# Patient Record
Sex: Female | Born: 1980 | Hispanic: No | Marital: Single | Smoking: Never smoker
Health system: Southern US, Community
[De-identification: ages and names within clinical notes are randomized; demographics above are authoritative.]

## PROBLEM LIST (undated history)

## (undated) DIAGNOSIS — K7460 Unspecified cirrhosis of liver: Secondary | ICD-10-CM

## (undated) DIAGNOSIS — F4310 Post-traumatic stress disorder, unspecified: Secondary | ICD-10-CM

## (undated) DIAGNOSIS — F1910 Other psychoactive substance abuse, uncomplicated: Secondary | ICD-10-CM

## (undated) DIAGNOSIS — F1010 Alcohol abuse, uncomplicated: Secondary | ICD-10-CM

## (undated) DIAGNOSIS — K649 Unspecified hemorrhoids: Secondary | ICD-10-CM

## (undated) DIAGNOSIS — D649 Anemia, unspecified: Secondary | ICD-10-CM

## (undated) DIAGNOSIS — B1910 Unspecified viral hepatitis B without hepatic coma: Secondary | ICD-10-CM

## (undated) DIAGNOSIS — K7031 Alcoholic cirrhosis of liver with ascites: Secondary | ICD-10-CM

## (undated) DIAGNOSIS — F419 Anxiety disorder, unspecified: Secondary | ICD-10-CM

## (undated) DIAGNOSIS — F329 Major depressive disorder, single episode, unspecified: Secondary | ICD-10-CM

## (undated) HISTORY — PX: CHOLECYSTECTOMY: SHX55

## (undated) HISTORY — PX: MOUTH SURGERY: SHX715

---

## 2003-04-12 ENCOUNTER — Ambulatory Visit (HOSPITAL_COMMUNITY): Admission: AD | Admit: 2003-04-12 | Discharge: 2003-04-12 | Payer: Self-pay | Admitting: Obstetrics and Gynecology

## 2003-06-21 ENCOUNTER — Inpatient Hospital Stay (HOSPITAL_COMMUNITY): Admission: RE | Admit: 2003-06-21 | Discharge: 2003-06-23 | Payer: Self-pay | Admitting: Obstetrics and Gynecology

## 2004-06-09 ENCOUNTER — Emergency Department (HOSPITAL_COMMUNITY): Admission: EM | Admit: 2004-06-09 | Discharge: 2004-06-09 | Payer: Self-pay | Admitting: Emergency Medicine

## 2006-09-13 ENCOUNTER — Inpatient Hospital Stay (HOSPITAL_COMMUNITY): Admission: AD | Admit: 2006-09-13 | Discharge: 2006-09-15 | Payer: Self-pay | Admitting: Obstetrics and Gynecology

## 2009-01-27 ENCOUNTER — Other Ambulatory Visit: Admission: RE | Admit: 2009-01-27 | Discharge: 2009-01-27 | Payer: Self-pay | Admitting: Unknown Physician Specialty

## 2009-01-27 ENCOUNTER — Encounter (INDEPENDENT_AMBULATORY_CARE_PROVIDER_SITE_OTHER): Payer: Self-pay | Admitting: Unknown Physician Specialty

## 2010-01-05 ENCOUNTER — Emergency Department (HOSPITAL_COMMUNITY): Admission: EM | Admit: 2010-01-05 | Discharge: 2010-01-05 | Payer: Self-pay | Admitting: Emergency Medicine

## 2010-01-06 ENCOUNTER — Ambulatory Visit: Payer: Self-pay | Admitting: Psychiatry

## 2010-01-06 ENCOUNTER — Inpatient Hospital Stay (HOSPITAL_COMMUNITY): Admission: RE | Admit: 2010-01-06 | Discharge: 2010-01-08 | Payer: Self-pay | Admitting: Psychiatry

## 2010-11-15 ENCOUNTER — Other Ambulatory Visit
Admission: RE | Admit: 2010-11-15 | Discharge: 2010-11-15 | Payer: Self-pay | Source: Home / Self Care | Admitting: Obstetrics and Gynecology

## 2010-11-15 ENCOUNTER — Other Ambulatory Visit: Payer: Self-pay | Admitting: Obstetrics and Gynecology

## 2011-01-23 LAB — URINALYSIS, ROUTINE W REFLEX MICROSCOPIC
Bilirubin Urine: NEGATIVE
Glucose, UA: NEGATIVE mg/dL
Ketones, ur: NEGATIVE mg/dL
Leukocytes, UA: NEGATIVE
Nitrite: NEGATIVE
Protein, ur: NEGATIVE mg/dL
Specific Gravity, Urine: 1.025 (ref 1.005–1.030)
Urobilinogen, UA: 0.2 mg/dL (ref 0.0–1.0)
pH: 6 (ref 5.0–8.0)

## 2011-01-23 LAB — CBC
Hemoglobin: 13.9 g/dL (ref 12.0–15.0)
MCHC: 36.7 g/dL — ABNORMAL HIGH (ref 30.0–36.0)
MCV: 91.2 fL (ref 78.0–100.0)
RBC: 4.16 MIL/uL (ref 3.87–5.11)
WBC: 6.7 10*3/uL (ref 4.0–10.5)

## 2011-01-23 LAB — RAPID URINE DRUG SCREEN, HOSP PERFORMED
Amphetamines: NOT DETECTED
Barbiturates: NOT DETECTED
Benzodiazepines: NOT DETECTED
Cocaine: NOT DETECTED
Opiates: NOT DETECTED
Tetrahydrocannabinol: NOT DETECTED

## 2011-01-23 LAB — URINE MICROSCOPIC-ADD ON

## 2011-01-23 LAB — DIFFERENTIAL
Basophils Relative: 1 % (ref 0–1)
Eosinophils Relative: 1 % (ref 0–5)
Lymphs Abs: 2.4 10*3/uL (ref 0.7–4.0)
Monocytes Absolute: 0.7 10*3/uL (ref 0.1–1.0)
Monocytes Relative: 11 % (ref 3–12)

## 2011-01-23 LAB — BASIC METABOLIC PANEL
CO2: 28 mEq/L (ref 19–32)
Chloride: 103 mEq/L (ref 96–112)
Creatinine, Ser: 0.58 mg/dL (ref 0.4–1.2)
Potassium: 3.4 mEq/L — ABNORMAL LOW (ref 3.5–5.1)
Sodium: 138 mEq/L (ref 135–145)

## 2011-01-23 LAB — PREGNANCY, URINE: Preg Test, Ur: NEGATIVE

## 2011-03-18 NOTE — Op Note (Signed)
NAMEMARIDEE, SLAPE               ACCOUNT NO.:  [id number]   MEDICAL RECORD NO.:  [id number]          PATIENT TYPE:  INP   LOCATION:  A402                          FACILITY:  [facility]   PHYSICIAN:  Lazaro Arms, M.D.   DATE OF BIRTH:  02/27/1981   DATE OF PROCEDURE:  09/14/2006  DATE OF DISCHARGE:  09/15/2006                               OPERATIVE REPORT   PROCEDURE:  Epidural placement note.   INDICATIONS:  Alcario Drought is a 30 year old, gravida 3, para 2, at [redacted] weeks  gestation who is admitted for induction of labor for post dates.  The  patient is getting good labor and is requesting an epidural be placed.   DESCRIPTION OF PROCEDURE:  The patient is placed in the sitting  position.  Betadine prep is used and the L3-4 interspace is identified.  Lidocaine 1% is injected.  The area is field draped.  A 17-gauge Tuohy  needle is used and loss-of-resistance technique employed.  The epidural  space is found with one pass without difficulty.  Bupivacaine plain  0.125% , 10 mL, is given into the epidural space as a test dose without  ill effects.  The epidural catheter was then fed without difficulty 10  mL of  0.125% bupivacaine plain is then given to dose up the epidural  and taped down 5 cm in the epidural space. The continuous infusion of  0.125% bupivacaine with 2 mcg per mL of fentanyl is begun at 12 mL per  hour.  The patient is getting good pain relief.  The fetal heart rate  tracing is stable and the blood pressure is stable.      Lazaro Arms, M.D.  Electronically Signed     LHE/MEDQ  D:  11/21/2006  T:  11/22/2006  Job:  [id number]

## 2011-03-18 NOTE — Group Therapy Note (Signed)
NAMEMYLAN, LENGYEL               ACCOUNT NO.:  [id number]   MEDICAL RECORD NO.:  [id number]          PATIENT TYPE:  INP   LOCATION:  LDR4                          FACILITY:  [facility]   PHYSICIAN:  Tilda Burrow, M.D. DATE OF BIRTH:  [birth year]   DATE OF PROCEDURE:  DATE OF DISCHARGE:                                   PROGRESS NOTE   DELIVERY NOTE:  Victoria Gordon progressed nicely through labor after she received her epidural, and  just complained of a tiny bit of pressure, so I checked her and she was  complete at +3 station.  After literally one push, she had a spontaneous  vaginal delivery of a viable female infant at 12:38 p.m.  The mouth and nose  were suctioned on the perineum and the shoulders delivered without  difficulty.  Apgars were 9/9.  Weight was 8 pounds, 10 ounces.  Twenty units  of Pitocin diluted in 1000 cc of lactated Ringer's was infused rapidly IV.  The placenta separated spontaneously and delivered via controlled cord  traction and maternal pushing effort at 12:42.  It was inspected and appears  to be intact with a three-vessel cord.  Minimal lochia was noted.  The  fundus was firm.  The vagina was inspected, and no lacerations were found.  Estimated blood loss 200 cc.  The epidural catheter was then removed with  the blue tip visualized as being intact.      Victoria Gordon, C.N.M.      Tilda Burrow, M.D.  Electronically Signed    FC/MEDQ  D:  09/14/2006  T:  09/14/2006  Job:  [id number]   cc:   [facility]   Francoise Schaumann. Milford Cage DO, FAAP  Fax: [phone]

## 2011-03-18 NOTE — H&P (Signed)
NAMELIDDY, DEAM               ACCOUNT NO.:  [id number]   MEDICAL RECORD NO.:  [id number]          PATIENT TYPE:  INP   LOCATION:  A402                          FACILITY:  [facility]   PHYSICIAN:  Tilda Burrow, M.D. DATE OF BIRTH:  [birth year]   DATE OF ADMISSION:  DATE OF DISCHARGE:  LH                                HISTORY & PHYSICAL   CHIEF COMPLAINT:  Induction of labor secondary to post dates.   HISTORY OF PRESENT ILLNESS:  Victoria Gordon is a gravida 3, para 2 with an EDC of  September 07, 2006 based on last menstrual period and correlating 18-week  ultrasound.  She began prenatal care in her second trimester and has had  regular visits since then.  She has gained a total of 40 pounds with  appropriate fundal height growth.  Blood pressures have been 100s to  130s/50s to 80s.  Prenatal labs include blood type O positive, rubella  immune.  HBsAg, HIV, HSV, RPR, gonorrhea, Chlamydia and Group B strep were  all negative.  A one hour GTT was elevated at 148 with a subsequently normal  3 hour GTT.   PAST MEDICAL HISTORY:  None.   PAST SURGICAL HISTORY:  No surgical history.   ALLERGIES:  NO KNOWN DRUG ALLERGIES.   SOCIAL HISTORY:  Single.  Father of the baby is involved.  Denies cigarette  smoking, alcohol or drug use.   FAMILY HISTORY:  No contributing family history.   OBSTETRICAL HISTORY:  Had induced vaginal deliveries in 2001 and 2004 of  female infants around 7 pounds each without difficulty.   PHYSICAL EXAMINATION:  HEENT: Within normal limits.  HEART:  Regular rate and rhythm.  LUNGS:  Clear.  ABDOMEN:  Soft and nontender.  Fundal height is 41 cm.  Estimated fetal  weight about 8 pounds.  Cervical examination-2 cm dilated, 50% effaced, -1  to -2 station and posterior.  Legs are negative.   IMPRESSION:  IUP at 41 weeks, induction of labor secondary to post dates.   PLAN:  Will admit on the evening of September 13, 2006 for a Foley pre  induction and AROM and Pitocin in the  morning.      Jacklyn Shell, C.N.M.      Tilda Burrow, M.D.  Electronically Signed    FC/MEDQ  D:  09/12/2006  T:  09/12/2006  Job:  [id number]   cc:   Francoise Schaumann. Milford Cage DO, FAAP  Fax: [phone]

## 2011-03-18 NOTE — Op Note (Signed)
   NAMEGerald Leitz                         ACCOUNT NO.:  [id number]   MEDICAL RECORD NO.:  [id number]                   PATIENT TYPE:  INP   LOCATION:  LDR2                                 FACILITY:  [facility]   PHYSICIAN:  Tilda Burrow, M.D.              DATE OF BIRTH:  September 23, 1981   DATE OF PROCEDURE:  DATE OF DISCHARGE:                                 OPERATIVE REPORT   DELIVERY TIME:  1418, 2004, on August 21.   PROCEDURE:  [name] progressed after beginning induction at 6 a.m.,  progressing nicely to 4 cm dilated at which time amniotomy was performed.  She progressed smoothly to spontaneous vertex vaginal delivery.  At 2:18  p.m. from direct OA position, a healthy female infant, Apgars 9 and 9,  weight 7 pounds 4 ounce.  Placenta delivered easily, Schultze presentation.  Median second-degree laceration was repaired under local anesthesia.  The  patient tolerated the procedure well.                                               Tilda Burrow, M.D.    JVF/MEDQ  D:  06/21/2003  T:  06/21/2003  Job:  [id number]   cc:   [facility]   Gardiner Barefoot, M.D.  [address]  [place]  [place] [address]  Fax: [phone]

## 2011-03-18 NOTE — H&P (Signed)
   NAMEStevie Gordon                           ACCOUNT NO.:  [id number]   MEDICAL RECORD NO.:  [id number]                   PATIENT TYPE:   LOCATION:                                       FACILITY:   PHYSICIAN:  Tilda Burrow, M.D.              DATE OF BIRTH:   DATE OF ADMISSION:  DATE OF DISCHARGE:                                HISTORY & PHYSICAL   ADMITTING DIAGNOSIS:  Pregnancy [redacted] weeks gestation, elective induction.   HISTORY OF PRESENT ILLNESS:  This 30 year old female, gravida 3, para 1, AB  1, LMP September 14, 2002 placing menstrual EDC of June 22, 2003 with first  ultrasound suggesting an [facility] of June 22, 2003, as well, based on a midterm  menstrual ultrasound is admitted after a pregnancy followed through our  office after a late presentation May 21 which she was 26 weeks 4 days by  gestational criteria. She is admitted, at this time, for induction with  cervix 3 cm, soft, minus 2, posterior position. Membranes have been ruptured  at 9:30 a.m.  Potential for vaginal delivery is considered good.   PRIOR OBSTETRICAL HISTORY:  A 3 hour labor from induction after a Foley bulb  cervical ripening.   PAST MEDICAL HISTORY:  Benign.   PAST SURGICAL HISTORY:  D&C after spontaneous AB.   ALLERGIES:  None.   MEDICATIONS:  Prenatal vitamins.   SOCIAL HISTORY:  Single, lives with the baby's father a Mr. Victoria Gordon who  is having his first child.   PHYSICAL EXAMINATION:  GENERAL: Reveals a healthy appearing, Hispanic  female, alert, oriented x3 with good [place] skills.  VITAL SIGNS:  Height 5 feet 4 inches; weight 202.  Blood pressure 120/80.  ABDOMEN:  Fundal height shows a term size fetus, vertex presentation,  estimated fetal weight 7 pounds.  Cervix 3 cm, 50%, minus 2, posterior  vertex presentation confirmed.  Amniotomy shows light bloody fluid.   PLAN:  Pitocin induction of labor with good prognosis for vaginal delivery.              Tilda Burrow, M.D.    JVF/MEDQ  D:  06/21/2003  T:  06/21/2003  Job:  [id number]   cc:   [facility] Department   Victoria Gordon, M.D.  Victoria Gordon. [address]  [place]  [place] [address]  Fax: [phone]

## 2011-04-20 ENCOUNTER — Encounter (HOSPITAL_COMMUNITY): Payer: Self-pay | Admitting: *Deleted

## 2011-04-20 ENCOUNTER — Inpatient Hospital Stay (HOSPITAL_COMMUNITY)
Admission: AD | Admit: 2011-04-20 | Payer: Medicaid Other | Source: Ambulatory Visit | Admitting: Obstetrics & Gynecology

## 2011-04-20 ENCOUNTER — Observation Stay (HOSPITAL_COMMUNITY)
Admission: AD | Admit: 2011-04-20 | Discharge: 2011-04-20 | Disposition: A | Discharge status: Home / Self Care | Payer: Medicaid Other | Source: Ambulatory Visit | Attending: Obstetrics & Gynecology | Admitting: Obstetrics & Gynecology

## 2011-04-20 DIAGNOSIS — O321XX0 Maternal care for breech presentation, not applicable or unspecified: Principal | ICD-10-CM | POA: Insufficient documentation

## 2011-04-22 ENCOUNTER — Encounter (HOSPITAL_COMMUNITY)
Admission: RE | Admit: 2011-04-22 | Discharge: 2011-04-22 | Disposition: A | Discharge status: Home / Self Care | Payer: Medicaid Other | Source: Ambulatory Visit | Attending: Internal Medicine | Admitting: Internal Medicine

## 2011-04-22 LAB — CBC
HCT: 33 % — ABNORMAL LOW (ref 36.0–46.0)
Hemoglobin: 10.8 g/dL — ABNORMAL LOW (ref 12.0–15.0)
MCHC: 32.7 g/dL (ref 30.0–36.0)
MCV: 84.6 fL (ref 78.0–100.0)
WBC: 8.6 10*3/uL (ref 4.0–10.5)

## 2011-04-22 LAB — SURGICAL PCR SCREEN: Staphylococcus aureus: NEGATIVE

## 2011-04-22 LAB — RPR: RPR Ser Ql: NONREACTIVE

## 2011-04-24 ENCOUNTER — Other Ambulatory Visit: Payer: Self-pay | Admitting: Obstetrics & Gynecology

## 2011-04-24 ENCOUNTER — Inpatient Hospital Stay (HOSPITAL_COMMUNITY)
Admission: RE | Admit: 2011-04-24 | Discharge: 2011-04-26 | DRG: 766 | Disposition: A | Discharge status: Home / Self Care | Payer: Medicaid Other | Source: Ambulatory Visit | Attending: Obstetrics & Gynecology | Admitting: Obstetrics & Gynecology

## 2011-04-24 DIAGNOSIS — O321XX0 Maternal care for breech presentation, not applicable or unspecified: Principal | ICD-10-CM | POA: Diagnosis present

## 2011-04-24 DIAGNOSIS — Z01818 Encounter for other preprocedural examination: Secondary | ICD-10-CM

## 2011-04-24 DIAGNOSIS — O9903 Anemia complicating the puerperium: Secondary | ICD-10-CM | POA: Diagnosis not present

## 2011-04-24 DIAGNOSIS — D649 Anemia, unspecified: Secondary | ICD-10-CM | POA: Diagnosis not present

## 2011-04-24 DIAGNOSIS — Z01812 Encounter for preprocedural laboratory examination: Secondary | ICD-10-CM

## 2011-04-24 LAB — CBC
HCT: 32.3 % — ABNORMAL LOW (ref 36.0–46.0)
Hemoglobin: 10.8 g/dL — ABNORMAL LOW (ref 12.0–15.0)
RBC: 3.85 MIL/uL — ABNORMAL LOW (ref 3.87–5.11)

## 2011-04-24 LAB — RPR: RPR Ser Ql: NONREACTIVE

## 2011-04-25 LAB — CBC
HCT: 26.5 % — ABNORMAL LOW (ref 36.0–46.0)
Hemoglobin: 8.8 g/dL — ABNORMAL LOW (ref 12.0–15.0)
MCV: 84.1 fL (ref 78.0–100.0)
WBC: 10.1 10*3/uL (ref 4.0–10.5)

## 2011-04-27 NOTE — Op Note (Addendum)
Victoria Gordon, Victoria Gordon               ACCOUNT NO.:  [id number]  MEDICAL RECORD NO.:  [id number]  LOCATION:                                 FACILITY:  PHYSICIAN:  Lazaro Arms, M.D.   DATE OF BIRTH:  11-Dec-1980  DATE OF PROCEDURE:  04/24/2011 DATE OF DISCHARGE:                              OPERATIVE REPORT   PREOPERATIVE DIAGNOSES: 1. Intrauterine pregnancy 39 weeks' gestation. 2. Breech/back anterior transverse lie. 3. Failed version attempt earlier in the week. 4. Spontaneous rupture of membranes.  POSTOPERATIVE DIAGNOSES: 1. Intrauterine pregnancy 39 weeks' gestation. 2. Breech/back anterior transverse lie. 3. Failed version attempt earlier in the week. 4. Spontaneous rupture of membranes.  PROCEDURE:  Primary low transverse cesarean resection.  SURGEON:  Lazaro Arms, MD  ANESTHESIA:  Spinal.  FINDINGS:  The patient came to the ER Maternity Admission Unit today with spontaneous rupture of membranes to the external cephalic version attempt last Wednesday on the 20th which was unsuccessful and scheduled her for C-section on the 27th, however, she presented today for rupture of membranes which was confirmed. Today on ultrasound the baby is actually back anterior, transverse lie.  At the time of surgery that was confirmed.  We delivered a viable female infant at 67, 13 with Apgar's of 8 and 9 with cord pH 7.30, weight to be determined at nursery.  Three-vessel cord.  Cord blood was normal. Uterus, tubes and ovaries normal.  DESCRIPTION OF OPERATION:  The patient was taken to the operating room, placed in sitting position where she underwent spinal anesthetic.  She was then placed in supine position with tilt to the left.  She was prepped and draped in usual sterile fashion.  Pfannenstiel's skin incision was made, carried down sharply to rectus fascia, scored in midline, extended laterally.  The fascia was taken off the muscles superiorly and inferiorly without  difficulty.  The muscles were divided. Peritoneal cavity was entered.  Bladder blade was placed.  Low- transverse hysterotomy incision was made and over this incision was delivered a viable female infant at 55, 22 with Apgar's of 8 and 9, weight to be determined at nursery.  Cord pH 7.30. Estimated blood loss was 1000 mL.  The infant underwent routine neonatal resuscitation by Dr. Eric Form.  The uterus was exteriorized, closed in 2 layers, first being running interlocking layer, the second being imbricating layer and additional interrupted figure-of-eight sutures.  Has large venous complex in the area of the incision.  There was quite heavy bleeding at this point, so the blood loss was 1000 mL but there was good hemostasis at the closure.  The muscles and peritoneum reapproximated loosely.  The fascia was closed using 0 Vicryl running.  The subcutaneous tissue was made hemostatic and irrigated.  Skin was closed using 4-0 Vicryl in a Keith needle subcuticular fashion and Dermabond. The patient tolerated the procedure well.  She experienced 1000 mL of blood loss, taken to recovery room in good stable condition.  All counts correct.  She received Ancef prophylactically.     Lazaro Arms, M.D.     Loraine Maple  D:  04/24/2011  T:  04/25/2011  Job:  [id number]  Electronically  Signed by Duane Lope M.D. on 05/03/2011 10:29:30 AM

## 2011-05-06 NOTE — H&P (Signed)
  NAMEJNIYA, Victoria Gordon               ACCOUNT NO.:  [id number]  MEDICAL RECORD NO.:  [id number]  LOCATION:  9172                          FACILITY:  [facility]  PHYSICIAN:  Lazaro Arms, M.D.   DATE OF BIRTH:  12/16/1980  DATE OF ADMISSION:  04/20/2011 DATE OF DISCHARGE:  04/20/2011                             HISTORY & PHYSICAL   [name] is a gravida 4, para 3, at [redacted] weeks gestation, who was found to be in the breech presentation in the office.  She is brought in to the hospital to undergo an external cephalic version.  The patient understands the risk of need for emergency C-section and she is being kept n.p.o. for the past 8 hours.  She has a reactive NST pre procedure. She is still in the breech position that is confirmed.  It really is a back anterior head up oblique lie with a head up under the liver, the back anterior, and the butt is in the left lower quadrant.  The patient was given two doses of terbutaline to relax the uterus.  Her pulse is over 100.  Using the ultrasound and a lot of ultrasound jelly, I manually attempted x3 to rotate the baby without success.  Fetal heart rate was good in between these ultrasound to confirm that after three attempts ceased and placed the patient back on the monitor for an hour and a half monitoring.  Monitoring show was completely reactive with no variable decelerations, no decelerations at all, and the patient felt well.  As a result, she was discharged home to follow up in the office. I put her on for a C-section on Wednesday, June 27 for C-section because of persistent non-vertex presentation.  The patient and her husband understand and all arrangements were made and she will be followed up in the office on Friday.     Lazaro Arms, M.D.     Loraine Maple  D:  05/04/2011  T:  05/05/2011  Job:  [id number]  Electronically Signed by Duane Lope M.D. on 05/06/2011 12:23:29 PM

## 2011-05-10 NOTE — Discharge Summary (Signed)
  Victoria Gordon, Victoria Gordon               ACCOUNT NO.:  [id number]  MEDICAL RECORD NO.:  [id number]  LOCATION:  9146                          FACILITY:  [facility]  PHYSICIAN:  Scheryl Darter, MD       DATE OF BIRTH:  02/04/1981  DATE OF ADMISSION:  04/24/2011 DATE OF DISCHARGE:  04/26/2011                              DISCHARGE SUMMARY   ADMITTING DIAGNOSES: 1. Intrauterine pregnancy at 38-6/7 weeks. 2. Spontaneous rupture of membranes. 3. Breech/transverse lie.  DISCHARGE DIAGNOSES: 1. Intrauterine pregnancy at 38-6/7 weeks. 2. Spontaneous rupture of membranes. 3. Breech/transverse lie. 4. Anemia.  PROCEDURE:  Primary low transverse cesarean section.  HOSPITAL COURSE:  Victoria Gordon is a 30 year old gravida 4, para 3-0-3-3 who presented at 38-6/[redacted] weeks gestation with spontaneous rupture of membranes and breech/transverse lie of the fetus.  Her pregnancy has been followed by the [facility].  She has had an essentially uneventful pregnancy course with 3 vaginal deliveries in the past.  The patient was taken to the operating room for cesarean section by Dr. Despina Hidden.  Infant was a viable female born at 65 on April 24, 2011, with Apgars of 8 and 9, weight of 7 pounds 12 ounces and cord pH of 7.30 and he was taken to the full-term nursery in good condition. The patient tolerated the remainder of the cesarean section well and was taken to the recovery room.  By postop day #1, the patient was doing well.  Her vital signs were stable.  Her hemoglobin was 8.8 and hematocrit was 26.5, white blood cell count was 10.1 and platelets were 172,000.  Preoperatively, her hemoglobin was 10.8, hematocrit 32.12, white blood cell count 10.2 with platelets 225,000. The patient was having no signs and symptoms of anemia.  She was bottle feeding and desired Implanon for contraception.  By postop day #2, she continued to do well.  Physical exam was within normal limits.  Vital signs were stable.  She  was deemed to have received the full benefit of her hospital stay and she was discharged home.  DISCHARGE INSTRUCTIONS:  Per postpartum handout.  DISCHARGE MEDICATIONS: 1. Motrin 600 mg one p.o. q.6 h p.r.n. pain. 2. Percocet 5/325 one tablet every 4 hours p.r.n. severe pain. 3. Prenatal vitamin one p.o. day.  DISCHARGE FOLLOWUP:  It will occur at [facility] in 4-6 weeks, postpartum or sooner as needed.     Cam Hai, C.N.M.   ______________________________ Scheryl Darter, MD    KS/MEDQ  D:  04/26/2011  T:  04/26/2011  Job:  [id number]  Electronically Signed by Cam Hai C.N.M. on 04/28/2011 01:13:46 AM Electronically Signed by Scheryl Darter MD on 05/10/2011 12:31:33 PM

## 2013-07-11 ENCOUNTER — Other Ambulatory Visit: Payer: Self-pay

## 2013-07-15 ENCOUNTER — Other Ambulatory Visit: Payer: Self-pay | Admitting: Obstetrics & Gynecology

## 2013-07-15 ENCOUNTER — Ambulatory Visit (INDEPENDENT_AMBULATORY_CARE_PROVIDER_SITE_OTHER): Payer: BC Managed Care – PPO

## 2013-07-15 ENCOUNTER — Encounter: Payer: Self-pay | Admitting: Obstetrics & Gynecology

## 2013-07-15 DIAGNOSIS — O09299 Supervision of pregnancy with other poor reproductive or obstetric history, unspecified trimester: Secondary | ICD-10-CM

## 2013-07-15 DIAGNOSIS — O3680X0 Pregnancy with inconclusive fetal viability, not applicable or unspecified: Secondary | ICD-10-CM

## 2013-07-15 DIAGNOSIS — O26849 Uterine size-date discrepancy, unspecified trimester: Secondary | ICD-10-CM

## 2013-07-15 NOTE — Progress Notes (Signed)
U/S-transvaginal and transabdominal u/s performed, single intrauterine gestational sac noted with +YS, no fetal pole noted today, GS meas c/w 6+2wks, bilateral adnexa wnl with C.L. Noted on RT, no free fluid noted, cx long and closed, reviewed with Cyril Mourning, NP she recommended to perform a follow up u/s in 1 week for viability.

## 2013-07-16 ENCOUNTER — Other Ambulatory Visit: Payer: Self-pay | Admitting: Obstetrics & Gynecology

## 2013-07-16 DIAGNOSIS — O3680X0 Pregnancy with inconclusive fetal viability, not applicable or unspecified: Secondary | ICD-10-CM

## 2013-07-23 ENCOUNTER — Other Ambulatory Visit: Payer: Self-pay

## 2013-07-30 ENCOUNTER — Ambulatory Visit (INDEPENDENT_AMBULATORY_CARE_PROVIDER_SITE_OTHER): Payer: BC Managed Care – PPO | Admitting: Obstetrics & Gynecology

## 2013-07-30 ENCOUNTER — Ambulatory Visit (INDEPENDENT_AMBULATORY_CARE_PROVIDER_SITE_OTHER): Payer: BC Managed Care – PPO

## 2013-07-30 ENCOUNTER — Encounter: Payer: Self-pay | Admitting: Obstetrics & Gynecology

## 2013-07-30 ENCOUNTER — Other Ambulatory Visit: Payer: Self-pay | Admitting: Obstetrics & Gynecology

## 2013-07-30 VITALS — BP 130/80 | Wt 182.0 lb

## 2013-07-30 VITALS — Ht 62.0 in

## 2013-07-30 DIAGNOSIS — O021 Missed abortion: Secondary | ICD-10-CM | POA: Insufficient documentation

## 2013-07-30 DIAGNOSIS — Z331 Pregnant state, incidental: Secondary | ICD-10-CM

## 2013-07-30 DIAGNOSIS — O200 Threatened abortion: Secondary | ICD-10-CM

## 2013-07-30 DIAGNOSIS — O3680X0 Pregnancy with inconclusive fetal viability, not applicable or unspecified: Secondary | ICD-10-CM

## 2013-07-30 DIAGNOSIS — Z1389 Encounter for screening for other disorder: Secondary | ICD-10-CM

## 2013-07-30 LAB — POCT URINALYSIS DIPSTICK
Glucose, UA: NEGATIVE
Ketones, UA: NEGATIVE

## 2013-07-30 NOTE — Progress Notes (Signed)
Patient ID: Victoria Gordon, female   DOB: [birth year], 32 y.o.   MRN: [id number] Victoria Gordon comes in for a followup after her sonogram today She had an ultrasound on September 15 which revealed an intrauterine pregnancy with the otic sac but was inconsistent with her dates as a result we saw her back for a repeat sonogram today The ultrasound today actually has a smaller sac in an no progression of the pregnancy at all  As a result this represents a missed miscarriage It is a small sac and a D&C or Cytotec or needed  She's been having some spotting since last Friday not any heavy bleeding

## 2013-10-31 NOTE — L&D Delivery Note (Signed)
I was present for the entire delivery and repair and agree with above. Placenta to: BS Feeding: Br Circ: NA Contraception: BLT NPO  [place], CNM 07/22/2014 10:59 AM

## 2013-10-31 NOTE — L&D Delivery Note (Signed)
Delivery Note At 9:50 AM a viable and healthy female was delivered via Vaginal, Spontaneous Delivery (Presentation: Left Occiput Anterior).  APGAR: 9, 9; weight pending post skin to skin .   Placenta status:  Delivered Spontaneous shultz.  Cord: 3 vessels with the following complications: None.  Anesthesia: Epidural  Episiotomy:  Lacerations: 1st degree;Vaginal Suture Repair: 3.0 vicryl Est. Blood Loss (mL): 300  Mom to postpartum.  Baby to Couplet care / Skin to Skin.  Doylene Bode [facility] 07/22/2014, 10:44 AM

## 2014-01-07 ENCOUNTER — Other Ambulatory Visit: Payer: Self-pay | Admitting: Obstetrics and Gynecology

## 2014-01-07 DIAGNOSIS — O3680X0 Pregnancy with inconclusive fetal viability, not applicable or unspecified: Secondary | ICD-10-CM

## 2014-01-09 ENCOUNTER — Other Ambulatory Visit: Payer: Self-pay | Admitting: Obstetrics & Gynecology

## 2014-01-09 ENCOUNTER — Other Ambulatory Visit: Payer: BC Managed Care – PPO

## 2014-01-09 ENCOUNTER — Ambulatory Visit (INDEPENDENT_AMBULATORY_CARE_PROVIDER_SITE_OTHER): Payer: BC Managed Care – PPO

## 2014-01-09 ENCOUNTER — Encounter (INDEPENDENT_AMBULATORY_CARE_PROVIDER_SITE_OTHER): Payer: Self-pay

## 2014-01-09 DIAGNOSIS — Z3480 Encounter for supervision of other normal pregnancy, unspecified trimester: Secondary | ICD-10-CM

## 2014-01-09 DIAGNOSIS — Z36 Encounter for antenatal screening of mother: Secondary | ICD-10-CM

## 2014-01-09 DIAGNOSIS — O3680X0 Pregnancy with inconclusive fetal viability, not applicable or unspecified: Secondary | ICD-10-CM

## 2014-01-09 NOTE — Progress Notes (Signed)
U/S - 13+2wks- single active fetus, CRL c/w LMP dates, FHR-149 bpm, cx appears closed, bilateral adnexa appears WNL, posterior placenta noted, pt desires NT/IT screen, NB present, NT-1.76mm

## 2014-01-17 LAB — MATERNAL SCREEN, INTEGRATED #1

## 2014-01-21 ENCOUNTER — Encounter: Payer: BC Managed Care – PPO | Admitting: Adult Health

## 2014-02-04 ENCOUNTER — Encounter: Payer: BC Managed Care – PPO | Admitting: Adult Health

## 2014-02-17 ENCOUNTER — Encounter: Payer: Self-pay | Admitting: Women's Health

## 2014-02-17 ENCOUNTER — Ambulatory Visit (INDEPENDENT_AMBULATORY_CARE_PROVIDER_SITE_OTHER): Payer: BC Managed Care – PPO | Admitting: Women's Health

## 2014-02-17 ENCOUNTER — Other Ambulatory Visit (HOSPITAL_COMMUNITY)
Admission: RE | Admit: 2014-02-17 | Discharge: 2014-02-17 | Disposition: A | Discharge status: Home / Self Care | Payer: BC Managed Care – PPO | Source: Ambulatory Visit | Attending: Adult Health | Admitting: Adult Health

## 2014-02-17 VITALS — BP 110/70 | Wt 206.0 lb

## 2014-02-17 DIAGNOSIS — O99210 Obesity complicating pregnancy, unspecified trimester: Secondary | ICD-10-CM

## 2014-02-17 DIAGNOSIS — Z113 Encounter for screening for infections with a predominantly sexual mode of transmission: Secondary | ICD-10-CM | POA: Insufficient documentation

## 2014-02-17 DIAGNOSIS — O99019 Anemia complicating pregnancy, unspecified trimester: Secondary | ICD-10-CM

## 2014-02-17 DIAGNOSIS — E669 Obesity, unspecified: Secondary | ICD-10-CM

## 2014-02-17 DIAGNOSIS — O09299 Supervision of pregnancy with other poor reproductive or obstetric history, unspecified trimester: Secondary | ICD-10-CM

## 2014-02-17 DIAGNOSIS — O2342 Unspecified infection of urinary tract in pregnancy, second trimester: Secondary | ICD-10-CM | POA: Insufficient documentation

## 2014-02-17 DIAGNOSIS — Z3480 Encounter for supervision of other normal pregnancy, unspecified trimester: Secondary | ICD-10-CM | POA: Insufficient documentation

## 2014-02-17 DIAGNOSIS — Z331 Pregnant state, incidental: Secondary | ICD-10-CM

## 2014-02-17 DIAGNOSIS — Z1151 Encounter for screening for human papillomavirus (HPV): Secondary | ICD-10-CM | POA: Insufficient documentation

## 2014-02-17 DIAGNOSIS — O2390 Unspecified genitourinary tract infection in pregnancy, unspecified trimester: Secondary | ICD-10-CM

## 2014-02-17 DIAGNOSIS — Z01419 Encounter for gynecological examination (general) (routine) without abnormal findings: Secondary | ICD-10-CM | POA: Insufficient documentation

## 2014-02-17 DIAGNOSIS — O0930 Supervision of pregnancy with insufficient antenatal care, unspecified trimester: Secondary | ICD-10-CM

## 2014-02-17 DIAGNOSIS — Z1389 Encounter for screening for other disorder: Secondary | ICD-10-CM

## 2014-02-17 DIAGNOSIS — O34219 Maternal care for unspecified type scar from previous cesarean delivery: Secondary | ICD-10-CM | POA: Insufficient documentation

## 2014-02-17 LAB — POCT URINALYSIS DIPSTICK
Glucose, UA: NEGATIVE
KETONES UA: NEGATIVE
Leukocytes, UA: NEGATIVE
Nitrite, UA: POSITIVE
PROTEIN UA: NEGATIVE

## 2014-02-17 LAB — CBC
HCT: 32 % — ABNORMAL LOW (ref 36.0–46.0)
Hemoglobin: 11.4 g/dL — ABNORMAL LOW (ref 12.0–15.0)
MCH: 32.9 pg (ref 26.0–34.0)
MCHC: 35.6 g/dL (ref 30.0–36.0)
MCV: 92.2 fL (ref 78.0–100.0)
PLATELETS: 225 10*3/uL (ref 150–400)
RBC: 3.47 MIL/uL — ABNORMAL LOW (ref 3.87–5.11)
RDW: 13.6 % (ref 11.5–15.5)
WBC: 9.8 10*3/uL (ref 4.0–10.5)

## 2014-02-17 MED ORDER — NITROFURANTOIN MONOHYD MACRO 100 MG PO CAPS: 100.0000 mg | ORAL_CAPSULE | Freq: Two times a day (BID) | ORAL | Status: DC

## 2014-02-17 NOTE — Patient Instructions (Signed)
Second Trimester of Pregnancy The second trimester is from week 13 through week 28, months 4 through 6. The second trimester is often a time when you feel your best. Your body has also adjusted to being pregnant, and you begin to feel better physically. Usually, morning sickness has lessened or quit completely, you may have more energy, and you may have an increase in appetite. The second trimester is also a time when the fetus is growing rapidly. At the end of the sixth month, the fetus is about 9 inches long and weighs about 1 pounds. You will likely begin to feel the baby move (quickening) between 18 and 20 weeks of the pregnancy. BODY CHANGES Your body goes through many changes during pregnancy. The changes vary from woman to woman.   Your weight will continue to increase. You will notice your lower abdomen bulging out.  You may begin to get stretch marks on your hips, abdomen, and breasts.  You may develop headaches that can be relieved by medicines approved by your caregiver.  You may urinate more often because the fetus is pressing on your bladder.  You may develop or continue to have heartburn as a result of your pregnancy.  You may develop constipation because certain hormones are causing the muscles that push waste through your intestines to slow down.  You may develop hemorrhoids or swollen, bulging veins (varicose veins).  You may have back pain because of the weight gain and pregnancy hormones relaxing your joints between the bones in your pelvis and as a result of a shift in weight and the muscles that support your balance.  Your breasts will continue to grow and be tender.  Your gums may bleed and may be sensitive to brushing and flossing.  Dark spots or blotches (chloasma, mask of pregnancy) may develop on your face. This will likely fade after the baby is born.  A dark line from your belly button to the pubic area (linea nigra) may appear. This will likely fade after the  baby is born. WHAT TO EXPECT AT YOUR PRENATAL VISITS During a routine prenatal visit:  You will be weighed to make sure you and the fetus are growing normally.  Your blood pressure will be taken.  Your abdomen will be measured to track your baby's growth.  The fetal heartbeat will be listened to.  Any test results from the previous visit will be discussed. Your caregiver may ask you:  How you are feeling.  If you are feeling the baby move.  If you have had any abnormal symptoms, such as leaking fluid, bleeding, severe headaches, or abdominal cramping.  If you have any questions. Other tests that may be performed during your second trimester include:  Blood tests that check for:  Low iron levels (anemia).  Gestational diabetes (between 24 and 28 weeks).  Rh antibodies.  Urine tests to check for infections, diabetes, or protein in the urine.  An ultrasound to confirm the proper growth and development of the baby.  An amniocentesis to check for possible genetic problems.  Fetal screens for spina bifida and Down syndrome. HOME CARE INSTRUCTIONS   Avoid all smoking, herbs, alcohol, and unprescribed drugs. These chemicals affect the formation and growth of the baby.  Follow your caregiver's instructions regarding medicine use. There are medicines that are either safe or unsafe to take during pregnancy.  Exercise only as directed by your caregiver. Experiencing uterine cramps is a good sign to stop exercising.  Continue to eat regular,   healthy meals.  Wear a good support bra for breast tenderness.  Do not use hot tubs, steam rooms, or saunas.  Wear your seat belt at all times when driving.  Avoid raw meat, uncooked cheese, cat litter boxes, and soil used by cats. These carry germs that can cause birth defects in the baby.  Take your prenatal vitamins.  Try taking a stool softener (if your caregiver approves) if you develop constipation. Eat more high-fiber foods,  such as fresh vegetables or fruit and whole grains. Drink plenty of fluids to keep your urine clear or pale yellow.  Take warm sitz baths to soothe any pain or discomfort caused by hemorrhoids. Use hemorrhoid cream if your caregiver approves.  If you develop varicose veins, wear support hose. Elevate your feet for 15 minutes, 3 4 times a day. Limit salt in your diet.  Avoid heavy lifting, wear low heel shoes, and practice good posture.  Rest with your legs elevated if you have leg cramps or low back pain.  Visit your dentist if you have not gone yet during your pregnancy. Use a soft toothbrush to brush your teeth and be gentle when you floss.  A sexual relationship may be continued unless your caregiver directs you otherwise.  Continue to go to all your prenatal visits as directed by your caregiver. SEEK MEDICAL CARE IF:   You have dizziness.  You have mild pelvic cramps, pelvic pressure, or nagging pain in the abdominal area.  You have persistent nausea, vomiting, or diarrhea.  You have a bad smelling vaginal discharge.  You have pain with urination. SEEK IMMEDIATE MEDICAL CARE IF:   You have a fever.  You are leaking fluid from your vagina.  You have spotting or bleeding from your vagina.  You have severe abdominal cramping or pain.  You have rapid weight gain or loss.  You have shortness of breath with chest pain.  You notice sudden or extreme swelling of your face, hands, ankles, feet, or legs.  You have not felt your baby move in over an hour.  You have severe headaches that do not go away with medicine.  You have vision changes. Document Released: 10/11/2001 Document Revised: 06/19/2013 Document Reviewed: 12/18/2012 [employer] Patient Information 2014 [employer].  

## 2014-02-17 NOTE — Progress Notes (Signed)
  Subjective:  Victoria Gordon is a 33 y.o. [phone] Hispanic female at [redacted]w[redacted]d by LMP c/w 13wk u/s, being seen today for her first obstetrical visit.  Her obstetrical history is significant for obesity, term uncomplicated SVD x 3, then PLTCS w/ last baby d/t transverse presentation w/ failed ECV.  Pregnancy history fully reviewed.  Patient reports no complaints. Denies vb, cramping, uti s/s, abnormal/malodorous vag d/c, or vulvovaginal itching/irritation.  BP 110/70  Wt 206 lb (93.441 kg)  LMP 10/08/2013  HISTORY: OB History  Gravida Para Term Preterm AB SAB TAB Ectopic Multiple Living  7 4 4  0 2 2 0 0 0 4    # Outcome Date GA Lbr Len/2nd Weight Sex Delivery Anes PTL Lv  7 CUR           6 SAB 07/2013          5 TRM 04/24/11 [redacted]w[redacted]d  8 lb (3.629 kg) M LTCS EPI  Y  4 TRM 09/14/06 [redacted]w[redacted]d  7 lb 4 oz (3.289 kg) F SVD EPI  Y  3 TRM 06/21/03 [redacted]w[redacted]d  7 lb 12 oz (3.515 kg) F SVD EPI  Y  2 TRM 12/28/99 [redacted]w[redacted]d  7 lb (3.175 kg) F SVD None  Y  1 SAB 2000             History reviewed. No pertinent past medical history. Past Surgical History  Procedure Laterality Date  . Cesarean section    . Mouth surgery     History reviewed. No pertinent family history.  Exam   System:     General: Well developed & nourished, no acute distress   Skin: Warm & dry, normal coloration and turgor, no rashes   Neurologic: Alert & oriented, normal mood   Cardiovascular: Regular rate & rhythm   Respiratory: Effort & rate normal, LCTAB, acyanotic   Abdomen: Soft, non tender   Extremities: normal strength, tone   Pelvic Exam:    Perineum: Normal perineum   Vulva: Normal, no lesions   Vagina:  Normal mucosa, normal discharge   Cervix: Normal, bulbous, appears closed   Uterus: Normal size/shape/contour for GA   Thin prep pap smear obtained w/ high risk HPV cotesting FHR: 144 via doppler   Assessment:   Pregnancy: [id number] Patient Active Problem List   Diagnosis Date Noted  . Supervision of other  normal pregnancy 02/17/2014    Priority: High  . Previous cesarean delivery affecting pregnancy, antepartum 02/17/2014    Priority: High  . Missed abortion 07/30/2013    [redacted]w[redacted]d [id number] New OB visit Obesity Term SVD x 3 Previous c/s w/ 4th baby d/t transverse position/failed ECV    Plan:  Initial labs drawn Continue prenatal vitamins Problem list reviewed and updated Reviewed n/v relief measures and warning s/s to report Reviewed recommended weight gain based on pre-gravid BMI Encouraged well-balanced diet Genetic Screening discussed Integrated Screen: had done 1st IT few weeks ago, 2nd IT today Cystic fibrosis screening discussed requested Ultrasound discussed; fetal survey: requested Follow up in 2 weeks for anatomy u/s and visit [place] completed Discussed VBAC, consent given to take home and review  [place], [facility] 02/17/2014 2:59 PM

## 2014-02-18 ENCOUNTER — Encounter: Payer: Self-pay | Admitting: Women's Health

## 2014-02-18 DIAGNOSIS — O09899 Supervision of other high risk pregnancies, unspecified trimester: Secondary | ICD-10-CM | POA: Insufficient documentation

## 2014-02-18 DIAGNOSIS — Z283 Underimmunization status: Secondary | ICD-10-CM | POA: Insufficient documentation

## 2014-02-18 DIAGNOSIS — O9989 Other specified diseases and conditions complicating pregnancy, childbirth and the puerperium: Secondary | ICD-10-CM

## 2014-02-18 DIAGNOSIS — Z2839 Other underimmunization status: Secondary | ICD-10-CM | POA: Insufficient documentation

## 2014-02-18 LAB — DRUG SCREEN, URINE, NO CONFIRMATION
Amphetamine Screen, Ur: NEGATIVE
Barbiturate Quant, Ur: NEGATIVE
Benzodiazepines.: NEGATIVE
CREATININE, U: 165.1 mg/dL
Cocaine Metabolites: NEGATIVE
METHADONE: NEGATIVE
Marijuana Metabolite: NEGATIVE
Opiate Screen, Urine: NEGATIVE
PHENCYCLIDINE (PCP): NEGATIVE
Propoxyphene: NEGATIVE

## 2014-02-18 LAB — ANTIBODY SCREEN: Antibody Screen: NEGATIVE

## 2014-02-18 LAB — URINALYSIS
BILIRUBIN URINE: NEGATIVE
GLUCOSE, UA: NEGATIVE mg/dL
HGB URINE DIPSTICK: NEGATIVE
Ketones, ur: NEGATIVE mg/dL
LEUKOCYTES UA: NEGATIVE
Nitrite: POSITIVE — AB
PH: 6 (ref 5.0–8.0)
PROTEIN: NEGATIVE mg/dL
Specific Gravity, Urine: 1.019 (ref 1.005–1.030)
Urobilinogen, UA: 0.2 mg/dL (ref 0.0–1.0)

## 2014-02-18 LAB — SICKLE CELL SCREEN: SICKLE CELL SCREEN: NEGATIVE

## 2014-02-18 LAB — RPR

## 2014-02-18 LAB — VARICELLA ZOSTER ANTIBODY, IGG: VARICELLA IGG: 435.5 {index} — AB (ref <135.00)

## 2014-02-18 LAB — RUBELLA SCREEN: Rubella: 0.54 Index (ref <0.90)

## 2014-02-18 LAB — ABO AND RH: RH TYPE: POSITIVE

## 2014-02-18 LAB — HIV ANTIBODY (ROUTINE TESTING W REFLEX): HIV 1&2 Ab, 4th Generation: NONREACTIVE

## 2014-02-18 LAB — URINE CULTURE

## 2014-02-18 LAB — HEPATITIS B SURFACE ANTIGEN: Hepatitis B Surface Ag: NEGATIVE

## 2014-02-18 LAB — OXYCODONE SCREEN, UA, RFLX CONFIRM: Oxycodone Screen, Ur: NEGATIVE ng/mL

## 2014-02-19 LAB — CYSTIC FIBROSIS DIAGNOSTIC STUDY

## 2014-02-21 LAB — MATERNAL SCREEN, INTEGRATED #2
AFP MOM MAT SCREEN: 1.41
AFP, SERUM MAT SCREEN: 53.1 ng/mL
Calculated Gestational Age: 18.7
Crown Rump Length: 71.5 mm
ESTRIOL FREE MAT SCREEN: 1.03 ng/mL
Estriol Mom: 0.74
INHIBIN A MOM MAT SCREEN: 1.27
Inhibin A Dimeric: 176 pg/mL
MSS Down Syndrome: <1:5000 {titer}
NT MOM MAT SCREEN: 0.97
NUCHAL TRANSLUCENCY MAT SCREEN 2: 1.53 mm
Number of fetuses: 1
PAPP-A MOM MAT SCREEN: 0.97
PAPP-A: 715 ng/mL
Rish for ONTD: 1:4100 {titer}
hCG MoM: 1.22
hCG, Serum: 23.5 IU/mL

## 2014-02-25 ENCOUNTER — Encounter: Payer: Self-pay | Admitting: Women's Health

## 2014-02-27 ENCOUNTER — Other Ambulatory Visit: Payer: Self-pay | Admitting: Women's Health

## 2014-02-27 DIAGNOSIS — O09299 Supervision of pregnancy with other poor reproductive or obstetric history, unspecified trimester: Secondary | ICD-10-CM

## 2014-02-27 DIAGNOSIS — O34219 Maternal care for unspecified type scar from previous cesarean delivery: Secondary | ICD-10-CM

## 2014-03-03 ENCOUNTER — Encounter: Payer: Self-pay | Admitting: Obstetrics and Gynecology

## 2014-03-03 ENCOUNTER — Ambulatory Visit (INDEPENDENT_AMBULATORY_CARE_PROVIDER_SITE_OTHER): Payer: BC Managed Care – PPO

## 2014-03-03 ENCOUNTER — Encounter: Payer: Self-pay | Admitting: Women's Health

## 2014-03-03 ENCOUNTER — Ambulatory Visit (INDEPENDENT_AMBULATORY_CARE_PROVIDER_SITE_OTHER): Payer: BC Managed Care – PPO | Admitting: Obstetrics and Gynecology

## 2014-03-03 VITALS — BP 116/80 | Wt 206.8 lb

## 2014-03-03 DIAGNOSIS — O09299 Supervision of pregnancy with other poor reproductive or obstetric history, unspecified trimester: Secondary | ICD-10-CM

## 2014-03-03 DIAGNOSIS — E669 Obesity, unspecified: Secondary | ICD-10-CM

## 2014-03-03 DIAGNOSIS — O99019 Anemia complicating pregnancy, unspecified trimester: Secondary | ICD-10-CM

## 2014-03-03 DIAGNOSIS — Z331 Pregnant state, incidental: Secondary | ICD-10-CM

## 2014-03-03 DIAGNOSIS — O34219 Maternal care for unspecified type scar from previous cesarean delivery: Secondary | ICD-10-CM

## 2014-03-03 DIAGNOSIS — N898 Other specified noninflammatory disorders of vagina: Secondary | ICD-10-CM

## 2014-03-03 DIAGNOSIS — O2390 Unspecified genitourinary tract infection in pregnancy, unspecified trimester: Secondary | ICD-10-CM

## 2014-03-03 DIAGNOSIS — O0930 Supervision of pregnancy with insufficient antenatal care, unspecified trimester: Secondary | ICD-10-CM

## 2014-03-03 DIAGNOSIS — Z3480 Encounter for supervision of other normal pregnancy, unspecified trimester: Secondary | ICD-10-CM

## 2014-03-03 DIAGNOSIS — Z1389 Encounter for screening for other disorder: Secondary | ICD-10-CM

## 2014-03-03 DIAGNOSIS — O99210 Obesity complicating pregnancy, unspecified trimester: Secondary | ICD-10-CM

## 2014-03-03 DIAGNOSIS — O26899 Other specified pregnancy related conditions, unspecified trimester: Secondary | ICD-10-CM

## 2014-03-03 LAB — POCT WET PREP (WET MOUNT)

## 2014-03-03 LAB — POCT URINALYSIS DIPSTICK
Glucose, UA: NEGATIVE
Ketones, UA: NEGATIVE
Leukocytes, UA: NEGATIVE
Nitrite, UA: NEGATIVE
PROTEIN UA: NEGATIVE

## 2014-03-03 NOTE — Patient Instructions (Signed)
[facility] Attending Consult Note  33 y.o. [id number] at [redacted]w[redacted]d with Estimated Date of Delivery: 07/15/14 was seen today in office to discuss trial of labor after cesarean section (TOLAC) versus elective repeat cesarean delivery (ERCD). The following risks were discussed with the patient.  Risk of uterine rupture at term is 0.78 percent with TOLAC and 0.22 percent with ERCD. 1 in 10 uterine ruptures will result in neonatal death or neurological injury. The benefits of a trial of labor after cesarean (TOLAC) resulting in a vaginal birth after cesarean (VBAC) include the following: shorter length of hospital stay and postpartum recovery (in most cases); fewer complications, such as postpartum fever, wound or uterine infection, thromboembolism (blood clots in the leg or lung), need for blood transfusion and fewer neonatal breathing problems. The risks of an attempted VBAC or TOLAC include the following: Risk of failed trial of labor after cesarean (TOLAC) without a vaginal birth after cesarean (VBAC) resulting in repeat cesarean delivery (RCD) in about 20 to 106 percent of women who attempt VBAC.  Risk of rupture of uterus resulting in an emergency cesarean delivery. The risk of uterine rupture may be related in part to the type of uterine incision made during the first cesarean delivery. A previous transverse uterine incision has the lowest risk of rupture (0.2 to 1.5 percent risk). Vertical or T-shaped uterine incisions have a higher risk of uterine rupture (4 to 9 percent risk)The risk of fetal death is very low with both VBAC and elective repeat cesarean delivery (ERCD), but the likelihood of fetal death is higher with VBAC than with ERCD. Maternal death is very rare with either type of delivery. The risks of an elective repeat cesarean delivery (ERCD) were reviewed with the patient including but not limited to: 12/998 risk of uterine rupture which could have serious consequences, bleeding which may  require transfusion; infection which may require antibiotics; injury to bowel, bladder or other surrounding organs (bowel, bladder, ureters); injury to the fetus; need for additional procedures including hysterectomy in the event of a life-threatening hemorrhage; thromboembolic phenomenon; abnormal placentation; incisional problems; death and other postoperative or anesthesia complications.    These risks and benefits are summarized on the consent form, which was reviewed with the patient during the visit.  All her questions answered and she signed a consent indicating a preference for TOLAC/ERCD. A copy of the consent was given to the patient.  (She does say that she would want tubal sterilization in the event a cesarean section is performed due to any maternal-fetal indication.)  (Patient also desires bilateral tubal sterilization (BTS) at the time of RCD, also discussed the additional risks of regret, failure rate of 0.5-1%  with increased risk of ectopic gestation and permanent and irreversible nature of the procedure.  Other forms of reversible BCM discussed, also discussed vasectomy, patient desires BTS).  Will continue routine postpartum care at [facility].

## 2014-03-03 NOTE — Progress Notes (Signed)
Pt given a printout of TOLAC or ERCD. Pt desires tolac. Pt will review form and return for signing of consent at return to [facility]. Pt would want BTL at time of Cesarean if required

## 2014-03-03 NOTE — Progress Notes (Signed)
U/S(20+6wks)- active fetus, FHR- 144 bpm, meas c/w dates, fluid wnl, posterior Gr 0 placenta, cx appears closed(4.3cm), bilateral adnexa appears wnl, female fetus, no obvious abnl noted although sub-optimal views due to fetal position and maternal body habitus, would like to reck anatomy at ~28 weeks

## 2014-03-03 NOTE — Progress Notes (Signed)
[redacted]w[redacted]d [id number] female presents for routine prenatal visit and ultrasound today. She complains of a yellow colored vaginal discharge that has an associated odor. She denies vaginal itching, vaginal bleeding or any other complaints. Wet Prep few wbc, epithelial, no trich  IMP physiologic.changes U/s reviewed, normal female anatomy

## 2014-03-31 ENCOUNTER — Encounter: Payer: BC Managed Care – PPO | Admitting: Women's Health

## 2014-04-02 ENCOUNTER — Encounter: Payer: Self-pay | Admitting: Women's Health

## 2014-04-02 ENCOUNTER — Ambulatory Visit (INDEPENDENT_AMBULATORY_CARE_PROVIDER_SITE_OTHER): Payer: BC Managed Care – PPO | Admitting: Women's Health

## 2014-04-02 VITALS — BP 134/80 | Wt 211.5 lb

## 2014-04-02 DIAGNOSIS — Z3480 Encounter for supervision of other normal pregnancy, unspecified trimester: Secondary | ICD-10-CM

## 2014-04-02 DIAGNOSIS — Z331 Pregnant state, incidental: Secondary | ICD-10-CM

## 2014-04-02 DIAGNOSIS — Z1389 Encounter for screening for other disorder: Secondary | ICD-10-CM

## 2014-04-02 LAB — POCT URINALYSIS DIPSTICK
Glucose, UA: NEGATIVE
Ketones, UA: NEGATIVE
Leukocytes, UA: NEGATIVE
NITRITE UA: NEGATIVE
Protein, UA: NEGATIVE

## 2014-04-02 NOTE — Progress Notes (Signed)
Reports good fm. Denies uc's, lof, vb, uti s/s.  No complaints.  Wants BTL, discussed RLTCS/BTL vs. VBAC/pp BTL in hospital- wants to think about it a little more. Reviewed ptl s/s, fm.  All questions answered. F/U in 3wks for pn2, f/u anatomy u/s, and visit.

## 2014-04-02 NOTE — Patient Instructions (Signed)
You will have your sugar test next visit.  Please do not eat or drink anything after midnight the night before you come, not even water.  You will be here for at least two hours.     Second Trimester of Pregnancy The second trimester is from week 13 through week 28, months 4 through 6. The second trimester is often a time when you feel your best. Your body has also adjusted to being pregnant, and you begin to feel better physically. Usually, morning sickness has lessened or quit completely, you may have more energy, and you may have an increase in appetite. The second trimester is also a time when the fetus is growing rapidly. At the end of the sixth month, the fetus is about 9 inches long and weighs about 1 pounds. You will likely begin to feel the baby move (quickening) between 18 and 20 weeks of the pregnancy. BODY CHANGES Your body goes through many changes during pregnancy. The changes vary from woman to woman.   Your weight will continue to increase. You will notice your lower abdomen bulging out.  You may begin to get stretch marks on your hips, abdomen, and breasts.  You may develop headaches that can be relieved by medicines approved by your caregiver.  You may urinate more often because the fetus is pressing on your bladder.  You may develop or continue to have heartburn as a result of your pregnancy.  You may develop constipation because certain hormones are causing the muscles that push waste through your intestines to slow down.  You may develop hemorrhoids or swollen, bulging veins (varicose veins).  You may have back pain because of the weight gain and pregnancy hormones relaxing your joints between the bones in your pelvis and as a result of a shift in weight and the muscles that support your balance.  Your breasts will continue to grow and be tender.  Your gums may bleed and may be sensitive to brushing and flossing.  Dark spots or blotches (chloasma, mask of pregnancy)  may develop on your face. This will likely fade after the baby is born.  A dark line from your belly button to the pubic area (linea nigra) may appear. This will likely fade after the baby is born. WHAT TO EXPECT AT YOUR PRENATAL VISITS During a routine prenatal visit:  You will be weighed to make sure you and the fetus are growing normally.  Your blood pressure will be taken.  Your abdomen will be measured to track your baby's growth.  The fetal heartbeat will be listened to.  Any test results from the previous visit will be discussed. Your caregiver may ask you:  How you are feeling.  If you are feeling the baby move.  If you have had any abnormal symptoms, such as leaking fluid, bleeding, severe headaches, or abdominal cramping.  If you have any questions. Other tests that may be performed during your second trimester include:  Blood tests that check for:  Low iron levels (anemia).  Gestational diabetes (between 24 and 28 weeks).  Rh antibodies.  Urine tests to check for infections, diabetes, or protein in the urine.  An ultrasound to confirm the proper growth and development of the baby.  An amniocentesis to check for possible genetic problems.  Fetal screens for spina bifida and Down syndrome. HOME CARE INSTRUCTIONS   Avoid all smoking, herbs, alcohol, and unprescribed drugs. These chemicals affect the formation and growth of the baby.  Follow your caregiver's  instructions regarding medicine use. There are medicines that are either safe or unsafe to take during pregnancy.  Exercise only as directed by your caregiver. Experiencing uterine cramps is a good sign to stop exercising.  Continue to eat regular, healthy meals.  Wear a good support bra for breast tenderness.  Do not use hot tubs, steam rooms, or saunas.  Wear your seat belt at all times when driving.  Avoid raw meat, uncooked cheese, cat litter boxes, and soil used by cats. These carry germs that  can cause birth defects in the baby.  Take your prenatal vitamins.  Try taking a stool softener (if your caregiver approves) if you develop constipation. Eat more high-fiber foods, such as fresh vegetables or fruit and whole grains. Drink plenty of fluids to keep your urine clear or pale yellow.  Take warm sitz baths to soothe any pain or discomfort caused by hemorrhoids. Use hemorrhoid cream if your caregiver approves.  If you develop varicose veins, wear support hose. Elevate your feet for 15 minutes, 3 4 times a day. Limit salt in your diet.  Avoid heavy lifting, wear low heel shoes, and practice good posture.  Rest with your legs elevated if you have leg cramps or low back pain.  Visit your dentist if you have not gone yet during your pregnancy. Use a soft toothbrush to brush your teeth and be gentle when you floss.  A sexual relationship may be continued unless your caregiver directs you otherwise.  Continue to go to all your prenatal visits as directed by your caregiver. SEEK MEDICAL CARE IF:   You have dizziness.  You have mild pelvic cramps, pelvic pressure, or nagging pain in the abdominal area.  You have persistent nausea, vomiting, or diarrhea.  You have a bad smelling vaginal discharge.  You have pain with urination. SEEK IMMEDIATE MEDICAL CARE IF:   You have a fever.  You are leaking fluid from your vagina.  You have spotting or bleeding from your vagina.  You have severe abdominal cramping or pain.  You have rapid weight gain or loss.  You have shortness of breath with chest pain.  You notice sudden or extreme swelling of your face, hands, ankles, feet, or legs.  You have not felt your baby move in over an hour.  You have severe headaches that do not go away with medicine.  You have vision changes. Document Released: 10/11/2001 Document Revised: 06/19/2013 Document Reviewed: 12/18/2012 [facility] Patient Information 2014 [place].  Postpartum  Tubal Ligation A postpartum tubal ligation (PPTL) is a procedure that blocks the fallopian tubes right after childbirth or 1 2 days after childbirth. PPTL is done before the uterus returns to its normal location. The procedure is also called a minilaparotomy. By blocking the fallopian tubes, the eggs that are released from the ovaries cannot enter the uterus and sperm cannot reach the egg. A PPTL is done so you will not be able to get pregnant or have a baby.  Although this procedure may be reversed, it should be considered permanent and irreversible. If you want to have future pregnancies, you should not have this procedure. LET YOUR CAREGIVER KNOW ABOUT:  Allergies to food or medicine.  Medicines taken, including vitamins, herbs, eyedrops, over-the-counter medicines, and creams.  Use of steroids (by mouth or creams).  Previous problems with numbing medicines.  History of bleeding problems or blood clots.  Any recent colds or infections.  Previous surgery.  Other health problems, including diabetes and kidney problems.  RISKS AND COMPLICATIONS  Infection.  Bleeding.  Injury to other organs.  Anesthetic side effects.  Failure of the procedure.  Ectopic pregnancy.  Future regret about having the procedure done. BEFORE THE PROCEDURE   You may need to sign certain permission forms with your insurance up to 30 days before your due date.  After delivering your baby, you cannot eat or drink anything if the procedure is performed the same day. If you are having the procedure a day after delivering, you may be able to eat and drink until midnight. Your caregiver will give you specific directions depending on your situation. PROCEDURE   If done 1 2 days after delivery:  You will be given a medicine to make you sleep (general anesthetic) during the procedure.  A tube will be put down your throat to help your breath while under general anesthesia.  A small cut (incision) is made  just beneath the belly button.  The fallopian tubes are brought up through the incision.  The fallopian tubes are then sealed, tied, or cut.  If done after a caesarean delivery:  Tubal ligation is done through the incision already made (after the baby is delivered).  Once the tubes are blocked, the incision is closed with stitches (sutures). A bandage will be placed over the incisions. AFTER THE PROCEDURE  You may have some pain or cramps in the abdominal area for the next 3 7 days.  You will be given pain medicine to ease any discomfort.  You may also feel sick to your stomach if you were given a general anesthetic.  You may have some mild discomfort in the throat. This is from the tube that may have been placed in your throat while you were sleeping.  You may feel tired and should rest the remainder of the day. Document Released: 10/17/2005 Document Revised: 04/17/2012 Document Reviewed: 01/28/2012 [facility] Patient Information 2014 [place].

## 2014-04-24 ENCOUNTER — Ambulatory Visit (INDEPENDENT_AMBULATORY_CARE_PROVIDER_SITE_OTHER): Payer: BC Managed Care – PPO

## 2014-04-24 ENCOUNTER — Ambulatory Visit (INDEPENDENT_AMBULATORY_CARE_PROVIDER_SITE_OTHER): Payer: BC Managed Care – PPO | Admitting: Advanced Practice Midwife

## 2014-04-24 ENCOUNTER — Other Ambulatory Visit: Payer: BC Managed Care – PPO

## 2014-04-24 ENCOUNTER — Other Ambulatory Visit: Payer: Self-pay | Admitting: Women's Health

## 2014-04-24 VITALS — BP 132/82 | Wt 215.0 lb

## 2014-04-24 DIAGNOSIS — O358XX0 Maternal care for other (suspected) fetal abnormality and damage, not applicable or unspecified: Secondary | ICD-10-CM

## 2014-04-24 DIAGNOSIS — Z3482 Encounter for supervision of other normal pregnancy, second trimester: Secondary | ICD-10-CM

## 2014-04-24 DIAGNOSIS — Z1389 Encounter for screening for other disorder: Secondary | ICD-10-CM

## 2014-04-24 DIAGNOSIS — Z3480 Encounter for supervision of other normal pregnancy, unspecified trimester: Secondary | ICD-10-CM

## 2014-04-24 DIAGNOSIS — Z331 Pregnant state, incidental: Secondary | ICD-10-CM

## 2014-04-24 DIAGNOSIS — Z3483 Encounter for supervision of other normal pregnancy, third trimester: Secondary | ICD-10-CM

## 2014-04-24 LAB — CBC
HCT: 30.2 % — ABNORMAL LOW (ref 36.0–46.0)
Hemoglobin: 10.2 g/dL — ABNORMAL LOW (ref 12.0–15.0)
MCH: 32.2 pg (ref 26.0–34.0)
MCHC: 33.8 g/dL (ref 30.0–36.0)
MCV: 95.3 fL (ref 78.0–100.0)
Platelets: 194 10*3/uL (ref 150–400)
RBC: 3.17 MIL/uL — ABNORMAL LOW (ref 3.87–5.11)
RDW: 13.7 % (ref 11.5–15.5)
WBC: 9.6 10*3/uL (ref 4.0–10.5)

## 2014-04-24 LAB — POCT URINALYSIS DIPSTICK
Blood, UA: NEGATIVE
GLUCOSE UA: NEGATIVE
Ketones, UA: NEGATIVE
Leukocytes, UA: NEGATIVE
NITRITE UA: NEGATIVE

## 2014-04-24 NOTE — Progress Notes (Signed)
U/S(28+2wks)- vtx active fetus, FHR- 130 bpm, EFW 2 lb 11oz (54th%tile), fluid WNL, posterior Gr 1 placenta, cx appears closed (3+cm), bilateral adnexa appears WNL, female fetus "Victoria Gordon", cardiac OFT's noted on today's exam

## 2014-04-24 NOTE — Progress Notes (Signed)
[id number] [redacted]w[redacted]d Estimated Date of Delivery: 07/15/14  Blood pressure 132/82, weight 215 lb (97.523 kg), last menstrual period 10/08/2013.   BP weight and urine results all reviewed and noted.  Please refer to the obstetrical flow sheet for the fundal height and fetal heart rate documentation: Had u/s to recheck anatomy:  All normal  Patient reports good fetal movement, denies any bleeding and no rupture of membranes symptoms or regular contractions. Patient is without complaints. All questions were answered.  Plan:  Continued routine obstetrical care,   Follow up in 4 weeks for OB appointment, Sign VBAC consent (sent home with pt today)

## 2014-04-25 LAB — HIV ANTIBODY (ROUTINE TESTING W REFLEX): HIV 1&2 Ab, 4th Generation: NONREACTIVE

## 2014-04-25 LAB — GLUCOSE TOLERANCE, 2 HOURS W/ 1HR
Glucose, 1 hour: 127 mg/dL (ref 70–170)
Glucose, 2 hour: 103 mg/dL (ref 70–139)
Glucose, Fasting: 92 mg/dL (ref 70–99)

## 2014-04-25 LAB — ANTIBODY SCREEN: Antibody Screen: NEGATIVE

## 2014-04-25 LAB — RPR

## 2014-04-28 LAB — HSV 2 ANTIBODY, IGG: HSV 2 Glycoprotein G Ab, IgG: <0.1 IV

## 2014-05-22 ENCOUNTER — Encounter: Payer: Self-pay | Admitting: Advanced Practice Midwife

## 2014-05-22 ENCOUNTER — Ambulatory Visit (INDEPENDENT_AMBULATORY_CARE_PROVIDER_SITE_OTHER): Payer: Medicaid Other | Admitting: Advanced Practice Midwife

## 2014-05-22 VITALS — BP 120/70 | Wt 220.0 lb

## 2014-05-22 DIAGNOSIS — Z3480 Encounter for supervision of other normal pregnancy, unspecified trimester: Secondary | ICD-10-CM

## 2014-05-22 DIAGNOSIS — Z331 Pregnant state, incidental: Secondary | ICD-10-CM

## 2014-05-22 DIAGNOSIS — Z1389 Encounter for screening for other disorder: Secondary | ICD-10-CM

## 2014-05-22 DIAGNOSIS — Z3483 Encounter for supervision of other normal pregnancy, third trimester: Secondary | ICD-10-CM

## 2014-05-22 LAB — POCT URINALYSIS DIPSTICK
Glucose, UA: NEGATIVE
Ketones, UA: NEGATIVE
LEUKOCYTES UA: NEGATIVE
Nitrite, UA: NEGATIVE
PROTEIN UA: NEGATIVE

## 2014-05-22 NOTE — Progress Notes (Signed)
[id number] [redacted]w[redacted]d Estimated Date of Delivery: 07/15/14  Blood pressure 120/70, weight 220 lb (99.791 kg), last menstrual period 10/08/2013.   BP weight and urine results all reviewed and noted.  Please refer to the obstetrical flow sheet for the fundal height and fetal heart rate documentation:  Patient reports good fetal movement, denies any bleeding and no rupture of membranes symptoms or regular contractions. Patient is without complaints. All questions were answered.  Plan:  Continued routine obstetrical care, VBAC and BTL(if C/S) consent forms signed today  Follow up in 2 weeks for OB appointment,

## 2014-06-05 ENCOUNTER — Encounter: Payer: BC Managed Care – PPO | Admitting: Advanced Practice Midwife

## 2014-06-05 ENCOUNTER — Ambulatory Visit (INDEPENDENT_AMBULATORY_CARE_PROVIDER_SITE_OTHER): Payer: Medicaid Other | Admitting: Advanced Practice Midwife

## 2014-06-05 ENCOUNTER — Encounter: Payer: Self-pay | Admitting: Advanced Practice Midwife

## 2014-06-05 VITALS — BP 120/76 | Wt 219.0 lb

## 2014-06-05 DIAGNOSIS — O26843 Uterine size-date discrepancy, third trimester: Secondary | ICD-10-CM

## 2014-06-05 DIAGNOSIS — Z331 Pregnant state, incidental: Secondary | ICD-10-CM

## 2014-06-05 DIAGNOSIS — O26849 Uterine size-date discrepancy, unspecified trimester: Secondary | ICD-10-CM

## 2014-06-05 DIAGNOSIS — Z3480 Encounter for supervision of other normal pregnancy, unspecified trimester: Secondary | ICD-10-CM

## 2014-06-05 DIAGNOSIS — Z3483 Encounter for supervision of other normal pregnancy, third trimester: Secondary | ICD-10-CM

## 2014-06-05 DIAGNOSIS — Z1389 Encounter for screening for other disorder: Secondary | ICD-10-CM

## 2014-06-05 LAB — POCT URINALYSIS DIPSTICK
Blood, UA: NEGATIVE
Glucose, UA: NEGATIVE
Ketones, UA: NEGATIVE
LEUKOCYTES UA: NEGATIVE
NITRITE UA: NEGATIVE
PROTEIN UA: NEGATIVE

## 2014-06-05 NOTE — Addendum Note (Signed)
Addended by: Farley Ly on: 06/05/2014 04:13 PM   Modules accepted: Orders

## 2014-06-05 NOTE — Progress Notes (Addendum)
[id number] [redacted]w[redacted]d Estimated Date of Delivery: 07/15/14  Last menstrual period 10/08/2013.   BP weight and urine results all reviewed and noted. Urine dipped negative but looks like grapefruit juice  Please refer to the obstetrical flow sheet for the fundal height and fetal heart rate documentation:size>dates  Patient reports good fetal movement, denies any bleeding and no rupture of membranes symptoms or regular contractions. Patient is without complaints. All questions were answered.  Plan:  Continued routine obstetrical care, EFW/AFI next visit.  Culture urine  Follow up in 2 weeks for OB appointment,

## 2014-06-05 NOTE — Progress Notes (Addendum)
Pt denies any problems or concerns at this time.  

## 2014-06-06 LAB — URINE CULTURE: Colony Count: 50000

## 2014-06-19 ENCOUNTER — Ambulatory Visit (INDEPENDENT_AMBULATORY_CARE_PROVIDER_SITE_OTHER): Payer: Medicaid Other | Admitting: Advanced Practice Midwife

## 2014-06-19 ENCOUNTER — Other Ambulatory Visit: Payer: Self-pay | Admitting: Advanced Practice Midwife

## 2014-06-19 ENCOUNTER — Ambulatory Visit (INDEPENDENT_AMBULATORY_CARE_PROVIDER_SITE_OTHER): Payer: Medicaid Other

## 2014-06-19 ENCOUNTER — Encounter: Payer: Self-pay | Admitting: Advanced Practice Midwife

## 2014-06-19 VITALS — BP 126/74 | Wt 219.0 lb

## 2014-06-19 DIAGNOSIS — Z3480 Encounter for supervision of other normal pregnancy, unspecified trimester: Secondary | ICD-10-CM

## 2014-06-19 DIAGNOSIS — Z1389 Encounter for screening for other disorder: Secondary | ICD-10-CM

## 2014-06-19 DIAGNOSIS — Z331 Pregnant state, incidental: Secondary | ICD-10-CM

## 2014-06-19 DIAGNOSIS — O26843 Uterine size-date discrepancy, third trimester: Secondary | ICD-10-CM

## 2014-06-19 DIAGNOSIS — O34219 Maternal care for unspecified type scar from previous cesarean delivery: Secondary | ICD-10-CM

## 2014-06-19 DIAGNOSIS — Z3483 Encounter for supervision of other normal pregnancy, third trimester: Secondary | ICD-10-CM

## 2014-06-19 DIAGNOSIS — O26849 Uterine size-date discrepancy, unspecified trimester: Secondary | ICD-10-CM

## 2014-06-19 LAB — POCT URINALYSIS DIPSTICK
GLUCOSE UA: NEGATIVE
Ketones, UA: NEGATIVE
Leukocytes, UA: NEGATIVE
Nitrite, UA: NEGATIVE
Protein, UA: NEGATIVE
RBC UA: NEGATIVE

## 2014-06-19 NOTE — Progress Notes (Signed)
Pt denies any problems or concerns at this time.  

## 2014-06-19 NOTE — Progress Notes (Signed)
[id number] [redacted]w[redacted]d Estimated Date of Delivery: 07/15/14  Blood pressure 126/74, weight 219 lb (99.338 kg), last menstrual period 10/08/2013.   BP weight and urine results all reviewed and noted.  Please refer to the obstetrical flow sheet for the fundal height and fetal heart rate documentation: Had [place] today for size>dates. EFW 46% and AFI 12cms  Patient reports good fetal movement, denies any bleeding and no rupture of membranes symptoms or regular contractions. Patient is without complaints. All questions were answered.  Plan:  Continued routine obstetrical care,   Follow up in 1 weeks for OB appointment, GBS

## 2014-06-19 NOTE — Progress Notes (Signed)
U/S(36+2wks)-vtx active fetus, EFW 6 lb 5 oz (46th%tile), fluid WNL AFI-12.8cm SDP-5.2cm, posterior Gr 2 placenta, FHR- 132 bpm

## 2014-06-26 ENCOUNTER — Ambulatory Visit (INDEPENDENT_AMBULATORY_CARE_PROVIDER_SITE_OTHER): Payer: BC Managed Care – PPO | Admitting: Advanced Practice Midwife

## 2014-06-26 ENCOUNTER — Encounter: Payer: Self-pay | Admitting: Advanced Practice Midwife

## 2014-06-26 VITALS — BP 124/80 | Wt 218.0 lb

## 2014-06-26 DIAGNOSIS — Z3483 Encounter for supervision of other normal pregnancy, third trimester: Secondary | ICD-10-CM

## 2014-06-26 DIAGNOSIS — Z331 Pregnant state, incidental: Secondary | ICD-10-CM

## 2014-06-26 DIAGNOSIS — Z1159 Encounter for screening for other viral diseases: Secondary | ICD-10-CM

## 2014-06-26 DIAGNOSIS — Z3480 Encounter for supervision of other normal pregnancy, unspecified trimester: Secondary | ICD-10-CM

## 2014-06-26 DIAGNOSIS — Z1389 Encounter for screening for other disorder: Secondary | ICD-10-CM

## 2014-06-26 DIAGNOSIS — Z3685 Encounter for antenatal screening for Streptococcus B: Secondary | ICD-10-CM

## 2014-06-26 LAB — POCT URINALYSIS DIPSTICK
Blood, UA: NEGATIVE
Glucose, UA: NEGATIVE
KETONES UA: NEGATIVE
LEUKOCYTES UA: NEGATIVE
NITRITE UA: NEGATIVE
Protein, UA: NEGATIVE

## 2014-06-26 LAB — OB RESULTS CONSOLE GC/CHLAMYDIA
Chlamydia: NEGATIVE
GC PROBE AMP, GENITAL: NEGATIVE

## 2014-06-26 NOTE — Progress Notes (Signed)
[id number] [redacted]w[redacted]d Estimated Date of Delivery: 07/15/14  Blood pressure 124/80, weight 218 lb (98.884 kg), last menstrual period 10/08/2013.   BP weight and urine results all reviewed and noted.  Please refer to the obstetrical flow sheet for the fundal height and fetal heart rate documentation:  Patient reports good fetal movement, denies any bleeding and no rupture of membranes symptoms or regular contractions. Patient is without complaints. All questions were answered.  Plan:  Continued routine obstetrical care, GBS today  Follow up in 1 weeks for OB appointment,

## 2014-06-26 NOTE — Progress Notes (Signed)
Pt denies any problems or concerns at this time.  

## 2014-06-27 LAB — GC/CHLAMYDIA PROBE AMP
CT Probe RNA: NEGATIVE
GC PROBE AMP APTIMA: NEGATIVE

## 2014-06-28 LAB — STREP B DNA PROBE: STREP GROUP B AG: NOT DETECTED

## 2014-07-03 ENCOUNTER — Ambulatory Visit (INDEPENDENT_AMBULATORY_CARE_PROVIDER_SITE_OTHER): Payer: BC Managed Care – PPO | Admitting: Advanced Practice Midwife

## 2014-07-03 ENCOUNTER — Encounter: Payer: Self-pay | Admitting: Advanced Practice Midwife

## 2014-07-03 VITALS — BP 130/84 | Wt 221.0 lb

## 2014-07-03 DIAGNOSIS — Z3480 Encounter for supervision of other normal pregnancy, unspecified trimester: Secondary | ICD-10-CM

## 2014-07-03 DIAGNOSIS — Z1389 Encounter for screening for other disorder: Secondary | ICD-10-CM

## 2014-07-03 DIAGNOSIS — Z3483 Encounter for supervision of other normal pregnancy, third trimester: Secondary | ICD-10-CM

## 2014-07-03 DIAGNOSIS — Z331 Pregnant state, incidental: Secondary | ICD-10-CM

## 2014-07-03 LAB — POCT URINALYSIS DIPSTICK
Glucose, UA: NEGATIVE
Ketones, UA: NEGATIVE
NITRITE UA: NEGATIVE
PROTEIN UA: NEGATIVE

## 2014-07-03 NOTE — Progress Notes (Signed)
[id number] [redacted]w[redacted]d Estimated Date of Delivery: 07/15/14  Last menstrual period 10/08/2013.   BP weight and urine results all reviewed and noted.  Please refer to the obstetrical flow sheet for the fundal height and fetal heart rate documentation:  Patient reports good fetal movement, denies any bleeding and no rupture of membranes symptoms or regular contractions. Patient is without complaints. All questions were answered.  Plan:  Continued routine obstetrical care,   Follow up in 1 weeks for OB appointment,

## 2014-07-09 ENCOUNTER — Ambulatory Visit (INDEPENDENT_AMBULATORY_CARE_PROVIDER_SITE_OTHER): Payer: BC Managed Care – PPO | Admitting: Advanced Practice Midwife

## 2014-07-09 VITALS — BP 126/88 | Wt 222.0 lb

## 2014-07-09 DIAGNOSIS — Z3480 Encounter for supervision of other normal pregnancy, unspecified trimester: Secondary | ICD-10-CM

## 2014-07-09 DIAGNOSIS — Z331 Pregnant state, incidental: Secondary | ICD-10-CM

## 2014-07-09 DIAGNOSIS — Z1389 Encounter for screening for other disorder: Secondary | ICD-10-CM

## 2014-07-09 DIAGNOSIS — Z3483 Encounter for supervision of other normal pregnancy, third trimester: Secondary | ICD-10-CM

## 2014-07-09 LAB — POCT URINALYSIS DIPSTICK
Blood, UA: NEGATIVE
Glucose, UA: NEGATIVE
Ketones, UA: NEGATIVE
Leukocytes, UA: NEGATIVE
Nitrite, UA: NEGATIVE

## 2014-07-09 NOTE — Progress Notes (Signed)
[id number] [redacted]w[redacted]d Estimated Date of Delivery: 07/15/14  Last menstrual period 10/08/2013.   BP weight and urine results all reviewed and noted.  Please refer to the obstetrical flow sheet for the fundal height and fetal heart rate documentation:  Patient reports good fetal movement, denies any bleeding and no rupture of membranes symptoms or regular contractions. Patient is without complaints. All questions were answered.  Plan:  Continued routine obstetrical care,   Follow up in 1 weeks for OB appointment,

## 2014-07-16 ENCOUNTER — Ambulatory Visit (INDEPENDENT_AMBULATORY_CARE_PROVIDER_SITE_OTHER): Payer: Medicaid Other | Admitting: Women's Health

## 2014-07-16 ENCOUNTER — Encounter: Payer: Self-pay | Admitting: Women's Health

## 2014-07-16 VITALS — BP 132/84 | Wt 218.0 lb

## 2014-07-16 DIAGNOSIS — Z331 Pregnant state, incidental: Secondary | ICD-10-CM

## 2014-07-16 DIAGNOSIS — O34219 Maternal care for unspecified type scar from previous cesarean delivery: Secondary | ICD-10-CM

## 2014-07-16 DIAGNOSIS — Z1389 Encounter for screening for other disorder: Secondary | ICD-10-CM

## 2014-07-16 DIAGNOSIS — Z3480 Encounter for supervision of other normal pregnancy, unspecified trimester: Secondary | ICD-10-CM

## 2014-07-16 DIAGNOSIS — Z3483 Encounter for supervision of other normal pregnancy, third trimester: Secondary | ICD-10-CM

## 2014-07-16 LAB — POCT URINALYSIS DIPSTICK
Glucose, UA: NEGATIVE
KETONES UA: NEGATIVE
Leukocytes, UA: NEGATIVE
Nitrite, UA: NEGATIVE
Protein, UA: NEGATIVE
RBC UA: NEGATIVE

## 2014-07-16 NOTE — Patient Instructions (Signed)
Your induction is scheduled for 9/22 @ 7:00am. Go to [facility] hospital, Maternity Admissions Unit (Emergency) entrance and let them know you are there to be induced. They will send someone from Labor & Delivery to come get you.    Call the office [phone]) or go to [facility] if:  You begin to have strong, frequent contractions  Your water breaks.  Sometimes it is a big gush of fluid, sometimes it is just a trickle that keeps getting your panties wet or running down your legs  You have vaginal bleeding.  It is normal to have a small amount of spotting if your cervix was checked.   You don't feel your baby moving like normal.  If you don't, get you something to eat and drink and lay down and focus on feeling your baby move.  You should feel at least 10 movements in 2 hours.  If you don't, you should call the office or go to [place] Contractions Contractions of the uterus can occur throughout pregnancy. Contractions are not always a sign that you are in labor.  WHAT ARE BRAXTON HICKS CONTRACTIONS?  Contractions that occur before labor are called Braxton Hicks contractions, or false labor. Toward the end of pregnancy (32-34 weeks), these contractions can develop more often and may become more forceful. This is not true labor because these contractions do not result in opening (dilatation) and thinning of the cervix. They are sometimes difficult to tell apart from true labor because these contractions can be forceful and people have different pain tolerances. You should not feel embarrassed if you go to the hospital with false labor. Sometimes, the only way to tell if you are in true labor is for your health care provider to look for changes in the cervix. If there are no prenatal problems or other health problems associated with the pregnancy, it is completely safe to be sent home with false labor and await the onset of true labor. HOW CAN YOU TELL THE DIFFERENCE  BETWEEN TRUE AND FALSE LABOR? False Labor  The contractions of false labor are usually shorter and not as hard as those of true labor.   The contractions are usually irregular.   The contractions are often felt in the front of the lower abdomen and in the groin.   The contractions may go away when you walk around or change positions while lying down.   The contractions get weaker and are shorter lasting as time goes on.   The contractions do not usually become progressively stronger, regular, and closer together as with true labor.  True Labor  Contractions in true labor last 30-70 seconds, become very regular, usually become more intense, and increase in frequency.   The contractions do not go away with walking.   The discomfort is usually felt in the top of the uterus and spreads to the lower abdomen and low back.   True labor can be determined by your health care provider with an exam. This will show that the cervix is dilating and getting thinner.  WHAT TO REMEMBER  Keep up with your usual exercises and follow other instructions given by your health care provider.   Take medicines as directed by your health care provider.   Keep your regular prenatal appointments.   Eat and drink lightly if you think you are going into labor.   If Braxton Hicks contractions are making you uncomfortable:   Change your position from lying down or  resting to walking, or from walking to resting.   Sit and rest in a tub of warm water.   Drink 2-3 glasses of water. Dehydration may cause these contractions.   Do slow and deep breathing several times an hour.  WHEN SHOULD I SEEK IMMEDIATE MEDICAL CARE? Seek immediate medical care if:  Your contractions become stronger, more regular, and closer together.   You have fluid leaking or gushing from your vagina.   You have a fever.   You pass blood-tinged mucus.   You have vaginal bleeding.   You have continuous  abdominal pain.   You have low back pain that you never had before.   You feel your baby's head pushing down and causing pelvic pressure.   Your baby is not moving as much as it used to.  Document Released: 10/17/2005 Document Revised: 10/22/2013 Document Reviewed: 07/29/2013 [facility] Patient Information 2015 [place], [place]. This information is not intended to replace advice given to you by your health care provider. Make sure you discuss any questions you have with your health care provider.

## 2014-07-16 NOTE — Progress Notes (Signed)
Low-risk OB appointment [id number] [redacted]w[redacted]d Estimated Date of Delivery: 07/15/14 BP 132/84  Wt 218 lb (98.884 kg)  LMP 10/08/2013  BP, weight, and urine reviewed.  Refer to obstetrical flow sheet for FH & FHR. FH s=d today, last growth u/s @36wks  EFW 46% w/ AFI 12.8 Reports good fm.  Denies regular uc's, lof, vb, or uti s/s. No complaints. SVE: 3/70/-3, vtx        Offered membrane sweeping, discussed r/b- pt decided to proceed, I attempted to sweep membranes, but was unable to reach adequately, so JVF in to sweep membranes Reviewed labor s/s, fkc. Plan:  TOLAC IOL 9/22 @ 0700 if needed for PD F/U in 6wks for pp visit

## 2014-07-18 ENCOUNTER — Encounter (HOSPITAL_COMMUNITY): Payer: Self-pay | Admitting: *Deleted

## 2014-07-18 ENCOUNTER — Telehealth (HOSPITAL_COMMUNITY): Payer: Self-pay | Admitting: *Deleted

## 2014-07-18 NOTE — Telephone Encounter (Signed)
Preadmission screen  

## 2014-07-22 ENCOUNTER — Inpatient Hospital Stay (HOSPITAL_COMMUNITY): Admission: RE | Admit: 2014-07-22 | Payer: BC Managed Care – PPO | Source: Ambulatory Visit

## 2014-07-22 ENCOUNTER — Encounter (HOSPITAL_COMMUNITY): Payer: Medicaid Other | Admitting: Anesthesiology

## 2014-07-22 ENCOUNTER — Inpatient Hospital Stay (HOSPITAL_COMMUNITY)
Admission: AD | Admit: 2014-07-22 | Discharge: 2014-07-24 | DRG: 767 | Disposition: A | Discharge status: Home / Self Care | Payer: Medicaid Other | Source: Ambulatory Visit | Attending: Obstetrics & Gynecology | Admitting: Obstetrics & Gynecology

## 2014-07-22 ENCOUNTER — Inpatient Hospital Stay (HOSPITAL_COMMUNITY): Payer: Medicaid Other | Admitting: Anesthesiology

## 2014-07-22 ENCOUNTER — Encounter (HOSPITAL_COMMUNITY): Payer: Self-pay | Admitting: *Deleted

## 2014-07-22 ENCOUNTER — Encounter (HOSPITAL_COMMUNITY)
Admission: AD | Disposition: A | Discharge status: Home / Self Care | Payer: Self-pay | Source: Ambulatory Visit | Attending: Obstetrics & Gynecology

## 2014-07-22 DIAGNOSIS — O99214 Obesity complicating childbirth: Secondary | ICD-10-CM | POA: Diagnosis present

## 2014-07-22 DIAGNOSIS — O34219 Maternal care for unspecified type scar from previous cesarean delivery: Secondary | ICD-10-CM | POA: Diagnosis present

## 2014-07-22 DIAGNOSIS — Z6841 Body Mass Index (BMI) 40.0 and over, adult: Secondary | ICD-10-CM

## 2014-07-22 DIAGNOSIS — O479 False labor, unspecified: Secondary | ICD-10-CM | POA: Diagnosis present

## 2014-07-22 DIAGNOSIS — O09899 Supervision of other high risk pregnancies, unspecified trimester: Secondary | ICD-10-CM

## 2014-07-22 DIAGNOSIS — Z283 Underimmunization status: Secondary | ICD-10-CM

## 2014-07-22 DIAGNOSIS — Z833 Family history of diabetes mellitus: Secondary | ICD-10-CM

## 2014-07-22 DIAGNOSIS — Z3480 Encounter for supervision of other normal pregnancy, unspecified trimester: Secondary | ICD-10-CM

## 2014-07-22 DIAGNOSIS — O480 Post-term pregnancy: Principal | ICD-10-CM | POA: Diagnosis present

## 2014-07-22 DIAGNOSIS — O0940 Supervision of pregnancy with grand multiparity, unspecified trimester: Secondary | ICD-10-CM

## 2014-07-22 DIAGNOSIS — O2342 Unspecified infection of urinary tract in pregnancy, second trimester: Secondary | ICD-10-CM | POA: Diagnosis present

## 2014-07-22 DIAGNOSIS — Z302 Encounter for sterilization: Secondary | ICD-10-CM | POA: Diagnosis not present

## 2014-07-22 DIAGNOSIS — Z3483 Encounter for supervision of other normal pregnancy, third trimester: Secondary | ICD-10-CM

## 2014-07-22 DIAGNOSIS — O9989 Other specified diseases and conditions complicating pregnancy, childbirth and the puerperium: Secondary | ICD-10-CM

## 2014-07-22 DIAGNOSIS — Z2839 Other underimmunization status: Secondary | ICD-10-CM

## 2014-07-22 DIAGNOSIS — E669 Obesity, unspecified: Secondary | ICD-10-CM | POA: Diagnosis present

## 2014-07-22 HISTORY — PX: TUBAL LIGATION: SHX77

## 2014-07-22 LAB — COMPREHENSIVE METABOLIC PANEL
ALBUMIN: 3 g/dL — AB (ref 3.5–5.2)
ALT: 36 U/L — ABNORMAL HIGH (ref 0–35)
ANION GAP: 17 — AB (ref 5–15)
AST: 145 U/L — ABNORMAL HIGH (ref 0–37)
Alkaline Phosphatase: 207 U/L — ABNORMAL HIGH (ref 39–117)
BUN: 9 mg/dL (ref 6–23)
CO2: 19 mEq/L (ref 19–32)
CREATININE: 0.58 mg/dL (ref 0.50–1.10)
Calcium: 9 mg/dL (ref 8.4–10.5)
Chloride: 101 mEq/L (ref 96–112)
GFR calc non Af Amer: >90 mL/min (ref >90)
GLUCOSE: 94 mg/dL (ref 70–99)
Potassium: 4 mEq/L (ref 3.7–5.3)
Sodium: 137 mEq/L (ref 137–147)
TOTAL PROTEIN: 6.7 g/dL (ref 6.0–8.3)
Total Bilirubin: 0.3 mg/dL (ref 0.3–1.2)

## 2014-07-22 LAB — CBC
HCT: 31 % — ABNORMAL LOW (ref 36.0–46.0)
Hemoglobin: 10.7 g/dL — ABNORMAL LOW (ref 12.0–15.0)
MCH: 32.5 pg (ref 26.0–34.0)
MCHC: 34.5 g/dL (ref 30.0–36.0)
MCV: 94.2 fL (ref 78.0–100.0)
Platelets: 217 10*3/uL (ref 150–400)
RBC: 3.29 MIL/uL — AB (ref 3.87–5.11)
RDW: 14.5 % (ref 11.5–15.5)
WBC: 13 10*3/uL — ABNORMAL HIGH (ref 4.0–10.5)

## 2014-07-22 LAB — TYPE AND SCREEN
ABO/RH(D): O POS
Antibody Screen: NEGATIVE

## 2014-07-22 LAB — PROTEIN / CREATININE RATIO, URINE
CREATININE, URINE: 17.16 mg/dL
PROTEIN CREATININE RATIO: 0.98 — AB (ref 0.00–0.15)
Total Protein, Urine: 16.8 mg/dL

## 2014-07-22 LAB — ABO/RH: ABO/RH(D): O POS

## 2014-07-22 LAB — RPR

## 2014-07-22 SURGERY — LIGATION, FALLOPIAN TUBE, POSTPARTUM
Anesthesia: Epidural | Laterality: Bilateral

## 2014-07-22 MED ORDER — SIMETHICONE 80 MG PO CHEW: 80.0000 mg | CHEWABLE_TABLET | ORAL | Status: DC | PRN

## 2014-07-22 MED ORDER — EPHEDRINE 5 MG/ML INJ
10.0000 mg | INTRAVENOUS | Status: DC | PRN
  Filled 2014-07-22: qty 2

## 2014-07-22 MED ORDER — DIBUCAINE 1 % RE OINT
1.0000 | TOPICAL_OINTMENT | RECTAL | Status: DC | PRN
Start: 2014-07-22 — End: 2014-07-24

## 2014-07-22 MED ORDER — MIDAZOLAM HCL 2 MG/2ML IJ SOLN
INTRAMUSCULAR | Status: AC
  Filled 2014-07-22: qty 2

## 2014-07-22 MED ORDER — LIDOCAINE-EPINEPHRINE (PF) 2 %-1:200000 IJ SOLN
INTRAMUSCULAR | Status: AC
  Filled 2014-07-22: qty 20

## 2014-07-22 MED ORDER — MIDAZOLAM HCL 5 MG/5ML IJ SOLN
INTRAMUSCULAR | Status: DC | PRN
  Administered 2014-07-22 (×3): 1 mg via INTRAVENOUS

## 2014-07-22 MED ORDER — DIPHENHYDRAMINE HCL 25 MG PO CAPS: 25.0000 mg | ORAL_CAPSULE | Freq: Four times a day (QID) | ORAL | Status: DC | PRN

## 2014-07-22 MED ORDER — BUPIVACAINE HCL (PF) 0.25 % IJ SOLN
INTRAMUSCULAR | Status: DC | PRN
Start: 2014-07-22 — End: 2014-07-22
  Administered 2014-07-22: 10 mL

## 2014-07-22 MED ORDER — OXYCODONE-ACETAMINOPHEN 5-325 MG PO TABS: 1.0000 | ORAL_TABLET | ORAL | Status: DC | PRN

## 2014-07-22 MED ORDER — FENTANYL CITRATE 0.05 MG/ML IJ SOLN: 25.0000 ug | INTRAMUSCULAR | Status: DC | PRN

## 2014-07-22 MED ORDER — LACTATED RINGERS IV SOLN: INTRAVENOUS | Status: DC

## 2014-07-22 MED ORDER — ONDANSETRON HCL 4 MG/2ML IJ SOLN: 4.0000 mg | INTRAMUSCULAR | Status: DC | PRN

## 2014-07-22 MED ORDER — OXYCODONE-ACETAMINOPHEN 5-325 MG PO TABS: 2.0000 | ORAL_TABLET | ORAL | Status: DC | PRN

## 2014-07-22 MED ORDER — FENTANYL 2.5 MCG/ML BUPIVACAINE 1/10 % EPIDURAL INFUSION (WH - ANES)
14.0000 mL/h | INTRAMUSCULAR | Status: DC | PRN
  Administered 2014-07-22: 14 mL/h via EPIDURAL

## 2014-07-22 MED ORDER — BUTORPHANOL TARTRATE 1 MG/ML IJ SOLN
1.0000 mg | INTRAMUSCULAR | Status: DC | PRN
  Administered 2014-07-22 (×3): 1 mg via INTRAVENOUS
  Filled 2014-07-22 (×2): qty 1

## 2014-07-22 MED ORDER — ONDANSETRON HCL 4 MG/2ML IJ SOLN
INTRAMUSCULAR | Status: AC
  Filled 2014-07-22: qty 2

## 2014-07-22 MED ORDER — OXYTOCIN BOLUS FROM INFUSION: 500.0000 mL | INTRAVENOUS | Status: DC

## 2014-07-22 MED ORDER — LACTATED RINGERS IV SOLN: 500.0000 mL | Freq: Once | INTRAVENOUS | Status: DC

## 2014-07-22 MED ORDER — PHENYLEPHRINE 40 MCG/ML (10ML) SYRINGE FOR IV PUSH (FOR BLOOD PRESSURE SUPPORT)
80.0000 ug | PREFILLED_SYRINGE | INTRAVENOUS | Status: DC | PRN
  Filled 2014-07-22: qty 2

## 2014-07-22 MED ORDER — BENZOCAINE-MENTHOL 20-0.5 % EX AERO: 1.0000 "application " | INHALATION_SPRAY | CUTANEOUS | Status: DC | PRN

## 2014-07-22 MED ORDER — OXYCODONE-ACETAMINOPHEN 5-325 MG PO TABS
2.0000 | ORAL_TABLET | ORAL | Status: DC | PRN
  Administered 2014-07-23 – 2014-07-24 (×5): 2 via ORAL
  Filled 2014-07-22 (×6): qty 2

## 2014-07-22 MED ORDER — LANOLIN HYDROUS EX OINT: 1.0000 "application " | TOPICAL_OINTMENT | CUTANEOUS | Status: DC | PRN

## 2014-07-22 MED ORDER — WITCH HAZEL-GLYCERIN EX PADS: 1.0000 "application " | MEDICATED_PAD | CUTANEOUS | Status: DC | PRN

## 2014-07-22 MED ORDER — METOCLOPRAMIDE HCL 10 MG PO TABS
10.0000 mg | ORAL_TABLET | Freq: Once | ORAL | Status: AC
  Administered 2014-07-22: 10 mg via ORAL
  Filled 2014-07-22: qty 1

## 2014-07-22 MED ORDER — TETANUS-DIPHTH-ACELL PERTUSSIS 5-2.5-18.5 LF-MCG/0.5 IM SUSP
0.5000 mL | Freq: Once | INTRAMUSCULAR | Status: DC
Start: 2014-07-23 — End: 2014-07-22

## 2014-07-22 MED ORDER — MEASLES, MUMPS & RUBELLA VAC ~~LOC~~ INJ
0.5000 mL | INJECTION | Freq: Once | SUBCUTANEOUS | Status: AC
Start: 2014-07-23 — End: 2014-07-24
  Administered 2014-07-24: 0.5 mL via SUBCUTANEOUS
  Filled 2014-07-22 (×2): qty 0.5

## 2014-07-22 MED ORDER — PRENATAL PLUS 27-1 MG PO TABS
1.0000 | ORAL_TABLET | Freq: Every day | ORAL | Status: DC
Start: 2014-07-22 — End: 2014-07-22

## 2014-07-22 MED ORDER — CITRIC ACID-SODIUM CITRATE 334-500 MG/5ML PO SOLN
30.0000 mL | ORAL | Status: DC | PRN
Start: 2014-07-22 — End: 2014-07-22

## 2014-07-22 MED ORDER — FENTANYL 2.5 MCG/ML BUPIVACAINE 1/10 % EPIDURAL INFUSION (WH - ANES)
INTRAMUSCULAR | Status: AC
  Administered 2014-07-22: 14 mL/h via EPIDURAL
  Filled 2014-07-22: qty 125

## 2014-07-22 MED ORDER — IBUPROFEN 600 MG PO TABS
600.0000 mg | ORAL_TABLET | Freq: Four times a day (QID) | ORAL | Status: DC
  Administered 2014-07-22 – 2014-07-24 (×7): 600 mg via ORAL
  Filled 2014-07-22 (×7): qty 1

## 2014-07-22 MED ORDER — ACETAMINOPHEN 325 MG PO TABS
650.0000 mg | ORAL_TABLET | ORAL | Status: DC | PRN
  Administered 2014-07-22: 650 mg via ORAL
  Filled 2014-07-22: qty 2

## 2014-07-22 MED ORDER — LACTATED RINGERS IV SOLN
INTRAVENOUS | Status: DC
  Administered 2014-07-22 (×2): via INTRAVENOUS

## 2014-07-22 MED ORDER — ONDANSETRON HCL 4 MG PO TABS: 4.0000 mg | ORAL_TABLET | ORAL | Status: DC | PRN

## 2014-07-22 MED ORDER — ONDANSETRON HCL 4 MG/2ML IJ SOLN
4.0000 mg | Freq: Four times a day (QID) | INTRAMUSCULAR | Status: DC | PRN
Start: 2014-07-22 — End: 2014-07-22

## 2014-07-22 MED ORDER — ZOLPIDEM TARTRATE 5 MG PO TABS: 5.0000 mg | ORAL_TABLET | Freq: Every evening | ORAL | Status: DC | PRN

## 2014-07-22 MED ORDER — OXYCODONE-ACETAMINOPHEN 5-325 MG PO TABS
1.0000 | ORAL_TABLET | ORAL | Status: DC | PRN
  Administered 2014-07-23: 1 via ORAL
  Filled 2014-07-22: qty 1

## 2014-07-22 MED ORDER — OXYTOCIN 40 UNITS IN LACTATED RINGERS INFUSION - SIMPLE MED: 62.5000 mL/h | INTRAVENOUS | Status: DC

## 2014-07-22 MED ORDER — LACTATED RINGERS IV SOLN
INTRAVENOUS | Status: DC | PRN
  Administered 2014-07-22: 14:00:00 via INTRAVENOUS

## 2014-07-22 MED ORDER — SENNOSIDES-DOCUSATE SODIUM 8.6-50 MG PO TABS
2.0000 | ORAL_TABLET | ORAL | Status: DC
  Administered 2014-07-23 – 2014-07-24 (×2): 2 via ORAL
  Filled 2014-07-22 (×2): qty 2

## 2014-07-22 MED ORDER — PHENYLEPHRINE 40 MCG/ML (10ML) SYRINGE FOR IV PUSH (FOR BLOOD PRESSURE SUPPORT)
PREFILLED_SYRINGE | INTRAVENOUS | Status: AC
  Filled 2014-07-22: qty 10

## 2014-07-22 MED ORDER — FAMOTIDINE 20 MG PO TABS
40.0000 mg | ORAL_TABLET | Freq: Once | ORAL | Status: AC
  Administered 2014-07-22: 40 mg via ORAL
  Filled 2014-07-22: qty 2

## 2014-07-22 MED ORDER — SODIUM BICARBONATE 8.4 % IV SOLN
INTRAVENOUS | Status: DC | PRN
  Administered 2014-07-22: 5 mL via EPIDURAL
  Administered 2014-07-22: 7 mL via EPIDURAL
  Administered 2014-07-22: 5 mL via EPIDURAL
  Administered 2014-07-22: 3 mL via EPIDURAL

## 2014-07-22 MED ORDER — FERROUS SULFATE 325 (65 FE) MG PO TABS
325.0000 mg | ORAL_TABLET | Freq: Two times a day (BID) | ORAL | Status: DC
  Administered 2014-07-23 – 2014-07-24 (×3): 325 mg via ORAL
  Filled 2014-07-22 (×3): qty 1

## 2014-07-22 MED ORDER — LIDOCAINE HCL (PF) 1 % IJ SOLN
INTRAMUSCULAR | Status: DC | PRN
  Administered 2014-07-22: 10 mL

## 2014-07-22 MED ORDER — ONDANSETRON HCL 4 MG/2ML IJ SOLN
INTRAMUSCULAR | Status: DC | PRN
  Administered 2014-07-22: 4 mg via INTRAVENOUS

## 2014-07-22 MED ORDER — FLEET ENEMA 7-19 GM/118ML RE ENEM
1.0000 | ENEMA | RECTAL | Status: DC | PRN
Start: 2014-07-22 — End: 2014-07-22

## 2014-07-22 MED ORDER — LACTATED RINGERS IV SOLN: 500.0000 mL | INTRAVENOUS | Status: DC | PRN

## 2014-07-22 MED ORDER — PRENATAL MULTIVITAMIN CH
1.0000 | ORAL_TABLET | Freq: Every day | ORAL | Status: DC
  Administered 2014-07-23: 1 via ORAL
  Filled 2014-07-22: qty 1

## 2014-07-22 MED ORDER — SODIUM BICARBONATE 8.4 % IV SOLN
INTRAVENOUS | Status: AC
  Filled 2014-07-22: qty 50

## 2014-07-22 MED ORDER — DIPHENHYDRAMINE HCL 50 MG/ML IJ SOLN: 12.5000 mg | INTRAMUSCULAR | Status: DC | PRN

## 2014-07-22 MED ORDER — FENTANYL CITRATE 0.05 MG/ML IJ SOLN
INTRAMUSCULAR | Status: AC
  Filled 2014-07-22: qty 2

## 2014-07-22 MED ORDER — TERBUTALINE SULFATE 1 MG/ML IJ SOLN: 0.2500 mg | Freq: Once | INTRAMUSCULAR | Status: DC | PRN

## 2014-07-22 MED ORDER — MAGNESIUM HYDROXIDE 400 MG/5ML PO SUSP: 30.0000 mL | ORAL | Status: DC | PRN

## 2014-07-22 MED ORDER — FENTANYL CITRATE 0.05 MG/ML IJ SOLN
INTRAMUSCULAR | Status: DC | PRN
  Administered 2014-07-22 (×2): 50 ug via INTRAVENOUS

## 2014-07-22 MED ORDER — OXYTOCIN 40 UNITS IN LACTATED RINGERS INFUSION - SIMPLE MED
1.0000 m[IU]/min | INTRAVENOUS | Status: DC
Start: 2014-07-22 — End: 2014-07-22
  Administered 2014-07-22: 1 m[IU]/min via INTRAVENOUS
  Filled 2014-07-22: qty 1000

## 2014-07-22 MED ORDER — LIDOCAINE HCL (PF) 1 % IJ SOLN
30.0000 mL | INTRAMUSCULAR | Status: DC | PRN
  Filled 2014-07-22: qty 30

## 2014-07-22 MED ORDER — BUPIVACAINE HCL (PF) 0.25 % IJ SOLN
INTRAMUSCULAR | Status: AC
  Filled 2014-07-22: qty 30

## 2014-07-22 SURGICAL SUPPLY — 27 items
ADH SKN CLS LQ APL DERMABOND (GAUZE/BANDAGES/DRESSINGS) ×1
BLADE 11 SAFETY STRL DISP (BLADE) ×2 IMPLANT
CHLORAPREP W/TINT 26ML (MISCELLANEOUS) ×2 IMPLANT
CLIP FILSHIE TUBAL LIGA STRL (Clip) ×2 IMPLANT
CLOTH BEACON ORANGE TIMEOUT ST (SAFETY) ×2 IMPLANT
DERMABOND ADHESIVE PROPEN (GAUZE/BANDAGES/DRESSINGS) ×1
DERMABOND ADVANCED .7 DNX6 (GAUZE/BANDAGES/DRESSINGS) IMPLANT
DRSG OPSITE POSTOP 3X4 (GAUZE/BANDAGES/DRESSINGS) ×2 IMPLANT
GLOVE BIO SURGEON STRL SZ 6.5 (GLOVE) ×2 IMPLANT
GLOVE BIOGEL PI IND STRL 7.0 (GLOVE) ×1 IMPLANT
GLOVE BIOGEL PI INDICATOR 7.0 (GLOVE) ×1
GLOVE LATEX FREE NEOLON SZ 8.5 (GLOVE) ×1 IMPLANT
GOWN STRL REUS W/TWL LRG LVL3 (GOWN DISPOSABLE) ×4 IMPLANT
NDL HYPO 25X1 1.5 SAFETY (NEEDLE) ×1 IMPLANT
NEEDLE HYPO 25X1 1.5 SAFETY (NEEDLE) ×2 IMPLANT
NS IRRIG 1000ML POUR BTL (IV SOLUTION) ×2 IMPLANT
PACK ABDOMINAL MINOR (CUSTOM PROCEDURE TRAY) ×2 IMPLANT
SPONGE LAP 4X18 X RAY DECT (DISPOSABLE) IMPLANT
SUT PLAIN 2 0 (SUTURE) ×2
SUT PLAIN ABS 2-0 54XMFL TIE (SUTURE) IMPLANT
SUT VIC AB 0 CT1 27 (SUTURE) ×2
SUT VIC AB 0 CT1 27XBRD ANBCTR (SUTURE) ×1 IMPLANT
SUT VICRYL 4-0 PS2 18IN ABS (SUTURE) ×2 IMPLANT
SYR CONTROL 10ML LL (SYRINGE) ×2 IMPLANT
TOWEL OR 17X24 6PK STRL BLUE (TOWEL DISPOSABLE) ×4 IMPLANT
TRAY FOLEY BAG SILVER LF 14FR (CATHETERS) ×2 IMPLANT
WATER STERILE IRR 1000ML POUR (IV SOLUTION) ×2 IMPLANT

## 2014-07-22 NOTE — Anesthesia Procedure Notes (Signed)
Epidural Patient location during procedure: OB  Preanesthetic Checklist Completed: patient identified, site marked, surgical consent, pre-op evaluation, timeout performed, IV checked, risks and benefits discussed and monitors and equipment checked  Epidural Patient position: sitting Prep: site prepped and draped and DuraPrep Patient monitoring: continuous pulse ox and blood pressure Approach: midline Injection technique: LOR air  Needle:  Needle type: Tuohy  Needle gauge: 17 G Needle length: 9 cm and 9 Needle insertion depth: 7 cm Catheter type: closed end flexible Catheter size: 19 Gauge Catheter at skin depth: 14 cm Test dose: negative  Assessment Events: blood not aspirated, injection not painful, no injection resistance, negative IV test and no paresthesia  Additional Notes Dosing of Epidural:  1st dose, through catheter ............................................Marland Kitchen  Xylocaine 40 mg  2nd dose, through catheter, after waiting 3 minutes........Marland KitchenXylocaine 60 mg    ( 1% Xylo charted as a single dose in Epic Meds for ease of charting; actual dosing was fractionated as above, for saftey's sake)  As each dose occurred, patient was free of IV sx; and patient exhibited no evidence of SA injection.  Patient is more comfortable after epidural dosed. Please see RN's note for documentation of vital signs,and FHR which are stable.  Patient reminded not to try to ambulate with numb legs, and that an RN must be present when she attempts to get up.

## 2014-07-22 NOTE — Progress Notes (Signed)
A urine sample, by In&out catheter, was collected by Francine Graven from L&D at [place] for Victoria Bustle, RN.  The sample was hand delivered by to the lab with the mother's label on it.  It is to satisfy an order from this morning; the initial sample was rejected by lab because of a mislabelling by L&D. M/B was notified at 1730.

## 2014-07-22 NOTE — Progress Notes (Signed)
Called by laboratory and questioned regarding urine specimen not being received for an order from this morning.  Per record, urine was collected and sent to the lab at 1024.  Lab reports no specimen was received.  I called L&D RN who collected the specimen and asked her to contact lab to verify collection.  Baby's label was placed on the specimen, not this patients.  Spoke to Wilmar, mid wife with [place] and reported this.  Specimen will need to be collected by in and out cath.

## 2014-07-22 NOTE — Progress Notes (Signed)
Victoria Gordon is a 33 y.o. [id number] at [redacted]w[redacted]d by  admitted for induction of labor due to Hypertension.  Subjective: Post partum headache present   ROS: Denies visual changes or RUQ pain Objective: BP 166/88  Pulse 87  Temp(Src) 97.5 F (36.4 C) (Oral)  Resp 18  Ht 5\' 2"  (1.575 m)  Wt 98.884 kg (218 lb)  BMI 39.86 kg/m2  SpO2 99%  LMP 10/08/2013   Total I/O In: -  Out: 600 [Urine:300; Blood:300]  Labs: Lab Results  Component Value Date   WBC 13.0* 07/22/2014   HGB 10.7* 07/22/2014   HCT 31.0* 07/22/2014   MCV 94.2 07/22/2014   PLT 217 07/22/2014    Assessment / Plan S/P VBAC of viable baby girl Elevated blood pressures during labor Headache  CBC , protein:creatinin ratio ordered, and CBC ordered Continue to monitor for resolution of headche post analgesia Cntinue to monitor for Pregnancy Induced Hypertension    Doylene Bode [facility] 07/22/2014, 10:49 AM

## 2014-07-22 NOTE — Transfer of Care (Signed)
Immediate Anesthesia Transfer of Care Note  Patient: Victoria Gordon  Procedure(s) Performed: Procedure(s): POST PARTUM TUBAL LIGATION (Bilateral)  Patient Location: PACU  Anesthesia Type:Epidural  Level of Consciousness: awake, alert  and oriented  Airway & Oxygen Therapy: Patient Spontanous Breathing  Post-op Assessment: Report given to PACU RN and Post -op Vital signs reviewed and stable  Post vital signs: Reviewed and stable  Complications: No apparent anesthesia complications

## 2014-07-22 NOTE — MAU Note (Signed)
Contractions since this morning. States she has some bloody show and has had some leaking of fluid since this morning. +FM.

## 2014-07-22 NOTE — H&P (Signed)
LABOR ADMISSION HISTORY AND PHYSICAL  Victoria Gordon is a 33 y.o. female [phone] with IUP at [redacted]w[redacted]d by L/13 presenting for contractions and ?ROM. Pt presents w/ leakage of fluid since early this AM. Since this she has noted worsening contractions in intensity, but still occuring about every 15 minutes. She was scheduled for PDIOL on 9/22 @ 0700 and thus presented for eval. She reports +FMs, No VB, no blurry vision, headaches or peripheral edema, and RUQ pain. She desires an epidural for labor pain control. She plans on breast and bottle feeding. She request BTL for birth control and papers have been signed. Epidural ordered.  Dating: By L/13 --->  Estimated Date of Delivery: 07/15/14  Sono:   Normal anatomy scan  Prenatal History/Complications: H/O c-section 2/2 breech. Desires TOLAC UTI Rubella NI  Past Medical History: History reviewed. No pertinent past medical history.  Past Surgical History: Past Surgical History  Procedure Laterality Date  . Cesarean section    . Mouth surgery    . Cholecystectomy      Obstetrical History: OB History   Grav Para Term Preterm Abortions TAB SAB Ect Mult Living   7 4 4  0 2 0 2 0 0 4     Gynecological History: OB History   Grav Para Term Preterm Abortions TAB SAB Ect Mult Living   7 4 4  0 2 0 2 0 0 4      Social History: History   Social History  . Marital Status: Single    Spouse Name: N/A    Number of Children: N/A  . Years of Education: N/A   Social History Main Topics  . Smoking status: Never Smoker   . Smokeless tobacco: Never Used  . Alcohol Use: No  . Drug Use: No  . Sexual Activity: Not Currently    Birth Control/ Protection: None   Other Topics Concern  . None   Social History Narrative  . None    Family History: Family History  Problem Relation Age of Onset  . Diabetes Maternal Grandmother   . Diabetes Paternal Grandfather     Allergies: No Known Allergies  Prescriptions prior to admission   Medication Sig Dispense Refill  . prenatal vitamin w/FE, FA (PRENATAL 1 + 1) 27-1 MG TABS tablet Take 1 tablet by mouth daily.         Review of Systems   All systems reviewed and negative except as stated in HPI  Blood pressure 133/89, pulse 78, temperature 98.4 F (36.9 C), temperature source Oral, resp. rate 18, height 5\' 2"  (1.575 m), weight 218 lb (98.884 kg), last menstrual period 10/08/2013. General appearance: alert, cooperative and mild distress Lungs: clear to auscultation bilaterally Heart: regular rate and rhythm Abdomen: soft, non-tender; bowel sounds normal Pelvic: adequate Extremities: Homans sign is negative, no sign of DVT Presentation: cephalic Fetal monitoringBaseline: 135 bpm, Variability: Good {> 6 bpm), Accelerations: Reactive and Decelerations: Absent Uterine activityDate/time of onset: 9/20 @ 8pm, Frequency: Every 8-15 minutes, Duration: 20-30 seconds and Intensity: strong Dilation: 4 Effacement (%): 70 Station: -2 Exam by:: Dr Mancel Bale   Prenatal labs: ABO, Rh: --/--/O POS (09/22 0150) Antibody: NEG (09/22 0150) Rubella:   RPR: NON REAC (06/25 0924)  HBsAg: NEGATIVE (04/20 1510)  HIV: NONREACTIVE (06/25 0924)  GBS: NOT DETECTED (08/27 1457)  2 hr Glucola 92/127/103 Genetic screening  NT/IT neg Anatomy US WNL   Prenatal Transfer Tool  Maternal Diabetes: No Genetic Screening: Normal Maternal Ultrasounds/Referrals: Normal Fetal Ultrasounds or other  Referrals:  None Maternal Substance Abuse:  No Significant Maternal Medications:  None Significant Maternal Lab Results: Lab values include: Group B Strep negative     Results for orders placed during the hospital encounter of 07/22/14 (from the past 24 hour(s))  CBC   Collection Time    07/22/14  1:50 AM      Result Value Ref Range   WBC 13.0 (*) 4.0 - 10.5 K/uL   RBC 3.29 (*) 3.87 - 5.11 MIL/uL   Hemoglobin 10.7 (*) 12.0 - 15.0 g/dL   HCT 31.0 (*) 36.0 - 46.0 %   MCV 94.2  78.0 - 100.0 fL    MCH 32.5  26.0 - 34.0 pg   MCHC 34.5  30.0 - 36.0 g/dL   RDW 14.5  11.5 - 15.5 %   Platelets 217  150 - 400 K/uL  TYPE AND SCREEN   Collection Time    07/22/14  1:50 AM      Result Value Ref Range   ABO/RH(D) O POS     Antibody Screen NEG     Sample Expiration 07/25/2014      Patient Active Problem List   Diagnosis Date Noted  . Indication for care in labor or delivery 07/22/2014  . Rubella non-immune status, antepartum 02/18/2014  . Supervision of other normal pregnancy 02/17/2014  . Previous cesarean delivery affecting pregnancy, antepartum 02/17/2014  . UTI (urinary tract infection) in pregnancy in second trimester 02/17/2014    Assessment: Victoria Gordon is a 33 y.o. [id number] at [redacted]w[redacted]d here for contractions and possible ROM. No evidence of ROM with negative ferning. Will admit for PDIOL as scheduled for induction in 3 hours.   33 y.o. @GP1 @ at [redacted]w[redacted]d by L/13 in latent labor with Cat I strip.  #Labor: Will expectantly manage for 2 hours, then start pitocin as needed #Pain: IV pain medicines, then epidural #FWB:  Cat I, reassuring #ID:  GBS neg #MOF: Breast + formula #MOC: BTL, papers signed #Circ:  It's a girl!  Tula Nakayama 07/22/2014, 4:00 AM

## 2014-07-22 NOTE — Progress Notes (Signed)
I was present for the exam and agree with above.  [place], CNM 07/22/2014 11:00 AM

## 2014-07-22 NOTE — Op Note (Signed)
Victoria Gordon 07/22/2014  PREOPERATIVE DIAGNOSIS:  Multiparity, undesired fertility  POSTOPERATIVE DIAGNOSIS:  Multiparity, undesired fertility  PROCEDURE:  Postpartum Bilateral Tubal Sterilization using Filshie Clips   ANESTHESIA:  Epidural  COMPLICATIONS:  None immediate.  ESTIMATED BLOOD LOSS:  Less than 20 ml.  FLUIDS: 1000 ml LR.  URINE OUTPUT:  100 ml of clear urine.  SPECIMEN: portion left fallopian tube  INDICATIONS: 33 y.o. [id number]  with undesired fertility,status post vaginal delivery, desires permanent sterilization. Risks and benefits of procedure discussed with patient including permanence of method, bleeding, infection, injury to surrounding organs and need for additional procedures. Risk failure of 0.5-1% with increased risk of ectopic gestation if pregnancy occurs was also discussed with patient.   FINDINGS:  Normal uterus, tubes, and ovaries.  TECHNIQUE:  The patient was taken to the operating room where her epidural anesthesia was dosed up to surgical level and found to be adequate.  She was then placed in the dorsal supine position and prepped and draped in sterile fashion.  After an adequate timeout was performed, attention was turned to the patient's abdomen where a small transverse skin incision was made under the umbilical fold. The incision was taken down to the layer of fascia using the scalpel, and fascia was incised, and extended bilaterally using Mayo scissors. The peritoneum was entered in a sharp fashion. Attention was then turned to the patient's uterus, and left fallopian tube was identified and followed out to the fimbriated end.  A Filshie clip was placed on the right fallopian tube about 2 cm from the cornual attachment, with care given to incorporate the underlying mesosalpinx.  The left tube had distal adhesion to omentum and I elected to perform a distal salpingectomy by elevating the distal portion of the tube and applying double ligatures with  2-0 gut and excising with scissors.  Good hemostasis was noted overall.  Local analgesia was drizzled on both operative sites.The instruments were then removed from the patient's abdomen and the fascial incision was repaired with 0 Vicryl, and the skin was closed with a 4-0 Vicryl subcuticular stitch.Dermabond was applied. The patient tolerated the procedure well.  Sponge, lap, and needle counts were correct times two.  The patient was then taken to the recovery room awake, extubated and in stable condition.  Woodroe Mode, MD 07/22/2014 3:13 PM

## 2014-07-22 NOTE — Progress Notes (Signed)
Patient ID: Victoria Gordon, female   DOB: [birth year], 33 y.o.   MRN: [id number] S/P SVD no complications and requests BTL. The procedure and the risk of anesthesia, bleeding, infection, bowel and bladder injury, failure (1/200) and ectopic pregnancy were discussed and her questions were answered. The procedure will be scheduled today, remain NPO.  Woodroe Mode, MD 07/22/2014 1:03 PM

## 2014-07-22 NOTE — Progress Notes (Signed)
Tech attempted to collect specimen via cath.  Reported to me that the patient was too swollen and they were unable to collect specimen.  Reported off to oncoming RN, we will attempt to collect.

## 2014-07-22 NOTE — Progress Notes (Signed)
Called from OR and reported that patient's BTL was scheduled for 1330.  Orders placed, patient up to bathroom, voided, vital signs taken, bear hug gown placed on patient.

## 2014-07-22 NOTE — Lactation Note (Signed)
This note was copied from the chart of Victoria Gordon. Lactation Consultation Note  Patient Name: Victoria Gordon NIDPO'E Date: 07/22/2014 Reason for consult: Initial assessment Baby 12 hours of life. Assisted mom to latch baby to left breast in football position. Mom's nipples are flat, baby actively cueing to feed but cannot sustain a latch. Mom had bilateral tubal today and baby was given several bottles. Assisted mom to use hand pump to evert nipples. Slight eversion, but baby not able to latch. Fitted mom with a #16 NS. Discussed with mom that the NS is temporary and the goal is to evert mom's nipples with the shield and to have baby at breast while waiting to colostrum/milk to start to flow. Mom return-demonstrated hand expression, no colostrum visible. Baby crying and cueing, so parents return-demonstrated supplementing baby with nipple shield and curve-tipped syringe, 79mls of formula. Baby tolerated well and FOB assisted mom. Enc mom to continue to massage and hand express and to allow baby at breast as often as cues to feed. Discussed assessment and interventions with patient's MBU RN Mary Sella. Enc mom to call for assistance as needed.   Maternal Data Has patient been taught Hand Expression?: Yes Does the patient have breastfeeding experience prior to this delivery?: No  Feeding Feeding Type: Breast Fed Nipple Type: Slow - flow Length of feed: 0 min  LATCH Score/Interventions Latch: Too sleepy or reluctant, no latch achieved, no sucking elicited. Intervention(s): Skin to skin;Teach feeding cues;Waking techniques Intervention(s): Assist with latch;Breast massage;Breast compression;Adjust position  Audible Swallowing: None  Type of Nipple: Flat Intervention(s): Hand pump  Comfort (Breast/Nipple): Soft / non-tender     Hold (Positioning): Assistance needed to correctly position infant at breast and maintain latch.  LATCH Score: 4  Lactation Tools  Discussed/Used     Consult Status Consult Status: PRN    Inocente Salles 07/22/2014, 10:46 PM

## 2014-07-22 NOTE — Anesthesia Preprocedure Evaluation (Signed)
Anesthesia Evaluation  Patient identified by MRN, date of birth, ID band Patient awake    Reviewed: Allergy & Precautions, H&P , Patient's Chart, lab work & pertinent test results  Airway Mallampati: II  TM Distance: >3 FB Neck ROM: full    Dental  (+) Teeth Intact   Pulmonary  breath sounds clear to auscultation        Cardiovascular Rhythm:regular Rate:Normal     Neuro/Psych    GI/Hepatic   Endo/Other  Morbid obesity  Renal/GU      Musculoskeletal   Abdominal   Peds  Hematology   Anesthesia Other Findings       Reproductive/Obstetrics (+) Pregnancy                            Anesthesia Physical Anesthesia Plan  ASA: III  Anesthesia Plan: Epidural   Post-op Pain Management:    Induction:   Airway Management Planned:   Additional Equipment:   Intra-op Plan:   Post-operative Plan:   Informed Consent: I have reviewed the patients History and Physical, chart, labs and discussed the procedure including the risks, benefits and alternatives for the proposed anesthesia with the patient or authorized representative who has indicated his/her understanding and acceptance.   Dental Advisory Given  Plan Discussed with:   Anesthesia Plan Comments: (Labs checked- platelets confirmed with RN in room. Fetal heart tracing, per RN, reported to be stable enough for sitting procedure. Discussed epidural, and patient consents to the procedure:  included risk of possible headache,backache, failed block, allergic reaction, and nerve injury. This patient was asked if she had any questions or concerns before the procedure started.)       Anesthesia Quick Evaluation  

## 2014-07-22 NOTE — MAU Note (Signed)
Pt to be admitted to L&D.

## 2014-07-22 NOTE — Anesthesia Postprocedure Evaluation (Signed)
  Anesthesia Post-op Note  Patient: Victoria Gordon  Procedure(s) Performed: Procedure(s): POST PARTUM TUBAL LIGATION (Bilateral)  Patient is awake, responsive, moving her legs, and has signs of resolution of her numbness. Pain and nausea are reasonably well controlled. Vital signs are stable and clinically acceptable. Oxygen saturation is clinically acceptable. There are no apparent anesthetic complications at this time. Patient is ready for discharge.

## 2014-07-23 ENCOUNTER — Encounter (HOSPITAL_COMMUNITY): Payer: Self-pay | Admitting: Obstetrics & Gynecology

## 2014-07-23 DIAGNOSIS — E669 Obesity, unspecified: Secondary | ICD-10-CM

## 2014-07-23 DIAGNOSIS — O99214 Obesity complicating childbirth: Secondary | ICD-10-CM

## 2014-07-23 DIAGNOSIS — O480 Post-term pregnancy: Secondary | ICD-10-CM

## 2014-07-23 DIAGNOSIS — Z6841 Body Mass Index (BMI) 40.0 and over, adult: Secondary | ICD-10-CM

## 2014-07-23 LAB — COMPREHENSIVE METABOLIC PANEL
ALBUMIN: 2.4 g/dL — AB (ref 3.5–5.2)
ALK PHOS: 161 U/L — AB (ref 39–117)
ALT: 29 U/L (ref 0–35)
AST: 126 U/L — AB (ref 0–37)
Anion gap: 12 (ref 5–15)
BUN: 7 mg/dL (ref 6–23)
CALCIUM: 8.7 mg/dL (ref 8.4–10.5)
CO2: 23 mEq/L (ref 19–32)
Chloride: 103 mEq/L (ref 96–112)
Creatinine, Ser: 0.5 mg/dL (ref 0.50–1.10)
GFR calc Af Amer: >90 mL/min (ref >90)
GFR calc non Af Amer: >90 mL/min (ref >90)
Glucose, Bld: 83 mg/dL (ref 70–99)
Potassium: 4.3 mEq/L (ref 3.7–5.3)
SODIUM: 138 meq/L (ref 137–147)
TOTAL PROTEIN: 5.8 g/dL — AB (ref 6.0–8.3)
Total Bilirubin: 0.4 mg/dL (ref 0.3–1.2)

## 2014-07-23 LAB — CBC
HCT: 27.8 % — ABNORMAL LOW (ref 36.0–46.0)
Hemoglobin: 9.2 g/dL — ABNORMAL LOW (ref 12.0–15.0)
MCH: 31.5 pg (ref 26.0–34.0)
MCHC: 33.1 g/dL (ref 30.0–36.0)
MCV: 95.2 fL (ref 78.0–100.0)
PLATELETS: 193 10*3/uL (ref 150–400)
RBC: 2.92 MIL/uL — ABNORMAL LOW (ref 3.87–5.11)
RDW: 14.8 % (ref 11.5–15.5)
WBC: 11.3 10*3/uL — ABNORMAL HIGH (ref 4.0–10.5)

## 2014-07-23 NOTE — Anesthesia Postprocedure Evaluation (Signed)
Anesthesia Post Note  Patient: Victoria Gordon  Procedure(s) Performed: * No procedures listed *  Anesthesia type: Epidural  Patient location: Mother/Baby  Post pain: Pain level controlled  Post assessment: Post-op Vital signs reviewed  Last Vitals:  Filed Vitals:   07/23/14 0617  BP: 118/68  Pulse: 77  Temp: 36.9 C  Resp: 20    Post vital signs: Reviewed  Level of consciousness: awake  Complications: No apparent anesthesia complications

## 2014-07-23 NOTE — Progress Notes (Signed)
UR chart review completed.  

## 2014-07-23 NOTE — Progress Notes (Signed)
Post Partum Day 1 from a VBAC and BTL Subjective: no complaints, up ad lib, voiding, tolerating PO and + flatus Would like to go home today if possible. No headaches, change in vision, RUQ pain, or decreased urination.  Objective: Blood pressure 118/68, pulse 77, temperature 98.4 F (36.9 C), temperature source Oral, resp. rate 20, height 5\' 2"  (1.575 m), weight 98.884 kg (218 lb), last menstrual period 10/08/2013, SpO2 99.00%, unknown if currently breastfeeding.  Physical Exam:  General: alert, cooperative and no distress Lochia: appropriate Uterine Fundus: firm Incision: Small site at the umblilcus healing well, no significant drainage DVT Evaluation: No evidence of DVT seen on physical exam. Negative Homan's sign. No cords or calf tenderness. No significant calf/ankle edema.   Recent Labs  07/22/14 0150  HGB 10.7*  HCT 31.0*   Protein/Creatinine 0.98  Assessment/Plan: Breastfeeding S/p BTL Would like to go home today if possible: CMET and CBC pending. BPs have been wnl since delivery and patient is asymptomatic.   LOS: 1 day   Archie Patten 07/23/2014, 7:22 AM   OB fellow attestation Post Partum Day 1 s/p VBAC and POD#1 BTL I have seen and examined this patient and agree with above documentation in the resident's note.   Nica Sanita Estrada is a 33 y.o. [phone] s/p VBAC and BTL.  Pt denies problems with ambulating, voiding or po intake. Pain is well controlled.  Method of Feeding: breast and bottl3  PE:  BP 118/68  Pulse 77  Temp(Src) 98.4 F (36.9 C) (Oral)  Resp 20  Ht 5\' 2"  (1.575 m)  Wt 218 lb (98.884 kg)  BMI 39.86 kg/m2  SpO2 99%  LMP 10/08/2013  Breastfeeding? Unknown Fundus firm  Plan for discharge: today vs tomorrow.  Labs pending for possible ?preEclampsia, if labs improved and blood pressure remains wnl will discharge  Merla Riches, MD 9:19 AM

## 2014-07-23 NOTE — H&P (Signed)
Attestation of Attending Supervision of Obstetric Fellow: Evaluation and management procedures were performed by the Obstetric Fellow under my supervision and collaboration.  I have reviewed the Obstetric Fellow's note and chart, and I agree with the management and plan.  Jacob Stinson, DO Attending Physician Faculty Practice, [facility]  

## 2014-07-24 DIAGNOSIS — O34219 Maternal care for unspecified type scar from previous cesarean delivery: Secondary | ICD-10-CM

## 2014-07-24 MED ORDER — OXYCODONE-ACETAMINOPHEN 5-325 MG PO TABS: 1.0000 | ORAL_TABLET | Freq: Four times a day (QID) | ORAL | Status: DC | PRN

## 2014-07-24 MED ORDER — IBUPROFEN 600 MG PO TABS: 600.0000 mg | ORAL_TABLET | Freq: Four times a day (QID) | ORAL | Status: DC | PRN

## 2014-07-24 MED ORDER — DOCUSATE SODIUM 100 MG PO CAPS: 100.0000 mg | ORAL_CAPSULE | Freq: Two times a day (BID) | ORAL | Status: DC | PRN

## 2014-07-24 NOTE — Discharge Instructions (Signed)

## 2014-07-24 NOTE — Lactation Note (Addendum)
This note was copied from the chart of Victoria Gordon. Lactation Consultation Note  Patient Name: Victoria Gordon [id number] Date: 07/24/2014 Reason for consult: Follow-up assessment Per mom using a nipple shield . LC with moms permission checked the size for the nipple shield .  Mom handed LC a #20 NS  With a syringe. LC assessed breast tissue and noted the breast to be heavier , and the  Nipples bilaterally to be flat, right >left , areolas semi compressible, #20 NS boarderline fit , and #24 NS to big. Mom has been using a hand pump. LC recommended having a DEBP to give her breast more stimulation to get the milk Established and making the nipple more erect so the nipple shield fits better. Per mom is not active with WIC . LC discussed renting a DEBP , per dad unable to rent until next week . Dad mentioned maybe tomorrow we can come rent the pump. LC instructed mom on the use shells, and DEBP kit to be used manually until they can come rent. Lactation Plan of care - shells between feedings, prior to latch , breast massage , hand express, pre-pump with hand pump and apply nipple shield. LC discussed with mom makes a proper fit with a nipple shield and what isn't a good fit to assure milk flow for the Baby , if it is a good fit , instill EBM  or formula into the top with a curved tip syringe and latch . Plans on supplementing after feeding until the milk comes in. And post pump. LC suggested calling back for a LC F/U O/P apt.when her milk comes in .  Also calling WIC to get set up with a DEBP .  LC did stress to mom and dad baby needs to be fed every 3 hours and with feeding cues , and increase volume as needed.     Maternal Data Formula Feeding for Exclusion: Yes Reason for exclusion: Mother's choice to formula and breast feed on admission Has patient been taught Hand Expression?: Yes  Feeding    LATCH Score/Interventions                Intervention(s):  Breastfeeding basics reviewed (see LC note )     Lactation Tools Discussed/Used Tools: Nipple [place] Fuel;Shells;Pump;Comfort gels Nipple shield size: 20;24 (resized mom for Nipple shield , see LC note for details ) Shell Type: Inverted Breast pump type: Double-Electric Breast Pump (also has a hand pump ) WIC Program: No (per mom has to sign up ) Pump Review: Setup, frequency, and cleaning;Milk Storage Initiated by:: MAI  Date initiated:: 07/24/14   Consult Status Consult Status: Complete Date: 07/24/14    Myer Haff 07/24/2014, 11:17 AM

## 2014-07-24 NOTE — Discharge Summary (Signed)
Obstetric Discharge Summary Reason for Admission: onset of labor Prenatal Procedures: none  Intrapartum Procedures: spontaneous vaginal delivery Postpartum Procedures: P.P. tubal ligation Complications-Operative and Postpartum: vaginal laceration Hemoglobin  Date Value Ref Range Status  07/23/2014 9.2* 12.0 - 15.0 g/dL Final     HCT  Date Value Ref Range Status  07/23/2014 27.8* 36.0 - 46.0 % Final   Discharge Diagnoses: Term Pregnancy-delivered  Hospital Course:  Victoria Gordon is a 33 y.o. [id number] who presented with SOL .  She had a uncomplicated SVD.  During the intrapartum period she was noted to have elevated BPs. A urine cath specimen obtained postpartum noted protein/creatinine of 0.98. At that time, she was asymptomatic and her BPs were within normal limits. On the day of discharge, her BPs continued to be stable and her CMET showed improving AST and ALT. She was able to ambulate, tolerate PO and void normally. She was discharged home with instructions for postpartum care.    Delivery Note At 9:50 AM a viable and healthy female was delivered via Vaginal, Spontaneous Delivery (Presentation: Left Occiput Anterior). APGAR: 9, 9; weight pending post skin to skin .  Placenta status: Delivered Spontaneous shultz. Cord: 3 vessels with the following complications: None.   Anesthesia: Epidural  Episiotomy:  Lacerations: 1st degree;Vaginal  Suture Repair: 3.0 vicryl  Est. Blood Loss (mL): 300   Mom to postpartum. Baby to Couplet care / Skin to Skin.  Physical Exam:  General: alert, cooperative and no distress Lochia: appropriate Uterine Fundus: firm DVT Evaluation: No evidence of DVT seen on physical exam. Negative Homan's sign. No cords or calf tenderness. No significant calf/ankle edema.  Discharge Information: Date: 07/24/2014 Activity: pelvic rest Diet: routine Medications: PNV, Ibuprofen, Colace and Percocet Baby feeding: plans to breastfeed Contraception:  bilateral tubal ligation Condition: stable Instructions: refer to practice specific booklet Discharge to: home Follow-up Information   Follow up with [facility]. Call today. (to make a postpartum appointment in 4-6 weeks)    Contact information:   [place] [id number] [phone]      Newborn Data: Live born female  Birth Weight: 7 lb 9.8 oz (3453 g) APGAR: 9, 9  Home with mother.  Archie Patten, MD [facility] FM PGY-1 07/24/2014, 7:58 AM  I have seen this patient and agree with the above resident's note.  [place], [place] Certified Nurse-Midwife

## 2014-08-27 ENCOUNTER — Encounter: Payer: Self-pay | Admitting: Advanced Practice Midwife

## 2014-08-27 ENCOUNTER — Ambulatory Visit (INDEPENDENT_AMBULATORY_CARE_PROVIDER_SITE_OTHER): Payer: Medicaid Other | Admitting: Advanced Practice Midwife

## 2014-08-27 NOTE — Progress Notes (Signed)
  Victoria Gordon is a 33 y.o. who presents for a postpartum visit. She is 5 weeks postpartum following a spontaneous vaginal delivery. I have fully reviewed the prenatal and intrapartum course. The delivery was at 4 gestational weeks.  Anesthesia: epidural. Postpartum course has been uncomplicated. Baby's course has been uneventful. Baby is feeding by breast and bottle. Bleeding: no bleeding. Bowel function is normal. Bladder function is normal. Patient is sexually active. Contraception method is tubal ligation. Postpartum depression screening: negative.    Review of Systems   Constitutional: Negative for fever and chills Eyes: Negative for visual disturbances Respiratory: Negative for shortness of breath, dyspnea Cardiovascular: Negative for chest pain or palpitations  Gastrointestinal: Negative for vomiting, diarrhea and constipation Genitourinary: Negative for dysuria and urgency Musculoskeletal: Negative for back pain, joint pain, myalgias  Neurological: Negative for dizziness and headaches   Objective:     Filed Vitals:   08/27/14 1012  BP: 120/80   General:  alert, cooperative and no distress   Breasts:  negative  Lungs: clear to auscultation bilaterally  Heart:  regular rate and rhythm  Abdomen: Soft, nontender   Vulva:  normal  Vagina: normal vagina  Cervix:  closed  Corpus: Well involuted     Rectal Exam: no hemorrhoids        Assessment:    normal postpartum exam.  Plan:    1. Contraception: tubal ligation, done in hospital 2. Follow up in:   as needed.

## 2014-09-01 ENCOUNTER — Encounter: Payer: Self-pay | Admitting: Advanced Practice Midwife

## 2014-09-03 ENCOUNTER — Encounter: Payer: Self-pay | Admitting: *Deleted

## 2014-11-02 ENCOUNTER — Encounter (HOSPITAL_COMMUNITY): Payer: Self-pay

## 2014-11-02 ENCOUNTER — Inpatient Hospital Stay (HOSPITAL_COMMUNITY)
Admission: AD | Admit: 2014-11-02 | Discharge: 2014-11-03 | Disposition: A | Discharge status: Home / Self Care | Payer: Medicaid Other | Source: Ambulatory Visit | Attending: Obstetrics & Gynecology | Admitting: Obstetrics & Gynecology

## 2014-11-02 DIAGNOSIS — K642 Third degree hemorrhoids: Secondary | ICD-10-CM

## 2014-11-02 DIAGNOSIS — K644 Residual hemorrhoidal skin tags: Secondary | ICD-10-CM | POA: Insufficient documentation

## 2014-11-02 HISTORY — DX: Unspecified hemorrhoids: K64.9

## 2014-11-02 MED ORDER — HYDROCORTISONE 2.5 % RE CREA
TOPICAL_CREAM | Freq: Once | RECTAL | Status: AC
  Administered 2014-11-03: 01:00:00 via RECTAL
  Filled 2014-11-02: qty 28.35

## 2014-11-02 NOTE — MAU Provider Note (Signed)
  History     CSN: [id number]  Arrival date and time: 11/02/14 2101   First Provider Initiated Contact with Patient 11/02/14 2242      Chief Complaint  Patient presents with  . Hemorrhoids   HPI  Pt is a 34 yo [id number] female here with report of hemorrhoids since SVD in September 2015. Reports rectal bleeding approximately 2x/month since her delivery. Bleeding would occur after a bowel movement and seen in toilet.  Began to have rectal pain since yesterday. Pain is described as a sharp constant pain.  +rectal itching.  Last BM 10/31/14. Has not tried anything for hemorrhoids to date.      Past Medical History  Diagnosis Date  . Hemorrhoid     Past Surgical History  Procedure Laterality Date  . Cesarean section    . Mouth surgery    . Cholecystectomy    . Tubal ligation Bilateral 07/22/2014    Procedure: POST PARTUM TUBAL LIGATION;  Surgeon: Woodroe Mode, MD;  Location: [facility];  Service: Gynecology;  Laterality: Bilateral;    Family History  Problem Relation Age of Onset  . Diabetes Maternal Grandmother   . Diabetes Paternal Grandfather     History  Substance Use Topics  . Smoking status: Never Smoker   . Smokeless tobacco: Never Used  . Alcohol Use: No    Allergies: No Known Allergies  No prescriptions prior to admission    Review of Systems  Gastrointestinal: Positive for nausea and blood in stool. Negative for vomiting, diarrhea and constipation.  Genitourinary:       Rectal pain  All other systems reviewed and are negative.  Physical Exam   Filed Vitals:   11/02/14 2311  BP: 137/87  Pulse: 70  Temp:   Resp:     Blood pressure 167/104, pulse 74, temperature 98.6 F (37 C), temperature source Oral, resp. rate 18, height 5\' 2"  (1.575 m), weight 94.167 kg (207 lb 9.6 oz), last menstrual period 11/01/2014, SpO2 100 %, not currently breastfeeding.  Physical Exam  Constitutional: She is oriented to person, place, and time. She appears well-developed and  well-nourished.  Appears uncomfortable  HENT:  Head: Normocephalic.  Eyes: Pupils are equal, round, and reactive to light.  Neck: Normal range of motion. Neck supple.  Cardiovascular: Normal rate, regular rhythm and normal heart sounds.   Respiratory: Effort normal and breath sounds normal.  Genitourinary: External hemorrhoid: Multiple external hemorrhoids seen; none appear thrombosed; light pink in color; no active bleeding.  Neurological: She is alert and oriented to person, place, and time. She has normal reflexes.  Skin: Skin is warm and dry.    MAU Course  Procedures  Warm compress applied to rectum; Anusol HC applied > able to retract two hemorrhoids with ease > pt prefers to try the rest at home > pt given instructions on how to perform  Assessment and Plan  Hemorrhoids  Plan: Discharge to home RX hydrocortisone 2.5% cream OTC Preparation H Apply both to hemorrhoids after applying a warm compress, then gently reinsert hemorrhoids digitally. Information regarding hemorrhoids and prevention and treatment given.    Kathrine Haddock N 11/02/2014, 10:43 PM

## 2014-11-02 NOTE — MAU Note (Signed)
Hemorrhoids since SVD in September. Rectal bleeding intermittently since her delivery. Rectal pain since yesterday. Last BM 10/31/14.

## 2014-11-03 DIAGNOSIS — K642 Third degree hemorrhoids: Secondary | ICD-10-CM

## 2014-11-03 MED ORDER — HYDROCORTISONE 2.5 % EX CREA: TOPICAL_CREAM | CUTANEOUS | Status: DC

## 2014-11-03 NOTE — Discharge Instructions (Signed)

## 2016-01-28 ENCOUNTER — Encounter (HOSPITAL_COMMUNITY): Payer: Self-pay | Admitting: Emergency Medicine

## 2016-01-28 ENCOUNTER — Emergency Department (HOSPITAL_COMMUNITY)
Admission: EM | Admit: 2016-01-28 | Discharge: 2016-01-29 | Payer: Medicaid Other | Attending: Emergency Medicine | Admitting: Emergency Medicine

## 2016-01-28 DIAGNOSIS — F10129 Alcohol abuse with intoxication, unspecified: Secondary | ICD-10-CM | POA: Insufficient documentation

## 2016-01-28 DIAGNOSIS — IMO0002 Reserved for concepts with insufficient information to code with codable children: Secondary | ICD-10-CM

## 2016-01-28 DIAGNOSIS — E876 Hypokalemia: Secondary | ICD-10-CM | POA: Diagnosis not present

## 2016-01-28 DIAGNOSIS — R4589 Other symptoms and signs involving emotional state: Secondary | ICD-10-CM

## 2016-01-28 DIAGNOSIS — R45851 Suicidal ideations: Secondary | ICD-10-CM | POA: Insufficient documentation

## 2016-01-28 DIAGNOSIS — F29 Unspecified psychosis not due to a substance or known physiological condition: Secondary | ICD-10-CM | POA: Insufficient documentation

## 2016-01-28 DIAGNOSIS — F99 Mental disorder, not otherwise specified: Secondary | ICD-10-CM | POA: Diagnosis present

## 2016-01-28 DIAGNOSIS — R4689 Other symptoms and signs involving appearance and behavior: Secondary | ICD-10-CM

## 2016-01-28 LAB — COMPREHENSIVE METABOLIC PANEL
ALK PHOS: 138 U/L — AB (ref 38–126)
ALT: 33 U/L (ref 14–54)
AST: 75 U/L — AB (ref 15–41)
Albumin: 4.8 g/dL (ref 3.5–5.0)
Anion gap: 15 (ref 5–15)
BILIRUBIN TOTAL: 0.3 mg/dL (ref 0.3–1.2)
CALCIUM: 9 mg/dL (ref 8.9–10.3)
CO2: 20 mmol/L — AB (ref 22–32)
Chloride: 108 mmol/L (ref 101–111)
Creatinine, Ser: 0.59 mg/dL (ref 0.44–1.00)
GFR calc Af Amer: >60 mL/min (ref >60)
GFR calc non Af Amer: >60 mL/min (ref >60)
GLUCOSE: 146 mg/dL — AB (ref 65–99)
Potassium: 2.8 mmol/L — ABNORMAL LOW (ref 3.5–5.1)
SODIUM: 143 mmol/L (ref 135–145)
Total Protein: 8.4 g/dL — ABNORMAL HIGH (ref 6.5–8.1)

## 2016-01-28 LAB — RAPID URINE DRUG SCREEN, HOSP PERFORMED
Amphetamines: NOT DETECTED
Barbiturates: NOT DETECTED
Benzodiazepines: NOT DETECTED
COCAINE: NOT DETECTED
Opiates: NOT DETECTED
TETRAHYDROCANNABINOL: NOT DETECTED

## 2016-01-28 LAB — CBC
HEMATOCRIT: 38.7 % (ref 36.0–46.0)
Hemoglobin: 13.3 g/dL (ref 12.0–15.0)
MCH: 29.9 pg (ref 26.0–34.0)
MCHC: 34.4 g/dL (ref 30.0–36.0)
MCV: 87 fL (ref 78.0–100.0)
Platelets: 364 10*3/uL (ref 150–400)
RBC: 4.45 MIL/uL (ref 3.87–5.11)
RDW: 15.1 % (ref 11.5–15.5)
WBC: 12.2 10*3/uL — ABNORMAL HIGH (ref 4.0–10.5)

## 2016-01-28 LAB — SALICYLATE LEVEL: Salicylate Lvl: <4 mg/dL (ref 2.8–30.0)

## 2016-01-28 LAB — ACETAMINOPHEN LEVEL: Acetaminophen (Tylenol), Serum: <10 ug/mL — ABNORMAL LOW (ref 10–30)

## 2016-01-28 LAB — ETHANOL: Alcohol, Ethyl (B): 339 mg/dL (ref <5)

## 2016-01-28 MED ORDER — POTASSIUM CHLORIDE CRYS ER 20 MEQ PO TBCR
40.0000 meq | EXTENDED_RELEASE_TABLET | Freq: Three times a day (TID) | ORAL | Status: DC
  Administered 2016-01-28 – 2016-01-29 (×2): 40 meq via ORAL
  Filled 2016-01-28 (×2): qty 2

## 2016-01-28 MED ORDER — DIPHENHYDRAMINE HCL 50 MG/ML IJ SOLN
25.0000 mg | Freq: Once | INTRAMUSCULAR | Status: AC
  Administered 2016-01-28: 25 mg via INTRAMUSCULAR
  Filled 2016-01-28: qty 1

## 2016-01-28 MED ORDER — ZIPRASIDONE MESYLATE 20 MG IM SOLR
10.0000 mg | Freq: Once | INTRAMUSCULAR | Status: AC
  Administered 2016-01-28: 10 mg via INTRAMUSCULAR

## 2016-01-28 MED ORDER — LORAZEPAM 2 MG/ML IJ SOLN
2.0000 mg | Freq: Once | INTRAMUSCULAR | Status: AC
  Administered 2016-01-28: 2 mg via INTRAMUSCULAR
  Filled 2016-01-28: qty 1
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
## 2016-01-29 ENCOUNTER — Encounter (HOSPITAL_COMMUNITY): Payer: Self-pay | Admitting: *Deleted

## 2016-01-29 ENCOUNTER — Inpatient Hospital Stay (HOSPITAL_COMMUNITY)
Admission: AD | Admit: 2016-01-29 | Discharge: 2016-02-05 | DRG: 885 | Disposition: A | Discharge status: Home / Self Care | Payer: No Typology Code available for payment source | Attending: Psychiatry | Admitting: Psychiatry

## 2016-01-29 DIAGNOSIS — F1020 Alcohol dependence, uncomplicated: Secondary | ICD-10-CM | POA: Diagnosis present

## 2016-01-29 DIAGNOSIS — E876 Hypokalemia: Secondary | ICD-10-CM | POA: Diagnosis not present

## 2016-01-29 DIAGNOSIS — G4700 Insomnia, unspecified: Secondary | ICD-10-CM | POA: Diagnosis present

## 2016-01-29 DIAGNOSIS — F4310 Post-traumatic stress disorder, unspecified: Secondary | ICD-10-CM | POA: Clinically undetermined

## 2016-01-29 DIAGNOSIS — R45851 Suicidal ideations: Secondary | ICD-10-CM | POA: Diagnosis present

## 2016-01-29 DIAGNOSIS — Y908 Blood alcohol level of 240 mg/100 ml or more: Secondary | ICD-10-CM | POA: Diagnosis present

## 2016-01-29 DIAGNOSIS — F332 Major depressive disorder, recurrent severe without psychotic features: Secondary | ICD-10-CM | POA: Diagnosis not present

## 2016-01-29 DIAGNOSIS — F322 Major depressive disorder, single episode, severe without psychotic features: Secondary | ICD-10-CM | POA: Diagnosis present

## 2016-01-29 DIAGNOSIS — E519 Thiamine deficiency, unspecified: Secondary | ICD-10-CM | POA: Diagnosis present

## 2016-01-29 MED ORDER — LOPERAMIDE HCL 2 MG PO CAPS: 2.0000 mg | ORAL_CAPSULE | ORAL | Status: AC | PRN

## 2016-01-29 MED ORDER — VITAMIN B-1 100 MG PO TABS
100.0000 mg | ORAL_TABLET | Freq: Every day | ORAL | Status: DC
  Administered 2016-01-30 – 2016-02-05 (×7): 100 mg via ORAL
  Filled 2016-01-29 (×9): qty 1

## 2016-01-29 MED ORDER — ONDANSETRON 4 MG PO TBDP: 4.0000 mg | ORAL_TABLET | Freq: Four times a day (QID) | ORAL | Status: AC | PRN

## 2016-01-29 MED ORDER — HYDROXYZINE HCL 25 MG PO TABS: 25.0000 mg | ORAL_TABLET | Freq: Four times a day (QID) | ORAL | Status: AC | PRN

## 2016-01-29 MED ORDER — LORAZEPAM 1 MG PO TABS
1.0000 mg | ORAL_TABLET | Freq: Four times a day (QID) | ORAL | Status: AC
  Administered 2016-01-29 – 2016-01-30 (×6): 1 mg via ORAL
  Filled 2016-01-29 (×5): qty 1

## 2016-01-29 MED ORDER — ADULT MULTIVITAMIN W/MINERALS CH
1.0000 | ORAL_TABLET | Freq: Every day | ORAL | Status: DC
  Administered 2016-01-29 – 2016-02-05 (×8): 1 via ORAL
  Filled 2016-01-29 (×11): qty 1

## 2016-01-29 MED ORDER — THIAMINE HCL 100 MG/ML IJ SOLN
100.0000 mg | Freq: Once | INTRAMUSCULAR | Status: AC
  Administered 2016-01-29: 100 mg via INTRAMUSCULAR
  Filled 2016-01-29: qty 2

## 2016-01-29 MED ORDER — PNEUMOCOCCAL VAC POLYVALENT 25 MCG/0.5ML IJ INJ
0.5000 mL | INJECTION | INTRAMUSCULAR | Status: AC
  Administered 2016-01-30: 0.5 mL via INTRAMUSCULAR

## 2016-01-29 MED ORDER — LORAZEPAM 1 MG PO TABS
1.0000 mg | ORAL_TABLET | Freq: Two times a day (BID) | ORAL | Status: AC
  Administered 2016-02-01 (×2): 1 mg via ORAL
  Filled 2016-01-29 (×2): qty 1

## 2016-01-29 MED ORDER — TRAZODONE HCL 50 MG PO TABS
50.0000 mg | ORAL_TABLET | Freq: Every day | ORAL | Status: DC
  Administered 2016-01-29: 50 mg via ORAL
  Filled 2016-01-29 (×3): qty 1

## 2016-01-29 MED ORDER — LORAZEPAM 1 MG PO TABS
1.0000 mg | ORAL_TABLET | Freq: Four times a day (QID) | ORAL | Status: AC | PRN
  Filled 2016-01-29: qty 1

## 2016-01-29 MED ORDER — LORAZEPAM 1 MG PO TABS
1.0000 mg | ORAL_TABLET | Freq: Three times a day (TID) | ORAL | Status: AC
  Administered 2016-01-31 (×3): 1 mg via ORAL
  Filled 2016-01-29 (×3): qty 1

## 2016-01-29 MED ORDER — LORAZEPAM 1 MG PO TABS
1.0000 mg | ORAL_TABLET | Freq: Every day | ORAL | Status: AC
  Administered 2016-02-02: 1 mg via ORAL
  Filled 2016-01-29: qty 1

## 2016-01-29 NOTE — Progress Notes (Signed)
D: Patient observed in room lying on bed with eyes open. Patient did interact with this Probation officer stated her goal is to "get sober and help with housing." Patient sad and tearful. Support offered. A: Support and encouragement offered. Q 15 minute checks in progress and maintained for safety." R: Patient remains safe on unit.
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
## 2016-01-29 NOTE — Progress Notes (Signed)
This Probation officer spoke with Pamala Hurry ED Network engineer as she agreed to coordinate with staff to set up the machine for this consult to be completed.    Chesley Noon, MSW, Darlyn Read [facility] Triage Specialist [phone]

## 2016-01-29 NOTE — Tx Team (Signed)
Initial Interdisciplinary Treatment Plan   PATIENT STRESSORS: Financial difficulties Legal issue Marital or family conflict Occupational concerns Substance abuse   PATIENT STRENGTHS: Ability for insight Communication skills Motivation for treatment/growth Physical Health   PROBLEM LIST: Problem List/Patient Goals Date to be addressed Date deferred Reason deferred Estimated date of resolution  Suicide 01/29/2016  01/29/2016   D/C  Depression 01/29/2016  01/29/2016   D/C  Substance Abuse 01/29/2016  01/29/2016   D/C  "Don't want to feel like this. Don't want the alcohol" 01/29/2016  01/29/2016   D/C  "I want my kids" 01/29/2016  01/29/2016   D/C  "No support system" 01/29/2016  01/29/2016  D/C                     DISCHARGE CRITERIA:  Improved stabilization in mood, thinking, and/or behavior Medical problems require only outpatient monitoring Motivation to continue treatment in a less acute level of care Need for constant or close observation no longer present Reduction of life-threatening or endangering symptoms to within safe limits Withdrawal symptoms are absent or subacute and managed without 24-hour nursing intervention  PRELIMINARY DISCHARGE PLAN: Attend 12-step recovery group Outpatient therapy Participate in family therapy Return to previous living arrangement  PATIENT/FAMIILY INVOLVEMENT: This treatment plan has been presented to and reviewed with the patient, Victoria Gordon.  The patient and family have been given the opportunity to ask questions and make suggestions.  Victoria Gordon 01/29/2016, 6:34 PM

## 2016-01-29 NOTE — Progress Notes (Signed)
Patient attended wrap-up group and rated her day a 4. Goal was to get some help that is why she came here.

## 2016-01-29 NOTE — ED Provider Notes (Signed)
Patient accepted to [facility] by Dr Sabra Heck.  BP 139/89 mmHg  Pulse 89  Temp(Src) 98.4 F (36.9 C) (Oral)  Resp 18  Ht 5\' 5"  (1.651 m)  Wt 200 lb (90.719 kg)  BMI 33.28 kg/m2  SpO2 94%   Ezequiel Essex, MD 01/29/16 1339

## 2016-01-29 NOTE — Progress Notes (Signed)
This patient was accepted to [facility] bed 306-1 per Randall Hiss, [facility] and can transfer after 12pm.  Call report to [phone].    Chesley Noon, MSW, Darlyn Read [facility] Triage Specialist [phone]

## 2016-01-29 NOTE — ED Notes (Signed)
Per Claudette Head at [facility]- pt meets criteria for inpatient placement.

## 2016-01-29 NOTE — ED Notes (Signed)
CPS at [place] Ext. [phone]

## 2016-01-29 NOTE — ED Notes (Signed)
TTS complete. CPS at bedside.
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
## 2016-01-30 ENCOUNTER — Encounter (HOSPITAL_COMMUNITY): Payer: Self-pay | Admitting: Psychiatry

## 2016-01-30 DIAGNOSIS — F4310 Post-traumatic stress disorder, unspecified: Secondary | ICD-10-CM | POA: Clinically undetermined

## 2016-01-30 DIAGNOSIS — F332 Major depressive disorder, recurrent severe without psychotic features: Secondary | ICD-10-CM | POA: Diagnosis present

## 2016-01-30 DIAGNOSIS — E876 Hypokalemia: Secondary | ICD-10-CM | POA: Diagnosis present

## 2016-01-30 DIAGNOSIS — F1020 Alcohol dependence, uncomplicated: Secondary | ICD-10-CM | POA: Diagnosis present

## 2016-01-30 MED ORDER — ALUM & MAG HYDROXIDE-SIMETH 200-200-20 MG/5ML PO SUSP: 30.0000 mL | ORAL | Status: DC | PRN

## 2016-01-30 MED ORDER — LISINOPRIL 10 MG PO TABS
10.0000 mg | ORAL_TABLET | Freq: Every day | ORAL | Status: DC
Start: 2016-01-30 — End: 2016-02-06
  Administered 2016-01-30 – 2016-02-05 (×7): 10 mg via ORAL
  Filled 2016-01-30 (×7): qty 1
  Filled 2016-01-30: qty 7
  Filled 2016-01-30: qty 1

## 2016-01-30 MED ORDER — SERTRALINE HCL 100 MG PO TABS
100.0000 mg | ORAL_TABLET | Freq: Every day | ORAL | Status: DC
  Administered 2016-01-30 – 2016-02-05 (×7): 100 mg via ORAL
  Filled 2016-01-30 (×2): qty 1
  Filled 2016-01-30: qty 7
  Filled 2016-01-30 (×8): qty 1

## 2016-01-30 MED ORDER — ACETAMINOPHEN 325 MG PO TABS: 650.0000 mg | ORAL_TABLET | Freq: Four times a day (QID) | ORAL | Status: DC | PRN

## 2016-01-30 MED ORDER — RISPERIDONE 0.5 MG PO TABS
1.5000 mg | ORAL_TABLET | Freq: Every day | ORAL | Status: DC
  Administered 2016-01-30 – 2016-02-01 (×3): 1.5 mg via ORAL
  Filled 2016-01-30 (×5): qty 3

## 2016-01-30 MED ORDER — MAGNESIUM HYDROXIDE 400 MG/5ML PO SUSP: 30.0000 mL | Freq: Every day | ORAL | Status: DC | PRN

## 2016-01-30 MED ORDER — NICOTINE 21 MG/24HR TD PT24
21.0000 mg | MEDICATED_PATCH | Freq: Every day | TRANSDERMAL | Status: DC
  Administered 2016-01-30: 21 mg via TRANSDERMAL
  Filled 2016-01-30 (×6): qty 1

## 2016-01-30 MED ORDER — TRAZODONE HCL 100 MG PO TABS
100.0000 mg | ORAL_TABLET | Freq: Every day | ORAL | Status: DC
  Administered 2016-01-30 – 2016-02-02 (×4): 100 mg via ORAL
  Filled 2016-01-30 (×6): qty 1

## 2016-01-30 MED ORDER — LISINOPRIL 10 MG PO TABS
10.0000 mg | ORAL_TABLET | Freq: Every day | ORAL | Status: DC
  Filled 2016-01-30: qty 1

## 2016-01-30 NOTE — Progress Notes (Signed)
Patient did attend the evening speaker AA meeting.  

## 2016-01-30 NOTE — BHH Group Notes (Signed)
Adult Therapy Group Note - Clinical Social Work  Date:  01/30/2016 Time:  10:00-11:00AM  Group Topic/Focus:  Healthy Coping Skills  Additional Comments:  The main focus of today's process group was to discuss patient-specific behaviors that have prevented them from living the life they want.  The reasons underlying these behaviors were touched on lightly.  How to make a plan to promote learning how to use different behaviors was then explored fully.  Pt reported that drinking, giving up on herself, and scratching herself to self-harm are three behaviors she uses that keeps her from living the life she wants.  She was in and out of the room a couple of times, did not seem engaged.  Participation Level:  Minimal  Participation Quality:  Inattentive  Affect:  Flat  Cognitive:  Disorganized  Insight: Limited  Engagement in Group:  Limited  Modes of Intervention:  Clarification and Discussion  Lysle Dingwall 01/30/2016, 12:47 PM
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
## 2016-01-31 DIAGNOSIS — F1020 Alcohol dependence, uncomplicated: Secondary | ICD-10-CM

## 2016-01-31 DIAGNOSIS — E876 Hypokalemia: Secondary | ICD-10-CM

## 2016-01-31 DIAGNOSIS — F4310 Post-traumatic stress disorder, unspecified: Secondary | ICD-10-CM

## 2016-01-31 DIAGNOSIS — F332 Major depressive disorder, recurrent severe without psychotic features: Principal | ICD-10-CM

## 2016-01-31 LAB — COMPREHENSIVE METABOLIC PANEL
ALT: 38 U/L (ref 14–54)
AST: 79 U/L — ABNORMAL HIGH (ref 15–41)
Albumin: 4.3 g/dL (ref 3.5–5.0)
Alkaline Phosphatase: 99 U/L (ref 38–126)
Anion gap: 9 (ref 5–15)
BILIRUBIN TOTAL: 0.5 mg/dL (ref 0.3–1.2)
BUN: 6 mg/dL (ref 6–20)
CHLORIDE: 105 mmol/L (ref 101–111)
CO2: 26 mmol/L (ref 22–32)
CREATININE: 0.51 mg/dL (ref 0.44–1.00)
Calcium: 9.2 mg/dL (ref 8.9–10.3)
Glucose, Bld: 102 mg/dL — ABNORMAL HIGH (ref 65–99)
POTASSIUM: 3.5 mmol/L (ref 3.5–5.1)
Sodium: 140 mmol/L (ref 135–145)
TOTAL PROTEIN: 7.5 g/dL (ref 6.5–8.1)

## 2016-01-31 MED ORDER — BENZTROPINE MESYLATE 0.5 MG PO TABS
0.5000 mg | ORAL_TABLET | Freq: Every day | ORAL | Status: DC
  Administered 2016-01-31 – 2016-02-01 (×2): 0.5 mg via ORAL
  Filled 2016-01-31 (×5): qty 1

## 2016-01-31 NOTE — Progress Notes (Signed)
D: Patient observed in milieu watching television. Patient states her goal for the day was to "stay sober." Patient attended [place] meeting tonight. Support offered. A: Support and encouragement offered. Q 15 minute checks in progress and maintained for safety. R: Patient remains safe on unit.

## 2016-01-31 NOTE — Progress Notes (Signed)
Patient ID: Victoria Gordon, female   DOB: April 17, 1981, 35 y.o.   MRN: [id number]  Adult Psychoeducational Group Note  Date:  01/31/2016 Time: 01:45 pm  Group Topic/Focus:  Identifying Needs:   The focus of this group is to help patients identify their personal needs that have been historically problematic and identify healthy behaviors to address their needs.  Participation Level:  Active  Participation Quality:  Minimal  Affect:  Appropriate  Cognitive:  Appropriate  Insight: Minimal  Engagement in Group:  Improving  Modes of Intervention:  Activity, Discussion, Education and Support  Additional Comments:  Pt chose not to verbalize a need to the group.   Elenore Rota 01/31/2016, 2:10 PM

## 2016-01-31 NOTE — Progress Notes (Signed)
Patient ID: Victoria Gordon, female   DOB: 1980/11/24, 35 y.o.   MRN: [id number]   Pt currently presents with a flat affect and cooperative behavior.Per self inventory, pt rates depression, hopelessness and anxiety at an 8. Pt's daily goal is to "be sober" and they intend to do so by "be here." Pt reports fair sleep, a fair appetite, low energy and good concentration. Pt forwards little to writer, remains in dayroom but interacts minimally with other pattients.   Pt provided with medications per providers orders. Pt's labs and vitals were monitored throughout the day. Pt supported emotionally and encouraged to express concerns and questions. Pt educated on medications.  Pt's safety ensured with 15 minute and environmental checks. Pt currently denies SI/HI and A/V hallucinations. Pt verbally agrees to seek staff if SI/HI or A/VH occurs and to consult with staff before acting on these thoughts. Will continue POC.

## 2016-01-31 NOTE — [facility] Suicide Risk Assessment (Signed)
[facility] Adult Inpatient Family/Significant Other Suicide Prevention Education  Suicide Prevention Education:   Patient Refusal for Family/Significant Other Suicide Prevention Education: The patient has refused to provide written consent for family/significant other to be provided Family/Significant Other Suicide Prevention Education during admission and/or prior to discharge.  Physician notified.  CSW provided suicide prevention information with patient.    The suicide prevention education provided includes the following:  Suicide risk factors  Suicide prevention and interventions  National Suicide Hotline telephone number  [facility] assessment telephone number  [facility] Emergency Assistance [place] and/or Residential Mobile Crisis Unit telephone number   Theressa Millard, [place] 01/31/2016 9:26 AM

## 2016-01-31 NOTE — Progress Notes (Signed)
Patient did attend the evening speaker AA meeting.  
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
## 2016-01-31 NOTE — Progress Notes (Signed)
[facility] MD Progress Note  01/31/2016 12:54 PM Victoria Gordon  MRN:  [id number] Subjective: Pt states " I am fine.'  Objective:Victoria Gordon is a 35 year old Hispanic female, who has a hx of depression and alcohol abuse , who presented to [facility] from the [facility] with complaints of suicidal ideations & attempt by drinking alcohol & attempted to jump off a bridge.   Patient seen and chart reviewed.Discussed patient with treatment team.  Pt today seen as depressed, anxious , however reports she is improving. Pt continues to have some withdrawal sx like diarrhea and restlessness. Reviewed CIWA/VS. Pt advised to make use of PRN medications. Pt denies other concerns and is observed as visible in milieu.     Principal Problem: MDD (major depressive disorder), recurrent severe, without psychosis ([place]) Diagnosis:   Patient Active Problem List   Diagnosis Date Noted  . Alcohol use disorder, severe, dependence ([place]) [F10.20] 01/30/2016  . PTSD (post-traumatic stress disorder) [F43.10] 01/30/2016  . MDD (major depressive disorder), recurrent severe, without psychosis ([place]) [F33.2] 01/30/2016  . Hypokalemia [E87.6] 01/30/2016  . Rubella non-immune status, antepartum [O99.89, Z28.3] 02/18/2014  . UTI (urinary tract infection) in pregnancy in second trimester [O23.42] 02/17/2014   Total Time spent with patient: 20 minutes  Past Psychiatric History: Please see H&P.   Past Medical History:  Past Medical History  Diagnosis Date  . Hemorrhoid     Past Surgical History  Procedure Laterality Date  . Cesarean section    . Mouth surgery    . Cholecystectomy    . Tubal ligation Bilateral 07/22/2014    Procedure: POST PARTUM TUBAL LIGATION;  Surgeon: Woodroe Mode, MD;  Location: [facility];  Service: Gynecology;  Laterality: Bilateral;   Family History:  Family History  Problem Relation Age of Onset  . Diabetes Maternal Grandmother   . Diabetes Paternal Grandfather    Family Psychiatric   History: Please see H&P.  Social History:  History  Alcohol Use No     History  Drug Use No    Social History   Social History  . Marital Status: Single    Spouse Name: N/A  . Number of Children: N/A  . Years of Education: N/A   Social History Main Topics  . Smoking status: Never Smoker   . Smokeless tobacco: Never Used  . Alcohol Use: No  . Drug Use: No  . Sexual Activity: Yes    Birth Control/ Protection: None   Other Topics Concern  . None   Social History Narrative   Additional Social History:                         Sleep: Fair  Appetite:  Fair  Current Medications: Current Facility-Administered Medications  Medication Dose Route Frequency Provider Last Rate Last Dose  . acetaminophen (TYLENOL) tablet 650 mg  650 mg Oral Q6H PRN Encarnacion Slates, NP      . alum & mag hydroxide-simeth (MAALOX/MYLANTA) 200-200-20 MG/5ML suspension 30 mL  30 mL Oral Q4H PRN Encarnacion Slates, NP      . hydrOXYzine (ATARAX/VISTARIL) tablet 25 mg  25 mg Oral Q6H PRN Encarnacion Slates, NP      . lisinopril (PRINIVIL,ZESTRIL) tablet 10 mg  10 mg Oral Daily Encarnacion Slates, NP   10 mg at 01/31/16 0829  . loperamide (IMODIUM) capsule 2-4 mg  2-4 mg Oral PRN Encarnacion Slates, NP      .  LORazepam (ATIVAN) tablet 1 mg  1 mg Oral Q6H PRN Encarnacion Slates, NP      . LORazepam (ATIVAN) tablet 1 mg  1 mg Oral TID Encarnacion Slates, NP   1 mg at 01/31/16 1154   Followed by  . [START ON 02/01/2016] LORazepam (ATIVAN) tablet 1 mg  1 mg Oral BID Encarnacion Slates, NP       Followed by  . [START ON 02/02/2016] LORazepam (ATIVAN) tablet 1 mg  1 mg Oral Daily Encarnacion Slates, NP      . magnesium hydroxide (MILK OF MAGNESIA) suspension 30 mL  30 mL Oral Daily PRN Encarnacion Slates, NP      . multivitamin with minerals tablet 1 tablet  1 tablet Oral Daily Encarnacion Slates, NP   1 tablet at 01/31/16 0829  . nicotine (NICODERM CQ - dosed in mg/24 hours) patch 21 mg  21 mg Transdermal Q0600 Encarnacion Slates, NP   21 mg at 01/30/16 0945   . ondansetron (ZOFRAN-ODT) disintegrating tablet 4 mg  4 mg Oral Q6H PRN Encarnacion Slates, NP      . risperiDONE (RISPERDAL) tablet 1.5 mg  1.5 mg Oral QHS Encarnacion Slates, NP   1.5 mg at 01/30/16 2206  . sertraline (ZOLOFT) tablet 100 mg  100 mg Oral Daily Encarnacion Slates, NP   100 mg at 01/31/16 2683  . thiamine (VITAMIN B-1) tablet 100 mg  100 mg Oral Daily Encarnacion Slates, NP   100 mg at 01/31/16 0829  . traZODone (DESYREL) tablet 100 mg  100 mg Oral QHS Encarnacion Slates, NP   100 mg at 01/30/16 2206    Lab Results:  Results for orders placed or performed during the hospital encounter of 01/29/16 (from the past 48 hour(s))  Comprehensive metabolic panel     Status: Abnormal   Collection Time: 01/31/16  6:30 AM  Result Value Ref Range   Sodium 140 135 - 145 mmol/L   Potassium 3.5 3.5 - 5.1 mmol/L   Chloride 105 101 - 111 mmol/L   CO2 26 22 - 32 mmol/L   Glucose, Bld 102 (H) 65 - 99 mg/dL   BUN 6 6 - 20 mg/dL   Creatinine, Ser 0.51 0.44 - 1.00 mg/dL   Calcium 9.2 8.9 - 10.3 mg/dL   Total Protein 7.5 6.5 - 8.1 g/dL   Albumin 4.3 3.5 - 5.0 g/dL   AST 79 (H) 15 - 41 U/L   ALT 38 14 - 54 U/L   Alkaline Phosphatase 99 38 - 126 U/L   Total Bilirubin 0.5 0.3 - 1.2 mg/dL   GFR calc non Af Amer >60 >60 mL/min   GFR calc Af Amer >60 >60 mL/min    Comment: (NOTE) The eGFR has been calculated using the CKD EPI equation. This calculation has not been validated in all clinical situations. eGFR's persistently <60 mL/min signify possible Chronic Kidney Disease.    Anion gap 9 5 - 15    Comment: Performed at [facility]    Blood Alcohol level:  Lab Results  Component Value Date   ETH 339* 01/28/2016   [facility]  01/05/2010    <5        LOWEST DETECTABLE LIMIT FOR SERUM ALCOHOL IS 5 mg/dL FOR MEDICAL PURPOSES ONLY    Physical Findings: AIMS: Facial and Oral Movements Muscles of Facial Expression: None, normal Lips and Perioral Area: None, normal Jaw: None, normal Tongue:  None, normal,Extremity Movements Upper (arms, wrists, hands, fingers): None, normal Lower (legs, knees, ankles, toes): None, normal, Trunk Movements Neck, shoulders, hips: None, normal, Overall Severity Severity of abnormal movements (highest score from questions above): None, normal Incapacitation due to abnormal movements: None, normal Patient's awareness of abnormal movements (rate only patient's report): No Awareness, Dental Status Current problems with teeth and/or dentures?: No Does patient usually wear dentures?: No  CIWA:  CIWA-Ar Total: 4 COWS:     Musculoskeletal: Strength & Muscle Tone: within normal limits Gait & Station: normal Patient leans: N/A  Psychiatric Specialty Exam: Review of Systems  Gastrointestinal: Positive for diarrhea.  Neurological: Positive for tremors.  Psychiatric/Behavioral: Positive for depression and substance abuse.  All other systems reviewed and are negative.   Blood pressure 126/86, pulse 93, temperature 98.4 F (36.9 C), temperature source Oral, resp. rate 20, height _0  (1.651 m), weight 90 kg (198 lb 6.6 oz), not currently breastfeeding.Body mass index is 33.02 kg/(m^2).  General Appearance: Disheveled  Eye Contact::  Poor  Speech:  Slow  Volume:  Decreased  Mood:  Anxious and Depressed  Affect:  Depressed  Thought Process:  Goal Directed  Orientation:  Full (Time, Place, and Person)  Thought Content:  Rumination  Suicidal Thoughts:  No  Homicidal Thoughts:  No  Memory:  Immediate;   Fair Recent;   Fair Remote;   Fair  Judgement:  Impaired  Insight:  Shallow  Psychomotor Activity:  Tremor  Concentration:  Poor  Recall:  [employer] of Knowledge:Fair  Language: Fair  Akathisia:  No  Handed:  Right  AIMS (if indicated):     Assets:  Desire for Improvement  ADL's:  Intact  Cognition: WNL  Sleep:  Number of Hours: 6.5   Treatment Plan Summary:Ogechi is a 35 year old Hispanic female, who has a hx of depression and alcohol  abuse , who presented to [facility] from the [facility] with complaints of suicidal ideations & attempt by drinking alcohol & attempted to jump off a bridge. Pt continues to have anxiety as well as withdrawal sx. Will continue to observe.  Daily contact with patient to assess and evaluate symptoms and progress in treatment and Medication management  Reviewed past medical records,treatment plan.  Will continue CIWA/ativan protocol for withdrawal sx. Will continue Zoloft 100 mg po daily for affective sx. Will continue Trazodone 100 mg po qhs for sleep. Will continue Risperidone 1.5 mg po qhs to augment the effect of Zoloft. Will add Cogentin 0.5 mg po qhs for EPS. Reviewed labs - K+ is wnl. Encourage PO fluids , bland diet and PRN medications for diarrhea. CSW will continue working on disposition.  Patient to participate in therapeutic milieu .    Demetrio Leighty, MD 01/31/2016, 12:54 PM

## 2016-01-31 NOTE — BHH Group Notes (Signed)
[place] Group Notes:  (Clinical Social Work)  01/31/2016  10:00-11:00AM  Summary of Progress/Problems:   The main focus of today's process group was to   1)  discuss the importance of adding supports  2)  define health supports versus unhealthy supports  3)  identify the patient's current unhealthy supports and plan how to handle them  4)  Identify the patient's current healthy supports and plan what to add.  An emphasis was placed on using counselor, doctor, therapy groups, 12-step groups, and problem-specific support groups to expand supports.    The patient expressed full comprehension of the concepts presented, and agreed that there is a need to add more supports.  The patient stated she has no supports at all in her life, that she has burned too many bridges.  She did not talk much, but was insightful when she was called on directly.  Type of Therapy:  Process Group with Motivational Interviewing  Participation Level:  Active  Participation Quality:  Attentive  Affect:  Blunted and Depressed  Cognitive:  Appropriate  Insight:  Developing/Improving  Engagement in Therapy:  Engaged  Modes of Intervention:   Education, Support and Processing, Activity  Selmer Dominion, LCSW 01/31/2016

## 2016-02-01 ENCOUNTER — Other Ambulatory Visit: Payer: Self-pay

## 2016-02-01 LAB — PREGNANCY, URINE: Preg Test, Ur: NEGATIVE

## 2016-02-01 NOTE — Tx Team (Signed)
Interdisciplinary Treatment Plan Update (Adult) Date: 02/01/2016    Time Reviewed: 9:30 AM  Progress in Treatment: Attending groups: Yes Participating in groups: Yes Taking medication as prescribed: Yes Tolerating medication: Yes Family/Significant other contact made: No, CSW assessing for appropriate contacts Patient understands diagnosis: Yes Discussing patient identified problems/goals with staff: Yes Medical problems stabilized or resolved: Yes Denies suicidal/homicidal ideation: Yes Issues/concerns per patient self-inventory: Yes Other:  New problem(s) identified: N/A  Discharge Plan or Barriers: Patient is interested in residential treatment at [facility].  Reason for Continuation of Hospitalization:  Depression Anxiety Medication Stabilization   Comments: N/A  Estimated length of stay: 2-3 days    Patient is a 35 year old female, with a diagnosis of alcohol use disorder and major depressive disorder, on admission. Patient presented to the hospital with depressive symptoms , suicidal ideation and alcohol abuse. Patient reports primary trigger for admission was being depressed and drinking, and an argument with her daughter. Patient will benefit from crisis stabilization, medication evaluation, group therapy and psycho education in addition to case management for discharge planning. At discharge, it is recommended that patient remain compliant with established discharge plan and continued treatment.   Review of initial/current patient goals per problem list:  1. Goal(s): Patient will participate in aftercare plan   Met: Goal progressing   Target date: 3-5 days post admission date   As evidenced by: Patient will participate within aftercare plan AEB aftercare provider and housing plan at discharge being identified.   4/3: Goal progressing. Patient interested in treatment at [facility].   2. Goal (s): Patient will exhibit decreased depressive symptoms and suicidal  ideations.   Met: No   Target date: 3-5 days post admission date   As evidenced by: Patient will utilize self rating of depression at 3 or below and demonstrate decreased signs of depression or be deemed stable for discharge by MD.  4/3: Patient rates depression at 8.   3. Goal(s): Patient will demonstrate decreased signs and symptoms of anxiety.   Met: No   Target date: 3-5 days post admission date   As evidenced by: Patient will utilize self rating of anxiety at 3 or below and demonstrated decreased signs of anxiety, or be deemed stable for discharge by MD  4/3: Patient rates anxiety at 8.    4. Goal(s): Patient will demonstrate decreased signs of withdrawal due to substance abuse   Met: Goal progressing   Target date: 3-5 days post admission date   As evidenced by: Patient will produce a CIWA/COWS score of 0, have stable vitals signs, and no symptoms of withdrawal   4/3: Goal progressing. Patient with CIWA score of 1, experiencing sweating.  Attendees: Patient:    Family:    Physician: Dr. Parke Poisson; Dr. Sabra Heck 02/01/2016 9:30 AM  Nursing: Marcella Dubs, Grayland Ormond, [place], [place], RN 02/01/2016 9:30 AM  Clinical Social Worker: Tilden Fossa, LCSW 02/01/2016 9:30 AM  Other: Peri Maris, LCSWA; [place], LCSW  02/01/2016 9:30 AM  Other: Norberto Sorenson, P4CC 02/01/2016 9:30 AM  Other: Lars Pinks, Case Manager 02/01/2016 9:30 AM  Other:  Ethelle Lyon, NP 02/01/2016 9:30 AM  Other:     Scribe for Treatment Team:  Tilden Fossa, [place]
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
## 2016-02-01 NOTE — Plan of Care (Signed)
Problem: Diagnosis: Increased Risk For Suicide Attempt Goal: STG-Patient Will Attend All Groups On The Unit Outcome: Progressing Pt attended evening group on 01/31/16

## 2016-02-01 NOTE — BHH Group Notes (Signed)
   [facility] LCSW Aftercare Discharge Planning Group Note  02/01/2016  8:45 AM   Participation Quality: Alert, Appropriate and Oriented  Mood/Affect: Depressed and Flat  Depression Rating: 8  Anxiety Rating: 8  Thoughts of Suicide: Pt denies SI/HI  Will you contract for safety? Yes  Current AVH: Pt denies  Plan for Discharge/Comments: Pt attended discharge planning group and actively participated in group. CSW provided pt with today's workbook. Patient is interested in residential treatment at [facility].  Transportation Means: Pt reports access to transportation  Supports: No supports mentioned at this time  Tilden Fossa, MSW, [employer] Clinical Social Worker [employer] [phone]
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
## 2016-02-01 NOTE — Progress Notes (Signed)
Pt attended spiritual care group on grief and loss facilitated by chaplain K[place]ton Duerst   Group opened with brief discussion and psycho-social ed around grief and loss in relationships and in relation to self - identifying life patterns, circumstances, changes that cause losses. Established group norm of speaking from own life experience. Group goal of establishing open and affirming space for members to share loss and experience with grief, normalize grief experience and provide psycho social education and grief support.     

## 2016-02-01 NOTE — Progress Notes (Signed)
Recreation Therapy Notes  Date: 04.03.2017 Time: 9:30am Location: [address] Dayroom   Group Topic: Stress Management  Goal Area(s) Addresses:  Patient will actively participate in stress management techniques presented during session.   Behavioral Response: Did not attend.   Laureen Ochs Annett Boxwell, LRT/CTRS        Krystyna Cleckley L 02/01/2016 3:11 PM

## 2016-02-01 NOTE — Progress Notes (Signed)
Patient ID: Victoria Gordon, female   DOB: 1980-12-29, 35 y.o.   MRN: [id number]   Pt currently presents with a depressed affect and guarded, cooperative behavior. Per self inventory, pt rates depression, hopelessness and anxiety at an 8. Pt's daily goal is to "stay sober" and they intend to do so by "don't know." Pt reports fair sleep, a fair appetite, low energy and good concentration.   Pt provided with medications per providers orders. Pt's labs and vitals were monitored throughout the day. Pt supported emotionally and encouraged to express concerns and questions. Pt educated on medications. EKG completed, no abnormalities noted.   Pt's safety ensured with 15 minute and environmental checks. Pt currently denies SI/HI and A/V hallucinations. Pt verbally agrees to seek staff if SI/HI or A/VH occurs and to consult with staff before acting on these thoughts. Pt reports that she feels better physically but not "emotionally." Will continue POC.

## 2016-02-01 NOTE — Progress Notes (Signed)
D: Pt was in day room upon initial approach.  Pt has depressed affect and mood.  She forwards little information to Probation officer.  Pt reports her goal is "staying sober" and that her day was "okay."  Pt denies SI/HI, denies hallucinations, denies pain.  Pt attended evening group.   A: Introduced self to pt.  Actively listened to pt and offered support and encouragement.  Medications administered per order.   R: Pt is compliant with medications.  Pt verbally contracts for safety.  Will continue to monitor and assess.

## 2016-02-02 LAB — LIPID PANEL
CHOLESTEROL: 173 mg/dL (ref 0–200)
HDL: 39 mg/dL — ABNORMAL LOW (ref >40)
LDL Cholesterol: 103 mg/dL — ABNORMAL HIGH (ref 0–99)
TRIGLYCERIDES: 155 mg/dL — AB (ref <150)
Total CHOL/HDL Ratio: 4.4 RATIO
VLDL: 31 mg/dL (ref 0–40)

## 2016-02-02 NOTE — Progress Notes (Signed)
D: Patient has been in the day room.  She interacts minimally with staff and peers.  She rates her depression as a 6; hopelessness as a 4; anxiety as a 2.  Patient hopes to go to a long term facility in [place].  She denies any withdrawal symptoms.  Her goal is to "get into [facility]."  She denies SI/HI/AVH. A: Continue to monitor medication management and MD orders.  Safety checks completed every 15 minutes per protocol.  Offer support and encouragement as needed. R: Patient is receptive to staff; her behavior is appropriate.

## 2016-02-02 NOTE — Progress Notes (Signed)
Recreation Therapy Notes  Animal-Assisted Activity (AAA) Program Checklist/Progress Notes Patient Eligibility Criteria Checklist & Daily Group note for Rec Tx Intervention  Date: 04.04.2017 Time: 2:45pm Location: [address]    AAA/T Program Assumption of Risk Form signed by Patient/ or Parent Legal Guardian Yes  Patient is free of allergies or sever asthma Yes  Patient reports no fear of animals Yes  Patient reports no history of cruelty to animals Yes  Patient understands his/her participation is voluntary Yes  Behavioral Response: Did not attend.     Laureen Ochs Trenae Brunke, LRT/CTRS        Jerriah Ines L 02/02/2016 3:08 PM

## 2016-02-02 NOTE — BHH Group Notes (Signed)

## 2016-02-02 NOTE — Progress Notes (Signed)
D: Pt was in the day room upon initial approach.  Pt has depressed affect and mood.  She reports her goal was "to see on of the counselors and I did."  Pt reports she may be going to an inpatient rehab facility in [place] after discharge and that she feels good about this.  Pt denies SI/HI, denies hallucinations, denies pain.  Pt has been visible in milieu.  Pt attended evening group.   A: Actively listened to pt and offered support and encouragement.  Medications administered per order.   R: Pt is compliant with medications.  Pt verbally contracts for safety.  Will continue to monitor and assess.

## 2016-02-02 NOTE — Progress Notes (Signed)
Pt had an ok day and rated her day an 8. Pt goal for tomorrow is to check and see if she was accepted into placement upon discharge.

## 2016-02-02 NOTE — Progress Notes (Signed)
D: Pt was in the day room upon initial approach. Pt has depressed affect and mood.  She forwards little information and interacts with peers minimally.  She is cooperative with staff.  Reports her day was "good" and that she did not have a goal today.  Pt and writer made goal to have a good night.   Pt denies SI/HI, denies hallucinations, denies pain.  Pt attended evening group.  A: Actively listened to pt and offered support and encouragement.  Medication administered per order.   R: Pt is compliant with medication.  Pt verbally contracts for safety and reports that she will inform staff of needs and concerns.  Will continue to monitor and assess.

## 2016-02-02 NOTE — Plan of Care (Signed)
Problem: Alteration in mood & ability to function due to Goal: STG-Patient will comply with prescribed medication regimen (Patient will comply with prescribed medication regimen)  Outcome: Progressing Pt has been compliant with medications tonight.
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
## 2016-02-02 NOTE — BHH Group Notes (Signed)
Patient attend group. 

## 2016-02-02 NOTE — BHH Group Notes (Signed)
[place] LCSW Group Therapy 02/02/2016 1:15 PM Type of Therapy: Group Therapy Participation Level: Active  Participation Quality: Attentive, Sharing and Supportive  Affect: Depressed and Flat  Cognitive: Alert and Oriented  Insight: Developing/Improving and Engaged  Engagement in Therapy: Developing/Improving and Engaged  Modes of Intervention: Activity, Clarification, Confrontation, Discussion, Education, Exploration, Limit-setting, Orientation, Problem-solving, Rapport Building, Art therapist, Socialization and Support  Summary of Progress/Problems: Patient was attentive and engaged with speaker from [place]. Patient was attentive to speaker while they shared their story of dealing with mental health and overcoming it. Patient expressed interest in their programs and services and received information on their agency. Patient processed ways they can relate to the speaker.   Tilden Fossa, LCSW Clinical Social Worker [facility] [phone]

## 2016-02-03 LAB — HEMOGLOBIN A1C
HEMOGLOBIN A1C: 5.7 % — AB (ref 4.8–5.6)
MEAN PLASMA GLUCOSE: 117 mg/dL

## 2016-02-03 LAB — PROLACTIN: Prolactin: 171.7 ng/mL — ABNORMAL HIGH (ref 4.8–23.3)

## 2016-02-03 MED ORDER — TRAZODONE HCL 150 MG PO TABS
150.0000 mg | ORAL_TABLET | Freq: Every day | ORAL | Status: DC
  Administered 2016-02-03 – 2016-02-04 (×2): 150 mg via ORAL
  Filled 2016-02-03: qty 7
  Filled 2016-02-03 (×4): qty 1

## 2016-02-03 NOTE — Progress Notes (Signed)
Pt attended NA meeting this evening.  

## 2016-02-03 NOTE — Progress Notes (Signed)
Recreation Therapy Notes  Date: 04.04.02017 Time: 9:30am Location: [address] Group Room   Group Topic: Stress Management  Goal Area(s) Addresses:  Patient will actively participate in stress management techniques presented during session.   Behavioral Response: Appropriate   Intervention: Stress management techniques  Activity : Deep Breathing and Tapping. LRT provided education, instruction and demonstration on practice of Deep Breathing and Tapping. Patient was asked to participate in technique introduced during session.   Education:  Stress Management, Discharge Planning.   Education Outcome: Acknowledges education  Clinical Observations/Feedback: Patient actively engaged in technique introduced, expressed no concerns and demonstrated ability to practice independently post d/c.   Laureen Ochs Brylea Pita, LRT/CTRS        Bryden Darden L 02/03/2016 2:13 PM

## 2016-02-03 NOTE — Plan of Care (Signed)
Problem: Alteration in mood Goal: LTG-Patient reports reduction in suicidal thoughts (Patient reports reduction in suicidal thoughts and is able to verbalize a safety plan for whenever patient is feeling suicidal)  Outcome: Progressing Pt continues to deny SI.  She verbally contracts for safety.

## 2016-02-03 NOTE — Progress Notes (Signed)
D:Patient states she did not sleep well last night.  She denies any SI/HI/AVH.  She has minimal interaction with her peers and staff.  She continues to express her desire to get into [facility].  She is agreeable to go home with her mother until a bed is available.  She rates her depression as a 4; anxiety and hopelessness as a 0.   A: Continue to monitor medication management and MD orders.  Safety checks completed every 15 minutes per protocol.  Offer support and encouragement as needed. R: Patient forwards little; her behavior is appropriate.

## 2016-02-03 NOTE — BHH Group Notes (Signed)
   [facility] LCSW Aftercare Discharge Planning Group Note  02/03/2016  8:45 AM   Participation Quality: Alert, Appropriate and Oriented  Mood/Affect: Depressed and Flat  Depression Rating: 4  Anxiety Rating: Reports low anxiety today  Thoughts of Suicide: Pt denies SI/HI  Will you contract for safety? Yes  Current AVH: Pt denies  Plan for Discharge/Comments: Pt attended discharge planning group and actively participated in group. CSW provided pt with today's workbook. Patient is interested in residential treatment at [facility]- referral pending. She can return home with her mother to await bed availability if needed.  Transportation Means: Pt reports access to transportation  Supports: No supports mentioned at this time  Tilden Fossa, MSW, [employer] Clinical Social Worker [employer] [phone]
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
## 2016-02-04 NOTE — Plan of Care (Signed)
Problem: Ineffective individual coping Goal: STG: Patient will remain free from self harm Outcome: Progressing Pt safe on the unit at this time     

## 2016-02-04 NOTE — Progress Notes (Signed)
[place] Group Notes:  (Nursing/MHT/Case Management/Adjunct)  Date:  02/04/2016  Time:  2100  Type of Therapy:  wrap up group  Participation Level:  Minimal  Participation Quality:  Appropriate, Attentive, Sharing and Supportive  Affect:  Flat  Cognitive:  Appropriate  Insight:  Lacking  Engagement in Group:  Engaged  Modes of Intervention:  Clarification, Education and Support  Summary of Progress/Problems: Pt shares that she is a mother of five kids and has been drinking. Pt reports being discharged home tomorrow and is looking forward to that where she has support from a sister. Pt is awaiting a bed [facility].  Shellia Cleverly 02/04/2016, 10:12 PM

## 2016-02-04 NOTE — BHH Group Notes (Signed)
[facility] Group Notes:  (Nursing/MHT/Case Management/Adjunct)  Date:  02/04/2016  Time:  0900  Type of Therapy:  Nurse Education - Healthy Lifestyle Choices  Participation Level:  Active  Participation Quality:  Attentive  Affect:  Flat  Cognitive:  Alert  Insight:  Limited  Engagement in Group:  Engaged  Modes of Intervention:  Education  Summary of Progress/Problems: Patient attended and participated in group regarding positive lifestyle changes, choices to promote well being and sobriety.  Loletta Specter [facility] 02/04/2016, 0930

## 2016-02-04 NOTE — Progress Notes (Signed)
D: Pt denies SI/HI/AVH. Pt is pleasant and cooperative. Pt stated she was doing "better" , due to her fixing her problems. Pt stated she will refrain from drinking.   A: Pt was offered support and encouragement. Pt was given scheduled medications. Pt was encourage to attend groups. Q 15 minute checks were done for safety.   R:Pt attends groups and interacts well with peers and staff. Pt is taking medication. Pt has no complaints.Pt receptive to treatment and safety maintained on unit.

## 2016-02-04 NOTE — Progress Notes (Signed)
Patient up and visible in milieu though interaction with staff and peers is limited. Affect is flat, mood depressed however she rates her depression, hopelessness and anxiety all at a 0/10. Forwards minimal information. Denies pain or physical complaints. Medicated per orders. Emotional support offered. Self inventory reviewed. Denies SI/HI and remains safe on level III obs.

## 2016-02-04 NOTE — Plan of Care (Signed)
Problem: Ineffective individual coping Goal: STG-Increase in ability to manage activities of daily living Outcome: Progressing Patient up, dressed, hygiene completed.  Problem: Diagnosis: Increased Risk For Suicide Attempt Goal: STG-Patient Will Comply With Medication Regime Outcome: Progressing Patient med compliant.

## 2016-02-04 NOTE — BHH Group Notes (Signed)
Late Entry from 02/03/16:  [facility] LCSW Group Therapy 02/03/2016  1:15 PM Type of Therapy: Group Therapy Participation Level: Minimal  Participation Quality: Attentive  Affect: Depressed and Flat  Cognitive: Alert and Oriented  Insight: Developing/Improving and Engaged  Engagement in Therapy: Developing/Improving and Engaged  Modes of Intervention: Clarification, Confrontation, Discussion, Education, Exploration, Limit-setting, Orientation, Problem-solving, Rapport Building, Art therapist, Socialization and Support  Summary of Progress/Problems: The topic for group today was emotional regulation. This group focused on both positive and negative emotion identification and allowed group members to process ways to identify feelings, regulate negative emotions, and find healthy ways to manage internal/external emotions. Group members were asked to reflect on a time when their reaction to an emotion led to a negative outcome and explored how alternative responses using emotion regulation would have benefited them. Group members were also asked to discuss a time when emotion regulation was utilized when a negative emotion was experienced. Patient did not engage in discussion despite CSW encouragement.   Tilden Fossa, MSW, [place] Clinical Social Worker [facility] [phone]

## 2016-02-04 NOTE — BHH Group Notes (Signed)
[place] LCSW Group Therapy  02/04/2016 3:34 PM  Type of Therapy:  Group Therapy  Participation Level:  None  Participation Quality:  Attentive  Affect:  Depressed and Flat  Cognitive:  Lacking  Insight:  None  Engagement in Therapy:  None  Modes of Intervention:  Confrontation, Discussion, Education, Exploration, Problem-solving, Rapport Building, Socialization and Support  Summary of Progress/Problems: Self Sabotaging Behaviors. Patient was attentive during today's group but did not participate in group discussion. At this time she does not appear to be making progress in the group setting and appears anxious in group. She actively listened as others shared and remained in group for its entirety.   Smart, [place]ira Olkowski LCSW 02/04/2016, 3:34 PM

## 2016-02-05 MED ORDER — TRAZODONE HCL 150 MG PO TABS: 150.0000 mg | ORAL_TABLET | Freq: Every day | ORAL | Status: DC

## 2016-02-05 MED ORDER — LISINOPRIL 10 MG PO TABS: 10.0000 mg | ORAL_TABLET | Freq: Every day | ORAL | Status: DC

## 2016-02-05 MED ORDER — SERTRALINE HCL 100 MG PO TABS
100.0000 mg | ORAL_TABLET | Freq: Every day | ORAL | Status: DC
Start: 2016-02-05 — End: 2021-06-11

## 2016-02-05 NOTE — Progress Notes (Signed)
Victoria Gordon was D/C from the unit to lobby accompanied by family.  She was pleasant and cooperative. She voiced no SI/HI or A/V halluciations.  Pt. Denies any pain or discomfort.  D/C instructions and medications reviewed with pt.  Pt. verbalized understanding of medications and d/c instructions.   All belongings (locker 48-black boots) returned to pt. Q 15 min checks maintained until discharge.  She left the unit in no apparent distress.
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
## 2016-02-05 NOTE — Progress Notes (Signed)
Patient ID: Victoria Gordon, female   DOB: 11-20-1980, 35 y.o.   MRN: [id number] D: Client visible on the unit, in dayroom watching TV. Client reports of her day "good" "doctor told me I going home tomorrow" Client reports goal meet "stop drinking, to AA, see my therapist" "to start Day [place] [place]" A: Writer commended client for moving forward in recovery. Medication reviewed and administered as ordered. Staff will monitor q8mn for safety. R: client is safe on the unit, attended group.

## 2016-02-05 NOTE — Tx Team (Signed)
Interdisciplinary Treatment Plan Update (Adult) Date: 02/05/2016    Time Reviewed: 9:30 AM  Progress in Treatment: Attending groups: Yes Participating in groups: Yes Taking medication as prescribed: Yes Tolerating medication: Yes Family/Significant other contact made: SPE completed with pt; as she declined to consent to family contact.  Patient understands diagnosis: Yes Discussing patient identified problems/goals with staff: Yes Medical problems stabilized or resolved: Yes Denies suicidal/homicidal ideation: Yes Issues/concerns per patient self-inventory: Yes Other:  New problem(s) identified: N/A  Discharge Plan or Barriers: Patient is on [facility] waitlist and has appt for hospital follow-up with Tamela Gammon for Monday at 8am. She plans to return home in the meantime.   Reason for Continuation of Hospitalization:  none  Comments: N/A  Estimated length of stay: d/c today  Patient is a 35 year old female, with a diagnosis of alcohol use disorder and major depressive disorder, on admission. Patient presented to the hospital with depressive symptoms , suicidal ideation and alcohol abuse. Patient reports primary trigger for admission was being depressed and drinking, and an argument with her daughter. Patient will benefit from crisis stabilization, medication evaluation, group therapy and psycho education in addition to case management for discharge planning. At discharge, it is recommended that patient remain compliant with established discharge plan and continued treatment.   Review of initial/current patient goals per problem list:  1. Goal(s): Patient will participate in aftercare plan   Met: Goal met   Target date: 3-5 days post admission date   As evidenced by: Patient will participate within aftercare plan AEB aftercare provider and housing plan at discharge being identified.   4/3: Goal progressing. Patient interested in [facility] at [facility].   4/7: Goal met. Pt  is on [facility] waitlist and has appt for follow-up with [facility].    2. Goal (s): Patient will exhibit decreased depressive symptoms and suicidal ideations.   Met: Yes   Target date: 3-5 days post admission date   As evidenced by: Patient will utilize self rating of depression at 3 or below and demonstrate decreased signs of depression or be deemed stable for discharge by MD.  4/3: Patient rates depression at 8.  4/7: Pt rates depression as 2/10 and presents with pleasant mood/calf affect. Denies SI/HI/AVH.    3. Goal(s): Patient will demonstrate decreased signs and symptoms of anxiety.   Met: Yes   Target date: 3-5 days post admission date   As evidenced by: Patient will utilize self rating of anxiety at 3 or below and demonstrated decreased signs of anxiety, or be deemed stable for discharge by MD  4/3: Patient rates anxiety at 8.  4/7: Pt rates anxiety as 3/10 and presents with pleasant mood/calm affect. She reports that she feels ready to discharge today.     4. Goal(s): Patient will demonstrate decreased signs of withdrawal due to substance abuse   Met: Goal met   Target date: 3-5 days post admission date   As evidenced by: Patient will produce a CIWA/COWS score of 0, have stable vitals signs, and no symptoms of withdrawal   4/3: Goal progressing. Patient with CIWA score of 1, experiencing sweating.  4/7: Pt denies withdrawal symptoms with CIWA score of 0 and stable vitals.   Attendees: Patient:    Family:    Physician: Dr. Sabra Heck 02/05/2016 9:30 AM  Nursing: Ronal Fear RN  02/05/2016 9:30 AM  Clinical Social Worker: Ander Purpura carter LCSWA  02/05/2016 9:30 AM  Other:Saniyyah Elster Smart, LCSW  02/05/2016 9:30 AM  Other:  Norberto Sorenson, [facility] 02/05/2016 9:30 AM  Other: Lars Pinks, Case Manager 02/05/2016 9:30 AM  Other:  May Dot Been [facility] NP  02/05/2016 9:30 AM  Other:     Scribe for Treatment Team:  Maxie Better, MSW, LCSW Clinical Social  Worker 02/05/2016 11:08 AM

## 2016-02-05 NOTE — Progress Notes (Signed)
  [facility] Adult Case Management Discharge Plan :  Will you be returning to the same living situation after discharge:  Yes,  return home At discharge, do you have transportation home?: Yes,  friend Do you have the ability to pay for your medications: Yes,  mental health  Release of information consent forms completed and submitted to medical records by CSW. Patient to Follow up at: Follow-up Information    Follow up with [facility].   Why:  Referral made on 4/4. Please contact [place] or admission coordinator every 2-3 days to inquire about bed availability and referral status.    Contact information:   [place] [phone]      Follow up with [facility] On 02/08/2016.   Why:  Walk-in Monday April 10th between 8am to 10am for hospital follow up appointment. Please arrive early in order to be seen as quickly as possible.   Contact information:   [address], [place] [address] Phone: [phone]      Next level of care provider has access to [place] and Suicide Prevention discussed: Yes,  SPE completed with pt. She declined to consent to family contact. SPI pamphlet and mobile crisis information provided to pt.   Have you used any form of tobacco in the last 30 days? (Cigarettes, Smokeless Tobacco, Cigars, and/or Pipes): No  Has patient been referred to the Quitline?: N/A patient is not a smoker  Patient has been referred for addiction treatment: Yes  Smart, [place] Mittman LCSW 02/05/2016, 11:02 AM
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
## 2016-02-07 ENCOUNTER — Emergency Department (HOSPITAL_COMMUNITY)
Admission: EM | Admit: 2016-02-07 | Discharge: 2016-02-08 | Disposition: A | Discharge status: Home / Self Care | Payer: Medicaid Other | Attending: Emergency Medicine | Admitting: Emergency Medicine

## 2016-02-07 ENCOUNTER — Emergency Department (HOSPITAL_COMMUNITY): Payer: Medicaid Other

## 2016-02-07 ENCOUNTER — Encounter (HOSPITAL_COMMUNITY): Payer: Self-pay | Admitting: *Deleted

## 2016-02-07 DIAGNOSIS — F329 Major depressive disorder, single episode, unspecified: Secondary | ICD-10-CM | POA: Insufficient documentation

## 2016-02-07 DIAGNOSIS — R4182 Altered mental status, unspecified: Secondary | ICD-10-CM | POA: Insufficient documentation

## 2016-02-07 DIAGNOSIS — Z79899 Other long term (current) drug therapy: Secondary | ICD-10-CM | POA: Diagnosis not present

## 2016-02-07 DIAGNOSIS — F1010 Alcohol abuse, uncomplicated: Secondary | ICD-10-CM

## 2016-02-07 HISTORY — DX: Anxiety disorder, unspecified: F41.9

## 2016-02-07 HISTORY — DX: Alcohol abuse, uncomplicated: F10.10

## 2016-02-07 HISTORY — DX: Major depressive disorder, single episode, unspecified: F32.9

## 2016-02-07 HISTORY — DX: Post-traumatic stress disorder, unspecified: F43.10

## 2016-02-07 HISTORY — DX: Other psychoactive substance abuse, uncomplicated: F19.10

## 2016-02-07 LAB — COMPREHENSIVE METABOLIC PANEL
ALBUMIN: 4.4 g/dL (ref 3.5–5.0)
ALK PHOS: 106 U/L (ref 38–126)
ALT: 46 U/L (ref 14–54)
AST: 71 U/L — AB (ref 15–41)
Anion gap: 13 (ref 5–15)
BILIRUBIN TOTAL: 0.4 mg/dL (ref 0.3–1.2)
BUN: 5 mg/dL — AB (ref 6–20)
CO2: 21 mmol/L — ABNORMAL LOW (ref 22–32)
Calcium: 8.2 mg/dL — ABNORMAL LOW (ref 8.9–10.3)
Chloride: 110 mmol/L (ref 101–111)
Creatinine, Ser: 0.65 mg/dL (ref 0.44–1.00)
GFR calc Af Amer: >60 mL/min (ref >60)
GLUCOSE: 161 mg/dL — AB (ref 65–99)
Potassium: 3.1 mmol/L — ABNORMAL LOW (ref 3.5–5.1)
Sodium: 144 mmol/L (ref 135–145)
TOTAL PROTEIN: 8 g/dL (ref 6.5–8.1)

## 2016-02-07 LAB — CBC WITH DIFFERENTIAL/PLATELET
BASOS ABS: 0 10*3/uL (ref 0.0–0.1)
BASOS PCT: 0 %
Eosinophils Absolute: 0 10*3/uL (ref 0.0–0.7)
Eosinophils Relative: 0 %
HEMATOCRIT: 35.5 % — AB (ref 36.0–46.0)
HEMOGLOBIN: 11.5 g/dL — AB (ref 12.0–15.0)
LYMPHS PCT: 42 %
Lymphs Abs: 4.3 10*3/uL — ABNORMAL HIGH (ref 0.7–4.0)
MCH: 28.5 pg (ref 26.0–34.0)
MCHC: 32.4 g/dL (ref 30.0–36.0)
MCV: 87.9 fL (ref 78.0–100.0)
Monocytes Absolute: 1.4 10*3/uL — ABNORMAL HIGH (ref 0.1–1.0)
Monocytes Relative: 14 %
NEUTROS ABS: 4.6 10*3/uL (ref 1.7–7.7)
NEUTROS PCT: 44 %
Platelets: 270 10*3/uL (ref 150–400)
RBC: 4.04 MIL/uL (ref 3.87–5.11)
RDW: 15.2 % (ref 11.5–15.5)
WBC: 10.3 10*3/uL (ref 4.0–10.5)

## 2016-02-07 LAB — I-STAT BETA HCG BLOOD, ED (MC, WL, AP ONLY): I-stat hCG, quantitative: <5 m[IU]/mL (ref <5)

## 2016-02-07 LAB — ETHANOL: ALCOHOL ETHYL (B): 293 mg/dL — AB (ref <5)

## 2016-02-07 MED ORDER — SODIUM CHLORIDE 0.9 % IV BOLUS (SEPSIS)
1000.0000 mL | Freq: Once | INTRAVENOUS | Status: AC
Start: 2016-02-07 — End: 2016-02-08
  Administered 2016-02-07: 1000 mL via INTRAVENOUS

## 2016-02-07 NOTE — ED Provider Notes (Signed)
CSN: [id number]     Arrival date & time 02/07/16  1912 History   First MD Initiated Contact with Patient 02/07/16 1942     Chief Complaint  Patient presents with  . V70.1     (Consider location/radiation/quality/duration/timing/severity/associated sxs/prior Treatment) Patient is a 35 y.o. female presenting with altered mental status. The history is provided by the police (Patient brought in by police could she is acting up at home hitting her head and yelling and screaming. Patient was discharged 2 days ago from behavior health for alcohol abuse).  Altered Mental Status Presenting symptoms: behavior changes   Severity:  Severe Most recent episode:  Today Episode history:  Multiple Timing:  Constant Progression:  Unchanged Chronicity:  Recurrent Context: alcohol use     Past Medical History  Diagnosis Date  . Hemorrhoid   . PTSD (post-traumatic stress disorder)   . Major depressive disorder ([place])   . Alcohol abuse   . Substance abuse   . Anxiety    Past Surgical History  Procedure Laterality Date  . Cesarean section    . Mouth surgery    . Cholecystectomy    . Tubal ligation Bilateral 07/22/2014    Procedure: POST PARTUM TUBAL LIGATION;  Surgeon: Woodroe Mode, MD;  Location: [facility];  Service: Gynecology;  Laterality: Bilateral;   Family History  Problem Relation Age of Onset  . Diabetes Maternal Grandmother   . Diabetes Paternal Grandfather    Social History  Substance Use Topics  . Smoking status: Never Smoker   . Smokeless tobacco: Never Used  . Alcohol Use: No   OB History    Gravida Para Term Preterm AB TAB SAB Ectopic Multiple Living   7 5 5  0 2 0 2 0 0 5     Review of Systems  Unable to perform ROS: Mental status change      Allergies  Review of patient's allergies indicates no known allergies.  Home Medications   Prior to Admission medications   Medication Sig Start Date End Date Taking? Authorizing Provider  lisinopril (PRINIVIL,ZESTRIL) 10  MG tablet Take 1 tablet (10 mg total) by mouth daily. For high blood pressure 02/05/16   Encarnacion Slates, NP  sertraline (ZOLOFT) 100 MG tablet Take 1 tablet (100 mg total) by mouth daily. For depression 02/05/16   Encarnacion Slates, NP  traZODone (DESYREL) 150 MG tablet Take 1 tablet (150 mg total) by mouth at bedtime. For insomnia 02/05/16   Encarnacion Slates, NP   BP 107/50 mmHg  Pulse 109  Temp(Src) 99 F (37.2 C) (Oral)  Resp 18  Ht 5\' 3"  (1.6 m)  Wt 198 lb (89.812 kg)  BMI 35.08 kg/m2  SpO2 95%  LMP  Physical Exam  Constitutional: She appears well-developed.  HENT:  Head: Normocephalic.  Eyes: Conjunctivae and EOM are normal. No scleral icterus.  Neck: Neck supple. No thyromegaly present.  Cardiovascular: Normal rate and regular rhythm.  Exam reveals no gallop and no friction rub.   No murmur heard. Pulmonary/Chest: No stridor. She has no wheezes. She has no rales. She exhibits no tenderness.  Abdominal: She exhibits no distension. There is no tenderness. There is no rebound.  Musculoskeletal: Normal range of motion. She exhibits no edema.  Lymphadenopathy:    She has no cervical adenopathy.  Neurological: She exhibits normal muscle tone. Coordination normal.  Patient lethargic will not answer questions   Skin: No rash noted. No erythema.    ED Course  Procedures (including  critical care time) Labs Review Labs Reviewed  CBC WITH DIFFERENTIAL/PLATELET - Abnormal; Notable for the following:    Hemoglobin 11.5 (*)    HCT 35.5 (*)    Lymphs Abs 4.3 (*)    Monocytes Absolute 1.4 (*)    All other components within normal limits  COMPREHENSIVE METABOLIC PANEL - Abnormal; Notable for the following:    Potassium 3.1 (*)    CO2 21 (*)    Glucose, Bld 161 (*)    BUN 5 (*)    Calcium 8.2 (*)    AST 71 (*)    All other components within normal limits  ETHANOL - Abnormal; Notable for the following:    Alcohol, Ethyl (B) 293 (*)    All other components within normal limits  URINE RAPID  DRUG SCREEN, HOSP PERFORMED  I-STAT BETA HCG BLOOD, ED ([facility], [facility], [facility] ONLY)    Imaging Review Ct Head Wo Contrast  02/07/2016  CLINICAL DATA:  Altered mental status EXAM: CT HEAD WITHOUT CONTRAST TECHNIQUE: Contiguous axial images were obtained from the base of the skull through the vertex without intravenous contrast. COMPARISON:  None. FINDINGS: The bony calvarium is intact. The ventricles are of normal size and configuration. No findings to suggest acute hemorrhage, acute infarction or space-occupying mass lesion are noted. IMPRESSION: No acute intracranial abnormality noted. Electronically Signed   By: Inez Catalina M.D.   On: 02/07/2016 20:48   I have personally reviewed and evaluated these images and lab results as part of my medical decision-making.   EKG Interpretation None      MDM   Final diagnoses:  None    Diagnosis alcohol abuse. When patient sobered up she stated she was not suicidal C is not homicidal she's not having any hallucinations and she wants to go home. Patient was ambulated but was not steady she will be kept in the emergency department until she can walk without falling. [facility] 8 AM and 10 AM    Milton Ferguson, MD 02/07/16 [phone]

## 2016-02-07 NOTE — ED Notes (Signed)
Pt is now speaking stating "I ain't getting in those piece of shit purple scrubs."

## 2016-02-07 NOTE — ED Notes (Signed)
Pt in room. Handcuffed lying on stretcher. Thrashing around will not follow commands or answer questions. Unable to reason with. Deputy at bedside. Pt is under emergency commitment right now, will be seen by EDP and IVC if needed by EDP

## 2016-02-07 NOTE — ED Notes (Signed)
Spoke with MD about patient and IVC paper work. MD states that the patient does not need IVC papers because she is intoxicated.  Will reevaluate her mental status after she sobers up.  Spoke with Alphonzo Severance about patient and her status.  Informed him that IVC papers would not be taken out at this time and unless she was under arrest that the officer needed to remove her handcuffs and he would not need to stay.  Officer at bedside informed, handcuffs removed, officer to leave.  Will call them back if assistance is needed.

## 2016-02-07 NOTE — ED Notes (Signed)
Per [facility] pt called EMS for abdominal pain. EMS states she would not speak with them. When [employer] arrived the pt wouldn't speak to them either. [facility] states the pt began beating her head on the floor. [facility] states that the family states that this pt was just released from [facility] and will not take her medicine. Family reported to [facility] that the pt has been drinking today. Pt denies any SI/HI but is acting strange in triage (counting in spanish, making funny noises, and other incoherent words).

## 2016-02-07 NOTE — Discharge Instructions (Signed)
[facility] between 8am-10am

## 2016-02-07 NOTE — ED Notes (Signed)
Per EDP will reassess for medical and behavorial health needs when patient awakens. IV started and CT Head completed with minimal response from patient. Since patient is not IVC or under arrest sheriffs deputy has uncuffed patient and left at this time. Will call if needed

## 2016-02-08 NOTE — ED Notes (Signed)
Pt taken out to waiting room in wheelchair, pt got up out of wheelchair and walked steady to phone to call for family member to pick up

## 2016-05-24 ENCOUNTER — Encounter (HOSPITAL_COMMUNITY): Payer: Self-pay | Admitting: *Deleted

## 2016-05-24 ENCOUNTER — Emergency Department (HOSPITAL_COMMUNITY): Payer: Self-pay

## 2016-05-24 ENCOUNTER — Emergency Department (HOSPITAL_COMMUNITY)
Admission: EM | Admit: 2016-05-24 | Discharge: 2016-05-24 | Disposition: A | Discharge status: Home / Self Care | Payer: Self-pay | Attending: Emergency Medicine | Admitting: Emergency Medicine

## 2016-05-24 DIAGNOSIS — N839 Noninflammatory disorder of ovary, fallopian tube and broad ligament, unspecified: Secondary | ICD-10-CM | POA: Insufficient documentation

## 2016-05-24 DIAGNOSIS — E876 Hypokalemia: Secondary | ICD-10-CM

## 2016-05-24 DIAGNOSIS — F329 Major depressive disorder, single episode, unspecified: Secondary | ICD-10-CM | POA: Insufficient documentation

## 2016-05-24 DIAGNOSIS — N838 Other noninflammatory disorders of ovary, fallopian tube and broad ligament: Secondary | ICD-10-CM

## 2016-05-24 DIAGNOSIS — R1084 Generalized abdominal pain: Secondary | ICD-10-CM

## 2016-05-24 DIAGNOSIS — K625 Hemorrhage of anus and rectum: Secondary | ICD-10-CM

## 2016-05-24 LAB — COMPREHENSIVE METABOLIC PANEL
ALBUMIN: 3.6 g/dL (ref 3.5–5.0)
ALT: 86 U/L — ABNORMAL HIGH (ref 14–54)
ANION GAP: 5 (ref 5–15)
AST: 289 U/L — ABNORMAL HIGH (ref 15–41)
Alkaline Phosphatase: 130 U/L — ABNORMAL HIGH (ref 38–126)
BUN: <5 mg/dL — ABNORMAL LOW (ref 6–20)
CHLORIDE: 107 mmol/L (ref 101–111)
CO2: 27 mmol/L (ref 22–32)
Calcium: 7.8 mg/dL — ABNORMAL LOW (ref 8.9–10.3)
Creatinine, Ser: 0.47 mg/dL (ref 0.44–1.00)
GFR calc non Af Amer: >60 mL/min (ref >60)
GLUCOSE: 133 mg/dL — AB (ref 65–99)
POTASSIUM: 3 mmol/L — AB (ref 3.5–5.1)
SODIUM: 139 mmol/L (ref 135–145)
Total Bilirubin: 0.4 mg/dL (ref 0.3–1.2)
Total Protein: 7.1 g/dL (ref 6.5–8.1)

## 2016-05-24 LAB — CBC WITH DIFFERENTIAL/PLATELET
BASOS ABS: 0 10*3/uL (ref 0.0–0.1)
Basophils Relative: 0 %
Eosinophils Absolute: 0 10*3/uL (ref 0.0–0.7)
Eosinophils Relative: 0 %
HEMATOCRIT: 29.2 % — AB (ref 36.0–46.0)
Hemoglobin: 9.4 g/dL — ABNORMAL LOW (ref 12.0–15.0)
LYMPHS PCT: 46 %
Lymphs Abs: 3.2 10*3/uL (ref 0.7–4.0)
MCH: 26.8 pg (ref 26.0–34.0)
MCHC: 32.2 g/dL (ref 30.0–36.0)
MCV: 83.2 fL (ref 78.0–100.0)
MONO ABS: 0.6 10*3/uL (ref 0.1–1.0)
MONOS PCT: 9 %
NEUTROS ABS: 3.1 10*3/uL (ref 1.7–7.7)
NEUTROS PCT: 45 %
Platelets: 237 10*3/uL (ref 150–400)
RBC: 3.51 MIL/uL — ABNORMAL LOW (ref 3.87–5.11)
RDW: 19.4 % — AB (ref 11.5–15.5)
WBC: 6.9 10*3/uL (ref 4.0–10.5)

## 2016-05-24 LAB — POC URINE PREG, ED: Preg Test, Ur: NEGATIVE

## 2016-05-24 LAB — LIPASE, BLOOD: LIPASE: 19 U/L (ref 11–51)

## 2016-05-24 LAB — POC OCCULT BLOOD, ED: Fecal Occult Bld: POSITIVE — AB

## 2016-05-24 MED ORDER — HYDROCORTISONE ACETATE 25 MG RE SUPP: 25.0000 mg | Freq: Two times a day (BID) | RECTAL | 0 refills | Status: DC

## 2016-05-24 MED ORDER — DIATRIZOATE MEGLUMINE & SODIUM 66-10 % PO SOLN
ORAL | Status: AC
  Administered 2016-05-24: 07:00:00
  Filled 2016-05-24: qty 30

## 2016-05-24 MED ORDER — SODIUM CHLORIDE 0.9 % IV BOLUS (SEPSIS)
1000.0000 mL | Freq: Once | INTRAVENOUS | Status: AC
  Administered 2016-05-24: 1000 mL via INTRAVENOUS

## 2016-05-24 MED ORDER — FERROUS SULFATE 325 (65 FE) MG PO TABS: 325.0000 mg | ORAL_TABLET | Freq: Three times a day (TID) | ORAL | 0 refills | Status: DC

## 2016-05-24 MED ORDER — FENTANYL CITRATE (PF) 100 MCG/2ML IJ SOLN
50.0000 ug | Freq: Once | INTRAMUSCULAR | Status: AC
  Administered 2016-05-24: 50 ug via INTRAVENOUS
  Filled 2016-05-24: qty 2

## 2016-05-24 MED ORDER — IOPAMIDOL (ISOVUE-300) INJECTION 61%
100.0000 mL | Freq: Once | INTRAVENOUS | Status: AC | PRN
  Administered 2016-05-24: 100 mL via INTRAVENOUS

## 2016-05-24 MED ORDER — ONDANSETRON HCL 4 MG/2ML IJ SOLN
4.0000 mg | Freq: Once | INTRAMUSCULAR | Status: AC
  Administered 2016-05-24: 4 mg via INTRAVENOUS
  Filled 2016-05-24: qty 2

## 2016-05-24 MED ORDER — POTASSIUM CHLORIDE CRYS ER 20 MEQ PO TBCR: 20.0000 meq | EXTENDED_RELEASE_TABLET | Freq: Two times a day (BID) | ORAL | 0 refills | Status: DC

## 2016-05-24 NOTE — ED Provider Notes (Signed)
[facility] DEPT Provider Note   CSN: [id number] Arrival date & time: 05/24/16  0006  First Provider Contact:  02:32 AM      History   Chief Complaint Chief Complaint  Patient presents with  . Abdominal Pain    HPI Victoria Gordon is a 35 y.o. female.  HPI patient states yesterday morning, July 24 she started having diffuse abdominal pain that she describes as sharp. She states it comes and goes and lasts about 2 minutes at a time. She denies nausea, vomiting, or diarrhea. She denies being constipated. She states she has had about 6 episodes of just having rectal bleeding today. She denies feeling weak or dizzy. She states she's been having the bleeding off and on for the past year and it occurs about every couple of months. She states however before she had no pain with the episodes. She has not sought medical care. She states nothing she does makes the pain worse, nothing she does makes the pain feel better. She denies any change in her activity or her food that will affect the pain.  PCP none  Past Medical History:  Diagnosis Date  . Alcohol abuse   . Anxiety   . Hemorrhoid   . Major depressive disorder ([place])   . PTSD (post-traumatic stress disorder)   . Substance abuse     Patient Active Problem List   Diagnosis Date Noted  . Alcohol use disorder, severe, dependence ([place]) 01/30/2016  . PTSD (post-traumatic stress disorder) 01/30/2016  . MDD (major depressive disorder), recurrent severe, without psychosis ([place]) 01/30/2016  . Hypokalemia 01/30/2016  . Rubella non-immune status, antepartum 02/18/2014  . UTI (urinary tract infection) in pregnancy in second trimester 02/17/2014    Past Surgical History:  Procedure Laterality Date  . CESAREAN SECTION    . CHOLECYSTECTOMY    . MOUTH SURGERY    . TUBAL LIGATION Bilateral 07/22/2014   Procedure: POST PARTUM TUBAL LIGATION;  Surgeon: Woodroe Mode, MD;  Location: [facility];  Service: Gynecology;  Laterality:  Bilateral;    OB History    Gravida Para Term Preterm AB Living   7 5 5  0 2 5   SAB TAB Ectopic Multiple Live Births   2 0 0 0         Home Medications    Prior to Admission medications   Medication Sig Start Date End Date Taking? Authorizing Provider  ferrous sulfate 325 (65 FE) MG tablet Take 1 tablet (325 mg total) by mouth 3 (three) times daily with meals. 05/24/16   Rolland Porter, MD  hydrocortisone (ANUCORT-HC) 25 MG suppository Place 1 suppository (25 mg total) rectally 2 (two) times daily. 05/24/16   Rolland Porter, MD  potassium chloride SA (K-DUR,KLOR-CON) 20 MEQ tablet Take 1 tablet (20 mEq total) by mouth 2 (two) times daily. 05/24/16   Rolland Porter, MD  sertraline (ZOLOFT) 100 MG tablet Take 1 tablet (100 mg total) by mouth daily. For depression 02/05/16   Encarnacion Slates, NP  traZODone (DESYREL) 150 MG tablet Take 1 tablet (150 mg total) by mouth at bedtime. For insomnia 02/05/16   Encarnacion Slates, NP    Family History Family History  Problem Relation Age of Onset  . Diabetes Maternal Grandmother   . Diabetes Paternal Grandfather     Social History Social History  Substance Use Topics  . Smoking status: Never Smoker  . Smokeless tobacco: Never Used  . Alcohol use No  employed Drinks every 2 weeks 3-4  mixed drinks   Allergies   Review of patient's allergies indicates no known allergies.   Review of Systems Review of Systems  All other systems reviewed and are negative.    Physical Exam Updated Vital Signs BP 121/89   Pulse 97   Temp 97.7 F (36.5 C) (Oral)   Resp 20   Ht 5\' 1"  (1.549 m)   Wt 198 lb (89.8 kg)   LMP 04/30/2016   SpO2 97%   BMI 37.41 kg/m   Vital signs normal    Physical Exam  Constitutional: She is oriented to person, place, and time. She appears well-developed and well-nourished.  Non-toxic appearance. She does not appear ill. She appears distressed.  Appears painful  HENT:  Head: Normocephalic and atraumatic.  Right Ear: External ear  normal.  Left Ear: External ear normal.  Nose: Nose normal. No mucosal edema or rhinorrhea.  Mouth/Throat: Oropharynx is clear and moist and mucous membranes are normal. No dental abscesses or uvula swelling.  Eyes: Conjunctivae and EOM are normal. Pupils are equal, round, and reactive to light.  Neck: Normal range of motion and full passive range of motion without pain. Neck supple.  Cardiovascular: Normal rate, regular rhythm and normal heart sounds.  Exam reveals no gallop and no friction rub.   No murmur heard. Pulmonary/Chest: Effort normal and breath sounds normal. No respiratory distress. She has no wheezes. She has no rhonchi. She has no rales. She exhibits no tenderness and no crepitus.  Abdominal: Soft. Normal appearance and bowel sounds are normal. She exhibits no distension. There is tenderness in the right lower quadrant, suprapubic area and left lower quadrant. There is no rebound and no guarding.    She has diffuse pain in her lower abdomen  Musculoskeletal: Normal range of motion. She exhibits no edema or tenderness.  Moves all extremities well.   Neurological: She is alert and oriented to person, place, and time. She has normal strength. No cranial nerve deficit.  Skin: Skin is warm, dry and intact. Capillary refill takes less than 2 seconds. No rash noted. No erythema. No pallor.  Psychiatric: She has a normal mood and affect. Her speech is normal and behavior is normal. Thought content normal. Her mood appears not anxious.  Nursing note and vitals reviewed.    ED Treatments / Results   Labs (all labs ordered are listed, but only abnormal results are displayed) Results for orders placed or performed during the hospital encounter of 05/24/16  Comprehensive metabolic panel  Result Value Ref Range   Sodium 139 135 - 145 mmol/L   Potassium 3.0 (L) 3.5 - 5.1 mmol/L   Chloride 107 101 - 111 mmol/L   CO2 27 22 - 32 mmol/L   Glucose, Bld 133 (H) 65 - 99 mg/dL   BUN <5 (L)  6 - 20 mg/dL   Creatinine, Ser 0.47 0.44 - 1.00 mg/dL   Calcium 7.8 (L) 8.9 - 10.3 mg/dL   Total Protein 7.1 6.5 - 8.1 g/dL   Albumin 3.6 3.5 - 5.0 g/dL   AST 289 (H) 15 - 41 U/L   ALT 86 (H) 14 - 54 U/L   Alkaline Phosphatase 130 (H) 38 - 126 U/L   Total Bilirubin 0.4 0.3 - 1.2 mg/dL   GFR calc non Af Amer >60 >60 mL/min   GFR calc Af Amer >60 >60 mL/min   Anion gap 5 5 - 15  CBC with Differential  Result Value Ref Range   WBC 6.9 4.0 -  10.5 K/uL   RBC 3.51 (L) 3.87 - 5.11 MIL/uL   Hemoglobin 9.4 (L) 12.0 - 15.0 g/dL   HCT 29.2 (L) 36.0 - 46.0 %   MCV 83.2 78.0 - 100.0 fL   MCH 26.8 26.0 - 34.0 pg   MCHC 32.2 30.0 - 36.0 g/dL   RDW 19.4 (H) 11.5 - 15.5 %   Platelets 237 150 - 400 K/uL   Neutrophils Relative % 45 %   Neutro Abs 3.1 1.7 - 7.7 K/uL   Lymphocytes Relative 46 %   Lymphs Abs 3.2 0.7 - 4.0 K/uL   Monocytes Relative 9 %   Monocytes Absolute 0.6 0.1 - 1.0 K/uL   Eosinophils Relative 0 %   Eosinophils Absolute 0.0 0.0 - 0.7 K/uL   Basophils Relative 0 %   Basophils Absolute 0.0 0.0 - 0.1 K/uL  Lipase, blood  Result Value Ref Range   Lipase 19 11 - 51 U/L  POC occult blood, ED RN will collect  Result Value Ref Range   Fecal Occult Bld POSITIVE (A) NEGATIVE  POC Urine Pregnancy, ED (do NOT order at [facility])  Result Value Ref Range   Preg Test, Ur NEGATIVE NEGATIVE   Laboratory interpretation all normal except Positive stool for blood, anemia since April has changed from 11.5 to about 9.5 although she has been in the nines a couple years ago, persistent elevation of LFTs however patient has a history of drinking alcohol, hypokalemia    EKG  EKG Interpretation None       Radiology Ct Abdomen Pelvis W Contrast  Result Date: 05/24/2016 CLINICAL DATA:  35 year old female with lower abdominal pain and rectal bleeding. EXAM: CT ABDOMEN AND PELVIS WITH CONTRAST TECHNIQUE: Multidetector CT imaging of the abdomen and pelvis was performed using the standard protocol  following bolus administration of intravenous contrast. CONTRAST:  138mL ISOVUE-300 IOPAMIDOL (ISOVUE-300) INJECTION 61% COMPARISON:  None. FINDINGS: The visualized lung bases are clear. No intra-abdominal free air or free fluid. Hepatomegaly with diffuse fatty infiltration of the liver. Cholecystectomy. The pancreas, spleen, adrenal glands, kidneys, visualized ureters, and urinary bladder appear unremarkable. The uterus is anteverted. Small uterine fibroids may be present. There is a 3.2 x 2.8 cm teratoma in the left posterior pelvis. Ultrasound may provide better evaluation of the pelvic structures. IMPRESSION: Hepatomegaly with fatty infiltration of the liver. Left adnexal teratoma.  Ultrasound may provide better evaluation. No evidence of bowel obstruction or active inflammation. Normal appendix. Electronically Signed   By: Anner Crete M.D.   On: 05/24/2016 05:11   Procedures Procedures (including critical care time)  Medications  sodium chloride 0.9 % bolus 1,000 mL (0 mLs Intravenous Stopped 05/24/16 0630)  sodium chloride 0.9 % bolus 1,000 mL (0 mLs Intravenous Stopped 05/24/16 0359)  fentaNYL (SUBLIMAZE) injection 50 mcg (50 mcg Intravenous Given 05/24/16 0254)  ondansetron (ZOFRAN) injection 4 mg (4 mg Intravenous Given 05/24/16 0254)  diatrizoate meglumine-sodium (GASTROGRAFIN) 66-10 % solution (  Given by Other 05/24/16 0630)  iopamidol (ISOVUE-300) 61 % injection 100 mL (100 mLs Intravenous Contrast Given 05/24/16 0451)      Initial Impression / Assessment and Plan / ED Course  I have reviewed the triage vital signs and the nursing notes.  Pertinent labs & imaging results that were available during my care of the patient were reviewed by me and considered in my medical decision making (see chart for details).  Clinical Course   Patient was started on IV fluids and she was given IV fentanyl for  pain and Zofran for nausea. Due to the rectal bleeding in her abdominal pain CT scan was  ordered of her abdomen/pelvis.  Recheck at time of discharge, patient is feeling better. We discussed her test results. She was unaware of the growth on her left ovary. She was discharged home with Anusol suppositories and iron tablets. She was referred to gastroenterology, Dr. Oneida Alar and [facility] to further evaluate her ovarian tumor. She was also discharged home on potassium tablets.   Final Clinical Impressions(s) / ED Diagnoses   Final diagnoses:  Rectal bleeding  Generalized abdominal pain  Hypokalemia  Ovarian mass, left    New Prescriptions New Prescriptions   FERROUS SULFATE 325 (65 FE) MG TABLET    Take 1 tablet (325 mg total) by mouth 3 (three) times daily with meals.   HYDROCORTISONE (ANUCORT-HC) 25 MG SUPPOSITORY    Place 1 suppository (25 mg total) rectally 2 (two) times daily.   POTASSIUM CHLORIDE SA (K-DUR,KLOR-CON) 20 MEQ TABLET    Take 1 tablet (20 mEq total) by mouth 2 (two) times daily.    Plan discharge  Rolland Porter, MD, Barbette Or, MD 05/24/16 [phone]

## 2016-05-24 NOTE — Discharge Instructions (Signed)
Drink plenty of fluids. Avoid milk until the diarrhea is gone. You can take Imodium over-the-counter for diarrhea. Use the suppositories twice a day for the rectal bleeding. Take the iron pills which will make your bowel movements black. Please call Dr. Oneida Alar office to get an appointment to be evaluated further for the bleeding. She is a Doctor, hospital or gastroenterologist.  Return to the emergency department if you get worse again. Take the potassium pills until gone. Please call [facility] to get an appointment to follow up on the growth on your left ovary.

## 2016-05-24 NOTE — ED Triage Notes (Signed)
Pt brought in by [facility] for c/o abdominal pain and bleeding from rectum that started x 2 days ago; pt denies any vomiting

## 2016-05-26 ENCOUNTER — Encounter: Payer: Self-pay | Admitting: Obstetrics & Gynecology

## 2016-05-26 ENCOUNTER — Ambulatory Visit: Payer: Medicaid Other | Admitting: Obstetrics & Gynecology

## 2016-05-31 ENCOUNTER — Ambulatory Visit: Payer: Medicaid Other | Admitting: Obstetrics & Gynecology

## 2016-09-03 ENCOUNTER — Inpatient Hospital Stay (HOSPITAL_COMMUNITY)
Admission: EM | Admit: 2016-09-03 | Discharge: 2016-09-05 | DRG: 871 | Disposition: A | Discharge status: Home / Self Care | Payer: Self-pay | Attending: Internal Medicine | Admitting: Internal Medicine

## 2016-09-03 ENCOUNTER — Emergency Department (HOSPITAL_COMMUNITY): Payer: Self-pay

## 2016-09-03 ENCOUNTER — Encounter (HOSPITAL_COMMUNITY): Payer: Self-pay

## 2016-09-03 DIAGNOSIS — R6521 Severe sepsis with septic shock: Secondary | ICD-10-CM | POA: Diagnosis present

## 2016-09-03 DIAGNOSIS — K760 Fatty (change of) liver, not elsewhere classified: Secondary | ICD-10-CM

## 2016-09-03 DIAGNOSIS — A4151 Sepsis due to Escherichia coli [E. coli]: Principal | ICD-10-CM | POA: Diagnosis present

## 2016-09-03 DIAGNOSIS — Z6837 Body mass index (BMI) 37.0-37.9, adult: Secondary | ICD-10-CM

## 2016-09-03 DIAGNOSIS — R509 Fever, unspecified: Secondary | ICD-10-CM

## 2016-09-03 DIAGNOSIS — N136 Pyonephrosis: Secondary | ICD-10-CM | POA: Diagnosis present

## 2016-09-03 DIAGNOSIS — A419 Sepsis, unspecified organism: Secondary | ICD-10-CM

## 2016-09-03 DIAGNOSIS — E669 Obesity, unspecified: Secondary | ICD-10-CM | POA: Diagnosis present

## 2016-09-03 DIAGNOSIS — R748 Abnormal levels of other serum enzymes: Secondary | ICD-10-CM

## 2016-09-03 DIAGNOSIS — R7989 Other specified abnormal findings of blood chemistry: Secondary | ICD-10-CM

## 2016-09-03 DIAGNOSIS — F4310 Post-traumatic stress disorder, unspecified: Secondary | ICD-10-CM | POA: Diagnosis present

## 2016-09-03 DIAGNOSIS — E872 Acidosis: Secondary | ICD-10-CM | POA: Diagnosis present

## 2016-09-03 DIAGNOSIS — Z9049 Acquired absence of other specified parts of digestive tract: Secondary | ICD-10-CM

## 2016-09-03 DIAGNOSIS — R000 Tachycardia, unspecified: Secondary | ICD-10-CM | POA: Diagnosis present

## 2016-09-03 DIAGNOSIS — D649 Anemia, unspecified: Secondary | ICD-10-CM

## 2016-09-03 DIAGNOSIS — N10 Acute tubulo-interstitial nephritis: Secondary | ICD-10-CM

## 2016-09-03 DIAGNOSIS — K449 Diaphragmatic hernia without obstruction or gangrene: Secondary | ICD-10-CM

## 2016-09-03 DIAGNOSIS — R945 Abnormal results of liver function studies: Secondary | ICD-10-CM

## 2016-09-03 DIAGNOSIS — R6520 Severe sepsis without septic shock: Secondary | ICD-10-CM

## 2016-09-03 DIAGNOSIS — E876 Hypokalemia: Secondary | ICD-10-CM | POA: Diagnosis present

## 2016-09-03 DIAGNOSIS — F329 Major depressive disorder, single episode, unspecified: Secondary | ICD-10-CM | POA: Diagnosis present

## 2016-09-03 DIAGNOSIS — Z79899 Other long term (current) drug therapy: Secondary | ICD-10-CM

## 2016-09-03 DIAGNOSIS — D72828 Other elevated white blood cell count: Secondary | ICD-10-CM

## 2016-09-03 DIAGNOSIS — R1010 Upper abdominal pain, unspecified: Secondary | ICD-10-CM

## 2016-09-03 DIAGNOSIS — N83201 Unspecified ovarian cyst, right side: Secondary | ICD-10-CM

## 2016-09-03 DIAGNOSIS — B9620 Unspecified Escherichia coli [E. coli] as the cause of diseases classified elsewhere: Secondary | ICD-10-CM

## 2016-09-03 DIAGNOSIS — D729 Disorder of white blood cells, unspecified: Secondary | ICD-10-CM

## 2016-09-03 DIAGNOSIS — N12 Tubulo-interstitial nephritis, not specified as acute or chronic: Secondary | ICD-10-CM

## 2016-09-03 DIAGNOSIS — D271 Benign neoplasm of left ovary: Secondary | ICD-10-CM

## 2016-09-03 DIAGNOSIS — Z833 Family history of diabetes mellitus: Secondary | ICD-10-CM

## 2016-09-03 DIAGNOSIS — R112 Nausea with vomiting, unspecified: Secondary | ICD-10-CM

## 2016-09-03 LAB — URINE MICROSCOPIC-ADD ON

## 2016-09-03 LAB — I-STAT CG4 LACTIC ACID, ED
Lactic Acid, Venous: 3.14 mmol/L (ref 0.5–1.9)
Lactic Acid, Venous: 3.53 mmol/L (ref 0.5–1.9)

## 2016-09-03 LAB — URINALYSIS, ROUTINE W REFLEX MICROSCOPIC
BILIRUBIN URINE: NEGATIVE
Glucose, UA: NEGATIVE mg/dL
KETONES UR: NEGATIVE mg/dL
Nitrite: POSITIVE — AB
PH: 5.5 (ref 5.0–8.0)
Protein, ur: 100 mg/dL — AB
SPECIFIC GRAVITY, URINE: 1.012 (ref 1.005–1.030)

## 2016-09-03 LAB — COMPREHENSIVE METABOLIC PANEL
ALBUMIN: 3.1 g/dL — AB (ref 3.5–5.0)
ALK PHOS: 246 U/L — AB (ref 38–126)
ALT: 28 U/L (ref 14–54)
ANION GAP: 9 (ref 5–15)
AST: 162 U/L — AB (ref 15–41)
BILIRUBIN TOTAL: 1.5 mg/dL — AB (ref 0.3–1.2)
CALCIUM: 8.7 mg/dL — AB (ref 8.9–10.3)
CO2: 23 mmol/L (ref 22–32)
Chloride: 104 mmol/L (ref 101–111)
Creatinine, Ser: 0.61 mg/dL (ref 0.44–1.00)
GFR calc Af Amer: >60 mL/min (ref >60)
GFR calc non Af Amer: >60 mL/min (ref >60)
GLUCOSE: 125 mg/dL — AB (ref 65–99)
POTASSIUM: 4 mmol/L (ref 3.5–5.1)
SODIUM: 136 mmol/L (ref 135–145)
Total Protein: 7.4 g/dL (ref 6.5–8.1)

## 2016-09-03 LAB — CBC
HEMATOCRIT: 28.4 % — AB (ref 36.0–46.0)
HEMOGLOBIN: 8.4 g/dL — AB (ref 12.0–15.0)
MCH: 25.3 pg — AB (ref 26.0–34.0)
MCHC: 29.6 g/dL — AB (ref 30.0–36.0)
MCV: 85.5 fL (ref 78.0–100.0)
Platelets: 262 10*3/uL (ref 150–400)
RBC: 3.32 MIL/uL — ABNORMAL LOW (ref 3.87–5.11)
RDW: 20.2 % — AB (ref 11.5–15.5)
WBC: 14 10*3/uL — ABNORMAL HIGH (ref 4.0–10.5)

## 2016-09-03 LAB — DIFFERENTIAL
BASOS ABS: 0 10*3/uL (ref 0.0–0.1)
Basophils Relative: 0 %
Eosinophils Absolute: 0.1 10*3/uL (ref 0.0–0.7)
Eosinophils Relative: 1 %
LYMPHS ABS: 1.5 10*3/uL (ref 0.7–4.0)
LYMPHS PCT: 11 %
Monocytes Absolute: 1 10*3/uL (ref 0.1–1.0)
Monocytes Relative: 7 %
NEUTROS ABS: 11.4 10*3/uL — AB (ref 1.7–7.7)
NEUTROS PCT: 81 %

## 2016-09-03 LAB — LIPASE, BLOOD: Lipase: 20 U/L (ref 11–51)

## 2016-09-03 LAB — I-STAT BETA HCG BLOOD, ED (MC, WL, AP ONLY): I-stat hCG, quantitative: <5 m[IU]/mL (ref <5)

## 2016-09-03 LAB — LACTIC ACID, PLASMA: LACTIC ACID, VENOUS: 4.2 mmol/L — AB (ref 0.5–1.9)

## 2016-09-03 MED ORDER — SENNOSIDES-DOCUSATE SODIUM 8.6-50 MG PO TABS: 1.0000 | ORAL_TABLET | Freq: Every evening | ORAL | Status: DC | PRN

## 2016-09-03 MED ORDER — ACETAMINOPHEN 325 MG PO TABS
650.0000 mg | ORAL_TABLET | Freq: Four times a day (QID) | ORAL | Status: DC | PRN
  Administered 2016-09-04 – 2016-09-05 (×2): 650 mg via ORAL
  Filled 2016-09-03 (×2): qty 2

## 2016-09-03 MED ORDER — SODIUM CHLORIDE 0.9 % IV SOLN
1000.0000 mL | INTRAVENOUS | Status: DC
  Administered 2016-09-03 – 2016-09-05 (×4): 1000 mL via INTRAVENOUS

## 2016-09-03 MED ORDER — MORPHINE SULFATE (PF) 4 MG/ML IV SOLN
4.0000 mg | Freq: Once | INTRAVENOUS | Status: AC
  Administered 2016-09-03: 4 mg via INTRAVENOUS
  Filled 2016-09-03: qty 1

## 2016-09-03 MED ORDER — ONDANSETRON HCL 4 MG/2ML IJ SOLN
4.0000 mg | Freq: Four times a day (QID) | INTRAMUSCULAR | Status: DC | PRN
  Administered 2016-09-03: 4 mg via INTRAVENOUS
  Filled 2016-09-03: qty 2

## 2016-09-03 MED ORDER — SODIUM CHLORIDE 0.9 % IV SOLN: INTRAVENOUS | Status: DC

## 2016-09-03 MED ORDER — DEXTROSE 5 % IV SOLN
2.0000 g | Freq: Once | INTRAVENOUS | Status: AC
  Administered 2016-09-03: 2 g via INTRAVENOUS
  Filled 2016-09-03: qty 2

## 2016-09-03 MED ORDER — SODIUM CHLORIDE 0.9 % IV BOLUS (SEPSIS)
1000.0000 mL | Freq: Once | INTRAVENOUS | Status: AC
  Administered 2016-09-03: 1000 mL via INTRAVENOUS

## 2016-09-03 MED ORDER — FERROUS SULFATE 325 (65 FE) MG PO TABS: 325.0000 mg | ORAL_TABLET | Freq: Every day | ORAL | 0 refills | Status: DC

## 2016-09-03 MED ORDER — IBUPROFEN 800 MG PO TABS
800.0000 mg | ORAL_TABLET | Freq: Once | ORAL | Status: AC
  Administered 2016-09-03: 800 mg via ORAL
  Filled 2016-09-03: qty 1

## 2016-09-03 MED ORDER — SODIUM CHLORIDE 0.9% FLUSH
3.0000 mL | Freq: Two times a day (BID) | INTRAVENOUS | Status: DC
  Administered 2016-09-04: 3 mL via INTRAVENOUS

## 2016-09-03 MED ORDER — ENOXAPARIN SODIUM 40 MG/0.4ML ~~LOC~~ SOLN
40.0000 mg | SUBCUTANEOUS | Status: DC
  Administered 2016-09-03 – 2016-09-04 (×2): 40 mg via SUBCUTANEOUS
  Filled 2016-09-03 (×2): qty 0.4

## 2016-09-03 MED ORDER — ONDANSETRON HCL 4 MG/2ML IJ SOLN
4.0000 mg | Freq: Once | INTRAMUSCULAR | Status: AC
  Administered 2016-09-03: 4 mg via INTRAVENOUS
  Filled 2016-09-03: qty 2

## 2016-09-03 MED ORDER — SERTRALINE HCL 100 MG PO TABS
100.0000 mg | ORAL_TABLET | Freq: Every day | ORAL | Status: DC
  Administered 2016-09-03 – 2016-09-05 (×3): 100 mg via ORAL
  Filled 2016-09-03 (×3): qty 1

## 2016-09-03 MED ORDER — DEXTROSE 5 % IV SOLN: 1.0000 g | INTRAVENOUS | Status: DC

## 2016-09-03 MED ORDER — ONDANSETRON HCL 8 MG PO TABS: 8.0000 mg | ORAL_TABLET | Freq: Three times a day (TID) | ORAL | 0 refills | Status: DC | PRN

## 2016-09-03 MED ORDER — ACETAMINOPHEN 500 MG PO TABS
1000.0000 mg | ORAL_TABLET | Freq: Once | ORAL | Status: AC
  Administered 2016-09-03: 1000 mg via ORAL
  Filled 2016-09-03: qty 2

## 2016-09-03 MED ORDER — ACETAMINOPHEN 650 MG RE SUPP: 650.0000 mg | Freq: Four times a day (QID) | RECTAL | Status: DC | PRN

## 2016-09-03 MED ORDER — SODIUM CHLORIDE 0.9 % IV BOLUS (SEPSIS): 1000.0000 mL | Freq: Once | INTRAVENOUS | Status: DC

## 2016-09-03 MED ORDER — HYDROCODONE-ACETAMINOPHEN 5-325 MG PO TABS: 1.0000 | ORAL_TABLET | Freq: Four times a day (QID) | ORAL | 0 refills | Status: DC | PRN

## 2016-09-03 MED ORDER — SULFAMETHOXAZOLE-TRIMETHOPRIM 800-160 MG PO TABS: 1.0000 | ORAL_TABLET | Freq: Two times a day (BID) | ORAL | 0 refills | Status: DC

## 2016-09-03 MED ORDER — NAPROXEN 500 MG PO TABS: 500.0000 mg | ORAL_TABLET | Freq: Two times a day (BID) | ORAL | 0 refills | Status: DC | PRN

## 2016-09-03 MED ORDER — IBUPROFEN 400 MG PO TABS: 400.0000 mg | ORAL_TABLET | Freq: Four times a day (QID) | ORAL | Status: DC | PRN

## 2016-09-03 MED ORDER — ONDANSETRON HCL 4 MG PO TABS: 4.0000 mg | ORAL_TABLET | Freq: Four times a day (QID) | ORAL | Status: DC | PRN

## 2016-09-03 NOTE — ED Notes (Signed)
Admitting MD stated only 1 liter bolus at this time.

## 2016-09-03 NOTE — Progress Notes (Signed)
Evaluated patient at bedside with Dr. Hetty Ely as patient's lactic acid level is rising. Lactate 3.14 on admission and now 4.2. She has received 4 boluses of 1 L NS. BPs stable in 0000000 systolic.   Patient reports she is feeling ok. She notes some lower back pain and has abdominal pain. She tried to eat but had nausea with vomiting. She reports she has been urinating normally.   On exam, she is resting comfortably in bed, NAD. Abdomen is diffusely tender, but most significantly in the right upper and lower quadrants. She has significant right sided costovertebral tenderness.   Assessment/Plan: Victoria Gordon is a 35yo woman admitted for severe abdominal pain with severe nausea and vomiting found to be septic secondary to pyelonephritis. She is afebrile and BPs stable in the 90s currently. She is on IV Ceftriaxone with blood and urine cultures pending. She appears to be very stable at this time despite her increasing lactic acid level. Will continue to trend lactate and give her an additional 1 L NS bolus to help support her BP.   Albin Felling, MD, MPH Internal Medicine Resident, PGY-III Pager: [phone]

## 2016-09-03 NOTE — ED Triage Notes (Signed)
Patient complains of generalized abdominal pain with vomiting, diarrhea and fever since Thursday, patient appears uncomfortable on arrival.

## 2016-09-03 NOTE — ED Notes (Signed)
Pt to CT

## 2016-09-03 NOTE — H&P (Signed)
Date: 09/03/2016               Patient Name:  Victoria Gordon MRN: [id number]  DOB: [birth year] Age / Sex: 35 y.o., female   PCP: No Pcp Per Patient         Medical Service: Internal Medicine Teaching Service         Attending Physician: Dr. Algis Greenhouse    First Contact: Dr. Asencion Partridge Pager: [phone]  Second Contact: Dr. Burgess Estelle Pager: [phone]       After Hours (After 5p/  First Contact Pager: [phone]  weekends / holidays): Second Contact Pager: [phone]   Chief Complaint: Abdominal pain  History of Present Illness: Victoria Gordon is a 35 y.o. woman with PMH alcohol abuse, substance abuse, MDD, hemorrhoids, PTSD, and a PSHx of cholecystectomy and bilateral tubal ligation who presents for 2 days of acute progressive severe abdominal pain with nausea and vomiting. She states that she has had mild intermittent abdominal pain of similar quality for the last 2-3 months but this time "it kept getting worse." She characterizes her pain as sharp, constant, located on lateral right and left, radiating to her back. She tried Tylenol PM at home with minimal effect. She had several bouts of vomiting (NBNB) last night, and persistent nausea today. She endorses dysuria, frequency, urgency at home over the past 2 days. She reports loss of appetite over the past 2 weeks and poor po intake for several days, now with dizziness with standing and walking. She denies fevers, chills, diarrhea, constipation, vaginal discharge, chest pain, shortness of breath.  In the ED - presented with temp 100.5, HR 120, RR 20, BP 141/92, 100% on RA with dry MM, abdominal and CVA tenderness on exam. Labs remarkable for WBC 14, Hb 8.4, initial lactic acid 3.14, hCG negative, U/A nitrite positive, microscopic with TNTC leuks, few bacteria. Sepsis protocol initiated and received 3L bolus, IV Rocephin.  CT renal stone study showed bilateral mild hydroureter with periureteral stranding, no stone or  hydronephrosis, consstent with bilateral ascending UTI or pyelo. Blood and urine cultures sent. Pain improved with IV morphine 4mg  x2 and 1g Tylenol. IMTS contacted, admission for pyelo and sepsis.   At time of admitting team visit - temp 102.8, HR 118, RR 23, BP 101/53, SpO2 100% on room air. Repeat lactic acid 3.53. Ordered 2 additional liters IVF.   Meds:  Current Meds  Medication Sig  . acetaminophen (TYLENOL) 500 MG tablet Take 1,000 mg by mouth every 6 (six) hours as needed for moderate pain or headache.  . sertraline (ZOLOFT) 100 MG tablet Take 1 tablet (100 mg total) by mouth daily. For depression    Allergies: Allergies as of 09/03/2016  . (No Known Allergies)   Past Medical History:  Diagnosis Date  . Alcohol abuse   . Anxiety   . Hemorrhoid   . Major depressive disorder   . PTSD (post-traumatic stress disorder)   . Substance abuse     Family History:  Family History  Problem Relation Age of Onset  . Diabetes Maternal Grandmother   . Diabetes Paternal Grandfather     Social History:  Social History   Social History  . Marital status: Single    Spouse name: N/A  . Number of children: N/A  . Years of education: N/A   Occupational History  . Not on file.   Social History Main Topics  . Smoking status: Never Smoker  . Smokeless tobacco: Never  Used  . Alcohol use No  . Drug use: No  . Sexual activity: Yes    Birth control/ protection: None   Other Topics Concern  . Not on file   Social History Narrative  . No narrative on file    Review of Systems: A complete ROS was negative except as per HPI.   Physical Exam: Blood pressure (!) 103/54, pulse 114, temperature 102.8 F (39.3 C), resp. rate 22, height 5\' 1"  (1.549 m), weight 86.4 kg (190 lb 7 oz), last menstrual period 07/31/2016, SpO2 99 %.  General appearance: Obese woman laying in ED stretcher, in moderate distress, uncomfortable, shifting in bed, and grimacing intermittently HENT:  Normocephalic, atraumatic, moist mucous membranes, neck supple, no JVD Eyes: PERRL, non-icteric Cardiovascular: Tachycardic rate, normal rhythm, no murmurs, rubs, gallops Respiratory: Clear to auscultation bilaterally, mildly tachypneic  Abdomen: Moderately tender to palpation on right and left side, no rebound or guarding, BS+, soft, non-distended Extremities/Back: Normal bulk and range of motion, no edema, 2+ peripheral pulses, CVA tenderness bilaterally, lumbar spinal tenderness to palpation Skin: Warm, dry, intact Neuro: Alert and oriented, cranial nerves grossly intact Psych: Blunt affect, clear speech, thoughts linear and goal-directed  EKG: Sinus tach  Ct Renal Stone Study  Result Date: 09/03/2016 CLINICAL DATA:  Bilateral flank pain, elevated lactate, elevated white count, urinary tract infection EXAM: CT ABDOMEN AND PELVIS WITHOUT CONTRAST TECHNIQUE: Multidetector CT imaging of the abdomen and pelvis was performed following the standard protocol without IV contrast. COMPARISON:  05/24/2016 FINDINGS: Lower chest: Minor dependent basilar atelectasis. Small hiatal hernia noted. Normal heart size. No pericardial or pleural effusion. Hepatobiliary: Diffuse hypoattenuation of the liver with enlargement. Liver measures 27.5 cm in length. Remote cholecystectomy noted. No intrahepatic biliary dilatation or definite focal hepatic abnormality. Pancreas: Unremarkable. No pancreatic ductal dilatation or surrounding inflammatory changes. Spleen: Normal in size without focal abnormality. Adrenals/Urinary Tract: Normal adrenal glands. Kidneys demonstrate no acute obstruction, hydronephrosis, or obstructing ureteral calculus. Both ureters demonstrate mild hydroureter with periureteral strandy edema overlying the ileal psoas muscles bilaterally extending into the pelvis. Appearance is compatible with ascending urinary tract infection or pyelonephritis without associated stone disease or obstruction.  Stomach/Bowel: Stomach is within normal limits. Appendix appears normal. No evidence of bowel wall thickening, distention, or inflammatory changes. Vascular/Lymphatic: No significant vascular findings are present. No enlarged abdominal or pelvic lymph nodes. Reproductive: Right ovarian hypodense cyst measures 5.4 cm, image 71. Stable left adnexal heterogeneous fat density lesion with peripheral calcifications measuring 3.2 x 2.8 cm compatible with a left ovarian dermoid. Other: No abdominal wall hernia or abnormality. No abdominopelvic ascites. Musculoskeletal: No acute or significant osseous findings. IMPRESSION: Bilateral mild hydroureter with periureteral strandy edema without obstructing ureteral calculus or hydronephrosis. Appearance is compatible with bilateral ascending urinary tract infection or pyelonephritis. 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  Shick M.D.   On: 09/03/2016 15:12    Assessment & Plan by Problem:  Sepsis, secondary to bilateral pyelonephritis vs ascending UTI confirmed on CT, persistent tachycardia, tachypnea, fever, hypotension despite IVF, elevated lactic acid.  - persistent tachycardia/hypotension despite initial 3L bolus, ordered additional 2L IVF and 2nd PIV placement - continue to IVF as needed, if hypotension worsens despite IVF will require transfer to ICU/pressors - continue 1g Rocephin IV QD, received 2g IV loading dose - trend lactic acid Q4 until it begins trending down - follow  blood and urine cultures - tylenol and Ibuprofen PRN for pain/fever - for severe pain can re-dose IV Morphine 4mg  - Zofran po/IV PRN for nausea - AM CBC, BMP - tele/pulse ox - vitals Q4  Depression - continue home Zoloft  Left adnexal teratoma, seen on CT A/P on 05/24/2016, 3.2 x 2.8cm - Follow up ultrasound with  PCP  Elevated alkaline phosphatase, 246 this admission, also AST 162, history of cholecystectomy - Outpatient workup - GGT, PTH, vitamin D  FEN/GI: Regular diet, replete electrolytes as needed  DVT ppx: Lovenox  Code status: Full code  Dispo: Admit patient to Observation with expected length of stay less than 2 midnights.  Signed: Asencion Partridge, MD 09/03/2016, 6:30 PM  Pager: [phone]

## 2016-09-03 NOTE — ED Provider Notes (Signed)
[facility] DEPT Provider Note   CSN: [id number] Arrival date & time: 09/03/16  1224     History   Chief Complaint Chief Complaint  Patient presents with  . Abdominal Pain  . Emesis  . Fever    HPI Victoria Gordon is a 35 y.o. female with a PMHx of alcohol abuse, substance abuse, MDD, hemorrhoids, PTSD, and a PSHx of cholecystectomy and bilateral tubal ligation, who presents to the ED with complaints of abdominal pain, nausea, and vomiting that began on Thursday 2 days ago. Patient describes her abdominal pain as 9/10 constant sharp upper abdominal pain, radiating to both flanks, with no known aggravating or alleviating factors given that she has not tried anything prior to arrival. She has had 5 episodes of nonbloody nonbilious emesis. She was found to be febrile here at 100.5, she had not noticed any fevers at home. She endorses associated dysuria, increased urinary frequency, and malodorous urine. She denies any chest pain, shortness breath, cough or URI symptoms, hematemesis, melena, hematochezia, diarrhea, constipation, hematuria, vaginal bleeding or discharge, numbness, tingling, focal weakness, recent travel, sick contacts, suspicious food intake, alcohol use, or chronic NSAID use. Of note, triage note reported diarrhea, but pt denies this as a symptom; states she's having "regular normal" BMs, no watery or loose stools reported. States she's otherwise healthy, with no DM2 or other comorbidities, aside from having hemorrhoids, and was told she has a cyst on her R ovary-- was told to f/up with OBGYN but hasn't yet. Has no PCP at this time.   The history is provided by the patient and medical records. No language interpreter was used.  Abdominal Pain   This is a new problem. The current episode started 2 days ago. The problem occurs constantly. The problem has not changed since onset.The pain is associated with an unknown factor. The pain is located in the LUQ, RUQ and epigastric  region. The quality of the pain is sharp. The pain is at a severity of 9/10. The pain is moderate. Associated symptoms include fever (100.5 here), nausea, vomiting, dysuria and frequency. Pertinent negatives include diarrhea, flatus, hematochezia, melena, constipation, hematuria, arthralgias and myalgias. Nothing aggravates the symptoms. Nothing relieves the symptoms.  Emesis   Associated symptoms include abdominal pain and a fever (100.5 here). Pertinent negatives include no arthralgias, no cough, no diarrhea and no myalgias.  Fever   Associated symptoms include vomiting. Pertinent negatives include no chest pain, no diarrhea and no cough.    Past Medical History:  Diagnosis Date  . Alcohol abuse   . Anxiety   . Hemorrhoid   . Major depressive disorder   . PTSD (post-traumatic stress disorder)   . Substance abuse     Patient Active Problem List   Diagnosis Date Noted  . Alcohol use disorder, severe, dependence ([place]) 01/30/2016  . PTSD (post-traumatic stress disorder) 01/30/2016  . MDD (major depressive disorder), recurrent severe, without psychosis ([place]) 01/30/2016  . Hypokalemia 01/30/2016  . Rubella non-immune status, antepartum 02/18/2014  . UTI (urinary tract infection) in pregnancy in second trimester 02/17/2014    Past Surgical History:  Procedure Laterality Date  . CESAREAN SECTION    . CHOLECYSTECTOMY    . MOUTH SURGERY    . TUBAL LIGATION Bilateral 07/22/2014   Procedure: POST PARTUM TUBAL LIGATION;  Surgeon: Woodroe Mode, MD;  Location: [facility];  Service: Gynecology;  Laterality: Bilateral;    OB History    Gravida Para Term Preterm AB Living   7 5  5 0 2 5   SAB TAB Ectopic Multiple Live Births   2 0 0 0 5       Home Medications    Prior to Admission medications   Medication Sig Start Date End Date Taking? Authorizing Provider  ferrous sulfate 325 (65 FE) MG tablet Take 1 tablet (325 mg total) by mouth 3 (three) times daily with meals. 05/24/16   Rolland Porter,  MD  hydrocortisone (ANUCORT-HC) 25 MG suppository Place 1 suppository (25 mg total) rectally 2 (two) times daily. 05/24/16   Rolland Porter, MD  potassium chloride SA (K-DUR,KLOR-CON) 20 MEQ tablet Take 1 tablet (20 mEq total) by mouth 2 (two) times daily. 05/24/16   Rolland Porter, MD  sertraline (ZOLOFT) 100 MG tablet Take 1 tablet (100 mg total) by mouth daily. For depression 02/05/16   Encarnacion Slates, NP  traZODone (DESYREL) 150 MG tablet Take 1 tablet (150 mg total) by mouth at bedtime. For insomnia 02/05/16   Encarnacion Slates, NP    Family History Family History  Problem Relation Age of Onset  . Diabetes Maternal Grandmother   . Diabetes Paternal Grandfather     Social History Social History  Substance Use Topics  . Smoking status: Never Smoker  . Smokeless tobacco: Never Used  . Alcohol use No     Allergies   Review of patient's allergies indicates no known allergies.   Review of Systems Review of Systems  Constitutional: Positive for fever (100.5 here).  HENT: Negative for rhinorrhea.   Respiratory: Negative for cough and shortness of breath.   Cardiovascular: Negative for chest pain.  Gastrointestinal: Positive for abdominal pain, nausea and vomiting. Negative for blood in stool, constipation, diarrhea, flatus, hematochezia and melena.  Genitourinary: Positive for dysuria, flank pain and frequency. Negative for hematuria, vaginal bleeding and vaginal discharge.       +malodorous urine  Musculoskeletal: Negative for arthralgias and myalgias.  Skin: Negative for color change.  Allergic/Immunologic: Negative for immunocompromised state.  Neurological: Negative for weakness and numbness.  Psychiatric/Behavioral: Negative for confusion.   10 Systems reviewed and are negative for acute change except as noted in the HPI.   Physical Exam Updated Vital Signs BP 141/92 (BP Location: Right Arm)   Pulse 120   Temp 100.5 F (38.1 C)   Resp 20   Ht _0  (1.549 m)   Wt 86.4 kg   LMP  07/31/2016   SpO2 100%   BMI 35.98 kg/m   Physical Exam  Constitutional: She is oriented to person, place, and time. She appears well-developed and well-nourished.  Non-toxic appearance. No distress.  Febrile 100.5, nontoxic, NAD although appears uncomfortable but not ill or toxic  HENT:  Head: Normocephalic and atraumatic.  Mouth/Throat: Oropharynx is clear and moist. Mucous membranes are dry.  Dry mucous membranes  Eyes: Conjunctivae and EOM are normal. Right eye exhibits no discharge. Left eye exhibits no discharge.  Neck: Normal range of motion. Neck supple.  Cardiovascular: Regular rhythm, normal heart sounds and intact distal pulses.  Tachycardia present.  Exam reveals no gallop and no friction rub.   No murmur heard. Mildly tachycardic in the 110-120s  Pulmonary/Chest: Effort normal and breath sounds normal. No respiratory distress. She has no decreased breath sounds. She has no wheezes. She has no rhonchi. She has no rales.  Abdominal: Soft. Normal appearance and bowel sounds are normal. She exhibits no distension. There is tenderness in the right upper quadrant, epigastric area and left upper quadrant. There is  CVA tenderness. There is no rigidity, no rebound, no guarding, no tenderness at McBurney's point and negative Murphy's sign.  Soft, nondistended, +BS throughout, with very mild upper abd TTP, no r/g/r, neg murphy's, neg mcburney's, with moderate b/l CVA TTP   Musculoskeletal: Normal range of motion.  Neurological: She is alert and oriented to person, place, and time. She has normal strength. No sensory deficit.  Skin: Skin is warm, dry and intact. No rash noted.  Psychiatric: She has a normal mood and affect.  Nursing note and vitals reviewed.    ED Treatments / Results  Labs (all labs ordered are listed, but only abnormal results are displayed) Labs Reviewed  COMPREHENSIVE METABOLIC PANEL - Abnormal; Notable for the following:       Result Value   Glucose, Bld 125  (*)    BUN <5 (*)    Calcium 8.7 (*)    Albumin 3.1 (*)    AST 162 (*)    Alkaline Phosphatase 246 (*)    Total Bilirubin 1.5 (*)    All other components within normal limits  CBC - Abnormal; Notable for the following:    WBC 14.0 (*)    RBC 3.32 (*)    Hemoglobin 8.4 (*)    HCT 28.4 (*)    MCH 25.3 (*)    MCHC 29.6 (*)    RDW 20.2 (*)    All other components within normal limits  URINALYSIS, ROUTINE W REFLEX MICROSCOPIC (NOT AT [facility]) - Abnormal; Notable for the following:    APPearance CLOUDY (*)    Hgb urine dipstick LARGE (*)    Protein, ur 100 (*)    Nitrite POSITIVE (*)    Leukocytes, UA LARGE (*)    All other components within normal limits  DIFFERENTIAL - Abnormal; Notable for the following:    Neutro Abs 11.4 (*)    All other components within normal limits  URINE MICROSCOPIC-ADD ON - Abnormal; Notable for the following:    Squamous Epithelial / LPF 0-5 (*)    Bacteria, UA FEW (*)    All other components within normal limits  I-STAT CG4 LACTIC ACID, ED - Abnormal; Notable for the following:    Lactic Acid, Venous 3.14 (*)    All other components within normal limits  I-STAT CG4 LACTIC ACID, ED - Abnormal; Notable for the following:    Lactic Acid, Venous 3.53 (*)    All other components within normal limits  URINE CULTURE  CULTURE, BLOOD (ROUTINE X 2)  CULTURE, BLOOD (ROUTINE X 2)  LIPASE, BLOOD  I-STAT BETA HCG BLOOD, ED ([facility], [facility], [facility] ONLY)    EKG  EKG Interpretation None       Radiology Ct Renal Stone Study  Result Date: 09/03/2016 CLINICAL DATA:  Bilateral flank pain, elevated lactate, elevated white count, urinary tract infection EXAM: CT ABDOMEN AND PELVIS WITHOUT CONTRAST TECHNIQUE: Multidetector CT imaging of the abdomen and pelvis was performed following the standard protocol without IV contrast. COMPARISON:  05/24/2016 FINDINGS: Lower chest: Minor dependent basilar atelectasis. Small hiatal hernia noted. Normal heart size. No pericardial or pleural  effusion. Hepatobiliary: Diffuse hypoattenuation of the liver with enlargement. Liver measures 27.5 cm in length. Remote cholecystectomy noted. No intrahepatic biliary dilatation or definite focal hepatic abnormality. Pancreas: Unremarkable. No pancreatic ductal dilatation or surrounding inflammatory changes. Spleen: Normal in size without focal abnormality. Adrenals/Urinary Tract: Normal adrenal glands. Kidneys demonstrate no acute obstruction, hydronephrosis, or obstructing ureteral calculus. Both ureters demonstrate mild hydroureter with periureteral strandy  edema overlying the ileal psoas muscles bilaterally extending into the pelvis. Appearance is compatible with ascending urinary tract infection or pyelonephritis without associated stone disease or obstruction. Stomach/Bowel: Stomach is within normal limits. Appendix appears normal. No evidence of bowel wall thickening, distention, or inflammatory changes. Vascular/Lymphatic: No significant vascular findings are present. No enlarged abdominal or pelvic lymph nodes. Reproductive: Right ovarian hypodense cyst measures 5.4 cm, image 71. Stable left adnexal heterogeneous fat density lesion with peripheral calcifications measuring 3.2 x 2.8 cm compatible with a left ovarian dermoid. Other: No abdominal wall hernia or abnormality. No abdominopelvic ascites. Musculoskeletal: No acute or significant osseous findings. IMPRESSION: Bilateral mild hydroureter with periureteral strandy edema without obstructing ureteral calculus or hydronephrosis. Appearance is compatible with bilateral ascending urinary tract infection or pyelonephritis. 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  Shick M.D.   On: 09/03/2016 15:12    Procedures Procedures (including critical care time)  CRITICAL CARE- sepsis Performed by:  Corine Shelter   Total critical care time: 45 minutes  Critical care time was exclusive of separately billable procedures and treating other patients.  Critical care was necessary to treat or prevent imminent or life-threatening deterioration.  Critical care was time spent personally by me on the following activities: development of treatment plan with patient and/or surrogate as well as nursing, discussions with consultants, evaluation of patient's response to treatment, examination of patient, obtaining history from patient or surrogate, ordering and performing treatments and interventions, ordering and review of laboratory studies, ordering and review of radiographic studies, pulse oximetry and re-evaluation of patient's condition.   Medications Ordered in ED Medications  0.9 %  sodium chloride infusion (1,000 mLs Intravenous New Bag/Given 09/03/16 1320)  cefTRIAXone (ROCEPHIN) 1 g in dextrose 5 % 50 mL IVPB (not administered)  sodium chloride 0.9 % bolus 1,000 mL (0 mLs Intravenous Stopped 09/03/16 1512)    And  sodium chloride 0.9 % bolus 1,000 mL (0 mLs Intravenous Stopped 09/03/16 1512)    And  sodium chloride 0.9 % bolus 1,000 mL (0 mLs Intravenous Stopped 09/03/16 1512)  cefTRIAXone (ROCEPHIN) 2 g in dextrose 5 % 50 mL IVPB (0 g Intravenous Stopped 09/03/16 1509)  ondansetron (ZOFRAN) injection 4 mg (4 mg Intravenous Given 09/03/16 1316)  morphine 4 MG/ML injection 4 mg (4 mg Intravenous Given 09/03/16 1316)  acetaminophen (TYLENOL) tablet 1,000 mg (1,000 mg Oral Given 09/03/16 1316)  ibuprofen (ADVIL,MOTRIN) tablet 800 mg (800 mg Oral Given 09/03/16 1540)  morphine 4 MG/ML injection 4 mg (4 mg Intravenous Given 09/03/16 1541)     Initial Impression / Assessment and Plan / ED Course  I have reviewed the triage vital signs and the nursing notes.  Pertinent labs & imaging results that were available during my care of the patient were reviewed by me and considered in my  medical decision making (see chart for details).  Clinical Course    35 y.o. female here with generalized abd pain/n/v/urinary symptoms x2 days with fever 100.5 on arrival. On exam, very mild generalized upper abd TTP, nonperitoneal, with moderate b/l flank TTP; mildly tachycardic. Nontoxic appearing, although appears uncomfortable. Technically meets sepsis criteria, but overall pt is well appearing; will hold on calling code sepsis, but will start labs accordingly. Lactic returning already, and is 3.14; will give weight based fluids. Likely urinary source, symptoms and exam c/w pyelonephritis. Doubt need for emergent  imaging at this time, but will reassess after labs and meds given and see how she responds. Will give tylenol, fluids, rocephin empirically, morphine/zofran, and reassess after that. Of note, UCx and BCx sent. Doubt need for pelvic exam at this point, given no lower abd tenderness and no pelvic complaints.   2:11 PM CBC w/diff with leukocytosis WBC 14.0 with slight neutrophilic predominance; also with anemia near baseline Hgb 8.4, will need to go home with iron supplementation, will also need to f/up with a PCP regarding her anemia. U/A with +nitrites, large leuks, TNTC WBC although few bacteria seen but overall c/w UTI/pyelonephritis; rocephin given already, UCx sent. BetaHCG neg. Lipase WNL. CMP with alk phos 246 which is similar to prior values, AST 162, bili 1.5; some of that could be from dehydration, some could be chronic or potentially from fatty liver disease, etc; given that pt's had cholecystectomy, she could theoretically have a lodged biliary tree stone but given lack of significant RUQ TTP, doubt need for U/S. Will however proceed with CT noncontrast of abdomen to ensure no nephrolithiasis, and to be able to evaluate the biliary tree to some degree. Pt feeling much better after meds, HR improving with fluids, temp coming down after tylenol, tolerating PO well at this point.  Awaiting repeat lactic after fluids finish. Will reassess shortly.   3:37 PM CT with findings c/w b/l pyelonephritis, no nephrolithiasis, mild hepatomegaly with hepatic steatosis, R ovarian cyst and L dermoid on ovary which is stable; otherwise no other acute findings.  Pt feeling somewhat better with nausea, and tolerating PO well, but pain is returning and she's shivering; temp creeping back up to 100.1, will give ibuprofen now, and more pain meds. Awaiting repeat lactic to be done at 4pm, as long as this is improving after the fluids we gave her, and symptoms improving and continuing to tolerate PO well, I anticipate that she could go home with bactrim, zofran, pain meds, and iron supplementation. If symptoms uncontrolled, or lactic not improving, then may need to admit for inpatient management. Will reassess after second lactic done, and meds given.   4:14 PM Repeat lactic increasing to 3.53, will proceed with admission given rising lactic despite fluids, for IP admission of her b/l pyelonephritis. Of note, pain currently improved after meds given. No current concerns or questions. Agrees with plan. My attending Dr. Thomasene Lot also agrees with plan, and saw pt as well.   4:29 PM Dr. Tiburcio Pea of internal medicine residency returning page and will admit. He asked that we not place holding orders, he will be down to see pt and place admit orders accordingly. Please see their notes for further documentation of care. I appreciate their help with this pleasant pt's care. Pt stable at time of admission.   Final Clinical Impressions(s) / ED Diagnoses   Final diagnoses:  Pain of upper abdomen  Nausea and vomiting in adult patient  Pyelonephritis  Fever, unspecified fever cause  Neutrophilic leukocytosis  Chronic anemia  Elevated LFTs  Hepatic steatosis  Cyst of right ovary  Dermoid cyst of left ovary  Sepsis, due to unspecified organism ([place])  Elevated lactic acid level    New  Prescriptions Current Discharge Medication List       Zacarias Pontes, PA-C 09/03/16 [place], MD 09/04/16 1540

## 2016-09-03 NOTE — ED Notes (Signed)
Admitting MD notified of pt HR and temp, despite fluids and medicine.

## 2016-09-03 NOTE — ED Notes (Signed)
Pt tolerating PO fluids

## 2016-09-03 NOTE — Progress Notes (Signed)
Results for JULAINE, MEADER (MRN [id number]) as of 09/03/2016 22:30  Ref. Range 09/03/2016 21:33  Lactic Acid, Venous Latest Ref Range: 0.5 - 1.9 mmol/L 4.2 (HH)  MD on call paged. Awaiting return call.

## 2016-09-04 DIAGNOSIS — D649 Anemia, unspecified: Secondary | ICD-10-CM

## 2016-09-04 LAB — BLOOD CULTURE ID PANEL (REFLEXED)
Acinetobacter baumannii: NOT DETECTED
CANDIDA GLABRATA: NOT DETECTED
CANDIDA TROPICALIS: NOT DETECTED
CARBAPENEM RESISTANCE: NOT DETECTED
Candida albicans: NOT DETECTED
Candida krusei: NOT DETECTED
Candida parapsilosis: NOT DETECTED
ENTEROCOCCUS SPECIES: NOT DETECTED
ESCHERICHIA COLI: DETECTED — AB
Enterobacter cloacae complex: NOT DETECTED
Enterobacteriaceae species: DETECTED — AB
HAEMOPHILUS INFLUENZAE: NOT DETECTED
Klebsiella oxytoca: NOT DETECTED
Klebsiella pneumoniae: NOT DETECTED
LISTERIA MONOCYTOGENES: NOT DETECTED
METHICILLIN RESISTANCE: NOT DETECTED
Neisseria meningitidis: NOT DETECTED
PROTEUS SPECIES: NOT DETECTED
Pseudomonas aeruginosa: NOT DETECTED
SERRATIA MARCESCENS: NOT DETECTED
STAPHYLOCOCCUS SPECIES: NOT DETECTED
STREPTOCOCCUS PYOGENES: NOT DETECTED
Staphylococcus aureus (BCID): NOT DETECTED
Streptococcus agalactiae: NOT DETECTED
Streptococcus pneumoniae: NOT DETECTED
Streptococcus species: NOT DETECTED
VANCOMYCIN RESISTANCE: NOT DETECTED

## 2016-09-04 LAB — CBC
HCT: 24.2 % — ABNORMAL LOW (ref 36.0–46.0)
HEMOGLOBIN: 7.2 g/dL — AB (ref 12.0–15.0)
MCH: 25.5 pg — ABNORMAL LOW (ref 26.0–34.0)
MCHC: 29.8 g/dL — AB (ref 30.0–36.0)
MCV: 85.8 fL (ref 78.0–100.0)
Platelets: 207 10*3/uL (ref 150–400)
RBC: 2.82 MIL/uL — AB (ref 3.87–5.11)
RDW: 20.7 % — ABNORMAL HIGH (ref 11.5–15.5)
WBC: 17.5 10*3/uL — ABNORMAL HIGH (ref 4.0–10.5)

## 2016-09-04 LAB — LACTIC ACID, PLASMA
LACTIC ACID, VENOUS: 3.7 mmol/L — AB (ref 0.5–1.9)
LACTIC ACID, VENOUS: 3 mmol/L — AB (ref 0.5–1.9)

## 2016-09-04 LAB — BASIC METABOLIC PANEL WITH GFR
Anion gap: 10 (ref 5–15)
BUN: 5 mg/dL — ABNORMAL LOW (ref 6–20)
CO2: 17 mmol/L — ABNORMAL LOW (ref 22–32)
Calcium: 6.6 mg/dL — ABNORMAL LOW (ref 8.9–10.3)
Chloride: 110 mmol/L (ref 101–111)
Creatinine, Ser: 0.71 mg/dL (ref 0.44–1.00)
GFR calc Af Amer: >60 mL/min
GFR calc non Af Amer: >60 mL/min
Glucose, Bld: 114 mg/dL — ABNORMAL HIGH (ref 65–99)
Potassium: 3.3 mmol/L — ABNORMAL LOW (ref 3.5–5.1)
Sodium: 137 mmol/L (ref 135–145)

## 2016-09-04 LAB — GLUCOSE, CAPILLARY
Glucose-Capillary: 100 mg/dL — ABNORMAL HIGH (ref 65–99)
Glucose-Capillary: 100 mg/dL — ABNORMAL HIGH (ref 65–99)
Glucose-Capillary: 93 mg/dL (ref 65–99)

## 2016-09-04 MED ORDER — POTASSIUM CHLORIDE CRYS ER 20 MEQ PO TBCR
40.0000 meq | EXTENDED_RELEASE_TABLET | Freq: Once | ORAL | Status: AC
  Administered 2016-09-04: 40 meq via ORAL
  Filled 2016-09-04: qty 2

## 2016-09-04 MED ORDER — DEXTROSE 5 % IV SOLN
2.0000 g | INTRAVENOUS | Status: DC
  Administered 2016-09-04: 2 g via INTRAVENOUS
  Filled 2016-09-04 (×2): qty 2

## 2016-09-04 NOTE — Progress Notes (Signed)
Two  Nurses  Assessed.No good veins visible for  PIV.

## 2016-09-04 NOTE — Progress Notes (Addendum)
Subjective: Feeling better this morning, pain improved. Still dizzy with sitting up. Blood culture grew E. Coli overnight. Lactic acidosis peaked at 4.2 at 2130, now resolving. Patient receiving 125cc/hr mIVF and received 2L bolus around midnight. Pressures remain soft but tachycardia and fevers resolved. Net +7.6L   Objective: Vital signs in last 24 hours: Vitals:   09/03/16 1836 09/03/16 2125 09/03/16 2333 09/04/16 0537  BP: (!) 106/55 (!) 91/46 (!) 98/57 (!) 98/58  Pulse: (!) 110 100 94 94  Resp: 19 18  18   Temp: 98.9 F (37.2 C) 97.6 F (36.4 C)  97.7 F (36.5 C)  TempSrc: Oral Oral  Oral  SpO2: 99% 99%  99%  Weight:  91.1 kg (200 lb 13.4 oz)    Height:        Intake/Output Summary (Last 24 hours) at 09/04/16 0650 Last data filed at 09/04/16 0600  Gross per 24 hour  Intake          8282.92 ml  Output              650 ml  Net          7632.92 ml   Physical Exam General appearance: Well-nourished woman resting in bed, in no distress HENT: Normocephalic, atraumatic, moist MMs Cardiovascular: Regular rate and rhythm, no murmurs, rubs, gallops Respiratory/Chest: Clear to ausculation bilaterally, normal work of breathing Abdomen: Bowel sounds present, soft, mildly tender on sides, non-distended, CVA tenderness bilaterally Skin: Warm, dry, intact Neuro: Cranial nerves grossly intact, alert and oriented, hyperreflexive in lower extremities with 2-3 beats clonus, strength intact Psych: Appropriate affect  Labs / Imaging / Procedures: CBC Latest Ref Rng & Units 09/04/2016 09/03/2016 05/24/2016  WBC 4.0 - 10.5 K/uL 17.5(H) 14.0(H) 6.9  Hemoglobin 12.0 - 15.0 g/dL 7.2(L) 8.4(L) 9.4(L)  Hematocrit 36.0 - 46.0 % 24.2(L) 28.4(L) 29.2(L)  Platelets 150 - 400 K/uL 207 262 237   BMP Latest Ref Rng & Units 09/04/2016 09/03/2016 05/24/2016  Glucose 65 - 99 mg/dL 114(H) 125(H) 133(H)  BUN 6 - 20 mg/dL 5(L) <5(L) <5(L)  Creatinine 0.44 - 1.00 mg/dL 0.71 0.61 0.47  Sodium 135 - 145 mmol/L  137 136 139  Potassium 3.5 - 5.1 mmol/L 3.3(L) 4.0 3.0(L)  Chloride 101 - 111 mmol/L 110 104 107  CO2 22 - 32 mmol/L 17(L) 23 27  Calcium 8.9 - 10.3 mg/dL 6.6(L) 8.7(L) 7.8(L)    Assessment/Plan: Victoria Gordon is a 35 y.o. woman with PMH alcohol abuse, substance abuse, MDD, hemorrhoids, PTSD admitted for sepsis, bilateral pyelonephritis, and now with E. Coli bacteremia.   Sepsis, with E. Coli bacteremia from bilateral pyelo, improving, WBC 17.5 today from 14 but vitals improving and afebrile. Initially was tachycardic/tachypneic with rising lactate and worsening hypotension despite initial 3L bolus, lactate peaked at 4.2 overnight then trended down, received 2-3 additional IVF boluses but today has stabilized on mIVF, blood pressure is improving 110s/70, tachypnea resolved and remained afebrile since night of admission (Tmax 102.8). Blood culture grew E. 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   LOS: 0 days   Asencion Partridge, MD 09/04/2016, 6:50 AM Pager: [phone]

## 2016-09-04 NOTE — Progress Notes (Signed)
PHARMACY - PHYSICIAN COMMUNICATION CRITICAL VALUE ALERT - BLOOD CULTURE IDENTIFICATION (BCID)  Results for orders placed or performed during the hospital encounter of 09/03/16  Blood Culture ID Panel (Reflexed) (Collected: 09/03/2016  1:07 PM)  Result Value Ref Range   Enterococcus species NOT DETECTED NOT DETECTED   Vancomycin resistance NOT DETECTED NOT DETECTED   Listeria monocytogenes NOT DETECTED NOT DETECTED   Staphylococcus species NOT DETECTED NOT DETECTED   Staphylococcus aureus NOT DETECTED NOT DETECTED   Methicillin resistance NOT DETECTED NOT DETECTED   Streptococcus species NOT DETECTED NOT DETECTED   Streptococcus agalactiae NOT DETECTED NOT DETECTED   Streptococcus pneumoniae NOT DETECTED NOT DETECTED   Streptococcus pyogenes NOT DETECTED NOT DETECTED   Acinetobacter baumannii NOT DETECTED NOT DETECTED   Enterobacteriaceae species DETECTED (A) NOT DETECTED   Enterobacter cloacae complex NOT DETECTED NOT DETECTED   Escherichia coli DETECTED (A) NOT DETECTED   Klebsiella oxytoca NOT DETECTED NOT DETECTED   Klebsiella pneumoniae NOT DETECTED NOT DETECTED   Proteus species NOT DETECTED NOT DETECTED   Serratia marcescens NOT DETECTED NOT DETECTED   Carbapenem resistance NOT DETECTED NOT DETECTED   Haemophilus influenzae NOT DETECTED NOT DETECTED   Neisseria meningitidis NOT DETECTED NOT DETECTED   Pseudomonas aeruginosa NOT DETECTED NOT DETECTED   Candida albicans NOT DETECTED NOT DETECTED   Candida glabrata NOT DETECTED NOT DETECTED   Candida krusei NOT DETECTED NOT DETECTED   Candida parapsilosis NOT DETECTED NOT DETECTED   Candida tropicalis NOT DETECTED NOT DETECTED    Name of physician (or Provider) Contacted: Dr. Hetty Ely  Changes to prescribed antibiotics required: Increase ceftriaxone dose to 2gm IV q24h for bacteremia.  Sherlon Handing, PharmD, BCPS Clinical pharmacist, pager [phone] 09/04/2016  5:00 AM

## 2016-09-05 DIAGNOSIS — K449 Diaphragmatic hernia without obstruction or gangrene: Secondary | ICD-10-CM

## 2016-09-05 DIAGNOSIS — K760 Fatty (change of) liver, not elsewhere classified: Secondary | ICD-10-CM | POA: Diagnosis present

## 2016-09-05 LAB — BASIC METABOLIC PANEL
ANION GAP: 6 (ref 5–15)
BUN: <5 mg/dL — ABNORMAL LOW (ref 6–20)
CALCIUM: 7.1 mg/dL — AB (ref 8.9–10.3)
CHLORIDE: 111 mmol/L (ref 101–111)
CO2: 23 mmol/L (ref 22–32)
Creatinine, Ser: 0.45 mg/dL (ref 0.44–1.00)
GFR calc non Af Amer: >60 mL/min (ref >60)
GLUCOSE: 94 mg/dL (ref 65–99)
POTASSIUM: 2.9 mmol/L — AB (ref 3.5–5.1)
Sodium: 140 mmol/L (ref 135–145)

## 2016-09-05 LAB — URINE CULTURE

## 2016-09-05 LAB — CBC
HEMATOCRIT: 22.3 % — AB (ref 36.0–46.0)
HEMOGLOBIN: 6.8 g/dL — AB (ref 12.0–15.0)
MCH: 26.2 pg (ref 26.0–34.0)
MCHC: 30.5 g/dL (ref 30.0–36.0)
MCV: 85.8 fL (ref 78.0–100.0)
Platelets: 194 10*3/uL (ref 150–400)
RBC: 2.6 MIL/uL — AB (ref 3.87–5.11)
RDW: 21 % — ABNORMAL HIGH (ref 11.5–15.5)
WBC: 7.6 10*3/uL (ref 4.0–10.5)

## 2016-09-05 LAB — GLUCOSE, CAPILLARY: Glucose-Capillary: 91 mg/dL (ref 65–99)

## 2016-09-05 MED ORDER — DEXTROSE 5 % IV SOLN
2.0000 g | INTRAVENOUS | Status: AC
  Administered 2016-09-05: 2 g via INTRAVENOUS
  Filled 2016-09-05: qty 2

## 2016-09-05 MED ORDER — CEPHALEXIN 500 MG PO CAPS: 500.0000 mg | ORAL_CAPSULE | Freq: Four times a day (QID) | ORAL | 0 refills | Status: AC

## 2016-09-05 MED ORDER — CEPHALEXIN 500 MG PO CAPS: 500.0000 mg | ORAL_CAPSULE | Freq: Four times a day (QID) | ORAL | Status: DC

## 2016-09-05 MED ORDER — POTASSIUM CHLORIDE CRYS ER 20 MEQ PO TBCR
40.0000 meq | EXTENDED_RELEASE_TABLET | Freq: Once | ORAL | Status: AC
Start: 2016-09-05 — End: 2016-09-05
  Administered 2016-09-05: 40 meq via ORAL
  Filled 2016-09-05: qty 2

## 2016-09-05 NOTE — Discharge Instructions (Signed)
Stay very well hydrated with plenty of water throughout the day. Take antibiotic until completed. You have anemia, take iron supplement as directed-- take in the morning with breakfast and orange juice. Follow up with internal medicine clinic on 11/15 for recheck of ongoing symptoms and to establish medical care. Return to ER for emergent changing or worsening of symptoms. Please seek immediate care if you develop the following:  You develop back pain.   Your symptoms are no better, or worse in 3 days.  There is severe back pain or lower abdominal pain.   You develop chills.   You have a fever.   There is nausea or vomiting.   There is continued burning or discomfort with urination.

## 2016-09-05 NOTE — Discharge Summary (Signed)
Name: Victoria Gordon MRN: [id number] DOB: Nov 28, 1980 35 y.o. PCP: No Pcp Per Patient  Date of Admission: 09/03/2016 12:38 PM Date of Discharge: 09/05/2016 Attending Physician: Lucious Groves, DO  Discharge Diagnosis: 1. Acute pyelonephritis 2. Escherichia coli urosepsis  Principal Problem:   Sepsis ([place]) Active Problems:   Pyelonephritis   Elevated lactic acid level   Discharge Medications:   Medication List    STOP taking these medications   ferrous sulfate 325 (65 FE) MG tablet   hydrocortisone 25 MG suppository Commonly known as:  ANUCORT-HC   potassium chloride SA 20 MEQ tablet Commonly known as:  K-DUR,KLOR-CON     TAKE these medications   acetaminophen 500 MG tablet Commonly known as:  TYLENOL Take 1,000 mg by mouth every 6 (six) hours as needed for moderate pain or headache.   cephALEXin 500 MG capsule Commonly known as:  KEFLEX Take 1 capsule (500 mg total) by mouth 4 (four) times daily. Start taking on:  09/06/2016   sertraline 100 MG tablet Commonly known as:  ZOLOFT Take 1 tablet (100 mg total) by mouth daily. For depression   traZODone 150 MG tablet Commonly known as:  DESYREL Take 1 tablet (150 mg total) by mouth at bedtime. For insomnia       Disposition and follow-up:   Ms.Victoria Gordon was discharged from [facility] in Stable condition.  At the hospital follow up visit please address:  1.  Bilateral pyelonephritis, secondary to ascending E. 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  Labs / imaging needed at time of follow-up: CBC, CMP  3.  Pending labs/ test needing follow-up: None  Follow-up Appointments: Follow-up Information    [facility]. Go on 09/14/2016.   Why:  At 10:15AM for hospital follow up appointment Contact information: [address] [place] Hospital Course by problem list: Principal Problem:   Sepsis [facility]) Active Problems:   Pyelonephritis   Elevated lactic acid level   1. Acute pyelonephritis causing E. Coli urosepsis, presented on 11/4 with 2 day history of progressive diffuse abdominal and flank pain with associated nausea and vomiting. Also with dysuria, urgency, and urinary frequency. Orthostatic dizziness. No fevers. On presentation to the emergency department temperature 100.5, pulse 120, respirations 20, upper sugar 141/92. Diffuse abdominal tenderness. Bilateral flank tenderness. White blood count 14,000. Lactic acid 3.1. CT abdomen revealed changes consistent with bilateral pyelo vs ascending UTI. Received IV rocephin, copious IVF via sepsis protocol, became increasingly hypotensive with lactic acid that trended up to 4.2 overnight. Blood and urine cultures grew E. Coli with 12 hours. Improving by 11/5 but with ongoing pain and some degree of hypotension/orthostasis. Continued to received IVF and rocephin. Sensitivities returned, pain resolved, and pressures returned to baseline by 11/6 and patient was deemed stable for discharge with course of po Keflex 500mg  QID after receiving her 3rd daily dose of IV Rocephin.  Discharge Vitals:   BP 127/77 (BP Location: Left  Arm)   Pulse 89   Temp 99 F (37.2 C) (Oral)   Resp 18   Ht 5\' 1"  (1.549 m)   Wt 91.1 kg (200 lb 13.4 oz)   LMP 07/31/2016   SpO2 99%   BMI 37.95 kg/m   Pertinent Labs, Studies, and Procedures:  CBC Latest Ref Rng & Units  09/05/2016 09/04/2016 09/03/2016  WBC 4.0 - 10.5 K/uL 7.6 17.5(H) 14.0(H)  Hemoglobin 12.0 - 15.0 g/dL 6.8(LL) 7.2(L) 8.4(L)  Hematocrit 36.0 - 46.0 % 22.3(L) 24.2(L) 28.4(L)  Platelets 150 - 400 K/uL 194 207 262   BMP Latest Ref Rng & Units 09/05/2016 09/04/2016 09/03/2016  Glucose 65 - 99 mg/dL 94 114(H) 125(H)  BUN 6 - 20 mg/dL <5(L) 5(L) <5(L)  Creatinine 0.44 - 1.00 mg/dL 0.45 0.71 0.61  Sodium 135 - 145 mmol/L 140 137 136  Potassium 3.5 - 5.1 mmol/L 2.9(L) 3.3(L) 4.0  Chloride 101 - 111 mmol/L 111 110 104  CO2 22 - 32 mmol/L 23 17(L) 23  Calcium 8.9 - 10.3 mg/dL 7.1(L) 6.6(L) 8.7(L)    7d ago  Specimen Description BLOOD RIGHT ANTECUBITAL   Special Requests BOTTLES DRAWN AEROBIC AND ANAEROBIC 5CC   Culture Setup Time GRAM NEGATIVE RODS  ANAEROBIC BOTTLE ONLY  CRITICAL RESULT CALLED TO, READ BACK BY AND VERIFIED WITH: K.AMMEND,PHARMD [id number] 09/04/16 G.[email or web address]       Culture ESCHERICHIA COLI    Report Status 09/06/2016 FINAL   Organism ID, Bacteria ESCHERICHIA COLI   Resulting Agency [facility]  Susceptibility    Escherichia coli    MIC    AMPICILLIN >=32 RESIST... Resistant    AMPICILLIN/SULBACTAM 16 INTERMED... Intermediate    CEFAZOLIN <=4 SENSITIVE "><=4 SENSITIVE  Sensitive    CEFEPIME <=1 SENSITIVE "><=1 SENSITIVE  Sensitive    CEFTAZIDIME <=1 SENSITIVE "><=1 SENSITIVE  Sensitive    CEFTRIAXONE <=1 SENSITIVE "><=1 SENSITIVE  Sensitive    CIPROFLOXACIN <=0.25 SENSITIVE "><=0.25 SENS... Sensitive    Extended ESBL NEGATIVE  Sensitive    GENTAMICIN <=1 SENSITIVE "><=1 SENSITIVE  Sensitive    IMIPENEM <=0.25 SENSITIVE "><=0.25 SENS... Sensitive    PIP/TAZO <=4 SENSITIVE "><=4 SENSITIVE  Sensitive    TRIMETH/SULFA >=320 RESIS... Resistant      CT renal stone study - 09/03/16 IMPRESSION: Bilateral mild hydroureter with periureteral strandy edema without obstructing ureteral calculus or hydronephrosis. Appearance is compatible with bilateral ascending urinary tract infection  or pyelonephritis.  Negative for nephrolithiasis Hepatomegaly with hepatic steatosis Small hiatal hernia Remote cholecystectomy 5.4 cm right ovarian cyst 3.2 cm left ovarian dermoid, stable    Discharge Instructions: Discharge Instructions    Call MD for:  difficulty breathing, headache or visual disturbances    Complete by:  As directed    Call MD for:  extreme fatigue    Complete by:  As directed    Call MD for:  persistant dizziness or light-headedness    Complete by:  As directed    Call MD for:  persistant nausea and vomiting    Complete by:  As directed    Call MD for:  severe uncontrolled pain    Complete by:  As directed    Call MD for:  temperature >100.4    Complete by:  As directed    Diet - low sodium heart healthy    Complete by:  As directed    Discharge instructions    Complete by:  As directed    Please take the prescribed antibiotic Keflex four times daily for 10  days (starting tomorrow). Please continue to take your other medications as prescribed. Please make sure to keep your follow up appointment with [place] at the internal medicine clinic. If you develop worsening symptoms (return of fevers, pain, nausea/vomiting) please return to the ER.   Increase activity slowly    Complete by:  As directed       Signed: Asencion Partridge, MD 09/05/2016, 3:38 PM   Pager: [phone]

## 2016-09-05 NOTE — Progress Notes (Signed)
Pt still here, waiting for her ride to come pick her up. She states they are coming, time changed. Report given to charge nurse RN, Lurena Joiner that she is waiting for ride arrival and will oversee her care til her ride arrives.

## 2016-09-05 NOTE — Progress Notes (Signed)
Pt d/c instructions reviewed with her and copy given and script given. Pt needed a note for her work, MD was notified and pt provided a note for her work. Pt has called her ride and is waiting for their arrival.

## 2016-09-05 NOTE — Progress Notes (Signed)
Subjective: [name] is feeling good today, sitting in bedside chair. Pain has nearly resolved. Walking without dizziness and eating and drinking as usual. Pressures have normalized overnight, discontinued mIVF. Culture sensitivity have resulted and patient is stable for transition to oral antibiotics and discharge after last dose of Rocephin today.   Objective: Vital signs in last 24 hours: Vitals:   09/04/16 0537 09/04/16 0949 09/04/16 1705 09/04/16 2114  BP: (!) 98/58 (!) 99/57 109/70 133/82  Pulse: 94 88 86 95  Resp: 18 17 14 16   Temp: 97.7 F (36.5 C) 98.5 F (36.9 C) 98.3 F (36.8 C) 98 F (36.7 C)  TempSrc: Oral Oral Oral Oral  SpO2: 99% 100% 99% 99%  Weight:      Height:        Intake/Output Summary (Last 24 hours) at 09/05/16 0755 Last data filed at 09/05/16 0602  Gross per 24 hour  Intake          4340.42 ml  Output             1525 ml  Net          2815.42 ml   Physical Exam General appearance: Well-nourished woman resting in bed, in no distress HENT: Normocephalic, atraumatic, moist MMs Cardiovascular: Regular rate and rhythm, no murmurs, rubs, gallops Respiratory/Chest: Clear to ausculation bilaterally, normal work of breathing Abdomen: Bowel sounds present, soft, non tender, non-distended, mild residual CVA tenderness bilaterally Skin: Warm, dry, intact Neuro: Cranial nerves grossly intact, alert and oriented, strength intact Psych: Positive affect  Labs / Imaging / Procedures: CBC Latest Ref Rng & Units 09/05/2016 09/04/2016 09/03/2016  WBC 4.0 - 10.5 K/uL 7.6 17.5(H) 14.0(H)  Hemoglobin 12.0 - 15.0 g/dL 6.8(LL) 7.2(L) 8.4(L)  Hematocrit 36.0 - 46.0 % 22.3(L) 24.2(L) 28.4(L)  Platelets 150 - 400 K/uL 194 207 262   BMP Latest Ref Rng & Units 09/04/2016 09/03/2016 05/24/2016  Glucose 65 - 99 mg/dL 114(H) 125(H) 133(H)  BUN 6 - 20 mg/dL 5(L) <5(L) <5(L)  Creatinine 0.44 - 1.00 mg/dL 0.71 0.61 0.47  Sodium 135 - 145 mmol/L 137 136 139  Potassium 3.5 - 5.1  mmol/L 3.3(L) 4.0 3.0(L)  Chloride 101 - 111 mmol/L 110 104 107  CO2 22 - 32 mmol/L 17(L) 23 27  Calcium 8.9 - 10.3 mg/dL 6.6(L) 8.7(L) 7.8(L)   Urine culture sensitivity Susceptibility    Escherichia coli    MIC    AMPICILLIN >=32 RESIST... Resistant    AMPICILLIN/SULBACTAM 16 INTERMED... Intermediate    CEFAZOLIN <=4 SENSITIVE "><=4 SENSITIVE  Sensitive    CEFTRIAXONE <=1 SENSITIVE "><=1 SENSITIVE  Sensitive    CIPROFLOXACIN <=0.25 SENSITIVE "><=0.25 SENS... Sensitive    Extended ESBL NEGATIVE  Sensitive    GENTAMICIN <=1 SENSITIVE "><=1 SENSITIVE  Sensitive    IMIPENEM <=0.25 SENSITIVE "><=0.25 SENS... Sensitive    NITROFURANTOIN <=16 SENSITIVE "><=16 SENSIT... Sensitive    PIP/TAZO <=4 SENSITIVE "><=4 SENSITIVE  Sensitive    TRIMETH/SULFA >=320 RESIS... Resistant     Assessment/Plan: Victoria Gordon is a 35 y.o. woman with PMH alcohol abuse, substance abuse, MDD, hemorrhoids, PTSD admitted for sepsis, bilateral pyelonephritis, and now with E. Coli bacteremia.   Sepsis, with E. Coli bacteremia from bilateral pyelo, improving, WBC 17.5 today from 14 but vitals improving and afebrile. Initially was tachycardic/tachypneic with rising lactate and worsening hypotension despite initial 3L bolus, lactate peaked at 4.2 overnight then trended down, received 2-3 additional IVF boluses but today has stabilized on mIVF, blood pressure is  improving 110s/70, tachypnea resolved and remained afebrile since night of admission (Tmax 102.8). Blood culture grew E. Coli within 24 hours and bilateral pyelo vs ascending bilateral UTI revealed on presenting CT study. 11/6 normotensive, good po intake - Discontinue mIVF at 125cc/hr today - Rocephin 2g IV, third dose today then po Cipro regimen for 7-10d - Follow culture sensitivities - Tylenol PRN for fever  Hypokalemia, K 2.9 today, likely secondary to IVF influx - Kdur 75mEq x1 today  Anemia, Hb 6.8 this morning (from 8.4 on arrival), likely  dilutional secondary to massive IVF influx, no blood loss clinically, hemolysis unlikely given Tbili 1.5, platelets stable - Recheck CBC at follow up  Major depression - continue home Zoloft  Left adnexal teratoma, seen on CT A/P on 05/24/2016, 3.2 x 2.8cm - Follow up ultrasound with PCP  Elevated alkaline phosphatase, 246 this admission, also AST 162, history of cholecystectomy - Outpatient workup - GGT, PTH, vitamin D  FEN/GI: Regular diet, replete electrolytes as needed  Dispo: Anticipated discharge in approximately 0-1 day(s).   LOS: 1 day   Asencion Partridge, MD 09/05/2016, 7:55 AM Pager: [phone]

## 2016-09-06 LAB — CULTURE, BLOOD (ROUTINE X 2)

## 2016-09-08 LAB — CULTURE, BLOOD (ROUTINE X 2): CULTURE: NO GROWTH

## 2016-09-13 ENCOUNTER — Telehealth: Payer: Self-pay | Admitting: General Practice

## 2016-09-13 NOTE — Telephone Encounter (Signed)
APT. REMINDER CALL, LMTCB °

## 2016-09-14 ENCOUNTER — Encounter: Payer: Self-pay | Admitting: General Practice

## 2016-09-14 ENCOUNTER — Ambulatory Visit: Payer: Self-pay

## 2016-11-22 ENCOUNTER — Observation Stay
Admission: EM | Admit: 2016-11-22 | Payer: Self-pay | Source: Other Acute Inpatient Hospital | Admitting: Internal Medicine

## 2016-11-23 DIAGNOSIS — D649 Anemia, unspecified: Secondary | ICD-10-CM | POA: Insufficient documentation

## 2016-11-23 DIAGNOSIS — K644 Residual hemorrhoidal skin tags: Secondary | ICD-10-CM | POA: Insufficient documentation

## 2016-11-23 DIAGNOSIS — F32A Depression, unspecified: Secondary | ICD-10-CM | POA: Insufficient documentation

## 2016-11-24 DIAGNOSIS — D500 Iron deficiency anemia secondary to blood loss (chronic): Secondary | ICD-10-CM | POA: Insufficient documentation

## 2016-11-24 DIAGNOSIS — R7989 Other specified abnormal findings of blood chemistry: Secondary | ICD-10-CM | POA: Insufficient documentation

## 2016-11-24 DIAGNOSIS — K648 Other hemorrhoids: Secondary | ICD-10-CM | POA: Insufficient documentation

## 2016-12-23 DIAGNOSIS — K7010 Alcoholic hepatitis without ascites: Secondary | ICD-10-CM | POA: Insufficient documentation

## 2016-12-23 DIAGNOSIS — K649 Unspecified hemorrhoids: Secondary | ICD-10-CM | POA: Insufficient documentation

## 2016-12-25 DIAGNOSIS — R1900 Intra-abdominal and pelvic swelling, mass and lump, unspecified site: Secondary | ICD-10-CM | POA: Insufficient documentation

## 2016-12-29 DIAGNOSIS — Z8719 Personal history of other diseases of the digestive system: Secondary | ICD-10-CM | POA: Insufficient documentation

## 2016-12-29 DIAGNOSIS — Z9889 Other specified postprocedural states: Secondary | ICD-10-CM | POA: Insufficient documentation

## 2017-04-01 DIAGNOSIS — K7011 Alcoholic hepatitis with ascites: Secondary | ICD-10-CM

## 2017-05-08 DIAGNOSIS — K769 Liver disease, unspecified: Secondary | ICD-10-CM | POA: Insufficient documentation

## 2017-09-19 ENCOUNTER — Inpatient Hospital Stay (HOSPITAL_COMMUNITY)
Admission: EM | Admit: 2017-09-19 | Discharge: 2017-09-26 | DRG: 871 | Disposition: A | Discharge status: Home Health | Payer: Self-pay | Attending: Internal Medicine | Admitting: Internal Medicine

## 2017-09-19 ENCOUNTER — Other Ambulatory Visit: Payer: Self-pay

## 2017-09-19 ENCOUNTER — Encounter (HOSPITAL_COMMUNITY): Payer: Self-pay | Admitting: *Deleted

## 2017-09-19 DIAGNOSIS — F10120 Alcohol abuse with intoxication, uncomplicated: Secondary | ICD-10-CM | POA: Diagnosis present

## 2017-09-19 DIAGNOSIS — D638 Anemia in other chronic diseases classified elsewhere: Secondary | ICD-10-CM | POA: Diagnosis present

## 2017-09-19 DIAGNOSIS — E7220 Disorder of urea cycle metabolism, unspecified: Secondary | ICD-10-CM

## 2017-09-19 DIAGNOSIS — R790 Abnormal level of blood mineral: Secondary | ICD-10-CM

## 2017-09-19 DIAGNOSIS — F329 Major depressive disorder, single episode, unspecified: Secondary | ICD-10-CM | POA: Diagnosis present

## 2017-09-19 DIAGNOSIS — D65 Disseminated intravascular coagulation [defibrination syndrome]: Secondary | ICD-10-CM | POA: Diagnosis not present

## 2017-09-19 DIAGNOSIS — W182XXA Fall in (into) shower or empty bathtub, initial encounter: Secondary | ICD-10-CM | POA: Diagnosis present

## 2017-09-19 DIAGNOSIS — F10920 Alcohol use, unspecified with intoxication, uncomplicated: Secondary | ICD-10-CM

## 2017-09-19 DIAGNOSIS — K709 Alcoholic liver disease, unspecified: Secondary | ICD-10-CM

## 2017-09-19 DIAGNOSIS — R7881 Bacteremia: Secondary | ICD-10-CM

## 2017-09-19 DIAGNOSIS — H1132 Conjunctival hemorrhage, left eye: Secondary | ICD-10-CM

## 2017-09-19 DIAGNOSIS — Z79899 Other long term (current) drug therapy: Secondary | ICD-10-CM

## 2017-09-19 DIAGNOSIS — K7290 Hepatic failure, unspecified without coma: Secondary | ICD-10-CM

## 2017-09-19 DIAGNOSIS — K7030 Alcoholic cirrhosis of liver without ascites: Secondary | ICD-10-CM | POA: Diagnosis present

## 2017-09-19 DIAGNOSIS — Y907 Blood alcohol level of 200-239 mg/100 ml: Secondary | ICD-10-CM | POA: Diagnosis present

## 2017-09-19 DIAGNOSIS — K766 Portal hypertension: Secondary | ICD-10-CM | POA: Diagnosis present

## 2017-09-19 DIAGNOSIS — R7989 Other specified abnormal findings of blood chemistry: Secondary | ICD-10-CM | POA: Diagnosis present

## 2017-09-19 DIAGNOSIS — A401 Sepsis due to streptococcus, group B: Principal | ICD-10-CM | POA: Diagnosis present

## 2017-09-19 DIAGNOSIS — E876 Hypokalemia: Secondary | ICD-10-CM | POA: Diagnosis present

## 2017-09-19 DIAGNOSIS — F419 Anxiety disorder, unspecified: Secondary | ICD-10-CM | POA: Diagnosis present

## 2017-09-19 DIAGNOSIS — R1110 Vomiting, unspecified: Secondary | ICD-10-CM

## 2017-09-19 DIAGNOSIS — S301XXA Contusion of abdominal wall, initial encounter: Secondary | ICD-10-CM | POA: Diagnosis present

## 2017-09-19 DIAGNOSIS — W19XXXA Unspecified fall, initial encounter: Secondary | ICD-10-CM | POA: Diagnosis present

## 2017-09-19 DIAGNOSIS — K7581 Nonalcoholic steatohepatitis (NASH): Secondary | ICD-10-CM | POA: Diagnosis present

## 2017-09-19 DIAGNOSIS — K7682 Hepatic encephalopathy: Secondary | ICD-10-CM

## 2017-09-19 DIAGNOSIS — N83201 Unspecified ovarian cyst, right side: Secondary | ICD-10-CM | POA: Diagnosis present

## 2017-09-19 DIAGNOSIS — D696 Thrombocytopenia, unspecified: Secondary | ICD-10-CM

## 2017-09-19 DIAGNOSIS — B1910 Unspecified viral hepatitis B without hepatic coma: Secondary | ICD-10-CM | POA: Diagnosis present

## 2017-09-19 DIAGNOSIS — B951 Streptococcus, group B, as the cause of diseases classified elsewhere: Secondary | ICD-10-CM

## 2017-09-19 DIAGNOSIS — E872 Acidosis, unspecified: Secondary | ICD-10-CM

## 2017-09-19 DIAGNOSIS — T457X5A Adverse effect of anticoagulant antagonists, vitamin K and other coagulants, initial encounter: Secondary | ICD-10-CM | POA: Diagnosis not present

## 2017-09-19 DIAGNOSIS — F4310 Post-traumatic stress disorder, unspecified: Secondary | ICD-10-CM | POA: Diagnosis present

## 2017-09-19 DIAGNOSIS — K760 Fatty (change of) liver, not elsewhere classified: Secondary | ICD-10-CM | POA: Diagnosis present

## 2017-09-19 DIAGNOSIS — R6510 Systemic inflammatory response syndrome (SIRS) of non-infectious origin without acute organ dysfunction: Secondary | ICD-10-CM

## 2017-09-19 DIAGNOSIS — L500 Allergic urticaria: Secondary | ICD-10-CM | POA: Diagnosis not present

## 2017-09-19 DIAGNOSIS — K2920 Alcoholic gastritis without bleeding: Secondary | ICD-10-CM | POA: Diagnosis present

## 2017-09-19 HISTORY — DX: Unspecified viral hepatitis B without hepatic coma: B19.10

## 2017-09-19 HISTORY — DX: Anemia, unspecified: D64.9

## 2017-09-19 NOTE — ED Triage Notes (Signed)
Pt c/op n/v that started today with abd pain, pt also slipped in her bathtub and fell hitting her lower back, pt ha swelling to bilateral eyes as well that started today,

## 2017-09-20 ENCOUNTER — Encounter (HOSPITAL_COMMUNITY): Payer: Self-pay | Admitting: *Deleted

## 2017-09-20 ENCOUNTER — Emergency Department (HOSPITAL_COMMUNITY): Payer: Self-pay

## 2017-09-20 ENCOUNTER — Observation Stay (HOSPITAL_COMMUNITY): Payer: Self-pay

## 2017-09-20 DIAGNOSIS — F4310 Post-traumatic stress disorder, unspecified: Secondary | ICD-10-CM

## 2017-09-20 DIAGNOSIS — K760 Fatty (change of) liver, not elsewhere classified: Secondary | ICD-10-CM

## 2017-09-20 DIAGNOSIS — Y907 Blood alcohol level of 200-239 mg/100 ml: Secondary | ICD-10-CM

## 2017-09-20 DIAGNOSIS — R112 Nausea with vomiting, unspecified: Secondary | ICD-10-CM

## 2017-09-20 DIAGNOSIS — R6510 Systemic inflammatory response syndrome (SIRS) of non-infectious origin without acute organ dysfunction: Secondary | ICD-10-CM

## 2017-09-20 DIAGNOSIS — R7989 Other specified abnormal findings of blood chemistry: Secondary | ICD-10-CM

## 2017-09-20 DIAGNOSIS — R109 Unspecified abdominal pain: Secondary | ICD-10-CM

## 2017-09-20 DIAGNOSIS — S301XXA Contusion of abdominal wall, initial encounter: Secondary | ICD-10-CM

## 2017-09-20 DIAGNOSIS — D696 Thrombocytopenia, unspecified: Secondary | ICD-10-CM

## 2017-09-20 DIAGNOSIS — W19XXXS Unspecified fall, sequela: Secondary | ICD-10-CM

## 2017-09-20 DIAGNOSIS — R790 Abnormal level of blood mineral: Secondary | ICD-10-CM

## 2017-09-20 DIAGNOSIS — F10229 Alcohol dependence with intoxication, unspecified: Secondary | ICD-10-CM

## 2017-09-20 DIAGNOSIS — K709 Alcoholic liver disease, unspecified: Secondary | ICD-10-CM

## 2017-09-20 DIAGNOSIS — E872 Acidosis: Secondary | ICD-10-CM

## 2017-09-20 DIAGNOSIS — F329 Major depressive disorder, single episode, unspecified: Secondary | ICD-10-CM

## 2017-09-20 DIAGNOSIS — W19XXXA Unspecified fall, initial encounter: Secondary | ICD-10-CM | POA: Diagnosis present

## 2017-09-20 DIAGNOSIS — R509 Fever, unspecified: Secondary | ICD-10-CM

## 2017-09-20 DIAGNOSIS — R1110 Vomiting, unspecified: Secondary | ICD-10-CM

## 2017-09-20 LAB — BLOOD CULTURE ID PANEL (REFLEXED)
Acinetobacter baumannii: NOT DETECTED
CANDIDA GLABRATA: NOT DETECTED
CANDIDA KRUSEI: NOT DETECTED
Candida albicans: NOT DETECTED
Candida parapsilosis: NOT DETECTED
Candida tropicalis: NOT DETECTED
ENTEROBACTERIACEAE SPECIES: NOT DETECTED
Enterobacter cloacae complex: NOT DETECTED
Enterococcus species: NOT DETECTED
Escherichia coli: NOT DETECTED
HAEMOPHILUS INFLUENZAE: NOT DETECTED
KLEBSIELLA OXYTOCA: NOT DETECTED
KLEBSIELLA PNEUMONIAE: NOT DETECTED
LISTERIA MONOCYTOGENES: NOT DETECTED
Neisseria meningitidis: NOT DETECTED
PROTEUS SPECIES: NOT DETECTED
PSEUDOMONAS AERUGINOSA: NOT DETECTED
STAPHYLOCOCCUS AUREUS BCID: NOT DETECTED
STREPTOCOCCUS AGALACTIAE: DETECTED — AB
STREPTOCOCCUS PNEUMONIAE: NOT DETECTED
STREPTOCOCCUS PYOGENES: NOT DETECTED
Serratia marcescens: NOT DETECTED
Staphylococcus species: NOT DETECTED
Streptococcus species: DETECTED — AB

## 2017-09-20 LAB — LACTIC ACID, PLASMA
LACTIC ACID, VENOUS: 2.9 mmol/L — AB (ref 0.5–1.9)
Lactic Acid, Venous: 2.8 mmol/L (ref 0.5–1.9)
Lactic Acid, Venous: 2.8 mmol/L (ref 0.5–1.9)

## 2017-09-20 LAB — CBC WITH DIFFERENTIAL/PLATELET
BASOS ABS: 0 10*3/uL (ref 0.0–0.1)
BASOS PCT: 0 %
EOS ABS: 0 10*3/uL (ref 0.0–0.7)
Eosinophils Relative: 0 %
HCT: 29.9 % — ABNORMAL LOW (ref 36.0–46.0)
HEMOGLOBIN: 9.6 g/dL — AB (ref 12.0–15.0)
Lymphocytes Relative: 14 %
Lymphs Abs: 1 10*3/uL (ref 0.7–4.0)
MCH: 26.4 pg (ref 26.0–34.0)
MCHC: 32.1 g/dL (ref 30.0–36.0)
MCV: 82.4 fL (ref 78.0–100.0)
MONOS PCT: 8 %
Monocytes Absolute: 0.5 10*3/uL (ref 0.1–1.0)
NEUTROS ABS: 5.6 10*3/uL (ref 1.7–7.7)
NEUTROS PCT: 78 %
Platelets: 30 10*3/uL — ABNORMAL LOW (ref 150–400)
RBC: 3.63 MIL/uL — AB (ref 3.87–5.11)
RDW: 21 % — AB (ref 11.5–15.5)
WBC: 7.1 10*3/uL (ref 4.0–10.5)

## 2017-09-20 LAB — COMPREHENSIVE METABOLIC PANEL
ALBUMIN: 3.1 g/dL — AB (ref 3.5–5.0)
ALT: 22 U/L (ref 14–54)
ALT: 29 U/L (ref 14–54)
ANION GAP: 8 (ref 5–15)
ANION GAP: 9 (ref 5–15)
AST: 120 U/L — AB (ref 15–41)
AST: 93 U/L — ABNORMAL HIGH (ref 15–41)
Albumin: 3.7 g/dL (ref 3.5–5.0)
Alkaline Phosphatase: 103 U/L (ref 38–126)
Alkaline Phosphatase: 92 U/L (ref 38–126)
BUN: 5 mg/dL — ABNORMAL LOW (ref 6–20)
BUN: <5 mg/dL — ABNORMAL LOW (ref 6–20)
CALCIUM: 7.3 mg/dL — AB (ref 8.9–10.3)
CHLORIDE: 104 mmol/L (ref 101–111)
CHLORIDE: 104 mmol/L (ref 101–111)
CO2: 21 mmol/L — ABNORMAL LOW (ref 22–32)
CO2: 25 mmol/L (ref 22–32)
CREATININE: 0.3 mg/dL — AB (ref 0.44–1.00)
Calcium: 8.1 mg/dL — ABNORMAL LOW (ref 8.9–10.3)
Creatinine, Ser: 0.33 mg/dL — ABNORMAL LOW (ref 0.44–1.00)
GFR calc Af Amer: >60 mL/min (ref >60)
Glucose, Bld: 110 mg/dL — ABNORMAL HIGH (ref 65–99)
Glucose, Bld: 115 mg/dL — ABNORMAL HIGH (ref 65–99)
POTASSIUM: 3.3 mmol/L — AB (ref 3.5–5.1)
Potassium: 3.1 mmol/L — ABNORMAL LOW (ref 3.5–5.1)
SODIUM: 134 mmol/L — AB (ref 135–145)
Sodium: 137 mmol/L (ref 135–145)
TOTAL PROTEIN: 7.7 g/dL (ref 6.5–8.1)
Total Bilirubin: 5.1 mg/dL — ABNORMAL HIGH (ref 0.3–1.2)
Total Bilirubin: 5.6 mg/dL — ABNORMAL HIGH (ref 0.3–1.2)
Total Protein: 6.5 g/dL (ref 6.5–8.1)

## 2017-09-20 LAB — FOLATE: FOLATE: 5.1 ng/mL — AB (ref >5.9)

## 2017-09-20 LAB — URINALYSIS, ROUTINE W REFLEX MICROSCOPIC
BACTERIA UA: NONE SEEN
Bilirubin Urine: NEGATIVE
Glucose, UA: NEGATIVE mg/dL
Ketones, ur: NEGATIVE mg/dL
Leukocytes, UA: NEGATIVE
Nitrite: NEGATIVE
PROTEIN: 30 mg/dL — AB
Specific Gravity, Urine: 1.017 (ref 1.005–1.030)
pH: 6 (ref 5.0–8.0)

## 2017-09-20 LAB — CBC
HCT: 25.2 % — ABNORMAL LOW (ref 36.0–46.0)
HEMOGLOBIN: 8.1 g/dL — AB (ref 12.0–15.0)
MCH: 26.6 pg (ref 26.0–34.0)
MCHC: 32.1 g/dL (ref 30.0–36.0)
MCV: 82.9 fL (ref 78.0–100.0)
PLATELETS: 16 10*3/uL — AB (ref 150–400)
RBC: 3.04 MIL/uL — AB (ref 3.87–5.11)
RDW: 21.3 % — ABNORMAL HIGH (ref 11.5–15.5)
WBC: 5 10*3/uL (ref 4.0–10.5)

## 2017-09-20 LAB — FERRITIN: FERRITIN: 24 ng/mL (ref 11–307)

## 2017-09-20 LAB — INFLUENZA PANEL BY PCR (TYPE A & B)
INFLBPCR: NEGATIVE
Influenza A By PCR: NEGATIVE

## 2017-09-20 LAB — RAPID URINE DRUG SCREEN, HOSP PERFORMED
AMPHETAMINES: NOT DETECTED
BENZODIAZEPINES: POSITIVE — AB
Barbiturates: NOT DETECTED
COCAINE: NOT DETECTED
OPIATES: NOT DETECTED
TETRAHYDROCANNABINOL: NOT DETECTED

## 2017-09-20 LAB — LACTATE DEHYDROGENASE: LDH: 180 U/L (ref 98–192)

## 2017-09-20 LAB — CK: CK TOTAL: 157 U/L (ref 38–234)

## 2017-09-20 LAB — T4, FREE: Free T4: 0.77 ng/dL (ref 0.61–1.12)

## 2017-09-20 LAB — FIBRINOGEN: Fibrinogen: 183 mg/dL — ABNORMAL LOW (ref 210–475)

## 2017-09-20 LAB — DIRECT ANTIGLOBULIN TEST (NOT AT ARMC)
DAT, COMPLEMENT: NEGATIVE
DAT, IGG: NEGATIVE

## 2017-09-20 LAB — MAGNESIUM: MAGNESIUM: 1.5 mg/dL — AB (ref 1.7–2.4)

## 2017-09-20 LAB — APTT: APTT: 51 s — AB (ref 24–36)

## 2017-09-20 LAB — LIPASE, BLOOD: LIPASE: 28 U/L (ref 11–51)

## 2017-09-20 LAB — ETHANOL: Alcohol, Ethyl (B): 222 mg/dL — ABNORMAL HIGH (ref <10)

## 2017-09-20 LAB — TSH: TSH: 0.299 u[IU]/mL — ABNORMAL LOW (ref 0.350–4.500)

## 2017-09-20 LAB — PROTIME-INR
INR: 1.85
Prothrombin Time: 21.2 seconds — ABNORMAL HIGH (ref 11.4–15.2)

## 2017-09-20 LAB — VITAMIN B12: VITAMIN B 12: 924 pg/mL — AB (ref 180–914)

## 2017-09-20 LAB — AMMONIA: Ammonia: 51 umol/L — ABNORMAL HIGH (ref 9–35)

## 2017-09-20 MED ORDER — MAGNESIUM SULFATE 2 GM/50ML IV SOLN
2.0000 g | Freq: Once | INTRAVENOUS | Status: AC
  Administered 2017-09-20: 2 g via INTRAVENOUS
  Filled 2017-09-20: qty 50

## 2017-09-20 MED ORDER — ONDANSETRON HCL 4 MG PO TABS: 4.0000 mg | ORAL_TABLET | Freq: Four times a day (QID) | ORAL | Status: DC | PRN

## 2017-09-20 MED ORDER — VANCOMYCIN HCL IN DEXTROSE 1-5 GM/200ML-% IV SOLN
1000.0000 mg | Freq: Once | INTRAVENOUS | Status: AC
  Administered 2017-09-20: 1000 mg via INTRAVENOUS
  Filled 2017-09-20: qty 200

## 2017-09-20 MED ORDER — THIAMINE HCL 100 MG/ML IJ SOLN: 100.0000 mg | Freq: Every day | INTRAMUSCULAR | Status: DC

## 2017-09-20 MED ORDER — SODIUM CHLORIDE 0.9 % IV SOLN
6.0000 mg/kg | INTRAVENOUS | Status: DC
  Administered 2017-09-20: 420.5 mg via INTRAVENOUS
  Filled 2017-09-20 (×3): qty 8.41

## 2017-09-20 MED ORDER — DEXTROSE 5 % IV SOLN
2.0000 g | INTRAVENOUS | Status: DC
  Administered 2017-09-20 – 2017-09-24 (×5): 2 g via INTRAVENOUS
  Filled 2017-09-20 (×8): qty 2

## 2017-09-20 MED ORDER — PRO-STAT SUGAR FREE PO LIQD
30.0000 mL | Freq: Three times a day (TID) | ORAL | Status: DC
  Administered 2017-09-20 – 2017-09-21 (×3): 30 mL via ORAL
  Filled 2017-09-20 (×2): qty 30

## 2017-09-20 MED ORDER — SODIUM CHLORIDE 0.9 % IV SOLN
INTRAVENOUS | Status: DC
  Administered 2017-09-20: 09:00:00 via INTRAVENOUS

## 2017-09-20 MED ORDER — ONDANSETRON HCL 4 MG/2ML IJ SOLN
4.0000 mg | Freq: Once | INTRAMUSCULAR | Status: AC
  Administered 2017-09-20: 4 mg via INTRAVENOUS
  Filled 2017-09-20: qty 2

## 2017-09-20 MED ORDER — FOLIC ACID 1 MG PO TABS
1.0000 mg | ORAL_TABLET | Freq: Every day | ORAL | Status: DC
  Administered 2017-09-20 – 2017-09-26 (×7): 1 mg via ORAL
  Filled 2017-09-20 (×7): qty 1

## 2017-09-20 MED ORDER — PIPERACILLIN-TAZOBACTAM 3.375 G IVPB
3.3750 g | Freq: Three times a day (TID) | INTRAVENOUS | Status: DC
  Administered 2017-09-20: 3.375 g via INTRAVENOUS
  Filled 2017-09-20: qty 50

## 2017-09-20 MED ORDER — ENSURE ENLIVE PO LIQD
237.0000 mL | Freq: Two times a day (BID) | ORAL | Status: DC
  Administered 2017-09-22 – 2017-09-25 (×8): 237 mL via ORAL

## 2017-09-20 MED ORDER — LORAZEPAM 1 MG PO TABS: 1.0000 mg | ORAL_TABLET | Freq: Four times a day (QID) | ORAL | Status: AC | PRN

## 2017-09-20 MED ORDER — OXYCODONE HCL 5 MG PO TABS
5.0000 mg | ORAL_TABLET | ORAL | Status: DC | PRN
  Administered 2017-09-20: 5 mg via ORAL
  Filled 2017-09-20: qty 1

## 2017-09-20 MED ORDER — SODIUM CHLORIDE 0.9 % IV BOLUS (SEPSIS)
1000.0000 mL | Freq: Once | INTRAVENOUS | Status: AC
  Administered 2017-09-20: 1000 mL via INTRAVENOUS

## 2017-09-20 MED ORDER — IOPAMIDOL (ISOVUE-300) INJECTION 61%
100.0000 mL | Freq: Once | INTRAVENOUS | Status: AC | PRN
  Administered 2017-09-20: 100 mL via INTRAVENOUS

## 2017-09-20 MED ORDER — MAGNESIUM SULFATE 4 GM/100ML IV SOLN
4.0000 g | Freq: Once | INTRAVENOUS | Status: AC
  Administered 2017-09-20: 4 g via INTRAVENOUS
  Filled 2017-09-20: qty 100

## 2017-09-20 MED ORDER — DIPHENHYDRAMINE HCL 50 MG/ML IJ SOLN
25.0000 mg | Freq: Once | INTRAMUSCULAR | Status: AC
  Administered 2017-09-20: 25 mg via INTRAVENOUS
  Filled 2017-09-20: qty 1

## 2017-09-20 MED ORDER — PIPERACILLIN-TAZOBACTAM 3.375 G IVPB 30 MIN
3.3750 g | Freq: Three times a day (TID) | INTRAVENOUS | Status: DC
Start: 2017-09-20 — End: 2017-09-20

## 2017-09-20 MED ORDER — LORAZEPAM 2 MG/ML IJ SOLN: 1.0000 mg | Freq: Four times a day (QID) | INTRAMUSCULAR | Status: AC | PRN

## 2017-09-20 MED ORDER — POTASSIUM CHLORIDE IN NACL 20-0.9 MEQ/L-% IV SOLN
INTRAVENOUS | Status: DC
Start: 2017-09-20 — End: 2017-09-22
  Administered 2017-09-20 (×2): via INTRAVENOUS
  Administered 2017-09-21: 100 mL/h via INTRAVENOUS
  Administered 2017-09-21 – 2017-09-22 (×2): via INTRAVENOUS

## 2017-09-20 MED ORDER — PIPERACILLIN-TAZOBACTAM 3.375 G IVPB: 3.3750 g | Freq: Three times a day (TID) | INTRAVENOUS | Status: DC

## 2017-09-20 MED ORDER — ONDANSETRON HCL 4 MG/2ML IJ SOLN: 4.0000 mg | Freq: Four times a day (QID) | INTRAMUSCULAR | Status: DC | PRN

## 2017-09-20 MED ORDER — SERTRALINE HCL 50 MG PO TABS
100.0000 mg | ORAL_TABLET | Freq: Every day | ORAL | Status: DC
  Administered 2017-09-20 – 2017-09-26 (×7): 100 mg via ORAL
  Filled 2017-09-20 (×7): qty 2

## 2017-09-20 MED ORDER — PIPERACILLIN-TAZOBACTAM 3.375 G IVPB 30 MIN
3.3750 g | Freq: Once | INTRAVENOUS | Status: AC
  Administered 2017-09-20: 3.375 g via INTRAVENOUS
  Filled 2017-09-20: qty 50

## 2017-09-20 MED ORDER — METHYLPREDNISOLONE SODIUM SUCC 125 MG IJ SOLR
125.0000 mg | Freq: Once | INTRAMUSCULAR | Status: AC
  Administered 2017-09-20: 125 mg via INTRAVENOUS
  Filled 2017-09-20: qty 2

## 2017-09-20 MED ORDER — PANTOPRAZOLE SODIUM 40 MG IV SOLR
40.0000 mg | Freq: Two times a day (BID) | INTRAVENOUS | Status: DC
  Administered 2017-09-20 – 2017-09-23 (×7): 40 mg via INTRAVENOUS
  Filled 2017-09-20 (×7): qty 40

## 2017-09-20 MED ORDER — BOOST / RESOURCE BREEZE PO LIQD
1.0000 | Freq: Three times a day (TID) | ORAL | Status: DC
  Administered 2017-09-20 – 2017-09-21 (×3): 1 via ORAL

## 2017-09-20 MED ORDER — POTASSIUM CHLORIDE CRYS ER 20 MEQ PO TBCR
40.0000 meq | EXTENDED_RELEASE_TABLET | Freq: Once | ORAL | Status: AC
Start: 2017-09-20 — End: 2017-09-20
  Administered 2017-09-20: 40 meq via ORAL
  Filled 2017-09-20: qty 2

## 2017-09-20 MED ORDER — ADULT MULTIVITAMIN W/MINERALS CH
1.0000 | ORAL_TABLET | Freq: Every day | ORAL | Status: DC
  Administered 2017-09-20 – 2017-09-26 (×7): 1 via ORAL
  Filled 2017-09-20 (×7): qty 1

## 2017-09-20 MED ORDER — VITAMIN B-1 100 MG PO TABS
100.0000 mg | ORAL_TABLET | Freq: Every day | ORAL | Status: DC
  Administered 2017-09-20 – 2017-09-26 (×7): 100 mg via ORAL
  Filled 2017-09-20 (×7): qty 1

## 2017-09-20 NOTE — H&P (Signed)
TRH H&P    Patient Demographics:    Victoria Gordon, is a 36 y.o. female  MRN: [id number]  DOB - 1981-10-31  Admit Date - 09/19/2017  Referring MD/NP/PA: Dr. Tomi Bamberger  Outpatient Primary MD for the patient is Patient, No Pcp Per  Patient coming from: Home  Chief Complaint  Patient presents with  . Emesis      HPI:    Victoria Gordon  is a 36 y.o. female,.. With history of liver cirrhosis, alcohol abuse, hepatitis B, substance abuse came to hospital after a fall.  Patient says that on November 18 she was walking in the dark and she tripped and fell over a speaker in her house.  She sustained a large bruise in the right lateral lower abdomen.  On November 19 she fell in the tub in the evening at that time she fell back and hit her back of the head no loss of consciousness.  Patient does drink alcohol and usually drinks once a week.  She drank alcohol today with alcohol level 222.  In the ED imaging studies CT head, CT cervical spine, CT abdomen pelvis with contrast showed no acute abnormality. Patient's lab work revealed magnesium 1.5, potassium 3.3, lactic acid 2.8, platelet count 30,000.  Patient has liver cirrhosis and her platelet count usually runs around 100,000.  Ammonia level 51  Patient was empirically started on vancomycin and Zosyn for possible sepsis. UA is clear, chest x-ray showed no pneumonia. No apparent clear source of infection. Patient denies chest pain, no shortness of breath. Complains of cough. No nausea vomiting or diarrhea. No dysuria urgency or frequency of urination.     Review of systems:     All other systems reviewed and are negative.   With Past History of the following :    Past Medical History:  Diagnosis Date  . Alcohol abuse   . Anemia   . Anxiety   . Hemorrhoid   . Hepatitis B   . Major depressive disorder   . PTSD (post-traumatic stress disorder)    . Substance abuse [facility])       Past Surgical History:  Procedure Laterality Date  . CESAREAN SECTION    . CHOLECYSTECTOMY    . MOUTH SURGERY    . TUBAL LIGATION Bilateral 07/22/2014   Procedure: POST PARTUM TUBAL LIGATION;  Surgeon: Woodroe Mode, MD;  Location: [facility];  Service: Gynecology;  Laterality: Bilateral;      Social History:      Social History   Tobacco Use  . Smoking status: Never Smoker  . Smokeless tobacco: Never Used  Substance Use Topics  . Alcohol use: Yes    Comment: last drink was this am, drinks occassionaly       Family History :     Family History  Problem Relation Age of Onset  . Diabetes Maternal Grandmother   . Diabetes Paternal Grandfather       Home Medications:   Prior to Admission medications   Medication Sig Start Date End Date Taking? Authorizing  Provider  acetaminophen (TYLENOL) 500 MG tablet Take 1,000 mg by mouth every 6 (six) hours as needed for moderate pain or headache.    [provider]  sertraline (ZOLOFT) 100 MG tablet Take 1 tablet (100 mg total) by mouth daily. For depression 02/05/16   Lindell Spar I, NP  traZODone (DESYREL) 150 MG tablet Take 1 tablet (150 mg total) by mouth at bedtime. For insomnia Patient not taking: Reported on 09/03/2016 02/05/16   Lindell Spar I, NP     Allergies:    No Known Allergies   Physical Exam:   Vitals  Blood pressure (!) 132/92, pulse 96, temperature 99.7 F (37.6 C), temperature source Oral, resp. rate 19, height 5\' 1"  (1.549 m), weight 68 kg (150 lb), SpO2 97 %.  1.  General: Appears in no acute distress  2. Psychiatric:  Intact judgement and  insight, awake alert, oriented x 3.  3. Neurologic: No focal neurological deficits, all cranial nerves intact.Strength 5/5 all 4 extremities, sensation intact all 4 extremities, plantars down going.  4. Eyes :  anicteric sclerae, moist conjunctivae with no lid lag. PERRLA.  5. ENMT:  Oropharynx clear with moist mucous  membranes and good dentition  6. Neck:  supple, no cervical lymphadenopathy appriciated, No thyromegaly  7. Respiratory : Normal respiratory effort, good air movement bilaterally,clear to  auscultation bilaterally  8. Cardiovascular : RRR, no gallops, rubs or murmurs, no leg edema  9. Gastrointestinal:  Positive bowel sounds, abdomen soft, non-tender to palpation,no hepatosplenomegaly, no rigidity or guarding       10. Skin:  Large ecchymosis noted in the right lower lateral abdominal wall  11.Musculoskeletal:  Good muscle tone,  joints appear normal , no effusions,  normal range of motion    Data Review:    CBC Recent Labs  Lab 09/20/17 0005  WBC 7.1  HGB 9.6*  HCT 29.9*  PLT 30*  MCV 82.4  MCH 26.4  MCHC 32.1  RDW 21.0*  LYMPHSABS 1.0  MONOABS 0.5  EOSABS 0.0  BASOSABS 0.0   ------------------------------------------------------------------------------------------------------------------  Chemistries  Recent Labs  Lab 09/20/17 0005  NA 137  K 3.3*  CL 104  CO2 25  GLUCOSE 110*  BUN 5*  CREATININE 0.33*  CALCIUM 8.1*  MG 1.5*  AST 120*  ALT 29  ALKPHOS 103  BILITOT 5.6*   ------------------------------------------------------------------------------------------------------------------  ------------------------------------------------------------------------------------------------------------------ GFR: Estimated Creatinine Clearance: 87.4 mL/min (A) (by C-G formula based on SCr of 0.33 mg/dL (L)). Liver Function Tests: Recent Labs  Lab 09/20/17 0005  AST 120*  ALT 29  ALKPHOS 103  BILITOT 5.6*  PROT 7.7  ALBUMIN 3.7   Recent Labs  Lab 09/20/17 0005  LIPASE 28   Recent Labs  Lab 09/20/17 0005  AMMONIA 51*   Coagulation Profile: Recent Labs  Lab 09/20/17 0005  INR 1.85    --------------------------------------------------------------------------------------------------------------- Urine analysis:    Component Value  Date/Time   COLORURINE AMBER (A) 09/20/2017 0001   APPEARANCEUR HAZY (A) 09/20/2017 0001   LABSPEC 1.017 09/20/2017 0001   PHURINE 6.0 09/20/2017 0001   GLUCOSEU NEGATIVE 09/20/2017 0001   HGBUR SMALL (A) 09/20/2017 0001   BILIRUBINUR NEGATIVE 09/20/2017 0001   KETONESUR NEGATIVE 09/20/2017 0001   PROTEINUR 30 (A) 09/20/2017 0001   UROBILINOGEN 0.2 02/17/2014 1509   NITRITE NEGATIVE 09/20/2017 0001   LEUKOCYTESUR NEGATIVE 09/20/2017 0001      Imaging Results:    Dg Chest 2 View  Result Date: 09/20/2017 CLINICAL DATA:  Shortness of breath and  cough since yesterday. EXAM: CHEST  2 VIEW COMPARISON:  06/09/2004 FINDINGS: The heart size and mediastinal contours are within normal limits. Both lungs are clear. The visualized skeletal structures are unremarkable. Surgical clips in the right upper quadrant. IMPRESSION: No active cardiopulmonary disease. Electronically Signed   By: Lucienne Capers M.D.   On: 09/20/2017 02:39   Ct Head Wo Contrast  Result Date: 09/20/2017 CLINICAL DATA:  36 year old female with head trauma. EXAM: CT HEAD WITHOUT CONTRAST CT CERVICAL SPINE WITHOUT CONTRAST TECHNIQUE: Multidetector CT imaging of the head and cervical spine was performed following the standard protocol without intravenous contrast. Multiplanar CT image reconstructions of the cervical spine were also generated. COMPARISON:  Head CT dated 02/07/2016 FINDINGS: CT HEAD FINDINGS Brain: No evidence of acute infarction, hemorrhage, hydrocephalus, extra-axial collection or mass lesion/mass effect. Vascular: No hyperdense vessel or unexpected calcification. Skull: Normal. Negative for fracture or focal lesion. Sinuses/Orbits: Mild mucoperiosteal thickening of paranasal sinuses. No air-fluid level. The mastoid air cells are clear. Other: None CT CERVICAL SPINE FINDINGS Alignment: No acute subluxation. Mild reversal of normal cervical lordosis which may be positional or due to muscle spasm. Skull base and  vertebrae: No acute fracture. No primary bone lesion or focal pathologic process. Soft tissues and spinal canal: No prevertebral fluid or swelling. No visible canal hematoma. Disc levels:  Mild degenerative changes and disc bulge at C5-C6. Upper chest: Negative. Other: None IMPRESSION: 1. Unremarkable noncontrast CT of the brain. 2. No acute/traumatic cervical spine pathology. Electronically Signed   By: Anner Crete M.D.   On: 09/20/2017 01:23   Ct Cervical Spine Wo Contrast  Result Date: 09/20/2017 CLINICAL DATA:  36 year old female with head trauma. EXAM: CT HEAD WITHOUT CONTRAST CT CERVICAL SPINE WITHOUT CONTRAST TECHNIQUE: Multidetector CT imaging of the head and cervical spine was performed following the standard protocol without intravenous contrast. Multiplanar CT image reconstructions of the cervical spine were also generated. COMPARISON:  Head CT dated 02/07/2016 FINDINGS: CT HEAD FINDINGS Brain: No evidence of acute infarction, hemorrhage, hydrocephalus, extra-axial collection or mass lesion/mass effect. Vascular: No hyperdense vessel or unexpected calcification. Skull: Normal. Negative for fracture or focal lesion. Sinuses/Orbits: Mild mucoperiosteal thickening of paranasal sinuses. No air-fluid level. The mastoid air cells are clear. Other: None CT CERVICAL SPINE FINDINGS Alignment: No acute subluxation. Mild reversal of normal cervical lordosis which may be positional or due to muscle spasm. Skull base and vertebrae: No acute fracture. No primary bone lesion or focal pathologic process. Soft tissues and spinal canal: No prevertebral fluid or swelling. No visible canal hematoma. Disc levels:  Mild degenerative changes and disc bulge at C5-C6. Upper chest: Negative. Other: None IMPRESSION: 1. Unremarkable noncontrast CT of the brain. 2. No acute/traumatic cervical spine pathology. Electronically Signed   By: Anner Crete M.D.   On: 09/20/2017 01:23   Ct Abdomen Pelvis W Contrast  Result  Date: 09/20/2017 CLINICAL DATA:  Nausea and vomiting starting today. Abdominal pain. Slip and fall injury striking the lower back. EXAM: CT ABDOMEN AND PELVIS WITH CONTRAST TECHNIQUE: Multidetector CT imaging of the abdomen and pelvis was performed using the standard protocol following bolus administration of intravenous contrast. CONTRAST:  133mL ISOVUE-300 IOPAMIDOL (ISOVUE-300) INJECTION 61% COMPARISON:  11/22/2016 FINDINGS: Lower chest: Lung bases are clear. Hepatobiliary: No focal liver abnormality is seen. Status post cholecystectomy. No biliary dilatation. Pancreas: Unremarkable. No pancreatic ductal dilatation or surrounding inflammatory changes. Spleen: Normal in size without focal abnormality. Adrenals/Urinary Tract: Adrenal glands are unremarkable. Kidneys are normal, without renal calculi,  focal lesion, or hydronephrosis. Bladder is unremarkable. Stomach/Bowel: Stomach is within normal limits. Appendix appears normal. No evidence of bowel wall thickening, distention, or inflammatory changes. Vascular/Lymphatic: No significant vascular findings are present. No enlarged abdominal or pelvic lymph nodes. Reproductive: Uterus is anteverted and is not enlarged. Simple appearing cyst on the right ovary measuring 3.3 cm diameter. Decreasing in size since previous study. Dermoid cyst in the left ovary measuring 3.4 cm diameter without change. Other: No free air or free fluid in the abdomen. Musculoskeletal: Infiltration in the subcutaneous fat lateral to the right pelvis consistent with soft tissue contusion. No loculated collections. No acute fractures identified. IMPRESSION: 1. Soft tissue infiltration consistent with contusion lateral to the right pelvis. 2. Simple appearing right ovarian cyst without change since previous study. Dermoid cyst in the left ovary without change. 3. No evidence of bowel obstruction or inflammation. Electronically Signed   By: Lucienne Capers M.D.   On: 09/20/2017 01:20    My  personal review of EKG: Rhythm NSR   Assessment & Plan:    Active Problems:   Fall   1. Fall-likely due to alcohol abuse, no history of syncope.  Once patient is stable consider PT /OT evaluation. 2. Lactic acidosis-patient lactate mildly elevated 2.8, no clear source of infection.  Patient empirically started on vancomycin and Zosyn.  Consider stopping antibiotics if cultures remain negative and lactic acidosis improves with IV fluids. 3. Thrombocytopenia-secondary to liver cirrhosis, platelet count is down to 30,000 caused ecchymosis in the right lower abdominal wall.  Monitor in the hospital for bleeding.  CT abdomen pelvis is negative for acute bleed 4. Liver cirrhosis-AST 120, ALT 49, total bili 5.6.  Levels currently at baseline.  Ammonia level 51.  Follow LFTs in the hospital 5. Hypomagnesemia-magnesium 1.5, will replace magnesium, likely from alcohol abuse, liver cirrhosis, poor nutrition. 6. Hypokalemia-replace potassium and check BMP in a.m. 7. Anemia of chronic disease-secondary to liver cirrhosis, hemoglobin is currently at baseline. 8. Alcohol abuse-alcohol level is 222, will start CIWA protocol.   DVT Prophylaxis-   SCD's  AM Labs Ordered, also please review Full Orders  Family Communication: Admission, patients condition and plan of care including tests being ordered have been discussed with the patient and who indicate understanding and agree with the plan and Code Status.  Code Status: Full code  Admission status: Observation  Time spent in minutes : 60 minutes   Oswald Hillock M.D on 09/20/2017 at 4:17 AM  Between 7am to 7pm - Pager - [phone]. After 7pm go to [email or web address] - password [facility]  [facility] - Office  [phone]

## 2017-09-20 NOTE — ED Notes (Addendum)
Pt reports no difficulty breathing or swallowing and decreased itching, airway WNL, pt off unit with RN for transfer at this time

## 2017-09-20 NOTE — ED Notes (Signed)
This RN entered room, Vancomycin stopped and pt assessed, airway WNL, pt reports no difficulty breathing or swallowing, pt has large hives along left arm, and abdomen

## 2017-09-20 NOTE — ED Notes (Signed)
Report to 300

## 2017-09-20 NOTE — ED Notes (Signed)
T/c from hospitalist, pt condition reported, orders rcvd and entered

## 2017-09-20 NOTE — ED Notes (Signed)
Date and time results received: 09/20/17 0046   Test: Lactic Critical Value: 2.8  Name of Provider Notified: Dr. Acquanetta Belling  Orders Received? Or Actions Taken?: N/A

## 2017-09-20 NOTE — ED Notes (Signed)
Pt up to BR no assistance needed

## 2017-09-20 NOTE — ED Notes (Signed)
Hospitalist paged

## 2017-09-20 NOTE — Progress Notes (Signed)
Initial Nutrition Assessment  DOCUMENTATION CODES:  Not applicable  INTERVENTION:  Continue Boost Breeze po TID, each supplement provides 250 kcal and 9 grams of protein  While on Clear Liquid restriction Will order 30 mL Prostat TID, each supplement provides 100 kcal and 15 grams of protein.  Once diet advanced, D/C prostat and add Ensure Enlive po BID, each supplement provides 350 kcal and 20 grams of protein  NUTRITION DIAGNOSIS:  Inadequate oral intake related to nausea, vomiting, poor appetite, social / environmental circumstances(ETOH abuse) as evidenced by Pt report of either only eating 1x per day or not at all.  GOAL:  Patient will meet greater than or equal to 90% of their needs  MONITOR:  PO intake, Supplement acceptance, Diet advancement, Labs, Weight trends, I & O's  REASON FOR ASSESSMENT:  Malnutrition Screening Tool    ASSESSMENT:  36 y/o female PMHx PTSD, De[ression, Hep B, Etoh Abuse w/ related cirrhosis and substance abuse. Had become sober in May of 2018, but recently began drinking again. Presented with 1-2 days of n/v and after falling multiple times at home. Admitted for workup of falls.   Patient states she has had no appetite at home. She says that she usually either only eats 1 meal or doesn't eat at all. She says this loss of appetite is chronic. It began after she was hospitalized in July. She takes a list of vitamins that [facility] had instructed, unsure what they are. She denies having any constipation/diarrhea.   She says her UBW is 150 lbs. Per review of charted weights, she is down a large amount in the past year. She was ~190 lbs at this time last year. She has lost 35 lbs since then. This is a loss of 19% BW, which is just shy of clinical significance.   Short term, however, she has GAINED weight. She was consistently weighed <150 lbs this past summer. These weights were retrieved via Care Everywhere (see below). In July, She was 134 lbs prior to the  aforementioned hospitalization. She has history of ascites and her weight gain may be related to re accumulation of fluid as her nutritional status has worsened with poor po intake in setting of relapse  At this time, n/v is improved. She said she tolerated the clear supplements. She is willing to try Full liquids when allowed. She is agreeable to supplements.   NFPE: WDL  Labs: Albumin: 3.1, K:3.1, Na: 134, Folate Low, h/h 8.1/25.2 Meds: Folate, Boost Breeze, PPI, MVI w/ Min, iv ABX, ivf,  Thiamine  Recent Labs  Lab 09/20/17 0005 09/20/17 0742  NA 137 134*  K 3.3* 3.1*  CL 104 104  CO2 25 21*  BUN 5* <5*  CREATININE 0.33* 0.30*  CALCIUM 8.1* 7.3*  MG 1.5*  --   GLUCOSE 110* 115*   NUTRITION - FOCUSED PHYSICAL EXAM: WDL  Diet Order:  Diet clear liquid Room service appropriate? Yes; Fluid consistency: Thin  EDUCATION NEEDS:  No education needs have been identified at this time  Skin:  Skin Assessment: Reviewed RN Assessment  Last BM:  11/20  Height:  Ht Readings from Last 1 Encounters:  09/20/17 5\' 1"  (1.549 m)   Weight:  Wt Readings from Last 1 Encounters:  09/20/17 154 lb 8.7 oz (70.1 kg)   Wt Readings from Last 10 Encounters:  09/20/17 154 lb 8.7 oz (70.1 kg)  09/03/16 200 lb 13.4 oz (91.1 kg)  05/24/16 198 lb (89.8 kg)  02/07/16 198 lb (89.8 kg)  01/29/16 198 lb 6.6 oz (90 kg)  01/28/16 200 lb (90.7 kg)  11/02/14 207 lb 9.6 oz (94.2 kg)  08/27/14 206 lb (93.4 kg)  07/22/14 218 lb (98.9 kg)  07/16/14 218 lb (98.9 kg)  Additional weights via Care Everywhere.  07/19/17:149.5 lbs 06/14/17:148 lbs 05/29/17:142 lbs 05/08/17:134 lbs (just prior to hospitalization for ABLA) 04/01/17:148 lbs(hospitalization for hepatitis w/ ascites) 12/31/16:172 lbs 11/23/16:180 lbs  Ideal Body Weight:  47.73 kg  BMI:  Body mass index is 29.2 kg/m.  Estimated Nutritional Needs:  Kcal:  1800-2000 Kcals (HBEx 1.2-1.4) Protein:  75-90 g Pro Fluid:  1.8- 2 L  FLUID  Burtis Junes RD, LDN, CNSC Clinical Nutrition Pager: [phone] 09/20/2017 4:51 PM

## 2017-09-20 NOTE — Progress Notes (Signed)
PROGRESS NOTE  Victoria Gordon WUJ:[id number] DOB: [birth year] DOA: 09/19/2017 PCP: Patient, No Pcp Per  Brief History:  36 year old female with a history of alcoholic abuse with alcoholic liver injury, PTSD, depression presenting with 2-3-day history of right-sided abdominal pain and one day history of nausea and vomiting.  The patient had previously drank on a daily basis for over 2 years, but reportedly stopped drinking on Mar 14, 2017.  Since then, she has restarted it.  She states that she only drinks 1 sixpack of beer per week.  She could not tell me how long she has been drinking although it was previously documented from her previous records that she drank over 2 years.  The patient had a mechanical fall on September 17, 2017 tripping over a speaker and falling onto her right side.  She had another fall on September 18, 2017 falling in the bathtub.  She denied any loss of consciousness.  She had complained of subjective fevers and chills at home.  She denied any headache, chest pain, shortness breath, cough, hemoptysis, hematemesis, hematochezia, melena.  Alcohol level in the emergency department was 222.  Since admission, the patient began having fevers up to 101.3 F.  Assessment/Plan: SIRS -Etiology unclear -Start Zosyn -Start Cubicin as patient developed hives to vancomycin -Urinalysis negative for pyuria -Chest x-ray negative for infiltrates -Follow blood cultures -Continue IV fluids -influenza pcr  Intractable vomiting -Likely degree of alcoholic gastritis and possibly esophagitis -Start PPI -Clear liquid diet  Abdominal pain -September 20, 2017 CT abdomen pelvis--infiltration of subcutaneous fat on the lateral right pelvis consistent with soft tissue contusion -Likely secondary to mechanical fall  Alcohol liver disease -Patient had liver biopsy June 2019 -showed marked hepatocyte injury, mild steatohepatitis with extensive pericellular fibrosis stage 2 of 4  (no bridging fibrosis  Alcohol abuse -Alcohol withdrawal protocol  Thrombocytopenia -Likely secondary to alcoholic liver disease with acute worsening due to infectious process -B12 -TSH  Coagulopathy -Likely due to alcoholic liver disease -Consult hematology, possible DIC  Hypokalemia/hypomagnesemia -Replete      Disposition Plan:   Home in 2-3 days  Family Communication:   Mother updated at bedside  11/21--Total time spent 35 minutes.  Greater than 50% spent face to face counseling and coordinating care. 0830 to 0905  Consultants:  hematology  Code Status:  FULL   DVT Prophylaxis:  SCDs   Procedures: As Listed in Progress Note Above  Antibiotics: Zosyn 11/20>>> cubicin 11/21>>>    Subjective: Patient complains of right flank pain.  She denies any  headache, chest pain, shortness breath, coughing, hemoptysis, vomiting, dysuria, hematuria.  She  denies hematochezia, melena, hematemesis.  Objective: Vitals:   09/20/17 0220 09/20/17 0346 09/20/17 0617 09/20/17 0645  BP: 116/69 (!) 132/92 127/71 127/71  Pulse: 99 96 96 96  Resp: 20 19 20    Temp: 99.3 F (37.4 C) 99.7 F (37.6 C) (!) 101.3 F (38.5 C) (!) 101.3 F (38.5 C)  TempSrc: Oral Oral Oral Oral  SpO2: 97% 97% 99% 99%  Weight:   70.1 kg (154 lb 8.7 oz)   Height:   5\' 1"  (1.549 m) 5\' 1"  (1.549 m)    Intake/Output Summary (Last 24 hours) at 09/20/2017 0852 Last data filed at 09/20/2017 0519 Gross per 24 hour  Intake 3280 ml  Output -  Net 3280 ml   Weight change:  Exam:   General:  Pt is alert, follows commands appropriately, not in acute distress  HEENT: No icterus, No thrush, No neck mass, [place]/AT  Cardiovascular: RRR, S1/S2, no rubs, no gallops  Respiratory: Bibasilar crackles.  No wheezing.  Good air movement  Abdomen: Soft/+BS, Right Flank tender with ecchymosis, non distended, no guarding  Extremities: No edema, No lymphangitis, No petechiae, No rashes, no synovitis   Data  Reviewed: I have personally reviewed following labs and imaging studies Basic Metabolic Panel: Recent Labs  Lab 09/20/17 0005 09/20/17 0742  NA 137 134*  K 3.3* 3.1*  CL 104 104  CO2 25 21*  GLUCOSE 110* 115*  BUN 5* <5*  CREATININE 0.33* 0.30*  CALCIUM 8.1* 7.3*  MG 1.5*  --    Liver Function Tests: Recent Labs  Lab 09/20/17 0005 09/20/17 0742  AST 120* 93*  ALT 29 22  ALKPHOS 103 92  BILITOT 5.6* 5.1*  PROT 7.7 6.5  ALBUMIN 3.7 3.1*   Recent Labs  Lab 09/20/17 0005  LIPASE 28   Recent Labs  Lab 09/20/17 0005  AMMONIA 51*   Coagulation Profile: Recent Labs  Lab 09/20/17 0005  INR 1.85   CBC: Recent Labs  Lab 09/20/17 0005 09/20/17 0742  WBC 7.1 5.0  NEUTROABS 5.6  --   HGB 9.6* 8.1*  HCT 29.9* 25.2*  MCV 82.4 82.9  PLT 30* 16*   Cardiac Enzymes: No results for input(s): CKTOTAL, CKMB, CKMBINDEX, TROPONINI in the last 168 hours. BNP: Invalid input(s): POCBNP CBG: No results for input(s): GLUCAP in the last 168 hours. HbA1C: No results for input(s): HGBA1C in the last 72 hours. Urine analysis:    Component Value Date/Time   COLORURINE AMBER (A) 09/20/2017 0001   APPEARANCEUR HAZY (A) 09/20/2017 0001   LABSPEC 1.017 09/20/2017 0001   PHURINE 6.0 09/20/2017 0001   GLUCOSEU NEGATIVE 09/20/2017 0001   HGBUR SMALL (A) 09/20/2017 0001   BILIRUBINUR NEGATIVE 09/20/2017 0001   KETONESUR NEGATIVE 09/20/2017 0001   PROTEINUR 30 (A) 09/20/2017 0001   UROBILINOGEN 0.2 02/17/2014 1509   NITRITE NEGATIVE 09/20/2017 0001   LEUKOCYTESUR NEGATIVE 09/20/2017 0001   Sepsis Labs: @LABRCNTIP (procalcitonin:4,lacticidven:4) ) Recent Results (from the past 240 hour(s))  Culture, blood (routine x 2)     Status: None (Preliminary result)   Collection Time: 09/20/17 12:28 AM  Result Value Ref Range Status   Specimen Description BLOOD LEFT ARM  Final   Special Requests   Final    BOTTLES DRAWN AEROBIC AND ANAEROBIC Blood Culture adequate volume   Culture  NO GROWTH < 12 HOURS  Final   Report Status PENDING  Incomplete  Culture, blood (routine x 2)     Status: None (Preliminary result)   Collection Time: 09/20/17 12:39 AM  Result Value Ref Range Status   Specimen Description BLOOD LEFT HAND  Final   Special Requests   Final    BOTTLES DRAWN AEROBIC AND ANAEROBIC Blood Culture adequate volume   Culture NO GROWTH < 12 HOURS  Final   Report Status PENDING  Incomplete     Scheduled Meds: . folic acid  1 mg Oral Daily  . multivitamin with minerals  1 tablet Oral Daily  . potassium chloride  40 mEq Oral Once  . sertraline  100 mg Oral Daily  . thiamine  100 mg Oral Daily   Or  . thiamine  100 mg Intravenous Daily   Continuous Infusions: . sodium chloride    . magnesium sulfate 1 - 4 g bolus IVPB      Procedures/Studies: Dg Chest 2 View  Result  Date: 09/20/2017 CLINICAL DATA:  Shortness of breath and cough since yesterday. EXAM: CHEST  2 VIEW COMPARISON:  06/09/2004 FINDINGS: The heart size and mediastinal contours are within normal limits. Both lungs are clear. The visualized skeletal structures are unremarkable. Surgical clips in the right upper quadrant. IMPRESSION: No active cardiopulmonary disease. Electronically Signed   By: Lucienne Capers M.D.   On: 09/20/2017 02:39   Ct Head Wo Contrast  Result Date: 09/20/2017 CLINICAL DATA:  36 year old female with head trauma. EXAM: CT HEAD WITHOUT CONTRAST CT CERVICAL SPINE WITHOUT CONTRAST TECHNIQUE: Multidetector CT imaging of the head and cervical spine was performed following the standard protocol without intravenous contrast. Multiplanar CT image reconstructions of the cervical spine were also generated. COMPARISON:  Head CT dated 02/07/2016 FINDINGS: CT HEAD FINDINGS Brain: No evidence of acute infarction, hemorrhage, hydrocephalus, extra-axial collection or mass lesion/mass effect. Vascular: No hyperdense vessel or unexpected calcification. Skull: Normal. Negative for fracture or focal  lesion. Sinuses/Orbits: Mild mucoperiosteal thickening of paranasal sinuses. No air-fluid level. The mastoid air cells are clear. Other: None CT CERVICAL SPINE FINDINGS Alignment: No acute subluxation. Mild reversal of normal cervical lordosis which may be positional or due to muscle spasm. Skull base and vertebrae: No acute fracture. No primary bone lesion or focal pathologic process. Soft tissues and spinal canal: No prevertebral fluid or swelling. No visible canal hematoma. Disc levels:  Mild degenerative changes and disc bulge at C5-C6. Upper chest: Negative. Other: None IMPRESSION: 1. Unremarkable noncontrast CT of the brain. 2. No acute/traumatic cervical spine pathology. Electronically Signed   By: Anner Crete M.D.   On: 09/20/2017 01:23   Ct Cervical Spine Wo Contrast  Result Date: 09/20/2017 CLINICAL DATA:  36 year old female with head trauma. EXAM: CT HEAD WITHOUT CONTRAST CT CERVICAL SPINE WITHOUT CONTRAST TECHNIQUE: Multidetector CT imaging of the head and cervical spine was performed following the standard protocol without intravenous contrast. Multiplanar CT image reconstructions of the cervical spine were also generated. COMPARISON:  Head CT dated 02/07/2016 FINDINGS: CT HEAD FINDINGS Brain: No evidence of acute infarction, hemorrhage, hydrocephalus, extra-axial collection or mass lesion/mass effect. Vascular: No hyperdense vessel or unexpected calcification. Skull: Normal. Negative for fracture or focal lesion. Sinuses/Orbits: Mild mucoperiosteal thickening of paranasal sinuses. No air-fluid level. The mastoid air cells are clear. Other: None CT CERVICAL SPINE FINDINGS Alignment: No acute subluxation. Mild reversal of normal cervical lordosis which may be positional or due to muscle spasm. Skull base and vertebrae: No acute fracture. No primary bone lesion or focal pathologic process. Soft tissues and spinal canal: No prevertebral fluid or swelling. No visible canal hematoma. Disc levels:   Mild degenerative changes and disc bulge at C5-C6. Upper chest: Negative. Other: None IMPRESSION: 1. Unremarkable noncontrast CT of the brain. 2. No acute/traumatic cervical spine pathology. Electronically Signed   By: Anner Crete M.D.   On: 09/20/2017 01:23   Ct Abdomen Pelvis W Contrast  Result Date: 09/20/2017 CLINICAL DATA:  Nausea and vomiting starting today. Abdominal pain. Slip and fall injury striking the lower back. EXAM: CT ABDOMEN AND PELVIS WITH CONTRAST TECHNIQUE: Multidetector CT imaging of the abdomen and pelvis was performed using the standard protocol following bolus administration of intravenous contrast. CONTRAST:  143mL ISOVUE-300 IOPAMIDOL (ISOVUE-300) INJECTION 61% COMPARISON:  11/22/2016 FINDINGS: Lower chest: Lung bases are clear. Hepatobiliary: No focal liver abnormality is seen. Status post cholecystectomy. No biliary dilatation. Pancreas: Unremarkable. No pancreatic ductal dilatation or surrounding inflammatory changes. Spleen: Normal in size without focal abnormality. Adrenals/Urinary Tract: Adrenal  glands are unremarkable. Kidneys are normal, without renal calculi, focal lesion, or hydronephrosis. Bladder is unremarkable. Stomach/Bowel: Stomach is within normal limits. Appendix appears normal. No evidence of bowel wall thickening, distention, or inflammatory changes. Vascular/Lymphatic: No significant vascular findings are present. No enlarged abdominal or pelvic lymph nodes. Reproductive: Uterus is anteverted and is not enlarged. Simple appearing cyst on the right ovary measuring 3.3 cm diameter. Decreasing in size since previous study. Dermoid cyst in the left ovary measuring 3.4 cm diameter without change. Other: No free air or free fluid in the abdomen. Musculoskeletal: Infiltration in the subcutaneous fat lateral to the right pelvis consistent with soft tissue contusion. No loculated collections. No acute fractures identified. IMPRESSION: 1. Soft tissue infiltration  consistent with contusion lateral to the right pelvis. 2. Simple appearing right ovarian cyst without change since previous study. Dermoid cyst in the left ovary without change. 3. No evidence of bowel obstruction or inflammation. Electronically Signed   By: Lucienne Capers M.D.   On: 09/20/2017 01:20    Nicolas Sisler, DO  [facility] Pager [phone]  If 7PM-7AM, please contact night-coverage [email or web address] Password [email or web address] 09/20/2017, 8:52 AM   LOS: 0 days

## 2017-09-20 NOTE — Progress Notes (Signed)
PHARMACY - PHYSICIAN COMMUNICATION CRITICAL VALUE ALERT - BLOOD CULTURE IDENTIFICATION (BCID)  Results for orders placed or performed during the hospital encounter of 09/19/17  Blood Culture ID Panel (Reflexed) (Collected: 09/20/2017 12:28 AM)  Result Value Ref Range   Enterococcus species NOT DETECTED NOT DETECTED   Listeria monocytogenes NOT DETECTED NOT DETECTED   Staphylococcus species NOT DETECTED NOT DETECTED   Staphylococcus aureus NOT DETECTED NOT DETECTED   Streptococcus species DETECTED (A) NOT DETECTED   Streptococcus agalactiae DETECTED (A) NOT DETECTED   Streptococcus pneumoniae NOT DETECTED NOT DETECTED   Streptococcus pyogenes NOT DETECTED NOT DETECTED   Acinetobacter baumannii NOT DETECTED NOT DETECTED   Enterobacteriaceae species NOT DETECTED NOT DETECTED   Enterobacter cloacae complex NOT DETECTED NOT DETECTED   Escherichia coli NOT DETECTED NOT DETECTED   Klebsiella oxytoca NOT DETECTED NOT DETECTED   Klebsiella pneumoniae NOT DETECTED NOT DETECTED   Proteus species NOT DETECTED NOT DETECTED   Serratia marcescens NOT DETECTED NOT DETECTED   Haemophilus influenzae NOT DETECTED NOT DETECTED   Neisseria meningitidis NOT DETECTED NOT DETECTED   Pseudomonas aeruginosa NOT DETECTED NOT DETECTED   Candida albicans NOT DETECTED NOT DETECTED   Candida glabrata NOT DETECTED NOT DETECTED   Candida krusei NOT DETECTED NOT DETECTED   Candida parapsilosis NOT DETECTED NOT DETECTED   Candida tropicalis NOT DETECTED NOT DETECTED    Name of physician (or Provider) Contacted: Dr Tat  Changes to prescribed antibiotics required: Rec change to ancef 2gm IV q8 hours  Beverlee Nims 09/20/2017  5:22 PM

## 2017-09-20 NOTE — Progress Notes (Signed)
Pharmacy Antibiotic Note  Victoria Gordon is a 36 y.o. female admitted on 09/19/2017 with sepsis.  Pharmacy has been consulted for Zosyn dosing. . Plan: Zosyn 3.375g IV q8h (4 hour infusion).  Also on Daptomycin 6mg /kg IV q24h Ordered baseline CPK F/U cxs and clinical progress Monitor V/S, labs, and levels as indicated  Height: 5\' 1"  (154.9 cm) Weight: 154 lb 8.7 oz (70.1 kg) IBW/kg (Calculated) : 47.8  Temp (24hrs), Avg:100.3 F (37.9 C), Min:99.3 F (37.4 C), Max:101.3 F (38.5 C)  Recent Labs  Lab 09/20/17 0005 09/20/17 0158 09/20/17 0742 09/20/17 0748  WBC 7.1  --  5.0  --   CREATININE 0.33*  --  0.30*  --   LATICACIDVEN 2.8* 2.8*  --  2.9*    Estimated Creatinine Clearance: 88.7 mL/min (A) (by C-G formula based on SCr of 0.3 mg/dL (L)).    Allergies  Allergen Reactions  . Vancomycin Hives and Itching    Antimicrobials this admission: Zosyn 11/21 >>  Daptomycin 11/21 >>  Vancomycin x 1 dose in ED 11/21--> pt had itching and rash: given solumedrol and benadryl  Dose adjustments this admission: N/A  Microbiology results: 11/21 BCx: pending 11/21 UCx: pending  Thank you for allowing pharmacy to be a part of this patient's care.  Isac Sarna, BS Pharm D, [place] Clinical Pharmacist Pager [phone] 09/20/2017 9:53 AM

## 2017-09-20 NOTE — Consult Note (Signed)
[facility] Consultation Oncology  Name: Victoria Gordon      MRN: [id number]    Location: A315/A315-01  Date: 09/20/2017 Time:10:20 AM   REFERRING PHYSICIAN:  Dr. Shanon Brow Tat  REASON FOR CONSULT:  Thrombocytopenia   DIAGNOSIS:  Abdominal pain with nausea and vomiting, alcohol intoxication, thrombocytopenia, anemia  HISTORY OF PRESENT ILLNESS:   36 year old female with a history of alcohol abuse and alcohol related liver injury, depression, PTSD presented for 2-day history of right-sided abdominal pain associated with nausea and vomiting.  Patient has a history of chronic daily alcohol use for about 2 years but states she is stopped around May 2018, however recently she started drinking again.  Patient states that she drinks mixed drinks and drinks about 4 a day.  She states she was having fevers and chills at home as well.  She denies any recent sick contacts, cough, shortness breath, headache, hematochezia, melena, hematemesis.  She states she bruises easily.  On admission her alcohol level was 222.  She has also been having fevers with a T-max of 101.3 F.  She is currently being empirically managed with SIRS with Cubicin and Zosyn.  Blood cultures have been drawn.  On admission CBC demonstrated WBC 7.1K, hemoglobin 9.6 g/dL, hematocrit 29.9%, platelet count 30 K.  Repeat lab work today demonstrate a WBC 5K, hemoglobin 8.1 g/dL, hematocrit 25.2% complete count 16 K.  PT 21.2, INR 1.85, PTT 51.  On admission her AST is elevated at 120, ALT 29, total bilirubin 5.6.  Patient states that since she has been here she has been feeling better.  She still has pain in her back, however she states is been improved with the pain medication that she has been given.   PAST MEDICAL HISTORY:   Past Medical History:  Diagnosis Date  . Alcohol abuse   . Anemia   . Anxiety   . Hemorrhoid   . Hepatitis B   . Major depressive disorder   . PTSD (post-traumatic stress disorder)   . Substance abuse  (HCC)     ALLERGIES: Allergies  Allergen Reactions  . Vancomycin Hives and Itching      MEDICATIONS: I have reviewed the patient's current medications.     PAST SURGICAL HISTORY Past Surgical History:  Procedure Laterality Date  . CESAREAN SECTION    . CHOLECYSTECTOMY    . MOUTH SURGERY    . TUBAL LIGATION Bilateral 07/22/2014   Procedure: POST PARTUM TUBAL LIGATION;  Surgeon: Woodroe Mode, MD;  Location: [facility];  Service: Gynecology;  Laterality: Bilateral;    FAMILY HISTORY: Family History  Problem Relation Age of Onset  . Diabetes Maternal Grandmother   . Diabetes Paternal Grandfather     SOCIAL HISTORY:  reports that  has never smoked. she has never used smokeless tobacco. She reports that she drinks alcohol. She reports that she does not use drugs.    PHYSICAL EXAM: Most Recent Vital Signs: Blood pressure 127/71, pulse 96, temperature (!) 101.3 F (38.5 C), temperature source Oral, resp. rate 20, height 5' 1" (1.549 m), weight 154 lb 8.7 oz (70.1 kg), last menstrual period 09/14/2017, SpO2 99 %.  Constitutional: Well-developed, well-nourished, and in no distress.   HENT:  Head: Normocephalic and atraumatic.  Mouth/Throat: No oropharyngeal exudate. Mucosa moist. Eyes: Pupils are equal, round, and reactive to light. Conjunctivae are normal. No scleral icterus.  Neck: Normal range of motion. Neck supple. No JVD present.  Cardiovascular: Normal rate, regular rhythm and normal heart sounds.  Exam reveals no gallop and no friction rub.   No murmur heard. Pulmonary/Chest: Effort normal and breath sounds normal. No respiratory distress. No wheezes.No rales.  Abdominal: Soft. Bowel sounds are normal. No distension. There is no tenderness. There is no guarding.  Musculoskeletal: No edema or tenderness.  Lymphadenopathy:    No cervical or supraclavicular adenopathy.  Neurological: Alert and oriented to person, place, and time. No cranial nerve deficit.  Skin: Skin is  warm and dry. No rash noted. No erythema. No pallor.  Psychiatric: Affect and judgment normal.   LABORATORY DATA:  Results for orders placed or performed during the hospital encounter of 09/19/17 (from the past 48 hour(s))  Urine rapid drug screen (hosp performed)     Status: Abnormal   Collection Time: 09/20/17 12:01 AM  Result Value Ref Range   Opiates NONE DETECTED NONE DETECTED   Cocaine NONE DETECTED NONE DETECTED   Benzodiazepines POSITIVE (A) NONE DETECTED   Amphetamines NONE DETECTED NONE DETECTED   Tetrahydrocannabinol NONE DETECTED NONE DETECTED   Barbiturates NONE DETECTED NONE DETECTED    Comment:        DRUG SCREEN FOR MEDICAL PURPOSES ONLY.  IF CONFIRMATION IS NEEDED FOR ANY PURPOSE, NOTIFY LAB WITHIN 5 DAYS.        LOWEST DETECTABLE LIMITS FOR URINE DRUG SCREEN Drug Class       Cutoff (ng/mL) Amphetamine      1000 Barbiturate      200 Benzodiazepine   409 Tricyclics       811 Opiates          300 Cocaine          300 THC              50   Urinalysis, Routine w reflex microscopic     Status: Abnormal   Collection Time: 09/20/17 12:01 AM  Result Value Ref Range   Color, Urine AMBER (A) YELLOW    Comment: BIOCHEMICALS MAY BE AFFECTED BY COLOR   APPearance HAZY (A) CLEAR   Specific Gravity, Urine 1.017 1.005 - 1.030   pH 6.0 5.0 - 8.0   Glucose, UA NEGATIVE NEGATIVE mg/dL   Hgb urine dipstick SMALL (A) NEGATIVE   Bilirubin Urine NEGATIVE NEGATIVE   Ketones, ur NEGATIVE NEGATIVE mg/dL   Protein, ur 30 (A) NEGATIVE mg/dL   Nitrite NEGATIVE NEGATIVE   Leukocytes, UA NEGATIVE NEGATIVE   RBC / HPF 6-30 0 - 5 RBC/hpf   WBC, UA 0-5 0 - 5 WBC/hpf   Bacteria, UA NONE SEEN NONE SEEN   Squamous Epithelial / LPF 6-30 (A) NONE SEEN   Mucus PRESENT    Hyaline Casts, UA PRESENT   Comprehensive metabolic panel     Status: Abnormal   Collection Time: 09/20/17 12:05 AM  Result Value Ref Range   Sodium 137 135 - 145 mmol/L   Potassium 3.3 (L) 3.5 - 5.1 mmol/L    Chloride 104 101 - 111 mmol/L   CO2 25 22 - 32 mmol/L   Glucose, Bld 110 (H) 65 - 99 mg/dL   BUN 5 (L) 6 - 20 mg/dL   Creatinine, Ser 0.33 (L) 0.44 - 1.00 mg/dL   Calcium 8.1 (L) 8.9 - 10.3 mg/dL   Total Protein 7.7 6.5 - 8.1 g/dL   Albumin 3.7 3.5 - 5.0 g/dL   AST 120 (H) 15 - 41 U/L   ALT 29 14 - 54 U/L   Alkaline Phosphatase 103 38 - 126 U/L   Total  Bilirubin 5.6 (H) 0.3 - 1.2 mg/dL   GFR calc non Af Amer >60 >60 mL/min   GFR calc Af Amer >60 >60 mL/min    Comment: (NOTE) The eGFR has been calculated using the CKD EPI equation. This calculation has not been validated in all clinical situations. eGFR's persistently <60 mL/min signify possible Chronic Kidney Disease.    Anion gap 8 5 - 15  Ethanol     Status: Abnormal   Collection Time: 09/20/17 12:05 AM  Result Value Ref Range   Alcohol, Ethyl (B) 222 (H) <10 mg/dL    Comment:        LOWEST DETECTABLE LIMIT FOR SERUM ALCOHOL IS 10 mg/dL FOR MEDICAL PURPOSES ONLY   Lipase, blood     Status: None   Collection Time: 09/20/17 12:05 AM  Result Value Ref Range   Lipase 28 11 - 51 U/L  CBC with Differential     Status: Abnormal   Collection Time: 09/20/17 12:05 AM  Result Value Ref Range   WBC 7.1 4.0 - 10.5 K/uL   RBC 3.63 (L) 3.87 - 5.11 MIL/uL   Hemoglobin 9.6 (L) 12.0 - 15.0 g/dL   HCT 29.9 (L) 36.0 - 46.0 %   MCV 82.4 78.0 - 100.0 fL   MCH 26.4 26.0 - 34.0 pg   MCHC 32.1 30.0 - 36.0 g/dL   RDW 21.0 (H) 11.5 - 15.5 %   Platelets 30 (L) 150 - 400 K/uL    Comment: SPECIMEN CHECKED FOR CLOTS PLATELET COUNT CONFIRMED BY SMEAR    Neutrophils Relative % 78 %   Neutro Abs 5.6 1.7 - 7.7 K/uL   Lymphocytes Relative 14 %   Lymphs Abs 1.0 0.7 - 4.0 K/uL   Monocytes Relative 8 %   Monocytes Absolute 0.5 0.1 - 1.0 K/uL   Eosinophils Relative 0 %   Eosinophils Absolute 0.0 0.0 - 0.7 K/uL   Basophils Relative 0 %   Basophils Absolute 0.0 0.0 - 0.1 K/uL  Lactic acid, plasma     Status: Abnormal   Collection Time: 09/20/17  12:05 AM  Result Value Ref Range   Lactic Acid, Venous 2.8 (HH) 0.5 - 1.9 mmol/L    Comment: CRITICAL RESULT CALLED TO, READ BACK BY AND VERIFIED WITH: [email or web address].B _0  BY MATTHEWS,B 11.21.18   Protime-INR     Status: Abnormal   Collection Time: 09/20/17 12:05 AM  Result Value Ref Range   Prothrombin Time 21.2 (H) 11.4 - 15.2 seconds   INR 1.85   APTT     Status: Abnormal   Collection Time: 09/20/17 12:05 AM  Result Value Ref Range   aPTT 51 (H) 24 - 36 seconds    Comment:        IF BASELINE aPTT IS ELEVATED, SUGGEST PATIENT RISK ASSESSMENT BE USED TO DETERMINE APPROPRIATE ANTICOAGULANT THERAPY.   Magnesium     Status: Abnormal   Collection Time: 09/20/17 12:05 AM  Result Value Ref Range   Magnesium 1.5 (L) 1.7 - 2.4 mg/dL  Ammonia     Status: Abnormal   Collection Time: 09/20/17 12:05 AM  Result Value Ref Range   Ammonia 51 (H) 9 - 35 umol/L  Culture, blood (routine x 2)     Status: None (Preliminary result)   Collection Time: 09/20/17 12:28 AM  Result Value Ref Range   Specimen Description BLOOD LEFT ARM    Special Requests      BOTTLES DRAWN AEROBIC AND ANAEROBIC Blood Culture adequate volume   Culture  NO GROWTH < 12 HOURS    Report Status PENDING   Culture, blood (routine x 2)     Status: None (Preliminary result)   Collection Time: 09/20/17 12:39 AM  Result Value Ref Range   Specimen Description BLOOD LEFT HAND    Special Requests      BOTTLES DRAWN AEROBIC AND ANAEROBIC Blood Culture adequate volume   Culture NO GROWTH < 12 HOURS    Report Status PENDING   Lactic acid, plasma     Status: Abnormal   Collection Time: 09/20/17  1:58 AM  Result Value Ref Range   Lactic Acid, Venous 2.8 (HH) 0.5 - 1.9 mmol/L    Comment: CRITICAL VALUE NOTED.  VALUE IS CONSISTENT WITH PREVIOUSLY REPORTED AND CALLED VALUE.  CBC     Status: Abnormal   Collection Time: 09/20/17  7:42 AM  Result Value Ref Range   WBC 5.0 4.0 - 10.5 K/uL   RBC 3.04 (L) 3.87 - 5.11 MIL/uL   Hemoglobin  8.1 (L) 12.0 - 15.0 g/dL   HCT 25.2 (L) 36.0 - 46.0 %   MCV 82.9 78.0 - 100.0 fL   MCH 26.6 26.0 - 34.0 pg   MCHC 32.1 30.0 - 36.0 g/dL   RDW 21.3 (H) 11.5 - 15.5 %   Platelets 16 (LL) 150 - 400 K/uL    Comment: PLATELET COUNT CONFIRMED BY SMEAR SPECIMEN CHECKED FOR CLOTS REPEATED TO VERIFY CRITICAL RESULT CALLED TO, READ BACK BY AND VERIFIED WITH: M HAWKINS AT 0818 BY HFLYNT 09/19/17   Comprehensive metabolic panel     Status: Abnormal   Collection Time: 09/20/17  7:42 AM  Result Value Ref Range   Sodium 134 (L) 135 - 145 mmol/L   Potassium 3.1 (L) 3.5 - 5.1 mmol/L   Chloride 104 101 - 111 mmol/L   CO2 21 (L) 22 - 32 mmol/L   Glucose, Bld 115 (H) 65 - 99 mg/dL   BUN <5 (L) 6 - 20 mg/dL   Creatinine, Ser 0.30 (L) 0.44 - 1.00 mg/dL   Calcium 7.3 (L) 8.9 - 10.3 mg/dL   Total Protein 6.5 6.5 - 8.1 g/dL   Albumin 3.1 (L) 3.5 - 5.0 g/dL   AST 93 (H) 15 - 41 U/L   ALT 22 14 - 54 U/L   Alkaline Phosphatase 92 38 - 126 U/L   Total Bilirubin 5.1 (H) 0.3 - 1.2 mg/dL   GFR calc non Af Amer >60 >60 mL/min   GFR calc Af Amer >60 >60 mL/min    Comment: (NOTE) The eGFR has been calculated using the CKD EPI equation. This calculation has not been validated in all clinical situations. eGFR's persistently <60 mL/min signify possible Chronic Kidney Disease.    Anion gap 9 5 - 15  TSH     Status: Abnormal   Collection Time: 09/20/17  7:42 AM  Result Value Ref Range   TSH 0.299 (L) 0.350 - 4.500 uIU/mL    Comment: Performed by a 3rd Generation assay with a functional sensitivity of <=0.01 uIU/mL.  Lactic acid, plasma     Status: Abnormal   Collection Time: 09/20/17  7:48 AM  Result Value Ref Range   Lactic Acid, Venous 2.9 (HH) 0.5 - 1.9 mmol/L    Comment: CRITICAL RESULT CALLED TO, READ BACK BY AND VERIFIED WITH: HAWKINS,M AT 8:35AM ON 09/20/17 BY FESTERMAN,C       RADIOGRAPHY: Dg Chest 2 View  Result Date: 09/20/2017 CLINICAL DATA:  Shortness of breath and cough  since yesterday.  EXAM: CHEST  2 VIEW COMPARISON:  06/09/2004 FINDINGS: The heart size and mediastinal contours are within normal limits. Both lungs are clear. The visualized skeletal structures are unremarkable. Surgical clips in the right upper quadrant. IMPRESSION: No active cardiopulmonary disease. Electronically Signed   By: Lucienne Capers M.D.   On: 09/20/2017 02:39   Ct Head Wo Contrast  Result Date: 09/20/2017 CLINICAL DATA:  36 year old female with head trauma. EXAM: CT HEAD WITHOUT CONTRAST CT CERVICAL SPINE WITHOUT CONTRAST TECHNIQUE: Multidetector CT imaging of the head and cervical spine was performed following the standard protocol without intravenous contrast. Multiplanar CT image reconstructions of the cervical spine were also generated. COMPARISON:  Head CT dated 02/07/2016 FINDINGS: CT HEAD FINDINGS Brain: No evidence of acute infarction, hemorrhage, hydrocephalus, extra-axial collection or mass lesion/mass effect. Vascular: No hyperdense vessel or unexpected calcification. Skull: Normal. Negative for fracture or focal lesion. Sinuses/Orbits: Mild mucoperiosteal thickening of paranasal sinuses. No air-fluid level. The mastoid air cells are clear. Other: None CT CERVICAL SPINE FINDINGS Alignment: No acute subluxation. Mild reversal of normal cervical lordosis which may be positional or due to muscle spasm. Skull base and vertebrae: No acute fracture. No primary bone lesion or focal pathologic process. Soft tissues and spinal canal: No prevertebral fluid or swelling. No visible canal hematoma. Disc levels:  Mild degenerative changes and disc bulge at C5-C6. Upper chest: Negative. Other: None IMPRESSION: 1. Unremarkable noncontrast CT of the brain. 2. No acute/traumatic cervical spine pathology. Electronically Signed   By: Anner Crete M.D.   On: 09/20/2017 01:23   Ct Cervical Spine Wo Contrast  Result Date: 09/20/2017 CLINICAL DATA:  36 year old female with head trauma. EXAM: CT HEAD WITHOUT CONTRAST  CT CERVICAL SPINE WITHOUT CONTRAST TECHNIQUE: Multidetector CT imaging of the head and cervical spine was performed following the standard protocol without intravenous contrast. Multiplanar CT image reconstructions of the cervical spine were also generated. COMPARISON:  Head CT dated 02/07/2016 FINDINGS: CT HEAD FINDINGS Brain: No evidence of acute infarction, hemorrhage, hydrocephalus, extra-axial collection or mass lesion/mass effect. Vascular: No hyperdense vessel or unexpected calcification. Skull: Normal. Negative for fracture or focal lesion. Sinuses/Orbits: Mild mucoperiosteal thickening of paranasal sinuses. No air-fluid level. The mastoid air cells are clear. Other: None CT CERVICAL SPINE FINDINGS Alignment: No acute subluxation. Mild reversal of normal cervical lordosis which may be positional or due to muscle spasm. Skull base and vertebrae: No acute fracture. No primary bone lesion or focal pathologic process. Soft tissues and spinal canal: No prevertebral fluid or swelling. No visible canal hematoma. Disc levels:  Mild degenerative changes and disc bulge at C5-C6. Upper chest: Negative. Other: None IMPRESSION: 1. Unremarkable noncontrast CT of the brain. 2. No acute/traumatic cervical spine pathology. Electronically Signed   By: Anner Crete M.D.   On: 09/20/2017 01:23   Ct Abdomen Pelvis W Contrast  Result Date: 09/20/2017 CLINICAL DATA:  Nausea and vomiting starting today. Abdominal pain. Slip and fall injury striking the lower back. EXAM: CT ABDOMEN AND PELVIS WITH CONTRAST TECHNIQUE: Multidetector CT imaging of the abdomen and pelvis was performed using the standard protocol following bolus administration of intravenous contrast. CONTRAST:  170m ISOVUE-300 IOPAMIDOL (ISOVUE-300) INJECTION 61% COMPARISON:  11/22/2016 FINDINGS: Lower chest: Lung bases are clear. Hepatobiliary: No focal liver abnormality is seen. Status post cholecystectomy. No biliary dilatation. Pancreas: Unremarkable. No  pancreatic ductal dilatation or surrounding inflammatory changes. Spleen: Normal in size without focal abnormality. Adrenals/Urinary Tract: Adrenal glands are unremarkable. Kidneys are normal, without renal calculi,  focal lesion, or hydronephrosis. Bladder is unremarkable. Stomach/Bowel: Stomach is within normal limits. Appendix appears normal. No evidence of bowel wall thickening, distention, or inflammatory changes. Vascular/Lymphatic: No significant vascular findings are present. No enlarged abdominal or pelvic lymph nodes. Reproductive: Uterus is anteverted and is not enlarged. Simple appearing cyst on the right ovary measuring 3.3 cm diameter. Decreasing in size since previous study. Dermoid cyst in the left ovary measuring 3.4 cm diameter without change. Other: No free air or free fluid in the abdomen. Musculoskeletal: Infiltration in the subcutaneous fat lateral to the right pelvis consistent with soft tissue contusion. No loculated collections. No acute fractures identified. IMPRESSION: 1. Soft tissue infiltration consistent with contusion lateral to the right pelvis. 2. Simple appearing right ovarian cyst without change since previous study. Dermoid cyst in the left ovary without change. 3. No evidence of bowel obstruction or inflammation. Electronically Signed   By: Lucienne Capers M.D.   On: 09/20/2017 01:20        ASSESSMENT: 1. Thrombocytopenia 2. Normocytic anemia 3. Abdominal pain 4. Transaminitis likely due to chronic alcohol use 5. Alcohol intoxication 6. Chronic alcohol abuse   PLAN:  -We will check an abdominal ultrasound to rule out splenomegaly as the cause of her thrombocytopenia.  -Suspect her thrombocytopenia may be related to her underlying liver disease from chronic alcohol abuse.  -Liver disease severe enough to impair the hepatic synthesis of coagulation factors can cause a severe coagulopathy. Like DIC, severe liver disease is associated with reductions in both  procoagulant and anticoagulant factors as well as thrombocytopenia; patients can have bleeding or thrombosis. The coagulation factor reductions and thrombocytopenia in severe liver disease are often caused by a combination of hypersplenism and thrombopoietin (TPO) deficiency, since the liver is the primary site of TPO synthesis. Unlike DIC, patients with severe liver disease typically present with a known source of liver injury (eg, acute hepatitis, alcoholic cirrhosis) and abnormal liver function tests, although transaminases may appear to normalize if liver synthetic function is severely impaired. Some clinicians consider factor VIII levels to be helpful because factor VIII is not produced by hepatocytes and thus is often low in DIC and high in severe liver disease. Therefore I will check a Factor VIII level.  -If factor VIII level is low then this is likely DIC. Treatment of DIC is to treat the underlying cause, which may be SIRS here.  -Check fibrinogen which will be low in DIC.  -Also check B12, folate to make sure her cytopenias are not related to nutritional deficiencies. If folate is low recommend to place on folate 53m PO daily. If B12 is low, recommend to start Vitamin B12 injections.  -Check iron studies for her anemia as well, and also check a Coombs test to rule out a hemolytic anemia. Check haptoglobin and LDH to rule out hemolytic anemia. Haptoglobin and fibrinogen will be low and LDH high in hemolytic anemia. Check pathologist review of peripheral smear to rule out schistocytes for TTP. In her case her haptoglobin may be low just due to her underlying liver disease.     All questions were answered. The patient knows to call the clinic with any problems, questions or concerns. We can certainly see the patient much sooner if necessary.   LTwana First MD

## 2017-09-20 NOTE — ED Provider Notes (Signed)
[facility] EMERGENCY DEPARTMENT Provider Note   CSN: [id number] Arrival date & time: 09/19/17  2346  Time seen 24:00   History   Chief Complaint Chief Complaint  Patient presents with  . Emesis    HPI Victoria Gordon is a 36 y.o. female.  HPI patient reports November 18 she was walking in the dark and she tripped and fell over a speaker in her house.  She shows me a large bruised area in her right lateral lower abdomen.  She states the following day November 19 she fell in the tub in the evening.  She states she fell back and hit the back of her head but denies loss of consciousness.  She initially told me she did not have any neck pain however during my exam she did not start tell me where her neck hurt.  She also complains of pain in her lower back and her right flank area.  She states she was fine that evening however when she woke up at 10 AM on November 20 she started feeling bad with nausea and has had about 5 episodes of vomiting.  She states she had fever which is undocumented.  She states she has been sweaty all day and having chills.  She complains of diffuse abdominal pain but denies diarrhea.  Looking at her chart patient has a history of urosepsis from pyelonephritis, she denies dysuria, frequency, or hematuria.  She denies any other respiratory symptoms such as coughing, sneezing, or sore throat.  She does states she started feeling short of breath this morning.  She states her eyes are swollen "from vomiting".  Patient states she has had this abdominal pain before when she was having rectal bleeding and got a blood transfusion.  However she cannot tell me what caused her abdominal pain.  The patient states she drinks only once a week and guesses maybe she drinks a 6 pack.  She did states she had 2 beers earlier this morning initially she said after she started vomiting and when I questioned her about it she then said before.  PCP [facility]  Past Medical  History:  Diagnosis Date  . Alcohol abuse   . Anemia   . Anxiety   . Hemorrhoid   . Hepatitis B   . Major depressive disorder   . PTSD (post-traumatic stress disorder)   . Substance abuse [facility])     Patient Active Problem List   Diagnosis Date Noted  . Hepatic steatosis 09/05/2016  . Hiatal hernia 09/05/2016  . Sepsis due to Escherichia coli (E. coli) ([place]) 09/03/2016  . Pyelonephritis 09/03/2016  . Elevated lactic acid level   . Alcohol use disorder, severe, dependence ([place]) 01/30/2016  . PTSD (post-traumatic stress disorder) 01/30/2016  . MDD (major depressive disorder), recurrent severe, without psychosis ([place]) 01/30/2016  . Hypokalemia 01/30/2016  . Rubella non-immune status, antepartum 02/18/2014  . UTI (urinary tract infection) in pregnancy in second trimester 02/17/2014    Past Surgical History:  Procedure Laterality Date  . CESAREAN SECTION    . CHOLECYSTECTOMY    . MOUTH SURGERY    . TUBAL LIGATION Bilateral 07/22/2014   Procedure: POST PARTUM TUBAL LIGATION;  Surgeon: Woodroe Mode, MD;  Location: [facility];  Service: Gynecology;  Laterality: Bilateral;    OB History    Gravida Para Term Preterm AB Living   7 5 5  0 2 5   SAB TAB Ectopic Multiple Live Births   2 0 0 0 5  Home Medications    Per care everywhere she is on vitamin D, iron tablets, Lasix 40 mg daily, MiraLAX 17 g daily, Micro-K tablets 20 mEq daily, Inderal 20 mg twice daily, Zoloft, Spironolactone 100 mg tablets daily, vitamin B-1 or thiamine 100 mg daily   Prior to Admission medications   Medication Sig Start Date End Date Taking? Authorizing Provider  acetaminophen (TYLENOL) 500 MG tablet Take 1,000 mg by mouth every 6 (six) hours as needed for moderate pain or headache.    [provider]  sertraline (ZOLOFT) 100 MG tablet Take 1 tablet (100 mg total) by mouth daily. For depression 02/05/16   Lindell Spar I, NP  traZODone (DESYREL) 150 MG tablet Take 1 tablet (150 mg total) by  mouth at bedtime. For insomnia Patient not taking: Reported on 09/03/2016 02/05/16   Encarnacion Slates, NP    Family History Family History  Problem Relation Age of Onset  . Diabetes Maternal Grandmother   . Diabetes Paternal Grandfather     Social History Social History   Tobacco Use  . Smoking status: Never Smoker  . Smokeless tobacco: Never Used  Substance Use Topics  . Alcohol use: Yes    Comment: last drink was this am, drinks occassionaly  . Drug use: No  Although her records at [facility] states she has been sober since May, patient states she only drinks once a week and estimates a 6 pack.  She does states she had 2 beers today. She is unemployed   Allergies   Patient has no known allergies.   Review of Systems Review of Systems  All other systems reviewed and are negative.    Physical Exam Updated Vital Signs BP 116/69 (BP Location: Left Arm)   Pulse 99   Temp 99.3 F (37.4 C) (Oral)   Resp 20   Ht 5\' 1"  (1.549 m)   Wt 68 kg (150 lb)   SpO2 97%   BMI 28.34 kg/m   Vital signs normal    Physical Exam  Constitutional: She is oriented to person, place, and time. She appears well-developed and well-nourished.  Non-toxic appearance. She does not appear ill. No distress.  HENT:  Head: Normocephalic and atraumatic.    Right Ear: External ear normal.  Left Ear: External ear normal.  Nose: Nose normal. No mucosal edema or rhinorrhea.  Mouth/Throat: Mucous membranes are dry. No dental abscesses or uvula swelling.  She has some diffuse tenderness of her posterior head without localization or laceration.  Eyes: Conjunctivae and EOM are normal. Pupils are equal, round, and reactive to light.    Patient has a sub-conjunctival hemorrhage medially of her right globe, patient states she was unaware of it.  She does have some puffiness of her eyelids.  Neck: Normal range of motion and full passive range of motion without pain. Neck supple.    She has some diffuse  tenderness of her paraspinous muscles of her cervical spine without localization.  However she is noted to move her head freely during the course of normal conversation.  Cardiovascular: Normal rate, regular rhythm and normal heart sounds. Exam reveals no gallop and no friction rub.  No murmur heard. Pulmonary/Chest: Effort normal and breath sounds normal. No respiratory distress. She has no wheezes. She has no rhonchi. She has no rales. She exhibits no tenderness and no crepitus.  Abdominal: Soft. Normal appearance and bowel sounds are normal. She exhibits no distension. There is tenderness. There is no rebound and no guarding.  Patient has a large bruise with purplish discoloration over her superior iliac wing on the right as shown.  Musculoskeletal: Normal range of motion. She exhibits no edema or tenderness.       Back:  Moves all extremities well.  Patient has some tenderness of her sacral area, she is nontender in her lumbar spine.  She also has some tenderness in the right flank that most of her pain is over her abdomen as seen on previous graft.  Neurological: She is alert and oriented to person, place, and time. She has normal strength. No cranial nerve deficit.  Skin: Skin is warm, dry and intact. No rash noted. No erythema. No pallor.  Psychiatric: She has a normal mood and affect. Her speech is normal and behavior is normal. Her mood appears not anxious.  Nursing note and vitals reviewed.    ED Treatments / Results  Labs (all labs ordered are listed, but only abnormal results are displayed) Results for orders placed or performed during the hospital encounter of 09/19/17  Culture, blood (routine x 2)  Result Value Ref Range   Specimen Description BLOOD LEFT ARM    Special Requests      BOTTLES DRAWN AEROBIC AND ANAEROBIC Blood Culture adequate volume   Culture PENDING    Report Status PENDING   Culture, blood (routine x 2)  Result Value Ref Range   Specimen Description  BLOOD LEFT HAND    Special Requests      BOTTLES DRAWN AEROBIC AND ANAEROBIC Blood Culture adequate volume   Culture PENDING    Report Status PENDING   Comprehensive metabolic panel  Result Value Ref Range   Sodium 137 135 - 145 mmol/L   Potassium 3.3 (L) 3.5 - 5.1 mmol/L   Chloride 104 101 - 111 mmol/L   CO2 25 22 - 32 mmol/L   Glucose, Bld 110 (H) 65 - 99 mg/dL   BUN 5 (L) 6 - 20 mg/dL   Creatinine, Ser 0.33 (L) 0.44 - 1.00 mg/dL   Calcium 8.1 (L) 8.9 - 10.3 mg/dL   Total Protein 7.7 6.5 - 8.1 g/dL   Albumin 3.7 3.5 - 5.0 g/dL   AST 120 (H) 15 - 41 U/L   ALT 29 14 - 54 U/L   Alkaline Phosphatase 103 38 - 126 U/L   Total Bilirubin 5.6 (H) 0.3 - 1.2 mg/dL   GFR calc non Af Amer >60 >60 mL/min   GFR calc Af Amer >60 >60 mL/min   Anion gap 8 5 - 15  Ethanol  Result Value Ref Range   Alcohol, Ethyl (B) 222 (H) <10 mg/dL  Lipase, blood  Result Value Ref Range   Lipase 28 11 - 51 U/L  CBC with Differential  Result Value Ref Range   WBC 7.1 4.0 - 10.5 K/uL   RBC 3.63 (L) 3.87 - 5.11 MIL/uL   Hemoglobin 9.6 (L) 12.0 - 15.0 g/dL   HCT 29.9 (L) 36.0 - 46.0 %   MCV 82.4 78.0 - 100.0 fL   MCH 26.4 26.0 - 34.0 pg   MCHC 32.1 30.0 - 36.0 g/dL   RDW 21.0 (H) 11.5 - 15.5 %   Platelets 30 (L) 150 - 400 K/uL   Neutrophils Relative % 78 %   Neutro Abs 5.6 1.7 - 7.7 K/uL   Lymphocytes Relative 14 %   Lymphs Abs 1.0 0.7 - 4.0 K/uL   Monocytes Relative 8 %   Monocytes Absolute 0.5 0.1 - 1.0 K/uL  Eosinophils Relative 0 %   Eosinophils Absolute 0.0 0.0 - 0.7 K/uL   Basophils Relative 0 %   Basophils Absolute 0.0 0.0 - 0.1 K/uL  Lactic acid, plasma  Result Value Ref Range   Lactic Acid, Venous 2.8 (HH) 0.5 - 1.9 mmol/L  Lactic acid, plasma  Result Value Ref Range   Lactic Acid, Venous 2.8 (HH) 0.5 - 1.9 mmol/L  Urine rapid drug screen (hosp performed)  Result Value Ref Range   Opiates NONE DETECTED NONE DETECTED   Cocaine NONE DETECTED NONE DETECTED   Benzodiazepines POSITIVE  (A) NONE DETECTED   Amphetamines NONE DETECTED NONE DETECTED   Tetrahydrocannabinol NONE DETECTED NONE DETECTED   Barbiturates NONE DETECTED NONE DETECTED  Urinalysis, Routine w reflex microscopic  Result Value Ref Range   Color, Urine AMBER (A) YELLOW   APPearance HAZY (A) CLEAR   Specific Gravity, Urine 1.017 1.005 - 1.030   pH 6.0 5.0 - 8.0   Glucose, UA NEGATIVE NEGATIVE mg/dL   Hgb urine dipstick SMALL (A) NEGATIVE   Bilirubin Urine NEGATIVE NEGATIVE   Ketones, ur NEGATIVE NEGATIVE mg/dL   Protein, ur 30 (A) NEGATIVE mg/dL   Nitrite NEGATIVE NEGATIVE   Leukocytes, UA NEGATIVE NEGATIVE   RBC / HPF 6-30 0 - 5 RBC/hpf   WBC, UA 0-5 0 - 5 WBC/hpf   Bacteria, UA NONE SEEN NONE SEEN   Squamous Epithelial / LPF 6-30 (A) NONE SEEN   Mucus PRESENT    Hyaline Casts, UA PRESENT   Protime-INR  Result Value Ref Range   Prothrombin Time 21.2 (H) 11.4 - 15.2 seconds   INR 1.85   APTT  Result Value Ref Range   aPTT 51 (H) 24 - 36 seconds  Magnesium  Result Value Ref Range   Magnesium 1.5 (L) 1.7 - 2.4 mg/dL  Ammonia  Result Value Ref Range   Ammonia 51 (H) 9 - 35 umol/L   Laboratory interpretation all normal except anemia, elevation of PT/PTT most likely from her cirrhosis, alcohol intoxication, low magnesium from her ETOH abuse, + UDS for benzo's although not prescribed them, elevated LA, new thrombocytopenia (has been as low as 111K in September).    EKG  EKG Interpretation None       Radiology Ct Head Wo Contrast   Ct Cervical Spine Wo Contrast  Result Date: 09/20/2017 CLINICAL DATA:  36 year old female with head trauma. EXAM: CT HEAD WITHOUT CONTRAST CT CERVICAL SPINE WITHOUT CONTRAST TECHNIQUE: Multidetector CT imaging of the head and cervical spine was performed following the standard protocol without intravenous contrast. Multiplanar CT image reconstructions of the cervical spine were also generated. COMPARISON:  Head CT dated 02/07/2016 FINDINGS: CT HEAD FINDINGS  Brain: No evidence of acute infarction, hemorrhage, hydrocephalus, extra-axial collection or mass lesion/mass effect. Vascular: No hyperdense vessel or unexpected calcification. Skull: Normal. Negative for fracture or focal lesion. Sinuses/Orbits: Mild mucoperiosteal thickening of paranasal sinuses. No air-fluid level. The mastoid air cells are clear. Other: None CT CERVICAL SPINE FINDINGS Alignment: No acute subluxation. Mild reversal of normal cervical lordosis which may be positional or due to muscle spasm. Skull base and vertebrae: No acute fracture. No primary bone lesion or focal pathologic process. Soft tissues and spinal canal: No prevertebral fluid or swelling. No visible canal hematoma. Disc levels:  Mild degenerative changes and disc bulge at C5-C6. Upper chest: Negative. Other: None IMPRESSION: 1. Unremarkable noncontrast CT of the brain. 2. No acute/traumatic cervical spine pathology. Electronically Signed   By: Laren Everts.D.  On: 09/20/2017 01:23   Ct Abdomen Pelvis W Contrast  Result Date: 09/20/2017 CLINICAL DATA:  Nausea and vomiting starting today. Abdominal pain. Slip and fall injury striking the lower back. EXAM: CT ABDOMEN AND PELVIS WITH CONTRAST TECHNIQUE: Multidetector CT imaging of the abdomen and pelvis was performed using the standard protocol following bolus administration of intravenous contrast. CONTRAST:  159mL ISOVUE-300 IOPAMIDOL (ISOVUE-300) INJECTION 61% COMPARISON:  11/22/2016 FINDINGS: Lower chest: Lung bases are clear. Hepatobiliary: No focal liver abnormality is seen. Status post cholecystectomy. No biliary dilatation. Pancreas: Unremarkable. No pancreatic ductal dilatation or surrounding inflammatory changes. Spleen: Normal in size without focal abnormality. Adrenals/Urinary Tract: Adrenal glands are unremarkable. Kidneys are normal, without renal calculi, focal lesion, or hydronephrosis. Bladder is unremarkable. Stomach/Bowel: Stomach is within normal limits.  Appendix appears normal. No evidence of bowel wall thickening, distention, or inflammatory changes. Vascular/Lymphatic: No significant vascular findings are present. No enlarged abdominal or pelvic lymph nodes. Reproductive: Uterus is anteverted and is not enlarged. Simple appearing cyst on the right ovary measuring 3.3 cm diameter. Decreasing in size since previous study. Dermoid cyst in the left ovary measuring 3.4 cm diameter without change. Other: No free air or free fluid in the abdomen. Musculoskeletal: Infiltration in the subcutaneous fat lateral to the right pelvis consistent with soft tissue contusion. No loculated collections. No acute fractures identified. IMPRESSION: 1. Soft tissue infiltration consistent with contusion lateral to the right pelvis. 2. Simple appearing right ovarian cyst without change since previous study. Dermoid cyst in the left ovary without change. 3. No evidence of bowel obstruction or inflammation. Electronically Signed   By: Lucienne Capers M.D.   On: 09/20/2017 01:20    Dg Chest 2 View  Result Date: 09/20/2017 CLINICAL DATA:  Shortness of breath and cough since yesterday. EXAM: CHEST  2 VIEW COMPARISON:  06/09/2004 FINDINGS: The heart size and mediastinal contours are within normal limits. Both lungs are clear. The visualized skeletal structures are unremarkable. Surgical clips in the right upper quadrant. IMPRESSION: No active cardiopulmonary disease. Electronically Signed   By: Lucienne Capers M.D.   On: 09/20/2017 02:39      Procedures .Critical Care Performed by: Rolland Porter, MD Authorized by: Rolland Porter, MD   Critical care provider statement:    Critical care time (minutes):  38   Critical care was necessary to treat or prevent imminent or life-threatening deterioration of the following conditions:  Circulatory failure, CNS failure or compromise, dehydration, hepatic failure, metabolic crisis and sepsis   Critical care was time spent personally by me on  the following activities:  Discussions with consultants, evaluation of patient's response to treatment, examination of patient, obtaining history from patient or surrogate, ordering and review of laboratory studies, ordering and review of radiographic studies, pulse oximetry, re-evaluation of patient's condition and review of old charts   (including critical care time)  Medications Ordered in ED Medications  piperacillin-tazobactam (ZOSYN) IVPB 3.375 g (not administered)  vancomycin (VANCOCIN) IVPB 1000 mg/200 mL premix (not administered)  sodium chloride 0.9 % bolus 1,000 mL (0 mLs Intravenous Stopped 09/20/17 0215)  sodium chloride 0.9 % bolus 1,000 mL (0 mLs Intravenous Stopped 09/20/17 0215)  ondansetron (ZOFRAN) injection 4 mg (4 mg Intravenous Given 09/20/17 0024)  iopamidol (ISOVUE-300) 61 % injection 100 mL (100 mLs Intravenous Contrast Given 09/20/17 0051)  magnesium sulfate IVPB 2 g 50 mL (0 g Intravenous Stopped 09/20/17 0327)     Initial Impression / Assessment and Plan / ED Course  I have reviewed the  triage vital signs and the nursing notes.  Pertinent labs & imaging results that were available during my care of the patient were reviewed by me and considered in my medical decision making (see chart for details).      Patient was given IV fluids, she appeared to be dehydrated by exam.  CT scans were ordered of her injured areas.  Laboratory testing was done.  Recheck at 2:20 a.m. patient was able to Tory back to her room from the bathroom, she did appear to have some shortness of breath.  We discussed her laboratory test results.  She does admit to drinking today but states the last time she drank was a week ago.  I suspect she is not telling me the truth.  Her second lactic acid has been drawn and is pending.  She still has a low-grade fever.  Due to her shortness of breath which I think is not related to her anemia which is at her baseline, chest x-ray was done to make sure  she had not aspirated or has a pneumonia.  I rechecked her blood work and she does not have a anion gap acidosis.  We discussed her laboratory test results all which are indicative of continuing alcohol abuse, she now has a thrombocytopenia which indicates she most likely is continuing to drink on a regular basis.  Her urinalysis does not show evidence of infection. She denies feeling confused with her elevated ammonia level.   3:30 AM patient's lactic acid did not improved with IV fluids she was given.  Chest x-ray does not show anything acute.  Patient has continued low-grade fever and history of sepsis, therefore she was given IV antibiotics for unknown source.  I will talk to the hospitalist about admission.  03:54 AM Dr Darrick Meigs, hospitalist, will admit.   Review of care everywhere shows her hemoglobin in March was 8.7 and appears her normal is 9 0.5 g, her total bilirubin has been anywhere between 10.9-16 this year, with SGOT in the range of 123-146.  She did get a blood transfusion in February when her hemoglobin dropped to 4.9  Review of the [place] shows she only has 1 prescription for oxycodone written in March.  Final Clinical Impressions(s) / ED Diagnoses   Final diagnoses:  Alcoholic intoxication without complication (HCC)  Low magnesium level  Thrombocytopenia (HCC)  Lactic acidosis  Hepatic encephalopathy (HCC)  Contusion of abdominal wall, initial encounter  Fall, initial encounter  Subconjunctival hemorrhage of left eye   Plan admission  Rolland Porter, MD, Barbette Or, MD 09/20/17 [phone]

## 2017-09-20 NOTE — ED Notes (Signed)
NT entered room to transport pt to AP-315, pt states she is having a reaction and is breaking out, NT notified RN

## 2017-09-21 ENCOUNTER — Other Ambulatory Visit: Payer: Self-pay

## 2017-09-21 DIAGNOSIS — E7220 Disorder of urea cycle metabolism, unspecified: Secondary | ICD-10-CM

## 2017-09-21 DIAGNOSIS — B951 Streptococcus, group B, as the cause of diseases classified elsewhere: Secondary | ICD-10-CM

## 2017-09-21 DIAGNOSIS — A401 Sepsis due to streptococcus, group B: Secondary | ICD-10-CM

## 2017-09-21 DIAGNOSIS — R7881 Bacteremia: Secondary | ICD-10-CM

## 2017-09-21 DIAGNOSIS — F10920 Alcohol use, unspecified with intoxication, uncomplicated: Secondary | ICD-10-CM

## 2017-09-21 DIAGNOSIS — D65 Disseminated intravascular coagulation [defibrination syndrome]: Secondary | ICD-10-CM

## 2017-09-21 LAB — URINE CULTURE: SPECIAL REQUESTS: NORMAL

## 2017-09-21 LAB — COMPREHENSIVE METABOLIC PANEL
ALBUMIN: 3 g/dL — AB (ref 3.5–5.0)
ALT: 20 U/L (ref 14–54)
ANION GAP: 4 — AB (ref 5–15)
AST: 67 U/L — ABNORMAL HIGH (ref 15–41)
Alkaline Phosphatase: 86 U/L (ref 38–126)
BUN: 7 mg/dL (ref 6–20)
CO2: 23 mmol/L (ref 22–32)
Calcium: 7.6 mg/dL — ABNORMAL LOW (ref 8.9–10.3)
Chloride: 107 mmol/L (ref 101–111)
Creatinine, Ser: <0.3 mg/dL — ABNORMAL LOW (ref 0.44–1.00)
GLUCOSE: 91 mg/dL (ref 65–99)
POTASSIUM: 3.5 mmol/L (ref 3.5–5.1)
SODIUM: 134 mmol/L — AB (ref 135–145)
TOTAL PROTEIN: 6.3 g/dL — AB (ref 6.5–8.1)
Total Bilirubin: 3.6 mg/dL — ABNORMAL HIGH (ref 0.3–1.2)

## 2017-09-21 LAB — CBC
HEMATOCRIT: 24.4 % — AB (ref 36.0–46.0)
HEMOGLOBIN: 7.6 g/dL — AB (ref 12.0–15.0)
MCH: 26 pg (ref 26.0–34.0)
MCHC: 31.1 g/dL (ref 30.0–36.0)
MCV: 83.6 fL (ref 78.0–100.0)
Platelets: 20 10*3/uL — CL (ref 150–400)
RBC: 2.92 MIL/uL — ABNORMAL LOW (ref 3.87–5.11)
RDW: 21.1 % — ABNORMAL HIGH (ref 11.5–15.5)
WBC: 4.8 10*3/uL (ref 4.0–10.5)

## 2017-09-21 LAB — PROTIME-INR
INR: 2.03
Prothrombin Time: 22.8 seconds — ABNORMAL HIGH (ref 11.4–15.2)

## 2017-09-21 LAB — LACTIC ACID, PLASMA: Lactic Acid, Venous: 0.8 mmol/L (ref 0.5–1.9)

## 2017-09-21 LAB — HIV ANTIBODY (ROUTINE TESTING W REFLEX): HIV Screen 4th Generation wRfx: NONREACTIVE

## 2017-09-21 LAB — HAPTOGLOBIN: Haptoglobin: 27 mg/dL — ABNORMAL LOW (ref 34–200)

## 2017-09-21 NOTE — Progress Notes (Signed)
PROGRESS NOTE  Victoria Gordon PJK:[id number] DOB: [birth year] DOA: 09/19/2017 PCP: Patient, No Pcp Per   Brief History:  36 year old female with a history of alcoholic abuse with alcoholic liver injury, PTSD, depression presenting with 2-3-day history of right-sided abdominal pain and one day history of nausea and vomiting.  The patient had previously drank on a daily basis for over 2 years, but reportedly stopped drinking on Mar 14, 2017.  Since then, she has restarted it.  She states that she only drinks 1 sixpack of beer per week.  She could not tell me how long she has been drinking although it was previously documented from her previous records that she drank over 2 years.  The patient had a mechanical fall on September 17, 2017 tripping over a speaker and falling onto her right side.  She had another fall on September 18, 2017 falling in the bathtub.  She denied any loss of consciousness.  She had complained of subjective fevers and chills at home.  She denied any headache, chest pain, shortness breath, cough, hemoptysis, hematemesis, hematochezia, melena.  Alcohol level in the emergency department was 222.  Since admission, the patient began having fevers up to 101.3 F.  The patient was started on empiric Cubicin and Zosyn.  Blood cultures grew group B streptococcus.  Antibiotics were de-escalated to ceftriaxone.  Assessment/Plan: Sepsis -due to bacteremia -Lactic acid 2.8>>>0.8 -Initially started Zosyn -Initially started Cubicin as patient developed hives to vancomycin -Urinalysis negative for pyuria -Chest x-ray negative for infiltrates -Follow blood cultures -Continue IV fluids -influenza pcr--neg  Group B Streptococcus Bacteremia -d/c zosyn and cubicin -start ceftriaxone -Echo  Coagulopathy/low grade DIC -Likely due to alcoholic liver disease and DIC -Hematology consult appreciated -INR 2.03, PTT 51 fibrinogen 183 -Low haptoglobin likely secondary to chronic  liver disease; LDH normal -Monitor for signs of bleeding  Intractable vomiting -Likely degree of alcoholic gastritis and possibly esophagitis -Start PPI -improved -lipase 28 -Advance to full liquids  Abdominal pain -September 20, 2017 CT abdomen pelvis--infiltration of subcutaneous fat on the lateral right pelvis consistent with soft tissue contusion -Likely secondary to mechanical fall -improved  Alcohol liver disease -Patient had liver biopsy June 2019 -showed marked hepatocyte injury, mild steatohepatitis with extensive pericellular fibrosis stage 2 of 4 (no bridging fibrosis  Alcohol abuse -Alcohol withdrawal protocol -no signs of withdrawal  Thrombocytopenia -Likely secondary to alcoholic liver disease with acute worsening due to infectious process and low grade DIC -B12--924 -TSH/Free T4--suggestive of euthyroid sick syndrome  Hypokalemia/hypomagnesemia -Replete  Hyperammonemia -No altered mental status -Likely secondary to chronic liver disease  History of GI bleed -Ad[facility] to [facility] --felt to be hemorrhoidal bleed status post sclerotherapy and suture fixation of hemorrhoids EGD -July 2018 upper GI bleed--EGD did not show varices bleeding thought to be due to portal gastropathy--      Disposition Plan:   Home in 2-3 days  Family Communication:   No family present  Consultants:  hematology  Code Status:  FULL   DVT Prophylaxis:  SCDs   Procedures: As Listed in Progress Note Above  Antibiotics: Zosyn 11/20>>> cubicin 11/21>>>     Subjective: Patient denies fevers, chills, headache, chest pain, dyspnea, nausea, vomiting, diarrhea, abdominal pain, dysuria, hematuria, hematochezia, and melena.   Objective: Vitals:   09/20/17 2109 09/20/17 2152 09/21/17 0529 09/21/17 1129  BP: (!) 143/72  (!) 144/85 (!) 141/79  Pulse: 69  63 68  Resp: 18  18 18  Temp: 98.9 F (37.2 C)  99 F (37.2 C) 98.9 F (37.2 C)  TempSrc: Oral   Oral Oral  SpO2: 100% 100% 100% 100%  Weight:      Height:        Intake/Output Summary (Last 24 hours) at 09/21/2017 1629 Last data filed at 09/21/2017 1603 Gross per 24 hour  Intake 2390 ml  Output 1900 ml  Net 490 ml   Weight change:  Exam:   General:  Pt is alert, follows commands appropriately, not in acute distress  HEENT: No icterus, No thrush, No neck mass, [place]/AT  Cardiovascular: RRR, S1/S2, no rubs, no gallops  Respiratory: CTA bilaterally, no wheezing, no crackles, no rhonchi  Abdomen: Soft/+BS, non tender, non distended, no guarding  Extremities: No edema, No lymphangitis, No petechiae, No rashes, no synovitis   Data Reviewed: I have personally reviewed following labs and imaging studies Basic Metabolic Panel: Recent Labs  Lab 09/20/17 0005 09/20/17 0742 09/21/17 0458  NA 137 134* 134*  K 3.3* 3.1* 3.5  CL 104 104 107  CO2 25 21* 23  GLUCOSE 110* 115* 91  BUN 5* <5* 7  CREATININE 0.33* 0.30* <0.30*  CALCIUM 8.1* 7.3* 7.6*  MG 1.5*  --   --    Liver Function Tests: Recent Labs  Lab 09/20/17 0005 09/20/17 0742 09/21/17 0458  AST 120* 93* 67*  ALT 29 22 20   ALKPHOS 103 92 86  BILITOT 5.6* 5.1* 3.6*  PROT 7.7 6.5 6.3*  ALBUMIN 3.7 3.1* 3.0*   Recent Labs  Lab 09/20/17 0005  LIPASE 28   Recent Labs  Lab 09/20/17 0005  AMMONIA 51*   Coagulation Profile: Recent Labs  Lab 09/20/17 0005 09/21/17 0458  INR 1.85 2.03   CBC: Recent Labs  Lab 09/20/17 0005 09/20/17 0742 09/21/17 0458  WBC 7.1 5.0 4.8  NEUTROABS 5.6  --   --   HGB 9.6* 8.1* 7.6*  HCT 29.9* 25.2* 24.4*  MCV 82.4 82.9 83.6  PLT 30* 16* 20*   Cardiac Enzymes: Recent Labs  Lab 09/20/17 0936  CKTOTAL 157   BNP: Invalid input(s): POCBNP CBG: No results for input(s): GLUCAP in the last 168 hours. HbA1C: No results for input(s): HGBA1C in the last 72 hours. Urine analysis:    Component Value Date/Time   COLORURINE AMBER (A) 09/20/2017 0001   APPEARANCEUR  HAZY (A) 09/20/2017 0001   LABSPEC 1.017 09/20/2017 0001   PHURINE 6.0 09/20/2017 0001   GLUCOSEU NEGATIVE 09/20/2017 0001   HGBUR SMALL (A) 09/20/2017 0001   BILIRUBINUR NEGATIVE 09/20/2017 0001   KETONESUR NEGATIVE 09/20/2017 0001   PROTEINUR 30 (A) 09/20/2017 0001   UROBILINOGEN 0.2 02/17/2014 1509   NITRITE NEGATIVE 09/20/2017 0001   LEUKOCYTESUR NEGATIVE 09/20/2017 0001   Sepsis Labs: @LABRCNTIP (procalcitonin:4,lacticidven:4) ) Recent Results (from the past 240 hour(s))  Urine culture     Status: Abnormal   Collection Time: 09/20/17 12:01 AM  Result Value Ref Range Status   Specimen Description URINE, CLEAN CATCH  Final   Special Requests Normal  Final   Culture MULTIPLE SPECIES PRESENT, SUGGEST RECOLLECTION (A)  Final   Report Status 09/21/2017 FINAL  Final  Culture, blood (routine x 2)     Status: Abnormal (Preliminary result)   Collection Time: 09/20/17 12:28 AM  Result Value Ref Range Status   Specimen Description BLOOD LEFT ARM  Final   Special Requests   Final    BOTTLES DRAWN AEROBIC AND ANAEROBIC Blood Culture adequate volume  Culture  Setup Time   Final    GRAM POSITIVE COCCI IN CHAINS AEROBIC BOTTLE ONLY Gram Stain Report Called to,Read Back By and Verified With: HAWKINS,M @ 3235 ON 11.21.18 BY BOWMAN,L CRITICAL RESULT CALLED TO, READ BACK BY AND VERIFIED WITH: L.Seay Pharm.D. 17:20 09/20/17 ([email or web address])    Culture (A)  Final    GROUP B STREP(S.AGALACTIAE)ISOLATED SUSCEPTIBILITIES TO FOLLOW Performed at [facility], [place] [address] [place], [place] [address]    Report Status PENDING  Incomplete  Blood Culture ID Panel (Reflexed)     Status: Abnormal   Collection Time: 09/20/17 12:28 AM  Result Value Ref Range Status   Enterococcus species NOT DETECTED NOT DETECTED Final   Listeria monocytogenes NOT DETECTED NOT DETECTED Final   Staphylococcus species NOT DETECTED NOT DETECTED Final   Staphylococcus aureus NOT DETECTED NOT DETECTED Final    Streptococcus species DETECTED (A) NOT DETECTED Final    Comment: CRITICAL RESULT CALLED TO, READ BACK BY AND VERIFIED WITH: L. Seay Pharm.D. 17:20 09/20/17 ([email or web address])    Streptococcus agalactiae DETECTED (A) NOT DETECTED Final    Comment: CRITICAL RESULT CALLED TO, READ BACK BY AND VERIFIED WITH: L. Seay Pharm.D. 17:20 09/20/17 ([email or web address])    Streptococcus pneumoniae NOT DETECTED NOT DETECTED Final   Streptococcus pyogenes NOT DETECTED NOT DETECTED Final   Acinetobacter baumannii NOT DETECTED NOT DETECTED Final   Enterobacteriaceae species NOT DETECTED NOT DETECTED Final   Enterobacter cloacae complex NOT DETECTED NOT DETECTED Final   Escherichia coli NOT DETECTED NOT DETECTED Final   Klebsiella oxytoca NOT DETECTED NOT DETECTED Final   Klebsiella pneumoniae NOT DETECTED NOT DETECTED Final   Proteus species NOT DETECTED NOT DETECTED Final   Serratia marcescens NOT DETECTED NOT DETECTED Final   Haemophilus influenzae NOT DETECTED NOT DETECTED Final   Neisseria meningitidis NOT DETECTED NOT DETECTED Final   Pseudomonas aeruginosa NOT DETECTED NOT DETECTED Final   Candida albicans NOT DETECTED NOT DETECTED Final   Candida glabrata NOT DETECTED NOT DETECTED Final   Candida krusei NOT DETECTED NOT DETECTED Final   Candida parapsilosis NOT DETECTED NOT DETECTED Final   Candida tropicalis NOT DETECTED NOT DETECTED Final    Comment: Performed at [facility], [place] [address], [place], [place] [address]  Culture, blood (routine x 2)     Status: None (Preliminary result)   Collection Time: 09/20/17 12:39 AM  Result Value Ref Range Status   Specimen Description BLOOD LEFT HAND  Final   Special Requests   Final    BOTTLES DRAWN AEROBIC AND ANAEROBIC Blood Culture adequate volume   Culture NO GROWTH 1 DAY  Final   Report Status PENDING  Incomplete     Scheduled Meds: . feeding supplement  1 Container Oral TID BM  . feeding supplement (ENSURE ENLIVE)  237 mL Oral BID BM  . feeding  supplement (PRO-STAT SUGAR FREE 64)  30 mL Oral TID  . folic acid  1 mg Oral Daily  . multivitamin with minerals  1 tablet Oral Daily  . pantoprazole (PROTONIX) IV  40 mg Intravenous Q12H  . sertraline  100 mg Oral Daily  . thiamine  100 mg Oral Daily   Or  . thiamine  100 mg Intravenous Daily   Continuous Infusions: . 0.9 % NaCl with KCl 20 mEq / L 100 mL/hr at 09/21/17 0700  . cefTRIAXone (ROCEPHIN)  IV Stopped (09/20/17 1927)    Procedures/Studies: Dg Chest 2 View  Result Date: 09/20/2017 CLINICAL DATA:  Shortness of breath and cough since yesterday. EXAM: CHEST  2 VIEW COMPARISON:  06/09/2004 FINDINGS: The heart size and mediastinal contours are within normal limits. Both lungs are clear. The visualized skeletal structures are unremarkable. Surgical clips in the right upper quadrant. IMPRESSION: No active cardiopulmonary disease. Electronically Signed   By: Lucienne Capers M.D.   On: 09/20/2017 02:39   Ct Head Wo Contrast  Result Date: 09/20/2017 CLINICAL DATA:  36 year old female with head trauma. EXAM: CT HEAD WITHOUT CONTRAST CT CERVICAL SPINE WITHOUT CONTRAST TECHNIQUE: Multidetector CT imaging of the head and cervical spine was performed following the standard protocol without intravenous contrast. Multiplanar CT image reconstructions of the cervical spine were also generated. COMPARISON:  Head CT dated 02/07/2016 FINDINGS: CT HEAD FINDINGS Brain: No evidence of acute infarction, hemorrhage, hydrocephalus, extra-axial collection or mass lesion/mass effect. Vascular: No hyperdense vessel or unexpected calcification. Skull: Normal. Negative for fracture or focal lesion. Sinuses/Orbits: Mild mucoperiosteal thickening of paranasal sinuses. No air-fluid level. The mastoid air cells are clear. Other: None CT CERVICAL SPINE FINDINGS Alignment: No acute subluxation. Mild reversal of normal cervical lordosis which may be positional or due to muscle spasm. Skull base and vertebrae: No acute  fracture. No primary bone lesion or focal pathologic process. Soft tissues and spinal canal: No prevertebral fluid or swelling. No visible canal hematoma. Disc levels:  Mild degenerative changes and disc bulge at C5-C6. Upper chest: Negative. Other: None IMPRESSION: 1. Unremarkable noncontrast CT of the brain. 2. No acute/traumatic cervical spine pathology. Electronically Signed   By: Anner Crete M.D.   On: 09/20/2017 01:23   Ct Cervical Spine Wo Contrast  Result Date: 09/20/2017 CLINICAL DATA:  36 year old female with head trauma. EXAM: CT HEAD WITHOUT CONTRAST CT CERVICAL SPINE WITHOUT CONTRAST TECHNIQUE: Multidetector CT imaging of the head and cervical spine was performed following the standard protocol without intravenous contrast. Multiplanar CT image reconstructions of the cervical spine were also generated. COMPARISON:  Head CT dated 02/07/2016 FINDINGS: CT HEAD FINDINGS Brain: No evidence of acute infarction, hemorrhage, hydrocephalus, extra-axial collection or mass lesion/mass effect. Vascular: No hyperdense vessel or unexpected calcification. Skull: Normal. Negative for fracture or focal lesion. Sinuses/Orbits: Mild mucoperiosteal thickening of paranasal sinuses. No air-fluid level. The mastoid air cells are clear. Other: None CT CERVICAL SPINE FINDINGS Alignment: No acute subluxation. Mild reversal of normal cervical lordosis which may be positional or due to muscle spasm. Skull base and vertebrae: No acute fracture. No primary bone lesion or focal pathologic process. Soft tissues and spinal canal: No prevertebral fluid or swelling. No visible canal hematoma. Disc levels:  Mild degenerative changes and disc bulge at C5-C6. Upper chest: Negative. Other: None IMPRESSION: 1. Unremarkable noncontrast CT of the brain. 2. No acute/traumatic cervical spine pathology. Electronically Signed   By: Anner Crete M.D.   On: 09/20/2017 01:23   US Abdomen Complete  Result Date: 09/20/2017 CLINICAL  DATA:  Thrombocytopenia EXAM: ABDOMEN ULTRASOUND COMPLETE COMPARISON:  CT 09/20/2017 FINDINGS: Gallbladder: Prior cholecystectomy Common bile duct: Diameter: Dilated, 11 mm, stable dating back to 2017 compatible with post cholecystectomy state. Liver: No focal lesion identified. Within normal limits in parenchymal echogenicity. Portal vein is patent on color Doppler imaging with normal direction of blood flow towards the liver. IVC: No abnormality visualized. Pancreas: Visualized portion unremarkable. Spleen: Enlarged with a craniocaudal length of 14 cm and a splenic volume of 743 mL. Right Kidney: Length: 13.8 cm. Echogenicity within normal limits. No mass or hydronephrosis visualized. Left Kidney: Length: 15.6 cm.  Echogenicity within normal limits. No mass or hydronephrosis visualized. Abdominal aorta: No aneurysm visualized. Other findings: None. IMPRESSION: Splenomegaly as above. Dilated common bile duct is stable since prior CTs dating back to 2017 compatible with post cholecystectomy state. Electronically Signed   By: Rolm Baptise M.D.   On: 09/20/2017 11:47   Ct Abdomen Pelvis W Contrast  Result Date: 09/20/2017 CLINICAL DATA:  Nausea and vomiting starting today. Abdominal pain. Slip and fall injury striking the lower back. EXAM: CT ABDOMEN AND PELVIS WITH CONTRAST TECHNIQUE: Multidetector CT imaging of the abdomen and pelvis was performed using the standard protocol following bolus administration of intravenous contrast. CONTRAST:  146mL ISOVUE-300 IOPAMIDOL (ISOVUE-300) INJECTION 61% COMPARISON:  11/22/2016 FINDINGS: Lower chest: Lung bases are clear. Hepatobiliary: No focal liver abnormality is seen. Status post cholecystectomy. No biliary dilatation. Pancreas: Unremarkable. No pancreatic ductal dilatation or surrounding inflammatory changes. Spleen: Normal in size without focal abnormality. Adrenals/Urinary Tract: Adrenal glands are unremarkable. Kidneys are normal, without renal calculi, focal  lesion, or hydronephrosis. Bladder is unremarkable. Stomach/Bowel: Stomach is within normal limits. Appendix appears normal. No evidence of bowel wall thickening, distention, or inflammatory changes. Vascular/Lymphatic: No significant vascular findings are present. No enlarged abdominal or pelvic lymph nodes. Reproductive: Uterus is anteverted and is not enlarged. Simple appearing cyst on the right ovary measuring 3.3 cm diameter. Decreasing in size since previous study. Dermoid cyst in the left ovary measuring 3.4 cm diameter without change. Other: No free air or free fluid in the abdomen. Musculoskeletal: Infiltration in the subcutaneous fat lateral to the right pelvis consistent with soft tissue contusion. No loculated collections. No acute fractures identified. IMPRESSION: 1. Soft tissue infiltration consistent with contusion lateral to the right pelvis. 2. Simple appearing right ovarian cyst without change since previous study. Dermoid cyst in the left ovary without change. 3. No evidence of bowel obstruction or inflammation. Electronically Signed   By: Lucienne Capers M.D.   On: 09/20/2017 01:20    Zephaniah Enyeart, DO  [facility] Pager [phone]  If 7PM-7AM, please contact night-coverage [email or web address] Password [email or web address] 09/21/2017, 4:29 PM   LOS: 1 day

## 2017-09-22 ENCOUNTER — Inpatient Hospital Stay (HOSPITAL_COMMUNITY): Payer: Self-pay

## 2017-09-22 DIAGNOSIS — S301XXA Contusion of abdominal wall, initial encounter: Secondary | ICD-10-CM

## 2017-09-22 DIAGNOSIS — I361 Nonrheumatic tricuspid (valve) insufficiency: Secondary | ICD-10-CM

## 2017-09-22 LAB — CBC
HCT: 26.2 % — ABNORMAL LOW (ref 36.0–46.0)
Hemoglobin: 8.2 g/dL — ABNORMAL LOW (ref 12.0–15.0)
MCH: 26.4 pg (ref 26.0–34.0)
MCHC: 31.3 g/dL (ref 30.0–36.0)
MCV: 84.2 fL (ref 78.0–100.0)
PLATELETS: 33 10*3/uL — AB (ref 150–400)
RBC: 3.11 MIL/uL — ABNORMAL LOW (ref 3.87–5.11)
RDW: 21.6 % — AB (ref 11.5–15.5)
WBC: 5.2 10*3/uL (ref 4.0–10.5)

## 2017-09-22 LAB — MAGNESIUM: MAGNESIUM: 1.6 mg/dL — AB (ref 1.7–2.4)

## 2017-09-22 LAB — COMPREHENSIVE METABOLIC PANEL
ALT: 21 U/L (ref 14–54)
ANION GAP: 7 (ref 5–15)
AST: 65 U/L — ABNORMAL HIGH (ref 15–41)
Albumin: 3.2 g/dL — ABNORMAL LOW (ref 3.5–5.0)
Alkaline Phosphatase: 93 U/L (ref 38–126)
BUN: 5 mg/dL — ABNORMAL LOW (ref 6–20)
CHLORIDE: 103 mmol/L (ref 101–111)
CO2: 22 mmol/L (ref 22–32)
CREATININE: 0.3 mg/dL — AB (ref 0.44–1.00)
Calcium: 8 mg/dL — ABNORMAL LOW (ref 8.9–10.3)
Glucose, Bld: 89 mg/dL (ref 65–99)
POTASSIUM: 3.9 mmol/L (ref 3.5–5.1)
Sodium: 132 mmol/L — ABNORMAL LOW (ref 135–145)
Total Bilirubin: 3.7 mg/dL — ABNORMAL HIGH (ref 0.3–1.2)
Total Protein: 6.6 g/dL (ref 6.5–8.1)

## 2017-09-22 LAB — CULTURE, BLOOD (ROUTINE X 2): SPECIAL REQUESTS: ADEQUATE

## 2017-09-22 LAB — PATHOLOGIST SMEAR REVIEW

## 2017-09-22 LAB — ECHOCARDIOGRAM COMPLETE
Height: 61 in
WEIGHTICAEL: 2472.68 [oz_av]

## 2017-09-22 LAB — FACTOR 8 ASSAY: Coagulation Factor VIII: 93 % (ref 57–163)

## 2017-09-22 MED ORDER — MAGNESIUM SULFATE 2 GM/50ML IV SOLN
2.0000 g | Freq: Once | INTRAVENOUS | Status: AC
  Administered 2017-09-22: 2 g via INTRAVENOUS
  Filled 2017-09-22: qty 50

## 2017-09-22 MED ORDER — HYDROCORTISONE 2.5 % RE CREA
TOPICAL_CREAM | Freq: Four times a day (QID) | RECTAL | Status: DC
  Administered 2017-09-23: 10:00:00 via RECTAL
  Filled 2017-09-22: qty 28.35

## 2017-09-22 MED ORDER — HYDROCORTISONE 2.5 % RE CREA: TOPICAL_CREAM | Freq: Four times a day (QID) | RECTAL | Status: DC

## 2017-09-22 MED ORDER — DEXTROSE 5 % IV SOLN: 2.0000 g | INTRAVENOUS | 0 refills | Status: DC

## 2017-09-22 NOTE — Progress Notes (Addendum)
Referring Provider: No ref. provider found Primary Care Physician:  Patient, No Pcp Per Primary Gastroenterologist:  Barney Drain   Reason for Consultation: ETOHIC HEPATITIS/RECTAL BLEEDING   Impression: ADMITTED WITH GRP STREPTOOCCUS BACTEREMIA. 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  DISCRIMINANT FUNCTION 47.8.(>32). DENIES SMALL VOLUME HEMATOCHEZIA.  Plan: 1. SUPPORTIVE CARE 2. TRANSUSE TO KEEP Hb > 7.0. 1 u pRBCS TODAY. 3. NOT A CANDIDATE FOR STEROIDS DUE TO ONGOING INFECTION 4. CONTINUE ABX. ADD VITAMIN K 10 MG IV QD FOR 3 DAYS. 5. BID PPI  6. Not a candidate for liver transplant due to recent ETOH use. 7. CONTINUE TO MONITOR mental status. 8. CHANGE TO ANUSOL BID.  9. NO INDICATION FOR ENDOSCOPY AT THIS TIME. 10. DISCUSSED WITH PT IF SHE CONTINUES TO DRINK ETOH SHE WILL DIE. PT VOICED HER UNDERSTANDING. IF SHE WISHES TO RECEIVE GI CARE IN [place] SHE WILL LET us KNOW.      HPI:  PT HAS HISTORY OF ETOH ABUSE. ADMITTED MULTIPLE TIMES IN 2018 WITH RECTAL BLEEDING. FULL COLONOSCOPY JAN 2018: EXTERNAL AND INTERNAL HEMORRHOIDS. ADMITTED TO [facility] IN JUL 2018: EGD/FLEX SIG PERFORMED FOR RECTAL BLEEDING-NO VARICES AND EXTERNAL HEMORRHOIDS. SURGERY DECLINED TO PERFORM HEMORRHOIDECTOMY. D/C ON ANUSOL HC CREAM. Hb ON DAY OF DISCHARGE WAS 7.8.  PT REPORTS SHE HAS VAGINAL BLEEDING. SHE HAD A PICC LINE PLACED YESTERDAY AND SHE HAD HAD BLEEDING FROM THE PICC LINE. NURSING REPORTS DRESSING HAS BEEN CHANGED THREE TIMES SINCE IT WAS INSERTED. ABSTAINED FROM ETOH SINCE JUL 2018 BUT STARTED DRINKING AGAIN IN NO 2018. WAS HAVING FEVER AND CHILLS BUT NOT TODAY. FREQUENTLY DEALS WITH CONSTIPATION.  PT DENIES HEMATOCHEZIA, ausea, vomiting, melena, diarrhea, CHEST PAIN, SHORTNESS OF BREATH,  CHANGE IN BOWEL IN HABITS, abdominal pain, problems swallowing, problems with sedation, OR heartburn or indigestion.   Past Medical History:  Diagnosis Date   . Alcohol abuse   . Anemia   . Anxiety   . Hemorrhoid   . Hepatitis B   . Major depressive disorder   . PTSD (post-traumatic stress disorder)   . Substance abuse [facility])    Past Surgical History:  Procedure Laterality Date  . CESAREAN SECTION    . CHOLECYSTECTOMY    . MOUTH SURGERY    . TUBAL LIGATION Bilateral 07/22/2014   Procedure: POST PARTUM TUBAL LIGATION;  Surgeon: Woodroe Mode, MD;  Location: [facility];  Service: Gynecology;  Laterality: Bilateral;    Prior to Admission medications   Medication Sig Start Date End Date Taking? Authorizing Provider  potassium chloride (MICRO-K) 10 MEQ CR capsule Take 2 capsules by mouth daily. 04/28/17  Yes [provider]  propranolol (INDERAL) 20 MG tablet Take 20 mg by mouth 2 (two) times daily. 05/29/17  Yes [provider]  sertraline (ZOLOFT) 100 MG tablet Take 1 tablet (100 mg total) by mouth daily. For depression 02/05/16  Yes Lindell Spar I, NP  spironolactone (ALDACTONE) 100 MG tablet Take 100 mg by mouth daily. 04/28/17  Yes [provider]  thiamine 100 MG tablet Take 100 mg by mouth daily. 04/29/17  Yes [provider]  traZODone (DESYREL) 150 MG tablet Take 1 tablet (150 mg total) by mouth at bedtime. For insomnia 02/05/16  Yes Encarnacion Slates, NP    Current Facility-Administered Medications  Medication Dose Route Frequency Provider Last Rate Last Dose  . 0.9 % NaCl with KCl 20 mEq/ L  infusion   Intravenous Continuous Tat, David, MD 100 mL/hr at 09/22/17 0431    .  cefTRIAXone (ROCEPHIN) 2 g in dextrose 5 % 50 mL IVPB  2 g Intravenous Q24H Orson Eva, MD   Stopped at 09/21/17 2000  . feeding supplement (ENSURE ENLIVE) (ENSURE ENLIVE) liquid 237 mL  237 mL Oral BID BM Orson Eva, MD   237 mL at 09/22/17 0916  . folic acid (FOLVITE) tablet 1 mg  1 mg Oral Daily Oswald Hillock, MD   1 mg at 09/22/17 0916  . LORazepam (ATIVAN) tablet 1 mg  1 mg Oral Q6H PRN Oswald Hillock, MD       Or  . LORazepam (ATIVAN)  injection 1 mg  1 mg Intravenous Q6H PRN Oswald Hillock, MD      . multivitamin with minerals tablet 1 tablet  1 tablet Oral Daily Oswald Hillock, MD   1 tablet at 09/22/17 0916  . ondansetron (ZOFRAN) tablet 4 mg  4 mg Oral Q6H PRN Oswald Hillock, MD       Or  . ondansetron (ZOFRAN) injection 4 mg  4 mg Intravenous Q6H PRN Oswald Hillock, MD      . oxyCODONE (Oxy IR/ROXICODONE) immediate release tablet 5 mg  5 mg Oral Q4H PRN Oswald Hillock, MD   5 mg at 09/20/17 0853  . pantoprazole (PROTONIX) injection 40 mg  40 mg Intravenous Therisa Doyne, MD   40 mg at 09/22/17 0916  . sertraline (ZOLOFT) tablet 100 mg  100 mg Oral Daily Oswald Hillock, MD   100 mg at 09/22/17 0916  . thiamine (VITAMIN B-1) tablet 100 mg  100 mg Oral Daily Oswald Hillock, MD   100 mg at 09/22/17 1610   Or  . thiamine (B-1) injection 100 mg  100 mg Intravenous Daily Oswald Hillock, MD        Allergies as of 09/19/2017  . (No Known Allergies)   Family History  Problem Relation Age of Onset  . Diabetes Maternal Grandmother   . Diabetes Paternal Grandfather    Social History   Socioeconomic History  . Marital status: Single    Spouse name: Not on file  . Number of children: Not on file  . Years of education: Not on file  . Highest education level: Not on file  Social Needs  . Financial resource strain: Not on file  . Food insecurity - worry: Not on file  . Food insecurity - inability: Not on file  . Transportation needs - medical: Not on file  . Transportation needs - non-medical: Not on file  Occupational History  . Not on file  Tobacco Use  . Smoking status: Never Smoker  . Smokeless tobacco: Never Used  Substance and Sexual Activity  . Alcohol use: Yes    Comment: last drink was this am, drinks occassionaly  . Drug use: No  . Sexual activity: Yes    Birth control/protection: None  Other Topics Concern  . Not on file  Social History Narrative  . Not on file   Review of Systems: PER HPI OTHERWISE ALL  SYSTEMS ARE NEGATIVE.  Vitals: Blood pressure 139/78, pulse 62, temperature 98.5 F (36.9 C), temperature source Oral, resp. rate 19, height 5\' 1"  (1.549 m), weight 154 lb 8.7 oz (70.1 kg), last menstrual period 09/14/2017, SpO2 99 %.  Physical Exam: General:   Alert,  Well-developed, well-nourished, pleasant and cooperative in NAD Head:  Normocephalic and atraumatic. Eyes:  Sclera clear, icterus.   Conjunctiva pink. Mouth:  No lesions, dentition normal. Neck:  Supple; no masses. Lungs:  Clear throughout to auscultation.   No wheezes. No acute distress. Heart:  Regular rate and rhythm; no murmurs. Abdomen:  Soft, nontender and nondistended. No masses noted. Normal bowel sounds, without guarding, and without rebound.   Msk:  Symmetrical without gross deformities. Normal posture. Extremities:  Without edema. Neurologic:  Alert and  oriented x4;  grossly normal neurologically. Cervical Nodes:  No significant cervical adenopathy. Psych:  Alert and cooperative. Normal mood and affect.  Lab Results: Recent Labs    09/20/17 0742 09/21/17 0458 09/22/17 0719  WBC 5.0 4.8 5.2  HGB 8.1* 7.6* 8.2*  HCT 25.2* 24.4* 26.2*  PLT 16* 20* 33*   BMET Recent Labs    09/21/17 0458 09/22/17 0719  NA 134* 132*  K 3.5 3.9  CL 107 103  CO2 23 22  GLUCOSE 91 89  BUN 7 5*  CREATININE <0.30* 0.30*  CALCIUM 7.6* 8.0*   LFT Recent Labs    09/22/17 0719  PROT 6.6  ALBUMIN 3.2*  AST 65*  ALT 21  ALKPHOS 93  BILITOT 3.7*     Studies/Results: CT ABD/PELVIS NOV 21 R OVARIAN CYSTS, NO ACUTE PROCESS OR ASCITES   LOS: 2 days   Tamberly Pomplun  09/22/2017, 2:15 PM

## 2017-09-22 NOTE — Progress Notes (Signed)
PROGRESS NOTE  Victoria Gordon MPN:[id number] DOB: 1981-09-19 DOA: 09/19/2017 PCP: Patient, No Pcp Per  Brief History: 36 year old female with a history of alcoholic abuse with alcoholic liver injury, PTSD, depression presenting with 2-3-day history of right-sided abdominal pain and one day history of nausea and vomiting. The patient had previously drank on a daily basis for over 2 years, but reportedly stopped drinking on Mar 14, 2017. Since then, she has restarted it. She states that she only drinks 1 sixpack of beer per week. She could not tell me how long she has been drinking although it was previously documented from her previous records that she drank over 2 years. The patient had a mechanical fall on September 17, 2017 tripping over a speaker and falling onto her right side. She had another fall on September 18, 2017 falling in the bathtub. She denied any loss of consciousness. She had complained of subjective fevers and chills at home. She denied any headache, chest pain, shortness breath, cough, hemoptysis, hematemesis, hematochezia, melena.Alcohol level in the emergency department was 222. Since admission, the patient began having fevers up to 101.3 F.  The patient was started on empiric Cubicin and Zosyn.  Blood cultures grew group B streptococcus.  Antibiotics were de-escalated to ceftriaxone.  Assessment/Plan: Sepsis -due to bacteremia -Lactic acid 2.8>>>0.8 -Initially startedZosyn -Initially started Cubicin as patient developed hives to vancomycin -Urinalysis negative for pyuria -Chest x-ray negative for infiltrates -Follow blood cultures--group B streptococcus -Continue IV fluids-->saline lock with diet advancement -influenza pcr--neg  Group B Streptococcus Bacteremia -d/c zosyn and cubicin -continue ceftriaxone--tentative stop date October 04, 2017 -Echo--EF 60-65%, no WMA, mild MR, PAS P 47, no vegetation  Coagulopathy/low grade DIC -Likely  due to alcoholic liver disease and DIC -Hematology consult appreciated -INR 2.03, PTT 51 fibrinogen 183 -Low haptoglobin likely secondary to chronic liver disease; LDH normal -Monitor for signs of bleeding-->hematochezia  Hematochezia -GI consult -Hgb stable -baseline Hgb 8-9 -recent admit at OSH 05/11/17--hematochezia-->underwent EGD showing no varices but portal hypertensive gastropathy and flexible sigmoidoscopy showing external hemorrhoids. -Admitted to [facility] 2018 --felt to have hemorrhoidal bleed status post sclerotherapy and suture fixation of hemorrhoids EGD  Intractable vomiting -Likely degree of alcoholic gastritis and possibly esophagitis -continue PPI-->change to po -improved -lipase 28 -Advance to soft diet  Abdominal pain -September 20, 2017 CT abdomen pelvis--infiltration of subcutaneous fat on the lateral right pelvis consistent with soft tissue contusion -Likely secondary to mechanical fall -improved  Alcohol liver disease -Patient had liver biopsy April 01, 2017 -showed marked hepatocyte injury, mild steatohepatitis with extensive pericellular fibrosis stage 2 of 4 (no bridging fibrosis  Alcohol abuse -Alcohol withdrawal protocol -no signs of withdrawal  Thrombocytopenia -Likely secondary to alcoholic liver disease with acute worsening due to infectious process and low grade DIC -B12--924 -TSH/Free T4--suggestive of euthyroid sick syndrome  Hypokalemia/hypomagnesemia -Replete  Hyperammonemia -No altered mental status -Likely secondary to chronic liver disease     Disposition Plan: Home when cleared by GI Family Communication:No family present  Consultants:hematology, GI  Code Status: FULL   DVT Prophylaxis: SCDs   Procedures: As Listed in Progress Note Above  Antibiotics: Zosyn 11/20>>>11/21 cubicin 11/21x1 Ceftriaxone 11/21>>>>    Subjective: Patient is feeling better overall.  She had 2 small bloody bowel  movements today.  She denies any fevers, chills, chest pain, shortness of breath, dizziness, nausea, vomiting, abdominal pain.  Objective: Vitals:   09/21/17 1129 09/21/17 2223 09/22/17 0630 09/22/17 1030  BP: (!) 141/79 133/77 128/74 139/78  Pulse: 68 64 60 62  Resp: 18 18 20 19   Temp: 98.9 F (37.2 C) 98 F (36.7 C) 98.2 F (36.8 C) 98.5 F (36.9 C)  TempSrc: Oral Oral Oral Oral  SpO2: 100% 100% 100% 99%  Weight:      Height:        Intake/Output Summary (Last 24 hours) at 09/22/2017 1423 Last data filed at 09/22/2017 1246 Gross per 24 hour  Intake 1690 ml  Output 2850 ml  Net -1160 ml   Weight change:  Exam:   General:  Pt is alert, follows commands appropriately, not in acute distress  HEENT: No icterus, No thrush, No neck mass, [place]/AT  Cardiovascular: RRR, S1/S2, no rubs, no gallops  Respiratory: CTA bilaterally, no wheezing, no crackles, no rhonchi  Abdomen: Soft/+BS, non tender, non distended, no guarding  Extremities: No edema, No lymphangitis, No petechiae, No rashes, no synovitis   Data Reviewed: I have personally reviewed following labs and imaging studies Basic Metabolic Panel: Recent Labs  Lab 09/20/17 0005 09/20/17 0742 09/21/17 0458 09/22/17 0719  NA 137 134* 134* 132*  K 3.3* 3.1* 3.5 3.9  CL 104 104 107 103  CO2 25 21* 23 22  GLUCOSE 110* 115* 91 89  BUN 5* <5* 7 5*  CREATININE 0.33* 0.30* <0.30* 0.30*  CALCIUM 8.1* 7.3* 7.6* 8.0*  MG 1.5*  --   --  1.6*   Liver Function Tests: Recent Labs  Lab 09/20/17 0005 09/20/17 0742 09/21/17 0458 09/22/17 0719  AST 120* 93* 67* 65*  ALT 29 22 20 21   ALKPHOS 103 92 86 93  BILITOT 5.6* 5.1* 3.6* 3.7*  PROT 7.7 6.5 6.3* 6.6  ALBUMIN 3.7 3.1* 3.0* 3.2*   Recent Labs  Lab 09/20/17 0005  LIPASE 28   Recent Labs  Lab 09/20/17 0005  AMMONIA 51*   Coagulation Profile: Recent Labs  Lab 09/20/17 0005 09/21/17 0458  INR 1.85 2.03   CBC: Recent Labs  Lab 09/20/17 0005  09/20/17 0742 09/21/17 0458 09/22/17 0719  WBC 7.1 5.0 4.8 5.2  NEUTROABS 5.6  --   --   --   HGB 9.6* 8.1* 7.6* 8.2*  HCT 29.9* 25.2* 24.4* 26.2*  MCV 82.4 82.9 83.6 84.2  PLT 30* 16* 20* 33*   Cardiac Enzymes: Recent Labs  Lab 09/20/17 0936  CKTOTAL 157   BNP: Invalid input(s): POCBNP CBG: No results for input(s): GLUCAP in the last 168 hours. HbA1C: No results for input(s): HGBA1C in the last 72 hours. Urine analysis:    Component Value Date/Time   COLORURINE AMBER (A) 09/20/2017 0001   APPEARANCEUR HAZY (A) 09/20/2017 0001   LABSPEC 1.017 09/20/2017 0001   PHURINE 6.0 09/20/2017 0001   GLUCOSEU NEGATIVE 09/20/2017 0001   HGBUR SMALL (A) 09/20/2017 0001   BILIRUBINUR NEGATIVE 09/20/2017 0001   KETONESUR NEGATIVE 09/20/2017 0001   PROTEINUR 30 (A) 09/20/2017 0001   UROBILINOGEN 0.2 02/17/2014 1509   NITRITE NEGATIVE 09/20/2017 0001   LEUKOCYTESUR NEGATIVE 09/20/2017 0001   Sepsis Labs: @LABRCNTIP (procalcitonin:4,lacticidven:4) ) Recent Results (from the past 240 hour(s))  Urine culture     Status: Abnormal   Collection Time: 09/20/17 12:01 AM  Result Value Ref Range Status   Specimen Description URINE, CLEAN CATCH  Final   Special Requests Normal  Final   Culture MULTIPLE SPECIES PRESENT, SUGGEST RECOLLECTION (A)  Final   Report Status 09/21/2017 FINAL  Final  Culture, blood (routine x 2)  Status: Abnormal   Collection Time: 09/20/17 12:28 AM  Result Value Ref Range Status   Specimen Description BLOOD LEFT ARM  Final   Special Requests   Final    BOTTLES DRAWN AEROBIC AND ANAEROBIC Blood Culture adequate volume   Culture  Setup Time   Final    GRAM POSITIVE COCCI IN CHAINS AEROBIC BOTTLE ONLY Gram Stain Report Called to,Read Back By and Verified With: HAWKINS,M @ 1740 ON 11.21.18 BY BOWMAN,L CRITICAL RESULT CALLED TO, READ BACK BY AND VERIFIED WITH: L.Seay Pharm.D. 17:20 09/20/17 ([email or web address]) Performed at [facility], [place] [address],  [place], [place] [address]    Culture GROUP B STREP(S.AGALACTIAE)ISOLATED (A)  Final   Report Status 09/22/2017 FINAL  Final   Organism ID, Bacteria GROUP B STREP(S.AGALACTIAE)ISOLATED  Final      Susceptibility   Group b strep(s.agalactiae)isolated - MIC*    CLINDAMYCIN <=0.25 SENSITIVE Sensitive     AMPICILLIN <=0.25 SENSITIVE Sensitive     ERYTHROMYCIN 2 RESISTANT Resistant     VANCOMYCIN 0.5 SENSITIVE Sensitive     CEFTRIAXONE <=0.12 SENSITIVE Sensitive     LEVOFLOXACIN 1 SENSITIVE Sensitive     * GROUP B STREP(S.AGALACTIAE)ISOLATED  Blood Culture ID Panel (Reflexed)     Status: Abnormal   Collection Time: 09/20/17 12:28 AM  Result Value Ref Range Status   Enterococcus species NOT DETECTED NOT DETECTED Final   Listeria monocytogenes NOT DETECTED NOT DETECTED Final   Staphylococcus species NOT DETECTED NOT DETECTED Final   Staphylococcus aureus NOT DETECTED NOT DETECTED Final   Streptococcus species DETECTED (A) NOT DETECTED Final    Comment: CRITICAL RESULT CALLED TO, READ BACK BY AND VERIFIED WITH: L. Seay Pharm.D. 17:20 09/20/17 ([email or web address])    Streptococcus agalactiae DETECTED (A) NOT DETECTED Final    Comment: CRITICAL RESULT CALLED TO, READ BACK BY AND VERIFIED WITH: L. Seay Pharm.D. 17:20 09/20/17 ([email or web address])    Streptococcus pneumoniae NOT DETECTED NOT DETECTED Final   Streptococcus pyogenes NOT DETECTED NOT DETECTED Final   Acinetobacter baumannii NOT DETECTED NOT DETECTED Final   Enterobacteriaceae species NOT DETECTED NOT DETECTED Final   Enterobacter cloacae complex NOT DETECTED NOT DETECTED Final   Escherichia coli NOT DETECTED NOT DETECTED Final   Klebsiella oxytoca NOT DETECTED NOT DETECTED Final   Klebsiella pneumoniae NOT DETECTED NOT DETECTED Final   Proteus species NOT DETECTED NOT DETECTED Final   Serratia marcescens NOT DETECTED NOT DETECTED Final   Haemophilus influenzae NOT DETECTED NOT DETECTED Final   Neisseria meningitidis NOT DETECTED NOT DETECTED Final    Pseudomonas aeruginosa NOT DETECTED NOT DETECTED Final   Candida albicans NOT DETECTED NOT DETECTED Final   Candida glabrata NOT DETECTED NOT DETECTED Final   Candida krusei NOT DETECTED NOT DETECTED Final   Candida parapsilosis NOT DETECTED NOT DETECTED Final   Candida tropicalis NOT DETECTED NOT DETECTED Final    Comment: Performed at [facility], [place] [address], [place], [place] [address]  Culture, blood (routine x 2)     Status: None (Preliminary result)   Collection Time: 09/20/17 12:39 AM  Result Value Ref Range Status   Specimen Description BLOOD LEFT HAND  Final   Special Requests   Final    BOTTLES DRAWN AEROBIC AND ANAEROBIC Blood Culture adequate volume   Culture NO GROWTH 2 DAYS  Final   Report Status PENDING  Incomplete     Scheduled Meds: . feeding supplement (ENSURE ENLIVE)  237 mL Oral BID BM  . folic acid  1 mg Oral Daily  . multivitamin with minerals  1 tablet Oral Daily  . pantoprazole (PROTONIX) IV  40 mg Intravenous Q12H  . sertraline  100 mg Oral Daily  . thiamine  100 mg Oral Daily   Or  . thiamine  100 mg Intravenous Daily   Continuous Infusions: . 0.9 % NaCl with KCl 20 mEq / L 100 mL/hr at 09/22/17 0431  . cefTRIAXone (ROCEPHIN)  IV Stopped (09/21/17 2000)    Procedures/Studies: Dg Chest 2 View  Result Date: 09/20/2017 CLINICAL DATA:  Shortness of breath and cough since yesterday. EXAM: CHEST  2 VIEW COMPARISON:  06/09/2004 FINDINGS: The heart size and mediastinal contours are within normal limits. Both lungs are clear. The visualized skeletal structures are unremarkable. Surgical clips in the right upper quadrant. IMPRESSION: No active cardiopulmonary disease. Electronically Signed   By: Lucienne Capers M.D.   On: 09/20/2017 02:39   Ct Head Wo Contrast  Result Date: 09/20/2017 CLINICAL DATA:  36 year old female with head trauma. EXAM: CT HEAD WITHOUT CONTRAST CT CERVICAL SPINE WITHOUT CONTRAST TECHNIQUE: Multidetector CT imaging of the head  and cervical spine was performed following the standard protocol without intravenous contrast. Multiplanar CT image reconstructions of the cervical spine were also generated. COMPARISON:  Head CT dated 02/07/2016 FINDINGS: CT HEAD FINDINGS Brain: No evidence of acute infarction, hemorrhage, hydrocephalus, extra-axial collection or mass lesion/mass effect. Vascular: No hyperdense vessel or unexpected calcification. Skull: Normal. Negative for fracture or focal lesion. Sinuses/Orbits: Mild mucoperiosteal thickening of paranasal sinuses. No air-fluid level. The mastoid air cells are clear. Other: None CT CERVICAL SPINE FINDINGS Alignment: No acute subluxation. Mild reversal of normal cervical lordosis which may be positional or due to muscle spasm. Skull base and vertebrae: No acute fracture. No primary bone lesion or focal pathologic process. Soft tissues and spinal canal: No prevertebral fluid or swelling. No visible canal hematoma. Disc levels:  Mild degenerative changes and disc bulge at C5-C6. Upper chest: Negative. Other: None IMPRESSION: 1. Unremarkable noncontrast CT of the brain. 2. No acute/traumatic cervical spine pathology. Electronically Signed   By: Anner Crete M.D.   On: 09/20/2017 01:23   Ct Cervical Spine Wo Contrast  Result Date: 09/20/2017 CLINICAL DATA:  36 year old female with head trauma. EXAM: CT HEAD WITHOUT CONTRAST CT CERVICAL SPINE WITHOUT CONTRAST TECHNIQUE: Multidetector CT imaging of the head and cervical spine was performed following the standard protocol without intravenous contrast. Multiplanar CT image reconstructions of the cervical spine were also generated. COMPARISON:  Head CT dated 02/07/2016 FINDINGS: CT HEAD FINDINGS Brain: No evidence of acute infarction, hemorrhage, hydrocephalus, extra-axial collection or mass lesion/mass effect. Vascular: No hyperdense vessel or unexpected calcification. Skull: Normal. Negative for fracture or focal lesion. Sinuses/Orbits: Mild  mucoperiosteal thickening of paranasal sinuses. No air-fluid level. The mastoid air cells are clear. Other: None CT CERVICAL SPINE FINDINGS Alignment: No acute subluxation. Mild reversal of normal cervical lordosis which may be positional or due to muscle spasm. Skull base and vertebrae: No acute fracture. No primary bone lesion or focal pathologic process. Soft tissues and spinal canal: No prevertebral fluid or swelling. No visible canal hematoma. Disc levels:  Mild degenerative changes and disc bulge at C5-C6. Upper chest: Negative. Other: None IMPRESSION: 1. Unremarkable noncontrast CT of the brain. 2. No acute/traumatic cervical spine pathology. Electronically Signed   By: Anner Crete M.D.   On: 09/20/2017 01:23   US Abdomen Complete  Result Date: 09/20/2017 CLINICAL DATA:  Thrombocytopenia EXAM: ABDOMEN ULTRASOUND COMPLETE COMPARISON:  CT 09/20/2017 FINDINGS: Gallbladder: Prior cholecystectomy Common bile duct: Diameter: Dilated, 11 mm, stable dating back to 2017 compatible with post cholecystectomy state. Liver: No focal lesion identified. Within normal limits in parenchymal echogenicity. Portal vein is patent on color Doppler imaging with normal direction of blood flow towards the liver. IVC: No abnormality visualized. Pancreas: Visualized portion unremarkable. Spleen: Enlarged with a craniocaudal length of 14 cm and a splenic volume of 743 mL. Right Kidney: Length: 13.8 cm. Echogenicity within normal limits. No mass or hydronephrosis visualized. Left Kidney: Length: 15.6 cm. Echogenicity within normal limits. No mass or hydronephrosis visualized. Abdominal aorta: No aneurysm visualized. Other findings: None. IMPRESSION: Splenomegaly as above. Dilated common bile duct is stable since prior CTs dating back to 2017 compatible with post cholecystectomy state. Electronically Signed   By: Rolm Baptise M.D.   On: 09/20/2017 11:47   Ct Abdomen Pelvis W Contrast  Result Date: 09/20/2017 CLINICAL DATA:   Nausea and vomiting starting today. Abdominal pain. Slip and fall injury striking the lower back. EXAM: CT ABDOMEN AND PELVIS WITH CONTRAST TECHNIQUE: Multidetector CT imaging of the abdomen and pelvis was performed using the standard protocol following bolus administration of intravenous contrast. CONTRAST:  124mL ISOVUE-300 IOPAMIDOL (ISOVUE-300) INJECTION 61% COMPARISON:  11/22/2016 FINDINGS: Lower chest: Lung bases are clear. Hepatobiliary: No focal liver abnormality is seen. Status post cholecystectomy. No biliary dilatation. Pancreas: Unremarkable. No pancreatic ductal dilatation or surrounding inflammatory changes. Spleen: Normal in size without focal abnormality. Adrenals/Urinary Tract: Adrenal glands are unremarkable. Kidneys are normal, without renal calculi, focal lesion, or hydronephrosis. Bladder is unremarkable. Stomach/Bowel: Stomach is within normal limits. Appendix appears normal. No evidence of bowel wall thickening, distention, or inflammatory changes. Vascular/Lymphatic: No significant vascular findings are present. No enlarged abdominal or pelvic lymph nodes. Reproductive: Uterus is anteverted and is not enlarged. Simple appearing cyst on the right ovary measuring 3.3 cm diameter. Decreasing in size since previous study. Dermoid cyst in the left ovary measuring 3.4 cm diameter without change. Other: No free air or free fluid in the abdomen. Musculoskeletal: Infiltration in the subcutaneous fat lateral to the right pelvis consistent with soft tissue contusion. No loculated collections. No acute fractures identified. IMPRESSION: 1. Soft tissue infiltration consistent with contusion lateral to the right pelvis. 2. Simple appearing right ovarian cyst without change since previous study. Dermoid cyst in the left ovary without change. 3. No evidence of bowel obstruction or inflammation. Electronically Signed   By: Lucienne Capers M.D.   On: 09/20/2017 01:20    Quiera Diffee, DO  [facility] Pager [phone]  If 7PM-7AM, please contact night-coverage [email or web address] Password [email or web address] 09/22/2017, 2:23 PM   LOS: 2 days

## 2017-09-22 NOTE — Progress Notes (Signed)
*  PRELIMINARY RESULTS* Echocardiogram 2D Echocardiogram has been performed.  Leavy Cella 09/22/2017, 11:12 AM

## 2017-09-22 NOTE — Care Management Note (Addendum)
Case Management Note  Patient Details  Name: Victoria Gordon MRN: [id number] Date of Birth: 09/06/1981    Admitted with sepsis. Pt from home, ind. Has PCP, Dr. Lonn Georgia at [facility]. She is uninsured. She will need IV abx at DC. [facility] will provide charity for uninsured. Pt okay with this facility choice. [facility] rep, aware of referral and will pull pt info. PICC line has been placed. Pt will receive dose at hospital on 11/24 prior to DC and get first home dose on 11/25. RN to notify [facility] when pt is DC'd, may need to fax [place] orders. Pt states she has family member who is able and willing to assist with IV abx at home. [place] [facility] voucher placed on pt chart to assist with cost of medications.   RN aware of give at DC with any Rx other than IV's.             Expected Discharge Date:  09/21/17               Expected Discharge Plan:  [place]  In-House Referral:  NA  Discharge planning Services  CM Consult  Post Acute Care Choice:  Home Health Choice offered to:  Patient  HH Arranged:  RN, IV Antibiotics HH Agency:  [facility] [facility]  Status of Service:  Completed, signed off  Sherald Barge, RN 09/22/2017, 3:25 PM

## 2017-09-23 DIAGNOSIS — D696 Thrombocytopenia, unspecified: Secondary | ICD-10-CM

## 2017-09-23 DIAGNOSIS — K709 Alcoholic liver disease, unspecified: Secondary | ICD-10-CM

## 2017-09-23 LAB — CBC
HEMATOCRIT: 26.4 % — AB (ref 36.0–46.0)
HEMOGLOBIN: 8.3 g/dL — AB (ref 12.0–15.0)
MCH: 26.8 pg (ref 26.0–34.0)
MCHC: 31.4 g/dL (ref 30.0–36.0)
MCV: 85.2 fL (ref 78.0–100.0)
Platelets: 37 10*3/uL — ABNORMAL LOW (ref 150–400)
RBC: 3.1 MIL/uL — ABNORMAL LOW (ref 3.87–5.11)
RDW: 21.5 % — AB (ref 11.5–15.5)
WBC: 4.1 10*3/uL (ref 4.0–10.5)

## 2017-09-23 LAB — BASIC METABOLIC PANEL
Anion gap: 6 (ref 5–15)
BUN: 7 mg/dL (ref 6–20)
CHLORIDE: 102 mmol/L (ref 101–111)
CO2: 25 mmol/L (ref 22–32)
Calcium: 8.2 mg/dL — ABNORMAL LOW (ref 8.9–10.3)
Creatinine, Ser: 0.33 mg/dL — ABNORMAL LOW (ref 0.44–1.00)
GFR calc Af Amer: >60 mL/min (ref >60)
GFR calc non Af Amer: >60 mL/min (ref >60)
GLUCOSE: 87 mg/dL (ref 65–99)
POTASSIUM: 3.4 mmol/L — AB (ref 3.5–5.1)
Sodium: 133 mmol/L — ABNORMAL LOW (ref 135–145)

## 2017-09-23 LAB — MAGNESIUM: MAGNESIUM: 1.6 mg/dL — AB (ref 1.7–2.4)

## 2017-09-23 LAB — PREPARE RBC (CROSSMATCH)

## 2017-09-23 MED ORDER — HYDROCORTISONE 2.5 % RE CREA
TOPICAL_CREAM | Freq: Two times a day (BID) | RECTAL | Status: DC
  Administered 2017-09-23: 1 via RECTAL
  Administered 2017-09-24 – 2017-09-25 (×3): via RECTAL
  Filled 2017-09-23: qty 28.35

## 2017-09-23 MED ORDER — MAGNESIUM SULFATE 2 GM/50ML IV SOLN
2.0000 g | Freq: Once | INTRAVENOUS | Status: AC
  Administered 2017-09-23: 2 g via INTRAVENOUS
  Filled 2017-09-23: qty 50

## 2017-09-23 MED ORDER — DIPHENHYDRAMINE HCL 50 MG/ML IJ SOLN
INTRAMUSCULAR | Status: AC
  Administered 2017-09-23: 50 mg
  Filled 2017-09-23: qty 1

## 2017-09-23 MED ORDER — DIPHENHYDRAMINE HCL 50 MG/ML IJ SOLN
25.0000 mg | Freq: Once | INTRAMUSCULAR | Status: AC
  Administered 2017-09-23: 25 mg via INTRAVENOUS
  Filled 2017-09-23: qty 1

## 2017-09-23 MED ORDER — METHYLPREDNISOLONE SODIUM SUCC 125 MG IJ SOLR
125.0000 mg | Freq: Once | INTRAMUSCULAR | Status: AC
  Administered 2017-09-23: 125 mg via INTRAVENOUS
  Filled 2017-09-23: qty 2

## 2017-09-23 MED ORDER — POTASSIUM CHLORIDE CRYS ER 20 MEQ PO TBCR
20.0000 meq | EXTENDED_RELEASE_TABLET | Freq: Once | ORAL | Status: AC
  Administered 2017-09-23: 20 meq via ORAL
  Filled 2017-09-23: qty 1

## 2017-09-23 MED ORDER — VITAMIN K1 10 MG/ML IJ SOLN
10.0000 mg | Freq: Every day | INTRAVENOUS | Status: DC
  Administered 2017-09-23: 10 mg via INTRAVENOUS
  Filled 2017-09-23 (×3): qty 1

## 2017-09-23 MED ORDER — PANTOPRAZOLE SODIUM 40 MG PO TBEC
40.0000 mg | DELAYED_RELEASE_TABLET | Freq: Two times a day (BID) | ORAL | Status: DC
  Administered 2017-09-23 – 2017-09-26 (×6): 40 mg via ORAL
  Filled 2017-09-23 (×6): qty 1

## 2017-09-23 MED ORDER — SODIUM CHLORIDE 0.9 % IV SOLN
Freq: Once | INTRAVENOUS | Status: AC
  Administered 2017-09-23: 10:00:00 via INTRAVENOUS

## 2017-09-23 NOTE — Progress Notes (Addendum)
Called to see patient by nurse due to concerns of allergic reaction.  Vitamin K infusion had just finished within 5 minutes.  Patient received her PRBC which she finished approximately 1 hour prior to my notification. I arrived at the bedside.  The patient complains of some dyspnea, but she has not having distress.  She also complains of hives developing on her back, and bilateral upper extremities and shivering chills.  She denies any headache, chest pain, abdominal pain, nausea, vomiting.  VS--98.9--121--20--119/81--100% RA CV-RRR, no rub Lung--CTA, no wheeze, no stridor Abd--soft/NT+BS Hives on bilateral arms, back and neck  Concerned about hypersensitivity reaction to vitamin K. Benadryl 25 mg IV x 1 Solumedrol 125 mg IV x 1 pepcid IV x 1  DTat

## 2017-09-23 NOTE — Progress Notes (Addendum)
PROGRESS NOTE  Victoria Gordon LPF:[id number] DOB: 1981/02/13 DOA: 09/19/2017 PCP: Patient, No Pcp Per  Brief History: 36 year old female with a history of alcoholic abuse with alcoholic liver injury, PTSD, depression presenting with 2-3-day history of right-sided abdominal pain and one day history of nausea and vomiting. The patient had previously drank on a daily basis for over 2 years, but reportedly stopped drinking on Mar 14, 2017. Since then, she has restarted it. She states that she only drinks 1 sixpack of beer per week. She could not tell me how long she has been drinking although it was previously documented from her previous records that she drank over 2 years. The patient had a mechanical fall on September 17, 2017 tripping over a speaker and falling onto her right side. She had another fall on September 18, 2017 falling in the bathtub. She denied any loss of consciousness. She had complained of subjective fevers and chills at home. She denied any headache, chest pain, shortness breath, cough, hemoptysis, hematemesis, hematochezia, melena.Alcohol level in the emergency department was 222. Since admission, the patient began having fevers up to 101.3 F.The patient was started on empiric Cubicin and Zosyn. Blood cultures grew group B streptococcus. Antibiotics were de-escalated to ceftriaxone.  Assessment/Plan: Sepsis -due to bacteremia -Lactic acid 2.8>>>0.8 -Initially startedZosyn -Initially startedCubicin as patient developed hives to vancomycin -Urinalysis negative for pyuria -Chest x-ray negative for infiltrates -Follow blood cultures-->group B streptococcus -Continue IV fluids-->saline lock with diet advancement -influenza pcr--neg  Group B Streptococcus Bacteremia -d/c zosyn and cubicin -continue ceftriaxone--tentative stop date October 04, 2017 -Echo--EF 60-65%, no WMA, mild MR, PAS P 47, no vegetation  Hypersensitivity Reaction to IV  Vitamin K -11/24--hives and SOB with vitamin K infusion -improved with IV benadryl and solumedrol -D/C vitamin K  Coagulopathy/low grade DIC -Likely due to alcoholic liver diseaseand DIC -Hematologyconsult appreciated -INR 2.03, PTT36fibrinogen 183 -Low haptoglobin likely secondary to chronic liver disease;LDH normal -Monitor for signs of bleeding-->hematochezia  Hematochezia? -initially felt to be rectal blood, now pt feels this was vaginal from menses -GI consult appreciated -Hgb stable -baseline Hgb 8-9 -recent admit at OSH 05/11/17--hematochezia-->underwent EGD showing no varices but portal hypertensive gastropathy and flexible sigmoidoscopy showing external hemorrhoids. -[facility]felt to have hemorrhoidal bleed status post sclerotherapy and suture fixation of hemorrhoids EGD -1 unit PRBC per GI  Intractable vomiting -Likely degree of alcoholic gastritis and possibly esophagitis -continue PPI-->change to po -improved -lipase 28 -Advance to soft diet  Abdominal pain -September 20, 2017 CT abdomen pelvis--infiltration of subcutaneous fat on the lateral right pelvis consistent with soft tissue contusion -Likely secondary to mechanical fall -improved  Alcohol liver disease -Patient had liver biopsy April 01, 2017 -showed marked hepatocyte injury, mild steatohepatitis with extensive pericellular fibrosis stage 2 of 4 (no bridging fibrosis)  Alcohol abuse -Alcohol withdrawal protocol -no signs of withdrawal  Thrombocytopenia -Likely secondary to alcoholic liver disease with acute worsening due to infectious processand low grade DIC -improving with tx of infection -B12--924 -TSH/Free T4--suggestive of euthyroid sick syndrome  Hypokalemia/hypomagnesemia -Replete -give additional Kcl and Mag today  Hyperammonemia -No altered mental status -Likely secondary to chronic liver disease     Disposition Plan: Home when cleared by  GI Family Communication:No family present-Total time spent 35 minutes.  Greater than 50% spent face to face counseling and coordinating care.   Consultants:hematology, GI  Code Status: FULL   DVT Prophylaxis: SCDs   Procedures: As Listed in  Progress Note Above  Antibiotics: Zosyn 11/20>>>11/21 cubicin 11/21x1 Ceftriaxone 11/21>>>>       Subjective: Patient feels better after Benadryl and Solu-Medrol IV.  She is no longer short of breath.  She denies any fevers, chills, chest pain, nausea, vomiting, diarrhea, abdominal pain.  No dysuria hematuria.  No rectal bleeding today.  Objective: Vitals:   09/23/17 1115 09/23/17 1140 09/23/17 1345 09/23/17 1425  BP: 108/76 109/68 127/78 119/81  Pulse: 79 76 78 (!) 121  Resp: 18 18 18    Temp: 98.4 F (36.9 C) 98.5 F (36.9 C) 99.3 F (37.4 C) 98.9 F (37.2 C)  TempSrc: Oral Oral Oral Oral  SpO2: 100% 100% 100% 100%  Weight:      Height:        Intake/Output Summary (Last 24 hours) at 09/23/2017 1631 Last data filed at 09/23/2017 1340 Gross per 24 hour  Intake 975 ml  Output 1050 ml  Net -75 ml   Weight change:  Exam:   General:  Pt is alert, follows commands appropriately, not in acute distress  HEENT: No icterus, No thrush, No neck mass, [place]/AT  Cardiovascular: RRR, S1/S2, no rubs, no gallops  Respiratory: CTA bilaterally, no wheezing, no crackles, no rhonchi  Abdomen: Soft/+BS, non tender, non distended, no guarding  Extremities: No edema, No lymphangitis, No petechiae, No rashes, no synovitis   Data Reviewed: I have personally reviewed following labs and imaging studies Basic Metabolic Panel: Recent Labs  Lab 09/20/17 0005 09/20/17 0742 09/21/17 0458 09/22/17 0719 09/23/17 0630  NA 137 134* 134* 132* 133*  K 3.3* 3.1* 3.5 3.9 3.4*  CL 104 104 107 103 102  CO2 25 21* 23 22 25   GLUCOSE 110* 115* 91 89 87  BUN 5* <5* 7 5* 7  CREATININE 0.33* 0.30* <0.30* 0.30* 0.33*  CALCIUM 8.1*  7.3* 7.6* 8.0* 8.2*  MG 1.5*  --   --  1.6* 1.6*   Liver Function Tests: Recent Labs  Lab 09/20/17 0005 09/20/17 0742 09/21/17 0458 09/22/17 0719  AST 120* 93* 67* 65*  ALT 29 22 20 21   ALKPHOS 103 92 86 93  BILITOT 5.6* 5.1* 3.6* 3.7*  PROT 7.7 6.5 6.3* 6.6  ALBUMIN 3.7 3.1* 3.0* 3.2*   Recent Labs  Lab 09/20/17 0005  LIPASE 28   Recent Labs  Lab 09/20/17 0005  AMMONIA 51*   Coagulation Profile: Recent Labs  Lab 09/20/17 0005 09/21/17 0458  INR 1.85 2.03   CBC: Recent Labs  Lab 09/20/17 0005 09/20/17 0742 09/21/17 0458 09/22/17 0719 09/23/17 0630  WBC 7.1 5.0 4.8 5.2 4.1  NEUTROABS 5.6  --   --   --   --   HGB 9.6* 8.1* 7.6* 8.2* 8.3*  HCT 29.9* 25.2* 24.4* 26.2* 26.4*  MCV 82.4 82.9 83.6 84.2 85.2  PLT 30* 16* 20* 33* 37*   Cardiac Enzymes: Recent Labs  Lab 09/20/17 0936  CKTOTAL 157   BNP: Invalid input(s): POCBNP CBG: No results for input(s): GLUCAP in the last 168 hours. HbA1C: No results for input(s): HGBA1C in the last 72 hours. Urine analysis:    Component Value Date/Time   COLORURINE AMBER (A) 09/20/2017 0001   APPEARANCEUR HAZY (A) 09/20/2017 0001   LABSPEC 1.017 09/20/2017 0001   PHURINE 6.0 09/20/2017 0001   GLUCOSEU NEGATIVE 09/20/2017 0001   HGBUR SMALL (A) 09/20/2017 0001   BILIRUBINUR NEGATIVE 09/20/2017 0001   KETONESUR NEGATIVE 09/20/2017 0001   PROTEINUR 30 (A) 09/20/2017 0001   UROBILINOGEN 0.2 02/17/2014  [place] 09/20/2017 0001   LEUKOCYTESUR NEGATIVE 09/20/2017 0001   Sepsis Labs: @LABRCNTIP (procalcitonin:4,lacticidven:4) ) Recent Results (from the past 240 hour(s))  Urine culture     Status: Abnormal   Collection Time: 09/20/17 12:01 AM  Result Value Ref Range Status   Specimen Description URINE, CLEAN CATCH  Final   Special Requests Normal  Final   Culture MULTIPLE SPECIES PRESENT, SUGGEST RECOLLECTION (A)  Final   Report Status 09/21/2017 FINAL  Final  Culture, blood (routine x 2)      Status: Abnormal   Collection Time: 09/20/17 12:28 AM  Result Value Ref Range Status   Specimen Description BLOOD LEFT ARM  Final   Special Requests   Final    BOTTLES DRAWN AEROBIC AND ANAEROBIC Blood Culture adequate volume   Culture  Setup Time   Final    GRAM POSITIVE COCCI IN CHAINS AEROBIC BOTTLE ONLY Gram Stain Report Called to,Read Back By and Verified With: HAWKINS,M @ 1696 ON 11.21.18 BY BOWMAN,L CRITICAL RESULT CALLED TO, READ BACK BY AND VERIFIED WITH: L.Seay Pharm.D. 17:20 09/20/17 ([email or web address]) Performed at [facility], [place] [address], [place], [place] [address]    Culture GROUP B STREP(S.AGALACTIAE)ISOLATED (A)  Final   Report Status 09/22/2017 FINAL  Final   Organism ID, Bacteria GROUP B STREP(S.AGALACTIAE)ISOLATED  Final      Susceptibility   Group b strep(s.agalactiae)isolated - MIC*    CLINDAMYCIN <=0.25 SENSITIVE Sensitive     AMPICILLIN <=0.25 SENSITIVE Sensitive     ERYTHROMYCIN 2 RESISTANT Resistant     VANCOMYCIN 0.5 SENSITIVE Sensitive     CEFTRIAXONE <=0.12 SENSITIVE Sensitive     LEVOFLOXACIN 1 SENSITIVE Sensitive     * GROUP B STREP(S.AGALACTIAE)ISOLATED  Blood Culture ID Panel (Reflexed)     Status: Abnormal   Collection Time: 09/20/17 12:28 AM  Result Value Ref Range Status   Enterococcus species NOT DETECTED NOT DETECTED Final   Listeria monocytogenes NOT DETECTED NOT DETECTED Final   Staphylococcus species NOT DETECTED NOT DETECTED Final   Staphylococcus aureus NOT DETECTED NOT DETECTED Final   Streptococcus species DETECTED (A) NOT DETECTED Final    Comment: CRITICAL RESULT CALLED TO, READ BACK BY AND VERIFIED WITH: L. Seay Pharm.D. 17:20 09/20/17 ([email or web address])    Streptococcus agalactiae DETECTED (A) NOT DETECTED Final    Comment: CRITICAL RESULT CALLED TO, READ BACK BY AND VERIFIED WITH: L. Seay Pharm.D. 17:20 09/20/17 ([email or web address])    Streptococcus pneumoniae NOT DETECTED NOT DETECTED Final   Streptococcus pyogenes NOT DETECTED NOT DETECTED Final    Acinetobacter baumannii NOT DETECTED NOT DETECTED Final   Enterobacteriaceae species NOT DETECTED NOT DETECTED Final   Enterobacter cloacae complex NOT DETECTED NOT DETECTED Final   Escherichia coli NOT DETECTED NOT DETECTED Final   Klebsiella oxytoca NOT DETECTED NOT DETECTED Final   Klebsiella pneumoniae NOT DETECTED NOT DETECTED Final   Proteus species NOT DETECTED NOT DETECTED Final   Serratia marcescens NOT DETECTED NOT DETECTED Final   Haemophilus influenzae NOT DETECTED NOT DETECTED Final   Neisseria meningitidis NOT DETECTED NOT DETECTED Final   Pseudomonas aeruginosa NOT DETECTED NOT DETECTED Final   Candida albicans NOT DETECTED NOT DETECTED Final   Candida glabrata NOT DETECTED NOT DETECTED Final   Candida krusei NOT DETECTED NOT DETECTED Final   Candida parapsilosis NOT DETECTED NOT DETECTED Final   Candida tropicalis NOT DETECTED NOT DETECTED Final    Comment: Performed at [facility], [place] [address] [place], [place] [address]  Culture, blood (routine x 2)     Status: None (Preliminary result)   Collection Time: 09/20/17 12:39 AM  Result Value Ref Range Status   Specimen Description BLOOD LEFT HAND  Final   Special Requests   Final    BOTTLES DRAWN AEROBIC AND ANAEROBIC Blood Culture adequate volume   Culture NO GROWTH 3 DAYS  Final   Report Status PENDING  Incomplete     Scheduled Meds: . diphenhydrAMINE  25 mg Intravenous Once  . feeding supplement (ENSURE ENLIVE)  237 mL Oral BID BM  . folic acid  1 mg Oral Daily  . hydrocortisone   Rectal BID  . multivitamin with minerals  1 tablet Oral Daily  . pantoprazole  40 mg Oral BID AC  . sertraline  100 mg Oral Daily  . thiamine  100 mg Oral Daily   Or  . thiamine  100 mg Intravenous Daily   Continuous Infusions: . cefTRIAXone (ROCEPHIN)  IV Stopped (09/22/17 1815)    Procedures/Studies: Dg Chest 2 View  Result Date: 09/20/2017 CLINICAL DATA:  Shortness of breath and cough since yesterday. EXAM:  CHEST  2 VIEW COMPARISON:  06/09/2004 FINDINGS: The heart size and mediastinal contours are within normal limits. Both lungs are clear. The visualized skeletal structures are unremarkable. Surgical clips in the right upper quadrant. IMPRESSION: No active cardiopulmonary disease. Electronically Signed   By: Lucienne Capers M.D.   On: 09/20/2017 02:39   Ct Head Wo Contrast  Result Date: 09/20/2017 CLINICAL DATA:  36 year old female with head trauma. EXAM: CT HEAD WITHOUT CONTRAST CT CERVICAL SPINE WITHOUT CONTRAST TECHNIQUE: Multidetector CT imaging of the head and cervical spine was performed following the standard protocol without intravenous contrast. Multiplanar CT image reconstructions of the cervical spine were also generated. COMPARISON:  Head CT dated 02/07/2016 FINDINGS: CT HEAD FINDINGS Brain: No evidence of acute infarction, hemorrhage, hydrocephalus, extra-axial collection or mass lesion/mass effect. Vascular: No hyperdense vessel or unexpected calcification. Skull: Normal. Negative for fracture or focal lesion. Sinuses/Orbits: Mild mucoperiosteal thickening of paranasal sinuses. No air-fluid level. The mastoid air cells are clear. Other: None CT CERVICAL SPINE FINDINGS Alignment: No acute subluxation. Mild reversal of normal cervical lordosis which may be positional or due to muscle spasm. Skull base and vertebrae: No acute fracture. No primary bone lesion or focal pathologic process. Soft tissues and spinal canal: No prevertebral fluid or swelling. No visible canal hematoma. Disc levels:  Mild degenerative changes and disc bulge at C5-C6. Upper chest: Negative. Other: None IMPRESSION: 1. Unremarkable noncontrast CT of the brain. 2. No acute/traumatic cervical spine pathology. Electronically Signed   By: Anner Crete M.D.   On: 09/20/2017 01:23   Ct Cervical Spine Wo Contrast  Result Date: 09/20/2017 CLINICAL DATA:  36 year old female with head trauma. EXAM: CT HEAD WITHOUT CONTRAST CT  CERVICAL SPINE WITHOUT CONTRAST TECHNIQUE: Multidetector CT imaging of the head and cervical spine was performed following the standard protocol without intravenous contrast. Multiplanar CT image reconstructions of the cervical spine were also generated. COMPARISON:  Head CT dated 02/07/2016 FINDINGS: CT HEAD FINDINGS Brain: No evidence of acute infarction, hemorrhage, hydrocephalus, extra-axial collection or mass lesion/mass effect. Vascular: No hyperdense vessel or unexpected calcification. Skull: Normal. Negative for fracture or focal lesion. Sinuses/Orbits: Mild mucoperiosteal thickening of paranasal sinuses. No air-fluid level. The mastoid air cells are clear. Other: None CT CERVICAL SPINE FINDINGS Alignment: No acute subluxation. Mild reversal of normal cervical lordosis which may be positional or due to muscle spasm. Skull base  and vertebrae: No acute fracture. No primary bone lesion or focal pathologic process. Soft tissues and spinal canal: No prevertebral fluid or swelling. No visible canal hematoma. Disc levels:  Mild degenerative changes and disc bulge at C5-C6. Upper chest: Negative. Other: None IMPRESSION: 1. Unremarkable noncontrast CT of the brain. 2. No acute/traumatic cervical spine pathology. Electronically Signed   By: Anner Crete M.D.   On: 09/20/2017 01:23   US Abdomen Complete  Result Date: 09/20/2017 CLINICAL DATA:  Thrombocytopenia EXAM: ABDOMEN ULTRASOUND COMPLETE COMPARISON:  CT 09/20/2017 FINDINGS: Gallbladder: Prior cholecystectomy Common bile duct: Diameter: Dilated, 11 mm, stable dating back to 2017 compatible with post cholecystectomy state. Liver: No focal lesion identified. Within normal limits in parenchymal echogenicity. Portal vein is patent on color Doppler imaging with normal direction of blood flow towards the liver. IVC: No abnormality visualized. Pancreas: Visualized portion unremarkable. Spleen: Enlarged with a craniocaudal length of 14 cm and a splenic volume of  743 mL. Right Kidney: Length: 13.8 cm. Echogenicity within normal limits. No mass or hydronephrosis visualized. Left Kidney: Length: 15.6 cm. Echogenicity within normal limits. No mass or hydronephrosis visualized. Abdominal aorta: No aneurysm visualized. Other findings: None. IMPRESSION: Splenomegaly as above. Dilated common bile duct is stable since prior CTs dating back to 2017 compatible with post cholecystectomy state. Electronically Signed   By: Rolm Baptise M.D.   On: 09/20/2017 11:47   Ct Abdomen Pelvis W Contrast  Result Date: 09/20/2017 CLINICAL DATA:  Nausea and vomiting starting today. Abdominal pain. Slip and fall injury striking the lower back. EXAM: CT ABDOMEN AND PELVIS WITH CONTRAST TECHNIQUE: Multidetector CT imaging of the abdomen and pelvis was performed using the standard protocol following bolus administration of intravenous contrast. CONTRAST:  14mL ISOVUE-300 IOPAMIDOL (ISOVUE-300) INJECTION 61% COMPARISON:  11/22/2016 FINDINGS: Lower chest: Lung bases are clear. Hepatobiliary: No focal liver abnormality is seen. Status post cholecystectomy. No biliary dilatation. Pancreas: Unremarkable. No pancreatic ductal dilatation or surrounding inflammatory changes. Spleen: Normal in size without focal abnormality. Adrenals/Urinary Tract: Adrenal glands are unremarkable. Kidneys are normal, without renal calculi, focal lesion, or hydronephrosis. Bladder is unremarkable. Stomach/Bowel: Stomach is within normal limits. Appendix appears normal. No evidence of bowel wall thickening, distention, or inflammatory changes. Vascular/Lymphatic: No significant vascular findings are present. No enlarged abdominal or pelvic lymph nodes. Reproductive: Uterus is anteverted and is not enlarged. Simple appearing cyst on the right ovary measuring 3.3 cm diameter. Decreasing in size since previous study. Dermoid cyst in the left ovary measuring 3.4 cm diameter without change. Other: No free air or free fluid in the  abdomen. Musculoskeletal: Infiltration in the subcutaneous fat lateral to the right pelvis consistent with soft tissue contusion. No loculated collections. No acute fractures identified. IMPRESSION: 1. Soft tissue infiltration consistent with contusion lateral to the right pelvis. 2. Simple appearing right ovarian cyst without change since previous study. Dermoid cyst in the left ovary without change. 3. No evidence of bowel obstruction or inflammation. Electronically Signed   By: Lucienne Capers M.D.   On: 09/20/2017 01:20    Haile Toppins, DO  [facility] Pager [phone]  If 7PM-7AM, please contact night-coverage [email or web address] Password [email or web address] 09/23/2017, 4:31 PM   LOS: 3 days

## 2017-09-24 LAB — MAGNESIUM: Magnesium: 2 mg/dL (ref 1.7–2.4)

## 2017-09-24 LAB — BASIC METABOLIC PANEL
Anion gap: 6 (ref 5–15)
BUN: 9 mg/dL (ref 6–20)
CALCIUM: 9.2 mg/dL (ref 8.9–10.3)
CO2: 25 mmol/L (ref 22–32)
Chloride: 105 mmol/L (ref 101–111)
Creatinine, Ser: 0.32 mg/dL — ABNORMAL LOW (ref 0.44–1.00)
GFR calc Af Amer: >60 mL/min (ref >60)
GLUCOSE: 163 mg/dL — AB (ref 65–99)
Potassium: 3.9 mmol/L (ref 3.5–5.1)
Sodium: 136 mmol/L (ref 135–145)

## 2017-09-24 LAB — BPAM RBC
Blood Product Expiration Date: 201812122359
ISSUE DATE / TIME: 201811241110
UNIT TYPE AND RH: 5100

## 2017-09-24 LAB — CBC
HCT: 31.1 % — ABNORMAL LOW (ref 36.0–46.0)
Hemoglobin: 10 g/dL — ABNORMAL LOW (ref 12.0–15.0)
MCH: 27 pg (ref 26.0–34.0)
MCHC: 32.2 g/dL (ref 30.0–36.0)
MCV: 84.1 fL (ref 78.0–100.0)
PLATELETS: 50 10*3/uL — AB (ref 150–400)
RBC: 3.7 MIL/uL — ABNORMAL LOW (ref 3.87–5.11)
RDW: 21.2 % — AB (ref 11.5–15.5)
WBC: 6.9 10*3/uL (ref 4.0–10.5)

## 2017-09-24 LAB — TYPE AND SCREEN
ABO/RH(D): O POS
Antibody Screen: NEGATIVE
Unit division: 0

## 2017-09-24 MED ORDER — PANTOPRAZOLE SODIUM 40 MG PO TBEC: 40.0000 mg | DELAYED_RELEASE_TABLET | Freq: Two times a day (BID) | ORAL | 1 refills | Status: DC

## 2017-09-24 MED ORDER — LORAZEPAM 0.5 MG PO TABS
0.5000 mg | ORAL_TABLET | Freq: Two times a day (BID) | ORAL | Status: DC | PRN
  Administered 2017-09-24: 0.5 mg via ORAL
  Filled 2017-09-24: qty 1

## 2017-09-24 MED ORDER — ENSURE ENLIVE PO LIQD: 237.0000 mL | Freq: Two times a day (BID) | ORAL | 0 refills | Status: DC

## 2017-09-24 MED ORDER — SPIRONOLACTONE 100 MG PO TABS: 100.0000 mg | ORAL_TABLET | Freq: Every day | ORAL | 0 refills | Status: DC

## 2017-09-24 MED ORDER — HYDROCORTISONE 2.5 % RE CREA: TOPICAL_CREAM | Freq: Two times a day (BID) | RECTAL | 0 refills | Status: DC

## 2017-09-24 MED ORDER — FUROSEMIDE 20 MG PO TABS: 40.0000 mg | ORAL_TABLET | Freq: Every day | ORAL | 0 refills | Status: DC

## 2017-09-24 NOTE — Progress Notes (Signed)
Patient ID: Victoria Gordon, female   DOB: 18-Jul-1981, 36 y.o.   MRN: [id number]   Assessment/Plan: ADMITTED WITH GRP B BACTEREMIA AND PARTIALLY DECOMPENSATED LIVER DISEASE.  PLAN: 1. ATIVAN BID PRN ANXIETY 2. CONTINUE ANUSOL HC CREAM BID. 3. PROTONX BID.   Subjective: Since I last evaluated the patient HER PICC LINE WAS SWITCHED TO LUE. NO RECTAL BLEEDING OR MELENA. NO RECTAL PAIN.   Objective: Vital signs in last 24 hours: Vitals:   09/23/17 2035 09/24/17 0526  BP: (!) 151/62 118/64  Pulse: 78 78  Resp: 16 16  Temp: 98.6 F (37 C) 99.1 F (37.3 C)  SpO2: 100% 100%   General appearance: alert, cooperative and no distress Resp: clear to auscultation bilaterally Cardio: regular rate and rhythm GI: soft, non-tender; bowel sounds normal;  Lab Results:   PLT CT 50 Hb 10  Studies/Results: No results found.  Medications: I have reviewed the patient's current medications.   LOS: 5 days

## 2017-09-24 NOTE — Discharge Summary (Addendum)
Physician Discharge Summary  Victoria Gordon WVP:[id number] DOB: 11/06/1980 DOA: 09/19/2017  PCP: Patient, No Pcp Per  Admit date: 09/19/2017 Discharge date: 09/25/2017  ADDENDUM--discharge cancelled today due to bleeding from PICC line  Admitted From: Home Disposition:  Home  Recommendations for Outpatient Follow-up:  1. Follow up with PCP in 1-2 weeks 2. Please obtain BMP/CBC in one week   Home Health: YES Equipment/Devices: IV ceftriaxone through 10/03/17  Discharge Condition: Stable CODE STATUS: FULL Diet recommendation: Heart Healthy /   Brief/Interim Summary: 36 year old female with a history of alcoholic abuse with alcoholic liver injury, PTSD, depression presenting with 2-3-day history of right-sided abdominal pain and one day history of nausea and vomiting. The patient had previously drank on a daily basis for over 2 years, but reportedly stopped drinking on Mar 14, 2017. Since then, she has restarted it. She states that she only drinks 1 sixpack of beer per week. She could not tell me how long she has been drinking although it was previously documented from her previous records that she drank over 2 years. The patient had a mechanical fall on September 17, 2017 tripping over a speaker and falling onto her right side. She had another fall on September 18, 2017 falling in the bathtub. She denied any loss of consciousness. She had complained of subjective fevers and chills at home. She denied any headache, chest pain, shortness breath, cough, hemoptysis, hematemesis, hematochezia, melena.Alcohol level in the emergency department was 222. Since admission, the patient began having fevers up to 101.3 F.The patient was started on empiric Cubicin and Zosyn. Blood cultures grew group B streptococcus. Antibiotics were de-escalated to ceftriaxone.  She subsequently developed what she initially thought to be hematochezia.  She subsequently realized that she was starting  her menses.  Nevertheless, GI was consulted.  They did not feel any further invasive intervention was warranted.  Her Hgb remained stable.  GI recommended transfusing one unit PRBCs.  A PICC line was placed for home IV abx.    Discharge Diagnoses:  Sepsis -due to bacteremia -Lactic acid 2.8>>>0.8 -Initially startedZosyn -Initially startedCubicin as patient developed hives to vancomycin -Urinalysis negative for pyuria -Chest x-ray negative for infiltrates -Follow blood cultures-->group B streptococcus -Continue IV fluids-->saline lock with diet advancement -influenza pcr--neg  Group B Streptococcus Bacteremia -d/c zosyn and cubicin -continueceftriaxone--tentative stop date October 04, 2017 (14 days tx) -Echo--EF 60-65%, no WMA, mild MR, PAS P 47, no vegetation  Hypersensitivity Reaction to IV Vitamin K -11/24--hives and SOB with vitamin K infusion -improved with IV benadryl and solumedrol -D/C vitamin K  Coagulopathy/low grade DIC -Likely due to alcoholic liver diseaseand DIC -Hematologyconsult appreciated -INR 2.03, PTT35fibrinogen 183 -Low haptoglobin likely secondary to chronic liver disease;LDH normal -Monitor for signs of bleeding-->hematochezia? -mild bleeding at PICC line site-->pressure dressing-->improved  Hematochezia? -initially felt to be rectal blood, now pt feels this was vaginal from menses -GI consultappreciated -Hgb stable -baseline Hgb 8-9 -recent admit at OSH 05/11/17--hematochezia-->underwent EGD showing no varices but portal hypertensive gastropathy and flexible sigmoidoscopy showing external hemorrhoids. -[facility] --felt tohavehemorrhoidal bleed status post sclerotherapy and suture fixation of hemorrhoids EGD -1 unit PRBC per GI--11/24 -Hgb 8.7 on day of discharge  Intractable vomiting -Likely degree of alcoholic gastritis and possibly esophagitis -continuePPI-->change to po -improved -lipase 28 -Advanced  tosoft diet which the patient tolerated  Abdominal pain -September 20, 2017 CT abdomen pelvis--infiltration of subcutaneous fat on the lateral right pelvis consistent with soft tissue contusion -Likely secondary to mechanical fall -improved  Alcohol liver disease -Patient had liver biopsy June2,2018 -showed marked hepatocyte injury, mild steatohepatitis with extensive pericellular fibrosis stage 2 of 4 (no bridging fibrosis)  Alcohol abuse -Alcohol withdrawal protocol -no signs of withdrawal  Thrombocytopenia -Likely secondary to alcoholic liver disease with acute worsening due to infectious processand low grade DIC -improving with tx of infection--platelets 51K on day of d/c -B12--924 -TSH/Free T4--suggestive of euthyroid sick syndrome  Hypokalemia/hypomagnesemia -Repleted  Hyperammonemia -No altered mental status -Likely secondary to chronic liver disease     Discharge Instructions  Discharge Instructions    Home infusion instructions [facility] May follow [place] Dosing Protocol; May administer Cathflo as needed to maintain patency of vascular access device.; Flushing of vascular access device: per [facility] Protocol: 0.9% NaCl pre/post medica...   Complete by:  As directed    Instructions:  May follow [place] Dosing Protocol   Instructions:  May administer Cathflo as needed to maintain patency of vascular access device.   Instructions:  Flushing of vascular access device: per [facility] Protocol: 0.9% NaCl pre/post medication administration and prn patency; Heparin 100 u/ml, 34ml for implanted ports and Heparin 10u/ml, 21ml for all other central venous catheters.   Instructions:  May follow [facility] Anaphylaxis Protocol for First Dose Administration in the home: 0.9% NaCl at 25-50 ml/hr to maintain IV access for protocol meds. Epinephrine 0.3 ml IV/IM PRN and Benadryl 25-50 IV/IM PRN s/s of anaphylaxis.   Instructions:  [place] Infusion Coordinator (RN)  to assist per patient IV care needs in the home PRN.     Allergies as of 09/24/2017      Reactions   Vitamin K And Related Hives, Shortness Of Breath, Itching, Rash   Vancomycin Hives, Itching      Medication List    STOP taking these medications   traZODone 150 MG tablet Commonly known as:  DESYREL     TAKE these medications   cefTRIAXone 2 g in dextrose 5 % 50 mL Inject 2 g into the vein daily. Last dose on 10/03/2017   feeding supplement (ENSURE ENLIVE) Liqd Take 237 mLs by mouth 2 (two) times daily between meals. Start taking on:  09/25/2017   furosemide 20 MG tablet Commonly known as:  LASIX Take 2 tablets (40 mg total) by mouth daily.   hydrocortisone 2.5 % rectal cream Commonly known as:  ANUSOL-HC Place rectally 2 (two) times daily at 10 AM and 5 PM.   pantoprazole 40 MG tablet Commonly known as:  PROTONIX Take 1 tablet (40 mg total) by mouth 2 (two) times daily before a meal.   potassium chloride 10 MEQ CR capsule Commonly known as:  MICRO-K Take 2 capsules by mouth daily.   propranolol 20 MG tablet Commonly known as:  INDERAL Take 20 mg by mouth 2 (two) times daily.   sertraline 100 MG tablet Commonly known as:  ZOLOFT Take 1 tablet (100 mg total) by mouth daily. For depression   spironolactone 100 MG tablet Commonly known as:  ALDACTONE Take 1 tablet (100 mg total) by mouth daily.   thiamine 100 MG tablet Take 100 mg by mouth daily.            Home Infusion Instuctions  (From admission, onward)        Start     Ordered   09/22/17 0000  Home infusion instructions [facility] May follow [place] Dosing Protocol; May administer Cathflo as needed to maintain patency of vascular access device.; Flushing of  vascular access device: per [facility] Protocol: 0.9% NaCl pre/post medica...    Question Answer Comment  Instructions May follow [facility] Dosing Protocol   Instructions May administer Cathflo as needed to maintain patency of  vascular access device.   Instructions Flushing of vascular access device: per [facility] Protocol: 0.9% NaCl pre/post medication administration and prn patency; Heparin 100 u/ml, 12ml for implanted ports and Heparin 10u/ml, 85ml for all other central venous catheters.   Instructions May follow [facility] Anaphylaxis Protocol for First Dose Administration in the home: 0.9% NaCl at 25-50 ml/hr to maintain IV access for protocol meds. Epinephrine 0.3 ml IV/IM PRN and Benadryl 25-50 IV/IM PRN s/s of anaphylaxis.   Instructions [facility] Infusion Coordinator (RN) to assist per patient IV care needs in the home PRN.      09/22/17 1544     Follow-up Information    [facility]-Home Follow up.   Specialty:  Home Health Services Contact information: [place] [id number] [phone]          Allergies  Allergen Reactions  . Vitamin K And Related Hives, Shortness Of Breath, Itching and Rash  . Vancomycin Hives and Itching    Consultations:  GI--Fields   Procedures/Studies: Dg Chest 2 View  Result Date: 09/20/2017 CLINICAL DATA:  Shortness of breath and cough since yesterday. EXAM: CHEST  2 VIEW COMPARISON:  06/09/2004 FINDINGS: The heart size and mediastinal contours are within normal limits. Both lungs are clear. The visualized skeletal structures are unremarkable. Surgical clips in the right upper quadrant. IMPRESSION: No active cardiopulmonary disease. Electronically Signed   By: Lucienne Capers M.D.   On: 09/20/2017 02:39   Ct Head Wo Contrast  Result Date: 09/20/2017 CLINICAL DATA:  36 year old female with head trauma. EXAM: CT HEAD WITHOUT CONTRAST CT CERVICAL SPINE WITHOUT CONTRAST TECHNIQUE: Multidetector CT imaging of the head and cervical spine was performed following the standard protocol without intravenous contrast. Multiplanar CT image reconstructions of the cervical spine were also generated. COMPARISON:  Head CT dated 02/07/2016 FINDINGS:  CT HEAD FINDINGS Brain: No evidence of acute infarction, hemorrhage, hydrocephalus, extra-axial collection or mass lesion/mass effect. Vascular: No hyperdense vessel or unexpected calcification. Skull: Normal. Negative for fracture or focal lesion. Sinuses/Orbits: Mild mucoperiosteal thickening of paranasal sinuses. No air-fluid level. The mastoid air cells are clear. Other: None CT CERVICAL SPINE FINDINGS Alignment: No acute subluxation. Mild reversal of normal cervical lordosis which may be positional or due to muscle spasm. Skull base and vertebrae: No acute fracture. No primary bone lesion or focal pathologic process. Soft tissues and spinal canal: No prevertebral fluid or swelling. No visible canal hematoma. Disc levels:  Mild degenerative changes and disc bulge at C5-C6. Upper chest: Negative. Other: None IMPRESSION: 1. Unremarkable noncontrast CT of the brain. 2. No acute/traumatic cervical spine pathology. Electronically Signed   By: Anner Crete M.D.   On: 09/20/2017 01:23   Ct Cervical Spine Wo Contrast  Result Date: 09/20/2017 CLINICAL DATA:  35 year old female with head trauma. EXAM: CT HEAD WITHOUT CONTRAST CT CERVICAL SPINE WITHOUT CONTRAST TECHNIQUE: Multidetector CT imaging of the head and cervical spine was performed following the standard protocol without intravenous contrast. Multiplanar CT image reconstructions of the cervical spine were also generated. COMPARISON:  Head CT dated 02/07/2016 FINDINGS: CT HEAD FINDINGS Brain: No evidence of acute infarction, hemorrhage, hydrocephalus, extra-axial collection or mass lesion/mass effect. Vascular: No hyperdense vessel or unexpected calcification. Skull: Normal. Negative for fracture or focal lesion. Sinuses/Orbits:  Mild mucoperiosteal thickening of paranasal sinuses. No air-fluid level. The mastoid air cells are clear. Other: None CT CERVICAL SPINE FINDINGS Alignment: No acute subluxation. Mild reversal of normal cervical lordosis which may  be positional or due to muscle spasm. Skull base and vertebrae: No acute fracture. No primary bone lesion or focal pathologic process. Soft tissues and spinal canal: No prevertebral fluid or swelling. No visible canal hematoma. Disc levels:  Mild degenerative changes and disc bulge at C5-C6. Upper chest: Negative. Other: None IMPRESSION: 1. Unremarkable noncontrast CT of the brain. 2. No acute/traumatic cervical spine pathology. Electronically Signed   By: Anner Crete M.D.   On: 09/20/2017 01:23   US Abdomen Complete  Result Date: 09/20/2017 CLINICAL DATA:  Thrombocytopenia EXAM: ABDOMEN ULTRASOUND COMPLETE COMPARISON:  CT 09/20/2017 FINDINGS: Gallbladder: Prior cholecystectomy Common bile duct: Diameter: Dilated, 11 mm, stable dating back to 2017 compatible with post cholecystectomy state. Liver: No focal lesion identified. Within normal limits in parenchymal echogenicity. Portal vein is patent on color Doppler imaging with normal direction of blood flow towards the liver. IVC: No abnormality visualized. Pancreas: Visualized portion unremarkable. Spleen: Enlarged with a craniocaudal length of 14 cm and a splenic volume of 743 mL. Right Kidney: Length: 13.8 cm. Echogenicity within normal limits. No mass or hydronephrosis visualized. Left Kidney: Length: 15.6 cm. Echogenicity within normal limits. No mass or hydronephrosis visualized. Abdominal aorta: No aneurysm visualized. Other findings: None. IMPRESSION: Splenomegaly as above. Dilated common bile duct is stable since prior CTs dating back to 2017 compatible with post cholecystectomy state. Electronically Signed   By: Rolm Baptise M.D.   On: 09/20/2017 11:47   Ct Abdomen Pelvis W Contrast  Result Date: 09/20/2017 CLINICAL DATA:  Nausea and vomiting starting today. Abdominal pain. Slip and fall injury striking the lower back. EXAM: CT ABDOMEN AND PELVIS WITH CONTRAST TECHNIQUE: Multidetector CT imaging of the abdomen and pelvis was performed using  the standard protocol following bolus administration of intravenous contrast. CONTRAST:  168mL ISOVUE-300 IOPAMIDOL (ISOVUE-300) INJECTION 61% COMPARISON:  11/22/2016 FINDINGS: Lower chest: Lung bases are clear. Hepatobiliary: No focal liver abnormality is seen. Status post cholecystectomy. No biliary dilatation. Pancreas: Unremarkable. No pancreatic ductal dilatation or surrounding inflammatory changes. Spleen: Normal in size without focal abnormality. Adrenals/Urinary Tract: Adrenal glands are unremarkable. Kidneys are normal, without renal calculi, focal lesion, or hydronephrosis. Bladder is unremarkable. Stomach/Bowel: Stomach is within normal limits. Appendix appears normal. No evidence of bowel wall thickening, distention, or inflammatory changes. Vascular/Lymphatic: No significant vascular findings are present. No enlarged abdominal or pelvic lymph nodes. Reproductive: Uterus is anteverted and is not enlarged. Simple appearing cyst on the right ovary measuring 3.3 cm diameter. Decreasing in size since previous study. Dermoid cyst in the left ovary measuring 3.4 cm diameter without change. Other: No free air or free fluid in the abdomen. Musculoskeletal: Infiltration in the subcutaneous fat lateral to the right pelvis consistent with soft tissue contusion. No loculated collections. No acute fractures identified. IMPRESSION: 1. Soft tissue infiltration consistent with contusion lateral to the right pelvis. 2. Simple appearing right ovarian cyst without change since previous study. Dermoid cyst in the left ovary without change. 3. No evidence of bowel obstruction or inflammation. Electronically Signed   By: Lucienne Capers M.D.   On: 09/20/2017 01:20        Discharge Exam: Vitals:   09/24/17 0526 09/24/17 1359  BP: 118/64 109/74  Pulse: 78 97  Resp: 16 18  Temp: 99.1 F (37.3 C) 99.1 F (37.3 C)  SpO2: 100% 100%   Vitals:   09/23/17 1425 09/23/17 2035 09/24/17 0526 09/24/17 1359  BP: 119/81  (!) 151/62 118/64 109/74  Pulse: (!) 121 78 78 97  Resp:  16 16 18   Temp: 98.9 F (37.2 C) 98.6 F (37 C) 99.1 F (37.3 C) 99.1 F (37.3 C)  TempSrc: Oral Oral Oral Oral  SpO2: 100% 100% 100% 100%  Weight:      Height:        General: Pt is alert, awake, not in acute distress Cardiovascular: RRR, S1/S2 +, no rubs, no gallops Respiratory: CTA bilaterally, no wheezing, no rhonchi Abdominal: Soft, NT, ND, bowel sounds + Extremities: no edema, no cyanosis   The results of significant diagnostics from this hospitalization (including imaging, microbiology, ancillary and laboratory) are listed below for reference.    Significant Diagnostic Studies: Dg Chest 2 View  Result Date: 09/20/2017 CLINICAL DATA:  Shortness of breath and cough since yesterday. EXAM: CHEST  2 VIEW COMPARISON:  06/09/2004 FINDINGS: The heart size and mediastinal contours are within normal limits. Both lungs are clear. The visualized skeletal structures are unremarkable. Surgical clips in the right upper quadrant. IMPRESSION: No active cardiopulmonary disease. Electronically Signed   By: Lucienne Capers M.D.   On: 09/20/2017 02:39   Ct Head Wo Contrast  Result Date: 09/20/2017 CLINICAL DATA:  36 year old female with head trauma. EXAM: CT HEAD WITHOUT CONTRAST CT CERVICAL SPINE WITHOUT CONTRAST TECHNIQUE: Multidetector CT imaging of the head and cervical spine was performed following the standard protocol without intravenous contrast. Multiplanar CT image reconstructions of the cervical spine were also generated. COMPARISON:  Head CT dated 02/07/2016 FINDINGS: CT HEAD FINDINGS Brain: No evidence of acute infarction, hemorrhage, hydrocephalus, extra-axial collection or mass lesion/mass effect. Vascular: No hyperdense vessel or unexpected calcification. Skull: Normal. Negative for fracture or focal lesion. Sinuses/Orbits: Mild mucoperiosteal thickening of paranasal sinuses. No air-fluid level. The mastoid air cells are  clear. Other: None CT CERVICAL SPINE FINDINGS Alignment: No acute subluxation. Mild reversal of normal cervical lordosis which may be positional or due to muscle spasm. Skull base and vertebrae: No acute fracture. No primary bone lesion or focal pathologic process. Soft tissues and spinal canal: No prevertebral fluid or swelling. No visible canal hematoma. Disc levels:  Mild degenerative changes and disc bulge at C5-C6. Upper chest: Negative. Other: None IMPRESSION: 1. Unremarkable noncontrast CT of the brain. 2. No acute/traumatic cervical spine pathology. Electronically Signed   By: Anner Crete M.D.   On: 09/20/2017 01:23   Ct Cervical Spine Wo Contrast  Result Date: 09/20/2017 CLINICAL DATA:  36 year old female with head trauma. EXAM: CT HEAD WITHOUT CONTRAST CT CERVICAL SPINE WITHOUT CONTRAST TECHNIQUE: Multidetector CT imaging of the head and cervical spine was performed following the standard protocol without intravenous contrast. Multiplanar CT image reconstructions of the cervical spine were also generated. COMPARISON:  Head CT dated 02/07/2016 FINDINGS: CT HEAD FINDINGS Brain: No evidence of acute infarction, hemorrhage, hydrocephalus, extra-axial collection or mass lesion/mass effect. Vascular: No hyperdense vessel or unexpected calcification. Skull: Normal. Negative for fracture or focal lesion. Sinuses/Orbits: Mild mucoperiosteal thickening of paranasal sinuses. No air-fluid level. The mastoid air cells are clear. Other: None CT CERVICAL SPINE FINDINGS Alignment: No acute subluxation. Mild reversal of normal cervical lordosis which may be positional or due to muscle spasm. Skull base and vertebrae: No acute fracture. No primary bone lesion or focal pathologic process. Soft tissues and spinal canal: No prevertebral fluid or swelling. No visible canal hematoma. Disc levels:  Mild degenerative changes and disc bulge at C5-C6. Upper chest: Negative. Other: None IMPRESSION: 1. Unremarkable  noncontrast CT of the brain. 2. No acute/traumatic cervical spine pathology. Electronically Signed   By: Anner Crete M.D.   On: 09/20/2017 01:23   US Abdomen Complete  Result Date: 09/20/2017 CLINICAL DATA:  Thrombocytopenia EXAM: ABDOMEN ULTRASOUND COMPLETE COMPARISON:  CT 09/20/2017 FINDINGS: Gallbladder: Prior cholecystectomy Common bile duct: Diameter: Dilated, 11 mm, stable dating back to 2017 compatible with post cholecystectomy state. Liver: No focal lesion identified. Within normal limits in parenchymal echogenicity. Portal vein is patent on color Doppler imaging with normal direction of blood flow towards the liver. IVC: No abnormality visualized. Pancreas: Visualized portion unremarkable. Spleen: Enlarged with a craniocaudal length of 14 cm and a splenic volume of 743 mL. Right Kidney: Length: 13.8 cm. Echogenicity within normal limits. No mass or hydronephrosis visualized. Left Kidney: Length: 15.6 cm. Echogenicity within normal limits. No mass or hydronephrosis visualized. Abdominal aorta: No aneurysm visualized. Other findings: None. IMPRESSION: Splenomegaly as above. Dilated common bile duct is stable since prior CTs dating back to 2017 compatible with post cholecystectomy state. Electronically Signed   By: Rolm Baptise M.D.   On: 09/20/2017 11:47   Ct Abdomen Pelvis W Contrast  Result Date: 09/20/2017 CLINICAL DATA:  Nausea and vomiting starting today. Abdominal pain. Slip and fall injury striking the lower back. EXAM: CT ABDOMEN AND PELVIS WITH CONTRAST TECHNIQUE: Multidetector CT imaging of the abdomen and pelvis was performed using the standard protocol following bolus administration of intravenous contrast. CONTRAST:  166mL ISOVUE-300 IOPAMIDOL (ISOVUE-300) INJECTION 61% COMPARISON:  11/22/2016 FINDINGS: Lower chest: Lung bases are clear. Hepatobiliary: No focal liver abnormality is seen. Status post cholecystectomy. No biliary dilatation. Pancreas: Unremarkable. No pancreatic  ductal dilatation or surrounding inflammatory changes. Spleen: Normal in size without focal abnormality. Adrenals/Urinary Tract: Adrenal glands are unremarkable. Kidneys are normal, without renal calculi, focal lesion, or hydronephrosis. Bladder is unremarkable. Stomach/Bowel: Stomach is within normal limits. Appendix appears normal. No evidence of bowel wall thickening, distention, or inflammatory changes. Vascular/Lymphatic: No significant vascular findings are present. No enlarged abdominal or pelvic lymph nodes. Reproductive: Uterus is anteverted and is not enlarged. Simple appearing cyst on the right ovary measuring 3.3 cm diameter. Decreasing in size since previous study. Dermoid cyst in the left ovary measuring 3.4 cm diameter without change. Other: No free air or free fluid in the abdomen. Musculoskeletal: Infiltration in the subcutaneous fat lateral to the right pelvis consistent with soft tissue contusion. No loculated collections. No acute fractures identified. IMPRESSION: 1. Soft tissue infiltration consistent with contusion lateral to the right pelvis. 2. Simple appearing right ovarian cyst without change since previous study. Dermoid cyst in the left ovary without change. 3. No evidence of bowel obstruction or inflammation. Electronically Signed   By: Lucienne Capers M.D.   On: 09/20/2017 01:20     Microbiology: Recent Results (from the past 240 hour(s))  Urine culture     Status: Abnormal   Collection Time: 09/20/17 12:01 AM  Result Value Ref Range Status   Specimen Description URINE, CLEAN CATCH  Final   Special Requests Normal  Final   Culture MULTIPLE SPECIES PRESENT, SUGGEST RECOLLECTION (A)  Final   Report Status 09/21/2017 FINAL  Final  Culture, blood (routine x 2)     Status: Abnormal   Collection Time: 09/20/17 12:28 AM  Result Value Ref Range Status   Specimen Description BLOOD LEFT ARM  Final   Special Requests   Final  BOTTLES DRAWN AEROBIC AND ANAEROBIC Blood Culture  adequate volume   Culture  Setup Time   Final    GRAM POSITIVE COCCI IN CHAINS AEROBIC BOTTLE ONLY Gram Stain Report Called to,Read Back By and Verified With: HAWKINS,M @ 1937 ON 11.21.18 BY BOWMAN,L CRITICAL RESULT CALLED TO, READ BACK BY AND VERIFIED WITH: L.Seay Pharm.D. 17:20 09/20/17 ([email or web address]) Performed at [facility], [place] [address], [place], [place] [address]    Culture GROUP B STREP(S.AGALACTIAE)ISOLATED (A)  Final   Report Status 09/22/2017 FINAL  Final   Organism ID, Bacteria GROUP B STREP(S.AGALACTIAE)ISOLATED  Final      Susceptibility   Group b strep(s.agalactiae)isolated - MIC*    CLINDAMYCIN <=0.25 SENSITIVE Sensitive     AMPICILLIN <=0.25 SENSITIVE Sensitive     ERYTHROMYCIN 2 RESISTANT Resistant     VANCOMYCIN 0.5 SENSITIVE Sensitive     CEFTRIAXONE <=0.12 SENSITIVE Sensitive     LEVOFLOXACIN 1 SENSITIVE Sensitive     * GROUP B STREP(S.AGALACTIAE)ISOLATED  Blood Culture ID Panel (Reflexed)     Status: Abnormal   Collection Time: 09/20/17 12:28 AM  Result Value Ref Range Status   Enterococcus species NOT DETECTED NOT DETECTED Final   Listeria monocytogenes NOT DETECTED NOT DETECTED Final   Staphylococcus species NOT DETECTED NOT DETECTED Final   Staphylococcus aureus NOT DETECTED NOT DETECTED Final   Streptococcus species DETECTED (A) NOT DETECTED Final    Comment: CRITICAL RESULT CALLED TO, READ BACK BY AND VERIFIED WITH: L. Seay Pharm.D. 17:20 09/20/17 ([email or web address])    Streptococcus agalactiae DETECTED (A) NOT DETECTED Final    Comment: CRITICAL RESULT CALLED TO, READ BACK BY AND VERIFIED WITH: L. Seay Pharm.D. 17:20 09/20/17 ([email or web address])    Streptococcus pneumoniae NOT DETECTED NOT DETECTED Final   Streptococcus pyogenes NOT DETECTED NOT DETECTED Final   Acinetobacter baumannii NOT DETECTED NOT DETECTED Final   Enterobacteriaceae species NOT DETECTED NOT DETECTED Final   Enterobacter cloacae complex NOT DETECTED NOT DETECTED Final   Escherichia coli NOT  DETECTED NOT DETECTED Final   Klebsiella oxytoca NOT DETECTED NOT DETECTED Final   Klebsiella pneumoniae NOT DETECTED NOT DETECTED Final   Proteus species NOT DETECTED NOT DETECTED Final   Serratia marcescens NOT DETECTED NOT DETECTED Final   Haemophilus influenzae NOT DETECTED NOT DETECTED Final   Neisseria meningitidis NOT DETECTED NOT DETECTED Final   Pseudomonas aeruginosa NOT DETECTED NOT DETECTED Final   Candida albicans NOT DETECTED NOT DETECTED Final   Candida glabrata NOT DETECTED NOT DETECTED Final   Candida krusei NOT DETECTED NOT DETECTED Final   Candida parapsilosis NOT DETECTED NOT DETECTED Final   Candida tropicalis NOT DETECTED NOT DETECTED Final    Comment: Performed at [facility], [place] [address] [place], [place] [address]  Culture, blood (routine x 2)     Status: None (Preliminary result)   Collection Time: 09/20/17 12:39 AM  Result Value Ref Range Status   Specimen Description BLOOD LEFT HAND  Final   Special Requests   Final    BOTTLES DRAWN AEROBIC AND ANAEROBIC Blood Culture adequate volume   Culture NO GROWTH 4 DAYS  Final   Report Status PENDING  Incomplete     Labs: Basic Metabolic Panel: Recent Labs  Lab 09/20/17 0005 09/20/17 0742 09/21/17 0458 09/22/17 0719 09/23/17 0630 09/24/17 0657  NA 137 134* 134* 132* 133* 136  K 3.3* 3.1* 3.5 3.9 3.4* 3.9  CL 104 104 107 103 102 105  CO2 25 21* 23 22 25  25  GLUCOSE 110* 115* 91 89 87 163*  BUN 5* <5* 7 5* 7 9  CREATININE 0.33* 0.30* <0.30* 0.30* 0.33* 0.32*  CALCIUM 8.1* 7.3* 7.6* 8.0* 8.2* 9.2  MG 1.5*  --   --  1.6* 1.6* 2.0   Liver Function Tests: Recent Labs  Lab 09/20/17 0005 09/20/17 0742 09/21/17 0458 09/22/17 0719  AST 120* 93* 67* 65*  ALT 29 22 20 21   ALKPHOS 103 92 86 93  BILITOT 5.6* 5.1* 3.6* 3.7*  PROT 7.7 6.5 6.3* 6.6  ALBUMIN 3.7 3.1* 3.0* 3.2*   Recent Labs  Lab 09/20/17 0005  LIPASE 28   Recent Labs  Lab 09/20/17 0005  AMMONIA 51*   CBC: Recent Labs    Lab 09/20/17 0005 09/20/17 0742 09/21/17 0458 09/22/17 0719 09/23/17 0630 09/24/17 0657  WBC 7.1 5.0 4.8 5.2 4.1 6.9  NEUTROABS 5.6  --   --   --   --   --   HGB 9.6* 8.1* 7.6* 8.2* 8.3* 10.0*  HCT 29.9* 25.2* 24.4* 26.2* 26.4* 31.1*  MCV 82.4 82.9 83.6 84.2 85.2 84.1  PLT 30* 16* 20* 33* 37* 50*   Cardiac Enzymes: Recent Labs  Lab 09/20/17 0936  CKTOTAL 157   BNP: Invalid input(s): POCBNP CBG: No results for input(s): GLUCAP in the last 168 hours.  Time coordinating discharge:  Greater than 30 minutes  Signed:  Ravynn Hogate, DO [facility] Pager: [phone] 09/24/2017, 5:14 PM

## 2017-09-24 NOTE — Progress Notes (Signed)
PROGRESS NOTE  Victoria Gordon STM:[id number] DOB: 27-Oct-1981 DOA: 09/19/2017 PCP: Patient, No Pcp Per  Brief History: 36 year old female with a history of alcoholic abuse with alcoholic liver injury, PTSD, depression presenting with 2-3-day history of right-sided abdominal pain and one day history of nausea and vomiting. The patient had previously drank on a daily basis for over 2 years, but reportedly stopped drinking on Mar 14, 2017. Since then, she has restarted it. She states that she only drinks 1 sixpack of beer per week. She could not tell me how long she has been drinking although it was previously documented from her previous records that she drank over 2 years. The patient had a mechanical fall on September 17, 2017 tripping over a speaker and falling onto her right side. She had another fall on September 18, 2017 falling in the bathtub. She denied any loss of consciousness. She had complained of subjective fevers and chills at home. She denied any headache, chest pain, shortness breath, cough, hemoptysis, hematemesis, hematochezia, melena.Alcohol level in the emergency department was 222. Since admission, the patient began having fevers up to 101.3 F.The patient was started on empiric Cubicin and Zosyn. Blood cultures grew group B streptococcus. Antibiotics were de-escalated to ceftriaxone.  Assessment/Plan: Sepsis -due to bacteremia -Lactic acid 2.8>>>0.8 -Initially startedZosyn -Initially startedCubicin as patient developed hives to vancomycin -Urinalysis negative for pyuria -Chest x-ray negative for infiltrates -Follow blood cultures-->group B streptococcus -Continue IV fluids-->saline lock with diet advancement -influenza pcr--neg  Group B Streptococcus Bacteremia -d/c zosyn and cubicin -continueceftriaxone--tentative stop date October 04, 2017 -Echo--EF 60-65%, no WMA, mild MR, PAS P 47, no vegetation  Hypersensitivity Reaction to IV  Vitamin K -11/24--hives and SOB with vitamin K infusion -improved with IV benadryl and solumedrol -D/C vitamin K  Coagulopathy/low grade DIC -Likely due to alcoholic liver diseaseand DIC -Hematologyconsult appreciated -INR 2.03, PTT72fibrinogen 183 -Low haptoglobin likely secondary to chronic liver disease;LDH normal -Monitor for signs of bleeding-->hematochezia? -mild bleeding at PICC line site-->pressure dressing  Hematochezia? -initially felt to be rectal blood, now pt feels this was vaginal from menses -GI consult appreciated -Hgb stable -baseline Hgb 8-9 -recent admit at OSH 05/11/17--hematochezia-->underwent EGD showing no varices but portal hypertensive gastropathy and flexible sigmoidoscopy showing external hemorrhoids. -[facility]felt tohavehemorrhoidal bleed status post sclerotherapy and suture fixation of hemorrhoids EGD -1 unit PRBC per GI--11/24  Intractable vomiting -Likely degree of alcoholic gastritis and possibly esophagitis -continuePPI-->change to po -improved -lipase 28 -Advance tosoft diet  Abdominal pain -September 20, 2017 CT abdomen pelvis--infiltration of subcutaneous fat on the lateral right pelvis consistent with soft tissue contusion -Likely secondary to mechanical fall -improved  Alcohol liver disease -Patient had liver biopsy June2,2018 -showed marked hepatocyte injury, mild steatohepatitis with extensive pericellular fibrosis stage 2 of 4 (no bridging fibrosis)  Alcohol abuse -Alcohol withdrawal protocol -no signs of withdrawal  Thrombocytopenia -Likely secondary to alcoholic liver disease with acute worsening due to infectious processand low grade DIC -improving with tx of infection -B12--924 -TSH/Free T4--suggestive of euthyroid sick syndrome  Hypokalemia/hypomagnesemia -Replete -give additional Kcl and Mag today  Hyperammonemia -No altered mental status -Likely secondary to chronic liver  disease     Disposition Plan: Home 11/26 if stable Family Communication:No family present-Total time spent 35 minutes.  Greater than 50% spent face to face counseling and coordinating care.   Consultants:hematology, GI  Code Status: FULL   DVT Prophylaxis: SCDs   Procedures: As Listed in Progress  Note Above  Antibiotics: Zosyn 11/20>>>11/21 cubicin 11/21x1 Ceftriaxone 11/21>>>>      Subjective: Patient denies fevers, chills, headache, chest pain, dyspnea, nausea, vomiting, diarrhea, abdominal pain, dysuria, hematuria, hematochezia, and melena. The patient has some bleeding from her left PICC line site  Objective: Vitals:   09/23/17 1425 09/23/17 2035 09/24/17 0526 09/24/17 1359  BP: 119/81 (!) 151/62 118/64 109/74  Pulse: (!) 121 78 78 97  Resp:  16 16 18   Temp: 98.9 F (37.2 C) 98.6 F (37 C) 99.1 F (37.3 C) 99.1 F (37.3 C)  TempSrc: Oral Oral Oral Oral  SpO2: 100% 100% 100% 100%  Weight:      Height:        Intake/Output Summary (Last 24 hours) at 09/24/2017 1541 Last data filed at 09/24/2017 0400 Gross per 24 hour  Intake 580 ml  Output 350 ml  Net 230 ml   Weight change:  Exam:   General:  Pt is alert, follows commands appropriately, not in acute distress  HEENT: No icterus, No thrush, No neck mass, [place]/AT  Cardiovascular: RRR, S1/S2, no rubs, no gallops  Respiratory: CTA bilaterally, no wheezing, no crackles, no rhonchi  Abdomen: Soft/+BS, non tender, non distended, no guarding  Extremities: No edema, No lymphangitis, No petechiae, No rashes, no synovitis   Data Reviewed: I have personally reviewed following labs and imaging studies Basic Metabolic Panel: Recent Labs  Lab 09/20/17 0005 09/20/17 0742 09/21/17 0458 09/22/17 0719 09/23/17 0630 09/24/17 0657  NA 137 134* 134* 132* 133* 136  K 3.3* 3.1* 3.5 3.9 3.4* 3.9  CL 104 104 107 103 102 105  CO2 25 21* 23 22 25 25   GLUCOSE 110* 115* 91 89 87 163*  BUN  5* <5* 7 5* 7 9  CREATININE 0.33* 0.30* <0.30* 0.30* 0.33* 0.32*  CALCIUM 8.1* 7.3* 7.6* 8.0* 8.2* 9.2  MG 1.5*  --   --  1.6* 1.6* 2.0   Liver Function Tests: Recent Labs  Lab 09/20/17 0005 09/20/17 0742 09/21/17 0458 09/22/17 0719  AST 120* 93* 67* 65*  ALT 29 22 20 21   ALKPHOS 103 92 86 93  BILITOT 5.6* 5.1* 3.6* 3.7*  PROT 7.7 6.5 6.3* 6.6  ALBUMIN 3.7 3.1* 3.0* 3.2*   Recent Labs  Lab 09/20/17 0005  LIPASE 28   Recent Labs  Lab 09/20/17 0005  AMMONIA 51*   Coagulation Profile: Recent Labs  Lab 09/20/17 0005 09/21/17 0458  INR 1.85 2.03   CBC: Recent Labs  Lab 09/20/17 0005 09/20/17 0742 09/21/17 0458 09/22/17 0719 09/23/17 0630 09/24/17 0657  WBC 7.1 5.0 4.8 5.2 4.1 6.9  NEUTROABS 5.6  --   --   --   --   --   HGB 9.6* 8.1* 7.6* 8.2* 8.3* 10.0*  HCT 29.9* 25.2* 24.4* 26.2* 26.4* 31.1*  MCV 82.4 82.9 83.6 84.2 85.2 84.1  PLT 30* 16* 20* 33* 37* 50*   Cardiac Enzymes: Recent Labs  Lab 09/20/17 0936  CKTOTAL 157   BNP: Invalid input(s): POCBNP CBG: No results for input(s): GLUCAP in the last 168 hours. HbA1C: No results for input(s): HGBA1C in the last 72 hours. Urine analysis:    Component Value Date/Time   COLORURINE AMBER (A) 09/20/2017 0001   APPEARANCEUR HAZY (A) 09/20/2017 0001   LABSPEC 1.017 09/20/2017 0001   PHURINE 6.0 09/20/2017 0001   GLUCOSEU NEGATIVE 09/20/2017 0001   HGBUR SMALL (A) 09/20/2017 0001   BILIRUBINUR NEGATIVE 09/20/2017 0001   KETONESUR NEGATIVE 09/20/2017 0001  PROTEINUR 30 (A) 09/20/2017 0001   UROBILINOGEN 0.2 02/17/2014 1509   NITRITE NEGATIVE 09/20/2017 0001   LEUKOCYTESUR NEGATIVE 09/20/2017 0001   Sepsis Labs: @LABRCNTIP (procalcitonin:4,lacticidven:4) ) Recent Results (from the past 240 hour(s))  Urine culture     Status: Abnormal   Collection Time: 09/20/17 12:01 AM  Result Value Ref Range Status   Specimen Description URINE, CLEAN CATCH  Final   Special Requests Normal  Final   Culture  MULTIPLE SPECIES PRESENT, SUGGEST RECOLLECTION (A)  Final   Report Status 09/21/2017 FINAL  Final  Culture, blood (routine x 2)     Status: Abnormal   Collection Time: 09/20/17 12:28 AM  Result Value Ref Range Status   Specimen Description BLOOD LEFT ARM  Final   Special Requests   Final    BOTTLES DRAWN AEROBIC AND ANAEROBIC Blood Culture adequate volume   Culture  Setup Time   Final    GRAM POSITIVE COCCI IN CHAINS AEROBIC BOTTLE ONLY Gram Stain Report Called to,Read Back By and Verified With: HAWKINS,M @ 0626 ON 11.21.18 BY BOWMAN,L CRITICAL RESULT CALLED TO, READ BACK BY AND VERIFIED WITH: L.Seay Pharm.D. 17:20 09/20/17 ([email or web address]) Performed at [facility], [place] [address] [place], [place] [address]    Culture GROUP B STREP(S.AGALACTIAE)ISOLATED (A)  Final   Report Status 09/22/2017 FINAL  Final   Organism ID, Bacteria GROUP B STREP(S.AGALACTIAE)ISOLATED  Final      Susceptibility   Group b strep(s.agalactiae)isolated - MIC*    CLINDAMYCIN <=0.25 SENSITIVE Sensitive     AMPICILLIN <=0.25 SENSITIVE Sensitive     ERYTHROMYCIN 2 RESISTANT Resistant     VANCOMYCIN 0.5 SENSITIVE Sensitive     CEFTRIAXONE <=0.12 SENSITIVE Sensitive     LEVOFLOXACIN 1 SENSITIVE Sensitive     * GROUP B STREP(S.AGALACTIAE)ISOLATED  Blood Culture ID Panel (Reflexed)     Status: Abnormal   Collection Time: 09/20/17 12:28 AM  Result Value Ref Range Status   Enterococcus species NOT DETECTED NOT DETECTED Final   Listeria monocytogenes NOT DETECTED NOT DETECTED Final   Staphylococcus species NOT DETECTED NOT DETECTED Final   Staphylococcus aureus NOT DETECTED NOT DETECTED Final   Streptococcus species DETECTED (A) NOT DETECTED Final    Comment: CRITICAL RESULT CALLED TO, READ BACK BY AND VERIFIED WITH: L. Seay Pharm.D. 17:20 09/20/17 ([email or web address])    Streptococcus agalactiae DETECTED (A) NOT DETECTED Final    Comment: CRITICAL RESULT CALLED TO, READ BACK BY AND VERIFIED WITH: L. Seay Pharm.D. 17:20  09/20/17 ([email or web address])    Streptococcus pneumoniae NOT DETECTED NOT DETECTED Final   Streptococcus pyogenes NOT DETECTED NOT DETECTED Final   Acinetobacter baumannii NOT DETECTED NOT DETECTED Final   Enterobacteriaceae species NOT DETECTED NOT DETECTED Final   Enterobacter cloacae complex NOT DETECTED NOT DETECTED Final   Escherichia coli NOT DETECTED NOT DETECTED Final   Klebsiella oxytoca NOT DETECTED NOT DETECTED Final   Klebsiella pneumoniae NOT DETECTED NOT DETECTED Final   Proteus species NOT DETECTED NOT DETECTED Final   Serratia marcescens NOT DETECTED NOT DETECTED Final   Haemophilus influenzae NOT DETECTED NOT DETECTED Final   Neisseria meningitidis NOT DETECTED NOT DETECTED Final   Pseudomonas aeruginosa NOT DETECTED NOT DETECTED Final   Candida albicans NOT DETECTED NOT DETECTED Final   Candida glabrata NOT DETECTED NOT DETECTED Final   Candida krusei NOT DETECTED NOT DETECTED Final   Candida parapsilosis NOT DETECTED NOT DETECTED Final   Candida tropicalis NOT DETECTED NOT DETECTED Final    Comment: Performed at [facility]  [facility], [place] [address] [place], [place] [address]  Culture, blood (routine x 2)     Status: None (Preliminary result)   Collection Time: 09/20/17 12:39 AM  Result Value Ref Range Status   Specimen Description BLOOD LEFT HAND  Final   Special Requests   Final    BOTTLES DRAWN AEROBIC AND ANAEROBIC Blood Culture adequate volume   Culture NO GROWTH 3 DAYS  Final   Report Status PENDING  Incomplete     Scheduled Meds: . feeding supplement (ENSURE ENLIVE)  237 mL Oral BID BM  . folic acid  1 mg Oral Daily  . hydrocortisone   Rectal BID  . multivitamin with minerals  1 tablet Oral Daily  . pantoprazole  40 mg Oral BID AC  . sertraline  100 mg Oral Daily  . thiamine  100 mg Oral Daily   Or  . thiamine  100 mg Intravenous Daily   Continuous Infusions: . cefTRIAXone (ROCEPHIN)  IV Stopped (09/23/17 2013)    Procedures/Studies: Dg Chest 2  View  Result Date: 09/20/2017 CLINICAL DATA:  Shortness of breath and cough since yesterday. EXAM: CHEST  2 VIEW COMPARISON:  06/09/2004 FINDINGS: The heart size and mediastinal contours are within normal limits. Both lungs are clear. The visualized skeletal structures are unremarkable. Surgical clips in the right upper quadrant. IMPRESSION: No active cardiopulmonary disease. Electronically Signed   By: Lucienne Capers M.D.   On: 09/20/2017 02:39   Ct Head Wo Contrast  Result Date: 09/20/2017 CLINICAL DATA:  36 year old female with head trauma. EXAM: CT HEAD WITHOUT CONTRAST CT CERVICAL SPINE WITHOUT CONTRAST TECHNIQUE: Multidetector CT imaging of the head and cervical spine was performed following the standard protocol without intravenous contrast. Multiplanar CT image reconstructions of the cervical spine were also generated. COMPARISON:  Head CT dated 02/07/2016 FINDINGS: CT HEAD FINDINGS Brain: No evidence of acute infarction, hemorrhage, hydrocephalus, extra-axial collection or mass lesion/mass effect. Vascular: No hyperdense vessel or unexpected calcification. Skull: Normal. Negative for fracture or focal lesion. Sinuses/Orbits: Mild mucoperiosteal thickening of paranasal sinuses. No air-fluid level. The mastoid air cells are clear. Other: None CT CERVICAL SPINE FINDINGS Alignment: No acute subluxation. Mild reversal of normal cervical lordosis which may be positional or due to muscle spasm. Skull base and vertebrae: No acute fracture. No primary bone lesion or focal pathologic process. Soft tissues and spinal canal: No prevertebral fluid or swelling. No visible canal hematoma. Disc levels:  Mild degenerative changes and disc bulge at C5-C6. Upper chest: Negative. Other: None IMPRESSION: 1. Unremarkable noncontrast CT of the brain. 2. No acute/traumatic cervical spine pathology. Electronically Signed   By: Anner Crete M.D.   On: 09/20/2017 01:23   Ct Cervical Spine Wo Contrast  Result Date:  09/20/2017 CLINICAL DATA:  36 year old female with head trauma. EXAM: CT HEAD WITHOUT CONTRAST CT CERVICAL SPINE WITHOUT CONTRAST TECHNIQUE: Multidetector CT imaging of the head and cervical spine was performed following the standard protocol without intravenous contrast. Multiplanar CT image reconstructions of the cervical spine were also generated. COMPARISON:  Head CT dated 02/07/2016 FINDINGS: CT HEAD FINDINGS Brain: No evidence of acute infarction, hemorrhage, hydrocephalus, extra-axial collection or mass lesion/mass effect. Vascular: No hyperdense vessel or unexpected calcification. Skull: Normal. Negative for fracture or focal lesion. Sinuses/Orbits: Mild mucoperiosteal thickening of paranasal sinuses. No air-fluid level. The mastoid air cells are clear. Other: None CT CERVICAL SPINE FINDINGS Alignment: No acute subluxation. Mild reversal of normal cervical lordosis which may be positional or due to muscle  spasm. Skull base and vertebrae: No acute fracture. No primary bone lesion or focal pathologic process. Soft tissues and spinal canal: No prevertebral fluid or swelling. No visible canal hematoma. Disc levels:  Mild degenerative changes and disc bulge at C5-C6. Upper chest: Negative. Other: None IMPRESSION: 1. Unremarkable noncontrast CT of the brain. 2. No acute/traumatic cervical spine pathology. Electronically Signed   By: Anner Crete M.D.   On: 09/20/2017 01:23   US Abdomen Complete  Result Date: 09/20/2017 CLINICAL DATA:  Thrombocytopenia EXAM: ABDOMEN ULTRASOUND COMPLETE COMPARISON:  CT 09/20/2017 FINDINGS: Gallbladder: Prior cholecystectomy Common bile duct: Diameter: Dilated, 11 mm, stable dating back to 2017 compatible with post cholecystectomy state. Liver: No focal lesion identified. Within normal limits in parenchymal echogenicity. Portal vein is patent on color Doppler imaging with normal direction of blood flow towards the liver. IVC: No abnormality visualized. Pancreas: Visualized  portion unremarkable. Spleen: Enlarged with a craniocaudal length of 14 cm and a splenic volume of 743 mL. Right Kidney: Length: 13.8 cm. Echogenicity within normal limits. No mass or hydronephrosis visualized. Left Kidney: Length: 15.6 cm. Echogenicity within normal limits. No mass or hydronephrosis visualized. Abdominal aorta: No aneurysm visualized. Other findings: None. IMPRESSION: Splenomegaly as above. Dilated common bile duct is stable since prior CTs dating back to 2017 compatible with post cholecystectomy state. Electronically Signed   By: Rolm Baptise M.D.   On: 09/20/2017 11:47   Ct Abdomen Pelvis W Contrast  Result Date: 09/20/2017 CLINICAL DATA:  Nausea and vomiting starting today. Abdominal pain. Slip and fall injury striking the lower back. EXAM: CT ABDOMEN AND PELVIS WITH CONTRAST TECHNIQUE: Multidetector CT imaging of the abdomen and pelvis was performed using the standard protocol following bolus administration of intravenous contrast. CONTRAST:  160mL ISOVUE-300 IOPAMIDOL (ISOVUE-300) INJECTION 61% COMPARISON:  11/22/2016 FINDINGS: Lower chest: Lung bases are clear. Hepatobiliary: No focal liver abnormality is seen. Status post cholecystectomy. No biliary dilatation. Pancreas: Unremarkable. No pancreatic ductal dilatation or surrounding inflammatory changes. Spleen: Normal in size without focal abnormality. Adrenals/Urinary Tract: Adrenal glands are unremarkable. Kidneys are normal, without renal calculi, focal lesion, or hydronephrosis. Bladder is unremarkable. Stomach/Bowel: Stomach is within normal limits. Appendix appears normal. No evidence of bowel wall thickening, distention, or inflammatory changes. Vascular/Lymphatic: No significant vascular findings are present. No enlarged abdominal or pelvic lymph nodes. Reproductive: Uterus is anteverted and is not enlarged. Simple appearing cyst on the right ovary measuring 3.3 cm diameter. Decreasing in size since previous study. Dermoid cyst  in the left ovary measuring 3.4 cm diameter without change. Other: No free air or free fluid in the abdomen. Musculoskeletal: Infiltration in the subcutaneous fat lateral to the right pelvis consistent with soft tissue contusion. No loculated collections. No acute fractures identified. IMPRESSION: 1. Soft tissue infiltration consistent with contusion lateral to the right pelvis. 2. Simple appearing right ovarian cyst without change since previous study. Dermoid cyst in the left ovary without change. 3. No evidence of bowel obstruction or inflammation. Electronically Signed   By: Lucienne Capers M.D.   On: 09/20/2017 01:20    Nishat Livingston, DO  [facility] Pager [phone]  If 7PM-7AM, please contact night-coverage [email or web address] Password [email or web address] 09/24/2017, 3:41 PM   LOS: 4 days

## 2017-09-25 LAB — CBC
HEMATOCRIT: 27.5 % — AB (ref 36.0–46.0)
Hemoglobin: 8.7 g/dL — ABNORMAL LOW (ref 12.0–15.0)
MCH: 27 pg (ref 26.0–34.0)
MCHC: 31.6 g/dL (ref 30.0–36.0)
MCV: 85.4 fL (ref 78.0–100.0)
PLATELETS: 51 10*3/uL — AB (ref 150–400)
RBC: 3.22 MIL/uL — ABNORMAL LOW (ref 3.87–5.11)
RDW: 22.2 % — AB (ref 11.5–15.5)
WBC: 5.3 10*3/uL (ref 4.0–10.5)

## 2017-09-25 LAB — CULTURE, BLOOD (ROUTINE X 2)
CULTURE: NO GROWTH
Special Requests: ADEQUATE

## 2017-09-25 LAB — HEPATIC FUNCTION PANEL
ALT: 20 U/L (ref 14–54)
AST: 47 U/L — ABNORMAL HIGH (ref 15–41)
Albumin: 3 g/dL — ABNORMAL LOW (ref 3.5–5.0)
Alkaline Phosphatase: 141 U/L — ABNORMAL HIGH (ref 38–126)
BILIRUBIN DIRECT: 0.9 mg/dL — AB (ref 0.1–0.5)
BILIRUBIN INDIRECT: 2 mg/dL — AB (ref 0.3–0.9)
TOTAL PROTEIN: 6.2 g/dL — AB (ref 6.5–8.1)
Total Bilirubin: 2.9 mg/dL — ABNORMAL HIGH (ref 0.3–1.2)

## 2017-09-25 LAB — PROTIME-INR
INR: 1.75
PROTHROMBIN TIME: 20.3 s — AB (ref 11.4–15.2)

## 2017-09-25 MED ORDER — DEXTROSE 5 % IV SOLN
2.0000 g | INTRAVENOUS | Status: DC
  Administered 2017-09-25: 2 g via INTRAVENOUS
  Filled 2017-09-25 (×2): qty 2

## 2017-09-25 MED ORDER — DEXTROSE 5 % IV SOLN: 2.0000 g | INTRAVENOUS | 0 refills | Status: DC

## 2017-09-25 NOTE — Discharge Summary (Addendum)
Physician Discharge Summary  Victoria Gordon KGM:[id number] DOB: [birth year] DOA: 09/19/2017  PCP: Patient, No Pcp Per  Admit date: 09/19/2017 Discharge date: 09/25/2017  ADDENDUM--discharge cancelled today due to bleeding from PICC line Date of Service 09/25/17  Admitted From: Home Disposition:  Home  Recommendations for Outpatient Follow-up:  1. Follow up with PCP in 1-2 weeks 2. Please obtain BMP/CBC in one week   Home Health: YES Equipment/Devices: IV ceftriaxone through 10/03/17  Discharge Condition: Stable CODE STATUS: FULL Diet recommendation: Heart Healthy /   Brief/Interim Summary: 36 year old female with a history of alcoholic abuse with alcoholic liver injury, PTSD, depression presenting with 2-3-day history of right-sided abdominal pain and one day history of nausea and vomiting. The patient had previously drank on a daily basis for over 2 years, but reportedly stopped drinking on Mar 14, 2017. Since then, she has restarted it. She states that she only drinks 1 sixpack of beer per week. She could not tell me how long she has been drinking although it was previously documented from her previous records that she drank over 2 years. The patient had a mechanical fall on September 17, 2017 tripping over a speaker and falling onto her right side. She had another fall on September 18, 2017 falling in the bathtub. She denied any loss of consciousness. She had complained of subjective fevers and chills at home. She denied any headache, chest pain, shortness breath, cough, hemoptysis, hematemesis, hematochezia, melena.Alcohol level in the emergency department was 222. Since admission, the patient began having fevers up to 101.3 F.The patient was started on empiric Cubicin and Zosyn. Blood cultures grew group B streptococcus. Antibiotics were de-escalated to ceftriaxone.  She subsequently developed what she initially thought to be hematochezia.  She subsequently  realized that she was starting her menses.  Nevertheless, GI was consulted.  They did not feel any further invasive intervention was warranted.  Her Hgb remained stable.  GI recommended transfusing one unit PRBCs.  A PICC line was placed for home IV abx.    Discharge Diagnoses:  Sepsis -due to bacteremia -Lactic acid 2.8>>>0.8 -Initially startedZosyn -Initially startedCubicin as patient developed hives to vancomycin -Urinalysis negative for pyuria -Chest x-ray negative for infiltrates -Follow blood cultures-->group B streptococcus -Continue IV fluids-->saline lock with diet advancement -influenza pcr--neg  Group B Streptococcus Bacteremia -d/c zosyn and cubicin -continueceftriaxone--tentative stop date October 04, 2017 (14 days tx) -Echo--EF 60-65%, no WMA, mild MR, PAS P 47, no vegetation  Hypersensitivity Reaction to IV Vitamin K -11/24--hives and SOB with vitamin K infusion -improved with IV benadryl and solumedrol -D/C vitamin K  Coagulopathy/low grade DIC -Likely due to alcoholic liver diseaseand DIC -Hematologyconsult appreciated -INR 2.03, PTT38fibrinogen 183 -Low haptoglobin likely secondary to chronic liver disease;LDH normal -Monitor for signs of bleeding-->hematochezia? -mild bleeding at PICC line site-->pressure dressing-->improved  Hematochezia? -initially felt to be rectal blood, now pt feels this was vaginal from menses -GI consultappreciated -Hgb stable -baseline Hgb 8-9 -recent admit at OSH 05/11/17--hematochezia-->underwent EGD showing no varices but portal hypertensive gastropathy and flexible sigmoidoscopy showing external hemorrhoids. -[facility] --felt tohavehemorrhoidal bleed status post sclerotherapy and suture fixation of hemorrhoids EGD -1 unit PRBC per GI--11/24 -Hgb 8.7 on day of discharge  Intractable vomiting -Likely degree of alcoholic gastritis and possibly esophagitis -continuePPI-->change to  po -improved -lipase 28 -Advanced tosoft diet which the patient tolerated  Abdominal pain -September 20, 2017 CT abdomen pelvis--infiltration of subcutaneous fat on the lateral right pelvis consistent with soft tissue contusion -Likely secondary to  mechanical fall -improved  Alcohol liver disease -Patient had liver biopsy June2,2018 -showed marked hepatocyte injury, mild steatohepatitis with extensive pericellular fibrosis stage 2 of 4 (no bridging fibrosis)  Alcohol abuse -Alcohol withdrawal protocol -no signs of withdrawal  Thrombocytopenia -Likely secondary to alcoholic liver disease with acute worsening due to infectious processand low grade DIC -improving with tx of infection--platelets 51K on day of d/c -B12--924 -TSH/Free T4--suggestive of euthyroid sick syndrome  Hypokalemia/hypomagnesemia -Repleted  Hyperammonemia -No altered mental status -Likely secondary to chronic liver disease     Discharge Instructions  Discharge Instructions    Diet - low sodium heart healthy   Complete by:  As directed    Home infusion instructions [facility] May follow [place] Dosing Protocol; May administer Cathflo as needed to maintain patency of vascular access device.; [place]ushing of vascular access device: per [facility] Protocol: 0.9% NaCl pre/post medica...   Complete by:  As directed    Instructions:  May follow [place] Dosing Protocol   Instructions:  May administer Cathflo as needed to maintain patency of vascular access device.   Instructions:  Flushing of vascular access device: per [facility] Protocol: 0.9% NaCl pre/post medication administration and prn patency; Heparin 100 u/ml, 71ml for implanted ports and Heparin 10u/ml, 48ml for all other central venous catheters.   Instructions:  May follow [facility] Anaphylaxis Protocol for First Dose Administration in the home: 0.9% NaCl at 25-50 ml/hr to maintain IV access for protocol meds. Epinephrine 0.3 ml IV/IM PRN and  Benadryl 25-50 IV/IM PRN s/s of anaphylaxis.   Instructions:  [place] Infusion Coordinator (RN) to assist per patient IV care needs in the home PRN.   Increase activity slowly   Complete by:  As directed      Allergies as of 09/25/2017      Reactions   Vitamin K And Related Hives, Shortness Of Breath, Itching, Rash   Vancomycin Hives, Itching      Medication List    STOP taking these medications   traZODone 150 MG tablet Commonly known as:  DESYREL     TAKE these medications   cefTRIAXone 2 g in dextrose 5 % 50 mL Inject 2 g into the vein daily. Start 09/26/17.  Last dose to be given on 10/03/2017. Start taking on:  09/26/2017   feeding supplement (ENSURE ENLIVE) Liqd Take 237 mLs by mouth 2 (two) times daily between meals.   furosemide 20 MG tablet Commonly known as:  LASIX Take 2 tablets (40 mg total) by mouth daily.   hydrocortisone 2.5 % rectal cream Commonly known as:  ANUSOL-HC Place rectally 2 (two) times daily at 10 AM and 5 PM.   pantoprazole 40 MG tablet Commonly known as:  PROTONIX Take 1 tablet (40 mg total) by mouth 2 (two) times daily before a meal.   potassium chloride 10 MEQ CR capsule Commonly known as:  MICRO-K Take 2 capsules by mouth daily.   propranolol 20 MG tablet Commonly known as:  INDERAL Take 20 mg by mouth 2 (two) times daily.   sertraline 100 MG tablet Commonly known as:  ZOLOFT Take 1 tablet (100 mg total) by mouth daily. For depression   spironolactone 100 MG tablet Commonly known as:  ALDACTONE Take 1 tablet (100 mg total) by mouth daily.   thiamine 100 MG tablet Take 100 mg by mouth daily.            Home Infusion Instuctions  (From admission, onward)  Start     Ordered   09/22/17 0000  Home infusion instructions [facility] May follow [place] Dosing Protocol; May administer Cathflo as needed to maintain patency of vascular access device.; Flushing of vascular access device: per [facility]  Protocol: 0.9% NaCl pre/post medica...    Question Answer Comment  Instructions May follow [place] Dosing Protocol   Instructions May administer Cathflo as needed to maintain patency of vascular access device.   Instructions Flushing of vascular access device: per [facility] Protocol: 0.9% NaCl pre/post medication administration and prn patency; Heparin 100 u/ml, 39ml for implanted ports and Heparin 10u/ml, 36ml for all other central venous catheters.   Instructions May follow [facility] Anaphylaxis Protocol for First Dose Administration in the home: 0.9% NaCl at 25-50 ml/hr to maintain IV access for protocol meds. Epinephrine 0.3 ml IV/IM PRN and Benadryl 25-50 IV/IM PRN s/s of anaphylaxis.   Instructions [facility] Infusion Coordinator (RN) to assist per patient IV care needs in the home PRN.      09/22/17 1544     Follow-up Information    [facility], [facility]-Home Follow up.   Specialty:  Home Health Services Contact information: [place] [id number] [phone]          Allergies  Allergen Reactions  . Vitamin K And Related Hives, Shortness Of Breath, Itching and Rash  . Vancomycin Hives and Itching    Consultations:  GI--Fields   Procedures/Studies: Dg Chest 2 View  Result Date: 09/20/2017 CLINICAL DATA:  Shortness of breath and cough since yesterday. EXAM: CHEST  2 VIEW COMPARISON:  06/09/2004 FINDINGS: The heart size and mediastinal contours are within normal limits. Both lungs are clear. The visualized skeletal structures are unremarkable. Surgical clips in the right upper quadrant. IMPRESSION: No active cardiopulmonary disease. Electronically Signed   By: Lucienne Capers M.D.   On: 09/20/2017 02:39   Ct Head Wo Contrast  Result Date: 09/20/2017 CLINICAL DATA:  36 year old female with head trauma. EXAM: CT HEAD WITHOUT CONTRAST CT CERVICAL SPINE WITHOUT CONTRAST TECHNIQUE: Multidetector CT imaging of the head and cervical spine was  performed following the standard protocol without intravenous contrast. Multiplanar CT image reconstructions of the cervical spine were also generated. COMPARISON:  Head CT dated 02/07/2016 FINDINGS: CT HEAD FINDINGS Brain: No evidence of acute infarction, hemorrhage, hydrocephalus, extra-axial collection or mass lesion/mass effect. Vascular: No hyperdense vessel or unexpected calcification. Skull: Normal. Negative for fracture or focal lesion. Sinuses/Orbits: Mild mucoperiosteal thickening of paranasal sinuses. No air-fluid level. The mastoid air cells are clear. Other: None CT CERVICAL SPINE FINDINGS Alignment: No acute subluxation. Mild reversal of normal cervical lordosis which may be positional or due to muscle spasm. Skull base and vertebrae: No acute fracture. No primary bone lesion or focal pathologic process. Soft tissues and spinal canal: No prevertebral fluid or swelling. No visible canal hematoma. Disc levels:  Mild degenerative changes and disc bulge at C5-C6. Upper chest: Negative. Other: None IMPRESSION: 1. Unremarkable noncontrast CT of the brain. 2. No acute/traumatic cervical spine pathology. Electronically Signed   By: Anner Crete M.D.   On: 09/20/2017 01:23   Ct Cervical Spine Wo Contrast  Result Date: 09/20/2017 CLINICAL DATA:  36 year old female with head trauma. EXAM: CT HEAD WITHOUT CONTRAST CT CERVICAL SPINE WITHOUT CONTRAST TECHNIQUE: Multidetector CT imaging of the head and cervical spine was performed following the standard protocol without intravenous contrast. Multiplanar CT image reconstructions of the cervical spine were also generated. COMPARISON:  Head  CT dated 02/07/2016 FINDINGS: CT HEAD FINDINGS Brain: No evidence of acute infarction, hemorrhage, hydrocephalus, extra-axial collection or mass lesion/mass effect. Vascular: No hyperdense vessel or unexpected calcification. Skull: Normal. Negative for fracture or focal lesion. Sinuses/Orbits: Mild mucoperiosteal thickening  of paranasal sinuses. No air-fluid level. The mastoid air cells are clear. Other: None CT CERVICAL SPINE FINDINGS Alignment: No acute subluxation. Mild reversal of normal cervical lordosis which may be positional or due to muscle spasm. Skull base and vertebrae: No acute fracture. No primary bone lesion or focal pathologic process. Soft tissues and spinal canal: No prevertebral fluid or swelling. No visible canal hematoma. Disc levels:  Mild degenerative changes and disc bulge at C5-C6. Upper chest: Negative. Other: None IMPRESSION: 1. Unremarkable noncontrast CT of the brain. 2. No acute/traumatic cervical spine pathology. Electronically Signed   By: Anner Crete M.D.   On: 09/20/2017 01:23   US Abdomen Complete  Result Date: 09/20/2017 CLINICAL DATA:  Thrombocytopenia EXAM: ABDOMEN ULTRASOUND COMPLETE COMPARISON:  CT 09/20/2017 FINDINGS: Gallbladder: Prior cholecystectomy Common bile duct: Diameter: Dilated, 11 mm, stable dating back to 2017 compatible with post cholecystectomy state. Liver: No focal lesion identified. Within normal limits in parenchymal echogenicity. Portal vein is patent on color Doppler imaging with normal direction of blood flow towards the liver. IVC: No abnormality visualized. Pancreas: Visualized portion unremarkable. Spleen: Enlarged with a craniocaudal length of 14 cm and a splenic volume of 743 mL. Right Kidney: Length: 13.8 cm. Echogenicity within normal limits. No mass or hydronephrosis visualized. Left Kidney: Length: 15.6 cm. Echogenicity within normal limits. No mass or hydronephrosis visualized. Abdominal aorta: No aneurysm visualized. Other findings: None. IMPRESSION: Splenomegaly as above. Dilated common bile duct is stable since prior CTs dating back to 2017 compatible with post cholecystectomy state. Electronically Signed   By: Rolm Baptise M.D.   On: 09/20/2017 11:47   Ct Abdomen Pelvis W Contrast  Result Date: 09/20/2017 CLINICAL DATA:  Nausea and vomiting  starting today. Abdominal pain. Slip and fall injury striking the lower back. EXAM: CT ABDOMEN AND PELVIS WITH CONTRAST TECHNIQUE: Multidetector CT imaging of the abdomen and pelvis was performed using the standard protocol following bolus administration of intravenous contrast. CONTRAST:  153mL ISOVUE-300 IOPAMIDOL (ISOVUE-300) INJECTION 61% COMPARISON:  11/22/2016 FINDINGS: Lower chest: Lung bases are clear. Hepatobiliary: No focal liver abnormality is seen. Status post cholecystectomy. No biliary dilatation. Pancreas: Unremarkable. No pancreatic ductal dilatation or surrounding inflammatory changes. Spleen: Normal in size without focal abnormality. Adrenals/Urinary Tract: Adrenal glands are unremarkable. Kidneys are normal, without renal calculi, focal lesion, or hydronephrosis. Bladder is unremarkable. Stomach/Bowel: Stomach is within normal limits. Appendix appears normal. No evidence of bowel wall thickening, distention, or inflammatory changes. Vascular/Lymphatic: No significant vascular findings are present. No enlarged abdominal or pelvic lymph nodes. Reproductive: Uterus is anteverted and is not enlarged. Simple appearing cyst on the right ovary measuring 3.3 cm diameter. Decreasing in size since previous study. Dermoid cyst in the left ovary measuring 3.4 cm diameter without change. Other: No free air or free fluid in the abdomen. Musculoskeletal: Infiltration in the subcutaneous fat lateral to the right pelvis consistent with soft tissue contusion. No loculated collections. No acute fractures identified. IMPRESSION: 1. Soft tissue infiltration consistent with contusion lateral to the right pelvis. 2. Simple appearing right ovarian cyst without change since previous study. Dermoid cyst in the left ovary without change. 3. No evidence of bowel obstruction or inflammation. Electronically Signed   By: Lucienne Capers M.D.   On: 09/20/2017 01:20  Discharge Exam: Vitals:   09/25/17 1200  09/25/17 1749  BP: 123/75 118/70  Pulse: 99 85  Resp:    Temp:    SpO2:     Vitals:   09/24/17 1919 09/25/17 0544 09/25/17 1200 09/25/17 1749  BP: 108/71 120/75 123/75 118/70  Pulse: 93 91 99 85  Resp: 18 20    Temp: 98.7 F (37.1 C) 98.6 F (37 C)    TempSrc: Oral Oral    SpO2: 100% 100%    Weight:      Height:        General: Pt is alert, awake, not in acute distress Cardiovascular: RRR, S1/S2 +, no rubs, no gallops Respiratory: CTA bilaterally, no wheezing, no rhonchi Abdominal: Soft, NT, ND, bowel sounds + Extremities: no edema, no cyanosis   The results of significant diagnostics from this hospitalization (including imaging, microbiology, ancillary and laboratory) are listed below for reference.    Significant Diagnostic Studies: Dg Chest 2 View  Result Date: 09/20/2017 CLINICAL DATA:  Shortness of breath and cough since yesterday. EXAM: CHEST  2 VIEW COMPARISON:  06/09/2004 FINDINGS: The heart size and mediastinal contours are within normal limits. Both lungs are clear. The visualized skeletal structures are unremarkable. Surgical clips in the right upper quadrant. IMPRESSION: No active cardiopulmonary disease. Electronically Signed   By: Lucienne Capers M.D.   On: 09/20/2017 02:39   Ct Head Wo Contrast  Result Date: 09/20/2017 CLINICAL DATA:  36 year old female with head trauma. EXAM: CT HEAD WITHOUT CONTRAST CT CERVICAL SPINE WITHOUT CONTRAST TECHNIQUE: Multidetector CT imaging of the head and cervical spine was performed following the standard protocol without intravenous contrast. Multiplanar CT image reconstructions of the cervical spine were also generated. COMPARISON:  Head CT dated 02/07/2016 FINDINGS: CT HEAD FINDINGS Brain: No evidence of acute infarction, hemorrhage, hydrocephalus, extra-axial collection or mass lesion/mass effect. Vascular: No hyperdense vessel or unexpected calcification. Skull: Normal. Negative for fracture or focal lesion. Sinuses/Orbits:  Mild mucoperiosteal thickening of paranasal sinuses. No air-fluid level. The mastoid air cells are clear. Other: None CT CERVICAL SPINE FINDINGS Alignment: No acute subluxation. Mild reversal of normal cervical lordosis which may be positional or due to muscle spasm. Skull base and vertebrae: No acute fracture. No primary bone lesion or focal pathologic process. Soft tissues and spinal canal: No prevertebral fluid or swelling. No visible canal hematoma. Disc levels:  Mild degenerative changes and disc bulge at C5-C6. Upper chest: Negative. Other: None IMPRESSION: 1. Unremarkable noncontrast CT of the brain. 2. No acute/traumatic cervical spine pathology. Electronically Signed   By: Anner Crete M.D.   On: 09/20/2017 01:23   Ct Cervical Spine Wo Contrast  Result Date: 09/20/2017 CLINICAL DATA:  36 year old female with head trauma. EXAM: CT HEAD WITHOUT CONTRAST CT CERVICAL SPINE WITHOUT CONTRAST TECHNIQUE: Multidetector CT imaging of the head and cervical spine was performed following the standard protocol without intravenous contrast. Multiplanar CT image reconstructions of the cervical spine were also generated. COMPARISON:  Head CT dated 02/07/2016 FINDINGS: CT HEAD FINDINGS Brain: No evidence of acute infarction, hemorrhage, hydrocephalus, extra-axial collection or mass lesion/mass effect. Vascular: No hyperdense vessel or unexpected calcification. Skull: Normal. Negative for fracture or focal lesion. Sinuses/Orbits: Mild mucoperiosteal thickening of paranasal sinuses. No air-fluid level. The mastoid air cells are clear. Other: None CT CERVICAL SPINE FINDINGS Alignment: No acute subluxation. Mild reversal of normal cervical lordosis which may be positional or due to muscle spasm. Skull base and vertebrae: No acute fracture. No primary bone lesion or focal  pathologic process. Soft tissues and spinal canal: No prevertebral fluid or swelling. No visible canal hematoma. Disc levels:  Mild degenerative  changes and disc bulge at C5-C6. Upper chest: Negative. Other: None IMPRESSION: 1. Unremarkable noncontrast CT of the brain. 2. No acute/traumatic cervical spine pathology. Electronically Signed   By: Anner Crete M.D.   On: 09/20/2017 01:23   US Abdomen Complete  Result Date: 09/20/2017 CLINICAL DATA:  Thrombocytopenia EXAM: ABDOMEN ULTRASOUND COMPLETE COMPARISON:  CT 09/20/2017 FINDINGS: Gallbladder: Prior cholecystectomy Common bile duct: Diameter: Dilated, 11 mm, stable dating back to 2017 compatible with post cholecystectomy state. Liver: No focal lesion identified. Within normal limits in parenchymal echogenicity. Portal vein is patent on color Doppler imaging with normal direction of blood flow towards the liver. IVC: No abnormality visualized. Pancreas: Visualized portion unremarkable. Spleen: Enlarged with a craniocaudal length of 14 cm and a splenic volume of 743 mL. Right Kidney: Length: 13.8 cm. Echogenicity within normal limits. No mass or hydronephrosis visualized. Left Kidney: Length: 15.6 cm. Echogenicity within normal limits. No mass or hydronephrosis visualized. Abdominal aorta: No aneurysm visualized. Other findings: None. IMPRESSION: Splenomegaly as above. Dilated common bile duct is stable since prior CTs dating back to 2017 compatible with post cholecystectomy state. Electronically Signed   By: Rolm Baptise M.D.   On: 09/20/2017 11:47   Ct Abdomen Pelvis W Contrast  Result Date: 09/20/2017 CLINICAL DATA:  Nausea and vomiting starting today. Abdominal pain. Slip and fall injury striking the lower back. EXAM: CT ABDOMEN AND PELVIS WITH CONTRAST TECHNIQUE: Multidetector CT imaging of the abdomen and pelvis was performed using the standard protocol following bolus administration of intravenous contrast. CONTRAST:  120mL ISOVUE-300 IOPAMIDOL (ISOVUE-300) INJECTION 61% COMPARISON:  11/22/2016 FINDINGS: Lower chest: Lung bases are clear. Hepatobiliary: No focal liver abnormality is seen.  Status post cholecystectomy. No biliary dilatation. Pancreas: Unremarkable. No pancreatic ductal dilatation or surrounding inflammatory changes. Spleen: Normal in size without focal abnormality. Adrenals/Urinary Tract: Adrenal glands are unremarkable. Kidneys are normal, without renal calculi, focal lesion, or hydronephrosis. Bladder is unremarkable. Stomach/Bowel: Stomach is within normal limits. Appendix appears normal. No evidence of bowel wall thickening, distention, or inflammatory changes. Vascular/Lymphatic: No significant vascular findings are present. No enlarged abdominal or pelvic lymph nodes. Reproductive: Uterus is anteverted and is not enlarged. Simple appearing cyst on the right ovary measuring 3.3 cm diameter. Decreasing in size since previous study. Dermoid cyst in the left ovary measuring 3.4 cm diameter without change. Other: No free air or free fluid in the abdomen. Musculoskeletal: Infiltration in the subcutaneous fat lateral to the right pelvis consistent with soft tissue contusion. No loculated collections. No acute fractures identified. IMPRESSION: 1. Soft tissue infiltration consistent with contusion lateral to the right pelvis. 2. Simple appearing right ovarian cyst without change since previous study. Dermoid cyst in the left ovary without change. 3. No evidence of bowel obstruction or inflammation. Electronically Signed   By: Lucienne Capers M.D.   On: 09/20/2017 01:20     Microbiology: Recent Results (from the past 240 hour(s))  Urine culture     Status: Abnormal   Collection Time: 09/20/17 12:01 AM  Result Value Ref Range Status   Specimen Description URINE, CLEAN CATCH  Final   Special Requests Normal  Final   Culture MULTIPLE SPECIES PRESENT, SUGGEST RECOLLECTION (A)  Final   Report Status 09/21/2017 FINAL  Final  Culture, blood (routine x 2)     Status: Abnormal   Collection Time: 09/20/17 12:28 AM  Result Value  Ref Range Status   Specimen Description BLOOD LEFT ARM   Final   Special Requests   Final    BOTTLES DRAWN AEROBIC AND ANAEROBIC Blood Culture adequate volume   Culture  Setup Time   Final    GRAM POSITIVE COCCI IN CHAINS AEROBIC BOTTLE ONLY Gram Stain Report Called to,Read Back By and Verified With: HAWKINS,M @ 0175 ON 11.21.18 BY BOWMAN,L CRITICAL RESULT CALLED TO, READ BACK BY AND VERIFIED WITH: L.Seay Pharm.D. 17:20 09/20/17 ([email or web address]) Performed at [facility], [place] [address], [place], [place] [address]    Culture GROUP B STREP(S.AGALACTIAE)ISOLATED (A)  Final   Report Status 09/22/2017 FINAL  Final   Organism ID, Bacteria GROUP B STREP(S.AGALACTIAE)ISOLATED  Final      Susceptibility   Group b strep(s.agalactiae)isolated - MIC*    CLINDAMYCIN <=0.25 SENSITIVE Sensitive     AMPICILLIN <=0.25 SENSITIVE Sensitive     ERYTHROMYCIN 2 RESISTANT Resistant     VANCOMYCIN 0.5 SENSITIVE Sensitive     CEFTRIAXONE <=0.12 SENSITIVE Sensitive     LEVOFLOXACIN 1 SENSITIVE Sensitive     * GROUP B STREP(S.AGALACTIAE)ISOLATED  Blood Culture ID Panel (Reflexed)     Status: Abnormal   Collection Time: 09/20/17 12:28 AM  Result Value Ref Range Status   Enterococcus species NOT DETECTED NOT DETECTED Final   Listeria monocytogenes NOT DETECTED NOT DETECTED Final   Staphylococcus species NOT DETECTED NOT DETECTED Final   Staphylococcus aureus NOT DETECTED NOT DETECTED Final   Streptococcus species DETECTED (A) NOT DETECTED Final    Comment: CRITICAL RESULT CALLED TO, READ BACK BY AND VERIFIED WITH: L. Seay Pharm.D. 17:20 09/20/17 ([email or web address])    Streptococcus agalactiae DETECTED (A) NOT DETECTED Final    Comment: CRITICAL RESULT CALLED TO, READ BACK BY AND VERIFIED WITH: L. Seay Pharm.D. 17:20 09/20/17 ([email or web address])    Streptococcus pneumoniae NOT DETECTED NOT DETECTED Final   Streptococcus pyogenes NOT DETECTED NOT DETECTED Final   Acinetobacter baumannii NOT DETECTED NOT DETECTED Final   Enterobacteriaceae species NOT DETECTED NOT DETECTED Final    Enterobacter cloacae complex NOT DETECTED NOT DETECTED Final   Escherichia coli NOT DETECTED NOT DETECTED Final   Klebsiella oxytoca NOT DETECTED NOT DETECTED Final   Klebsiella pneumoniae NOT DETECTED NOT DETECTED Final   Proteus species NOT DETECTED NOT DETECTED Final   Serratia marcescens NOT DETECTED NOT DETECTED Final   Haemophilus influenzae NOT DETECTED NOT DETECTED Final   Neisseria meningitidis NOT DETECTED NOT DETECTED Final   Pseudomonas aeruginosa NOT DETECTED NOT DETECTED Final   Candida albicans NOT DETECTED NOT DETECTED Final   Candida glabrata NOT DETECTED NOT DETECTED Final   Candida krusei NOT DETECTED NOT DETECTED Final   Candida parapsilosis NOT DETECTED NOT DETECTED Final   Candida tropicalis NOT DETECTED NOT DETECTED Final    Comment: Performed at [facility], [place] [address] [place], [place] [address]  Culture, blood (routine x 2)     Status: None   Collection Time: 09/20/17 12:39 AM  Result Value Ref Range Status   Specimen Description BLOOD LEFT HAND  Final   Special Requests   Final    BOTTLES DRAWN AEROBIC AND ANAEROBIC Blood Culture adequate volume   Culture NO GROWTH 5 DAYS  Final   Report Status 09/25/2017 FINAL  Final     Labs: Basic Metabolic Panel: Recent Labs  Lab 09/20/17 0005 09/20/17 0742 09/21/17 0458 09/22/17 0719 09/23/17 0630 09/24/17 0657  NA 137 134* 134* 132* 133* 136  K 3.3*  3.1* 3.5 3.9 3.4* 3.9  CL 104 104 107 103 102 105  CO2 25 21* 23 22 25 25   GLUCOSE 110* 115* 91 89 87 163*  BUN 5* <5* 7 5* 7 9  CREATININE 0.33* 0.30* <0.30* 0.30* 0.33* 0.32*  CALCIUM 8.1* 7.3* 7.6* 8.0* 8.2* 9.2  MG 1.5*  --   --  1.6* 1.6* 2.0   Liver Function Tests: Recent Labs  Lab 09/20/17 0005 09/20/17 0742 09/21/17 0458 09/22/17 0719 09/25/17 0543  AST 120* 93* 67* 65* 47*  ALT 29 22 20 21 20   ALKPHOS 103 92 86 93 141*  BILITOT 5.6* 5.1* 3.6* 3.7* 2.9*  PROT 7.7 6.5 6.3* 6.6 6.2*  ALBUMIN 3.7 3.1* 3.0* 3.2* 3.0*   Recent Labs    Lab 09/20/17 0005  LIPASE 28   Recent Labs  Lab 09/20/17 0005  AMMONIA 51*   CBC: Recent Labs  Lab 09/20/17 0005  09/21/17 0458 09/22/17 0719 09/23/17 0630 09/24/17 0657 09/25/17 0543  WBC 7.1   < > 4.8 5.2 4.1 6.9 5.3  NEUTROABS 5.6  --   --   --   --   --   --   HGB 9.6*   < > 7.6* 8.2* 8.3* 10.0* 8.7*  HCT 29.9*   < > 24.4* 26.2* 26.4* 31.1* 27.5*  MCV 82.4   < > 83.6 84.2 85.2 84.1 85.4  PLT 30*   < > 20* 33* 37* 50* 51*   < > = values in this interval not displayed.   Cardiac Enzymes: Recent Labs  Lab 09/20/17 0936  CKTOTAL 157   BNP: Invalid input(s): POCBNP CBG: No results for input(s): GLUCAP in the last 168 hours.  Time coordinating discharge:  Greater than 30 minutes  Signed:  Tarig Zimmers, DO [facility] Pager: [phone] 09/25/2017, 6:08 PM

## 2017-09-25 NOTE — Care Management (Signed)
[facility] rep, Blake Divine, aware of DC today. Pt's IV abx scheduled to start 09/26/17.

## 2017-09-25 NOTE — Progress Notes (Signed)
Pts PICC line bleeding slightly through the bottom of pressure dressing. Pressure dressing added to prior, to stop bleeding. Will continue to monitor

## 2017-09-26 ENCOUNTER — Telehealth: Payer: Self-pay | Admitting: Gastroenterology

## 2017-09-26 ENCOUNTER — Encounter: Payer: Self-pay | Admitting: Gastroenterology

## 2017-09-26 DIAGNOSIS — A401 Sepsis due to streptococcus, group B: Principal | ICD-10-CM

## 2017-09-26 DIAGNOSIS — F10920 Alcohol use, unspecified with intoxication, uncomplicated: Secondary | ICD-10-CM

## 2017-09-26 DIAGNOSIS — D65 Disseminated intravascular coagulation [defibrination syndrome]: Secondary | ICD-10-CM

## 2017-09-26 DIAGNOSIS — E876 Hypokalemia: Secondary | ICD-10-CM

## 2017-09-26 DIAGNOSIS — R7881 Bacteremia: Secondary | ICD-10-CM

## 2017-09-26 DIAGNOSIS — E7220 Disorder of urea cycle metabolism, unspecified: Secondary | ICD-10-CM

## 2017-09-26 LAB — CBC
HCT: 27.3 % — ABNORMAL LOW (ref 36.0–46.0)
HEMOGLOBIN: 8.7 g/dL — AB (ref 12.0–15.0)
MCH: 27.2 pg (ref 26.0–34.0)
MCHC: 31.9 g/dL (ref 30.0–36.0)
MCV: 85.3 fL (ref 78.0–100.0)
Platelets: 62 10*3/uL — ABNORMAL LOW (ref 150–400)
RBC: 3.2 MIL/uL — AB (ref 3.87–5.11)
RDW: 22.3 % — ABNORMAL HIGH (ref 11.5–15.5)
WBC: 4.8 10*3/uL (ref 4.0–10.5)

## 2017-09-26 NOTE — Progress Notes (Signed)
Rn assessed pts L arm where bleeding from PICC line was occurring. No bleeding after pressure dressing reinforced earlier in the shift. Will continue to monitor pt

## 2017-09-26 NOTE — Telephone Encounter (Signed)
Patient needs hospital follow-up in 4 weeks. Reason: alcoholic liver disease, hospital follow-up.

## 2017-09-26 NOTE — Care Management (Signed)
Discharged delayed yesterday. [facility] aware. Victoria Gordon, [facility] rep, aware of DC this AM. Pt will be home for scheduled first infusion of IV abx with [facility] nurse. IV abx were delivered to home last night per [facility] rep.

## 2017-09-26 NOTE — Telephone Encounter (Signed)
PATIENT SCHEDULED  °

## 2017-09-26 NOTE — Progress Notes (Addendum)
REVIEWED-NO ADDITIONAL RECOMMENDATIONS.   Subjective: No N/V. No abdominal pain. No overt GI bleeding. Discharge paperwork in process. Desires to follow-up in [place] with [facility].   Objective: Vital signs in last 24 hours: Temp:  [98.2 F (36.8 C)-98.7 F (37.1 C)] 98.2 F (36.8 C) (11/27 0500) Pulse Rate:  [84-99] 84 (11/27 0500) Resp:  [16-20] 16 (11/27 0500) BP: (116-123)/(70-76) 116/70 (11/27 0500) SpO2:  [99 %-100 %] 99 % (11/27 0500) Last BM Date: 09/23/17 General:   Alert and oriented, pleasant Head:  Normocephalic and atraumatic. Eyes:  No icterus, sclera clear. Conjuctiva pink.  Abdomen:  Bowel sounds present, soft, non-tender, non-distended. No HSM or hernias noted. No rebound or guarding. No masses appreciated  Extremities:  Without  edema. Neurologic:  Alert and  oriented x4 Psych:  Alert and cooperative. Normal mood and affect.  Intake/Output from previous day: 11/26 0701 - 11/27 0700 In: 530 [P.O.:480; IV Piggyback:50] Out: -  Intake/Output this shift: Total I/O In: 360 [P.O.:360] Out: -   Lab Results: Recent Labs    09/24/17 0657 09/25/17 0543 09/26/17 0548  WBC 6.9 5.3 4.8  HGB 10.0* 8.7* 8.7*  HCT 31.1* 27.5* 27.3*  PLT 50* 51* 62*   BMET Recent Labs    09/24/17 0657  NA 136  K 3.9  CL 105  CO2 25  GLUCOSE 163*  BUN 9  CREATININE 0.32*  CALCIUM 9.2   LFT Recent Labs    09/25/17 0543  PROT 6.2*  ALBUMIN 3.0*  AST 47*  ALT 20  ALKPHOS 141*  BILITOT 2.9*  BILIDIR 0.9*  IBILI 2.0*   PT/INR Recent Labs    09/25/17 0543  LABPROT 20.3*  INR 1.75   Assessment: 36 year old female admitted with group B bacteremia and partially decompensated liver disease in setting of alcohol abuse. Clinically improved with discharge today. I discussed again with patient absolute cessation of alcohol and risks of death if continued. She desires to continue follow-up care here in [place] at [facility]. Will arrange outpatient follow-up for her.    Plan: Discharge home today BID PPI Follow-up in 4 weeks as outpatient Absolute alcohol cessation   Annitta Needs, PhD, ANP-BC [facility]    LOS: 6 days    09/26/2017, 9:33 AM

## 2017-09-26 NOTE — Progress Notes (Signed)
AVS discussed with pt.  Pt given prescription for IV Rocphin and ensure feeding to provide to home care/pharmacy. Marland Kitchen  PICC remains in place for IV home antibiotics.  Pt instructed to take precaution with PICC in left arm. Pt verbalizes understanding.   Pt walked down to car.  Pt stable upon discharage.

## 2017-09-26 NOTE — Discharge Summary (Signed)
Physician Discharge Summary  Victoria Gordon ZOX:[id number] DOB: 05/26/1981 DOA: 09/19/2017  PCP: Patient, No Pcp Per  Admit date: 09/19/2017 Discharge date: 09/26/2017   Admitted From: Home Disposition:  Home  Recommendations for Outpatient Follow-up:  1. Follow up with PCP in 1-2 weeks 2. Please obtain BMP/CBC in one week   Home Health: YES Equipment/Devices: IV ceftriaxone through 10/03/17  Discharge Condition: Stable CODE STATUS: FULL Diet recommendation: Heart Healthy    Brief/Interim Summary: 36 year old female with a history of alcoholic abuse with alcoholic liver injury, PTSD, depression presenting with 2-3-day history of right-sided abdominal pain and one day history of nausea and vomiting. The patient had previously drank on a daily basis for over 2 years, but reportedly stopped drinking on Mar 14, 2017. Since then, she has restarted it. She states that she only drinks 1 sixpack of beer per week. She could not tell me how long she has been drinking although it was previously documented from her previous records that she drank over 2 years. The patient had a mechanical fall on September 17, 2017 tripping over a speaker and falling onto her right side. She had another fall on September 18, 2017 falling in the bathtub. She denied any loss of consciousness. She had complained of subjective fevers and chills at home. She denied any headache, chest pain, shortness breath, cough, hemoptysis, hematemesis, hematochezia, melena.Alcohol level in the emergency department was 222. Since admission, the patient began having fevers up to 101.3 F.The patient was started on empiric Cubicin and Zosyn. Blood cultures grew group B streptococcus. Antibiotics were de-escalated to ceftriaxone.  She subsequently developed what she initially thought to be hematochezia.  She subsequently realized that she was starting her menses.  Nevertheless, GI was consulted.  They did not feel any  further invasive intervention was warranted.  Her Hgb remained stable.  GI recommended transfusing one unit PRBCs.  A PICC line was placed for home IV abx.  The patient was tentatively scheduled to be discharged on September 25, 2017.  However, the patient had another bleeding episode from her left upper extremity PICC line.  Therefore, the patient was observed overnight.  A pressure dressing was placed on the left upper extremity.  Repeat CBC showed that her hemoglobin remained stable and platelets continued to increase.  Subsequently, the patient was discharged in stable condition on September 26, 2017.    Discharge Diagnoses:  Sepsis -due to bacteremia -Lactic acid 2.8>>>0.8 -Initially startedZosyn -Initially startedCubicin as patient developed hives to vancomycin -Urinalysis negative for pyuria -Chest x-ray negative for infiltrates -Follow blood cultures-->group B streptococcus -Continue IV fluids-->saline lock with diet advancement -influenza pcr--neg  Group B Streptococcus Bacteremia -d/c zosyn and cubicin -continueceftriaxone--tentative stop date October 04, 2017 (14 days tx) -Echo--EF 60-65%, no WMA, mild MR, PAS P 47, no vegetation  Hypersensitivity Reaction to IV Vitamin K -11/24--hives and SOB with vitamin K infusion -improved with IV benadryl and solumedrol -D/C vitamin K  Coagulopathy/low grade DIC -Likely due to alcoholic liver diseaseand DIC -Hematologyconsult appreciated -INR 2.03, PTT37fibrinogen 183 -Low haptoglobin likely secondary to chronic liver disease;LDH normal -Monitor for signs of bleeding-->hematochezia? -mild bleeding at PICC line site-->pressure dressing-->improved  Hematochezia? -initially felt to be rectal blood, now pt feels this was vaginal from menses -GI consultappreciated -Hgb stable -baseline Hgb 8-9 -recent admit at OSH 05/11/17--hematochezia-->underwent EGD showing no varices but portal hypertensive gastropathy and flexible  sigmoidoscopy showing external hemorrhoids. -[facility] --felt tohavehemorrhoidal bleed status post sclerotherapy and suture fixation of hemorrhoids EGD -  1 unit PRBC per GI--11/24 -Hgb 8.7 on day of discharge  Intractable vomiting -Likely degree of alcoholic gastritis and possibly esophagitis -continuePPI-->change to po -improved -lipase 28 -Advanced tosoft diet which the patient tolerated  Abdominal pain -September 20, 2017 CT abdomen pelvis--infiltration of subcutaneous fat on the lateral right pelvis consistent with soft tissue contusion -Likely secondary to mechanical fall -improved  Alcohol liver disease -Patient had liver biopsy June2,2018 -showed marked hepatocyte injury, mild steatohepatitis with extensive pericellular fibrosis stage 2 of 4 (no bridging fibrosis)  Alcohol abuse -Alcohol withdrawal protocol -no signs of withdrawal  Thrombocytopenia -Likely secondary to alcoholic liver disease with acute worsening due to infectious processand low grade DIC -improving with tx of infection--platelets 62K on day of d/c -B12--924 -TSH/Free T4--suggestive of euthyroid sick syndrome  Hypokalemia/hypomagnesemia -Repleted  Hyperammonemia -No altered mental status -Likely secondary to chronic liver disease     Discharge Instructions  Discharge Instructions    Diet - low sodium heart healthy   Complete by:  As directed    Home infusion instructions [facility] May follow [place] Dosing Protocol; May administer Cathflo as needed to maintain patency of vascular access device.; [place] of vascular access device: per [facility] Protocol: 0.9% NaCl pre/post medica...   Complete by:  As directed    Instructions:  May follow [place] Dosing Protocol   Instructions:  May administer Cathflo as needed to maintain patency of vascular access device.   Instructions:  Flushing of vascular access device: per [facility] Protocol: 0.9% NaCl pre/post  medication administration and prn patency; Heparin 100 u/ml, 79ml for implanted ports and Heparin 10u/ml, 19ml for all other central venous catheters.   Instructions:  May follow [facility] Anaphylaxis Protocol for First Dose Administration in the home: 0.9% NaCl at 25-50 ml/hr to maintain IV access for protocol meds. Epinephrine 0.3 ml IV/IM PRN and Benadryl 25-50 IV/IM PRN s/s of anaphylaxis.   Instructions:  [place] Infusion Coordinator (RN) to assist per patient IV care needs in the home PRN.   Increase activity slowly   Complete by:  As directed      Allergies as of 09/26/2017      Reactions   Vitamin K And Related Hives, Shortness Of Breath, Itching, Rash   Vancomycin Hives, Itching      Medication List    STOP taking these medications   traZODone 150 MG tablet Commonly known as:  DESYREL     TAKE these medications   cefTRIAXone 2 g in dextrose 5 % 50 mL Inject 2 g into the vein daily. Start 09/26/17.  Last dose to be given on 10/03/2017.   feeding supplement (ENSURE ENLIVE) Liqd Take 237 mLs by mouth 2 (two) times daily between meals.   furosemide 20 MG tablet Commonly known as:  LASIX Take 2 tablets (40 mg total) by mouth daily. Notes to patient:  Use per home regimen    hydrocortisone 2.5 % rectal cream Commonly known as:  ANUSOL-HC Place rectally 2 (two) times daily at 10 AM and 5 PM.   pantoprazole 40 MG tablet Commonly known as:  PROTONIX Take 1 tablet (40 mg total) by mouth 2 (two) times daily before a meal.   potassium chloride 10 MEQ CR capsule Commonly known as:  MICRO-K Take 2 capsules by mouth daily.   propranolol 20 MG tablet Commonly known as:  INDERAL Take 20 mg by mouth 2 (two) times daily. Notes to patient:  Take per home regimen   sertraline 100 MG  tablet Commonly known as:  ZOLOFT Take 1 tablet (100 mg total) by mouth daily. For depression   spironolactone 100 MG tablet Commonly known as:  ALDACTONE Take 1 tablet (100 mg total) by  mouth daily. Notes to patient:  Take per home regimen    thiamine 100 MG tablet Take 100 mg by mouth daily.            Home Infusion Instuctions  (From admission, onward)        Start     Ordered   09/22/17 0000  Home infusion instructions [facility] May follow [place] Dosing Protocol; May administer Cathflo as needed to maintain patency of vascular access device.; Flushing of vascular access device: per [facility] Protocol: 0.9% NaCl pre/post medica...    Question Answer Comment  Instructions May follow [place] Dosing Protocol   Instructions May administer Cathflo as needed to maintain patency of vascular access device.   Instructions Flushing of vascular access device: per [facility] Protocol: 0.9% NaCl pre/post medication administration and prn patency; Heparin 100 u/ml, 60ml for implanted ports and Heparin 10u/ml, 67ml for all other central venous catheters.   Instructions May follow [facility] Anaphylaxis Protocol for First Dose Administration in the home: 0.9% NaCl at 25-50 ml/hr to maintain IV access for protocol meds. Epinephrine 0.3 ml IV/IM PRN and Benadryl 25-50 IV/IM PRN s/s of anaphylaxis.   Instructions [facility] Infusion Coordinator (RN) to assist per patient IV care needs in the home PRN.      09/22/17 1544     Follow-up Information    [facility], [facility]-Home Follow up.   Specialty:  Home Health Services Contact information: [place] [address] [phone]          Allergies  Allergen Reactions  . Vitamin K And Related Hives, Shortness Of Breath, Itching and Rash  . Vancomycin Hives and Itching    Consultations:  GI--Fields   Procedures/Studies: Dg Chest 2 View  Result Date: 09/20/2017 CLINICAL DATA:  Shortness of breath and cough since yesterday. EXAM: CHEST  2 VIEW COMPARISON:  06/09/2004 FINDINGS: The heart size and mediastinal contours are within normal limits. Both lungs are clear. The visualized skeletal  structures are unremarkable. Surgical clips in the right upper quadrant. IMPRESSION: No active cardiopulmonary disease. Electronically Signed   By: Lucienne Capers M.D.   On: 09/20/2017 02:39   Ct Head Wo Contrast  Result Date: 09/20/2017 CLINICAL DATA:  36 year old female with head trauma. EXAM: CT HEAD WITHOUT CONTRAST CT CERVICAL SPINE WITHOUT CONTRAST TECHNIQUE: Multidetector CT imaging of the head and cervical spine was performed following the standard protocol without intravenous contrast. Multiplanar CT image reconstructions of the cervical spine were also generated. COMPARISON:  Head CT dated 02/07/2016 FINDINGS: CT HEAD FINDINGS Brain: No evidence of acute infarction, hemorrhage, hydrocephalus, extra-axial collection or mass lesion/mass effect. Vascular: No hyperdense vessel or unexpected calcification. Skull: Normal. Negative for fracture or focal lesion. Sinuses/Orbits: Mild mucoperiosteal thickening of paranasal sinuses. No air-fluid level. The mastoid air cells are clear. Other: None CT CERVICAL SPINE FINDINGS Alignment: No acute subluxation. Mild reversal of normal cervical lordosis which may be positional or due to muscle spasm. Skull base and vertebrae: No acute fracture. No primary bone lesion or focal pathologic process. Soft tissues and spinal canal: No prevertebral fluid or swelling. No visible canal hematoma. Disc levels:  Mild degenerative changes and disc bulge at C5-C6. Upper chest: Negative. Other: None IMPRESSION: 1. Unremarkable noncontrast CT of the  brain. 2. No acute/traumatic cervical spine pathology. Electronically Signed   By: Anner Crete M.D.   On: 09/20/2017 01:23   Ct Cervical Spine Wo Contrast  Result Date: 09/20/2017 CLINICAL DATA:  36 year old female with head trauma. EXAM: CT HEAD WITHOUT CONTRAST CT CERVICAL SPINE WITHOUT CONTRAST TECHNIQUE: Multidetector CT imaging of the head and cervical spine was performed following the standard protocol without intravenous  contrast. Multiplanar CT image reconstructions of the cervical spine were also generated. COMPARISON:  Head CT dated 02/07/2016 FINDINGS: CT HEAD FINDINGS Brain: No evidence of acute infarction, hemorrhage, hydrocephalus, extra-axial collection or mass lesion/mass effect. Vascular: No hyperdense vessel or unexpected calcification. Skull: Normal. Negative for fracture or focal lesion. Sinuses/Orbits: Mild mucoperiosteal thickening of paranasal sinuses. No air-fluid level. The mastoid air cells are clear. Other: None CT CERVICAL SPINE FINDINGS Alignment: No acute subluxation. Mild reversal of normal cervical lordosis which may be positional or due to muscle spasm. Skull base and vertebrae: No acute fracture. No primary bone lesion or focal pathologic process. Soft tissues and spinal canal: No prevertebral fluid or swelling. No visible canal hematoma. Disc levels:  Mild degenerative changes and disc bulge at C5-C6. Upper chest: Negative. Other: None IMPRESSION: 1. Unremarkable noncontrast CT of the brain. 2. No acute/traumatic cervical spine pathology. Electronically Signed   By: Anner Crete M.D.   On: 09/20/2017 01:23   US Abdomen Complete  Result Date: 09/20/2017 CLINICAL DATA:  Thrombocytopenia EXAM: ABDOMEN ULTRASOUND COMPLETE COMPARISON:  CT 09/20/2017 FINDINGS: Gallbladder: Prior cholecystectomy Common bile duct: Diameter: Dilated, 11 mm, stable dating back to 2017 compatible with post cholecystectomy state. Liver: No focal lesion identified. Within normal limits in parenchymal echogenicity. Portal vein is patent on color Doppler imaging with normal direction of blood flow towards the liver. IVC: No abnormality visualized. Pancreas: Visualized portion unremarkable. Spleen: Enlarged with a craniocaudal length of 14 cm and a splenic volume of 743 mL. Right Kidney: Length: 13.8 cm. Echogenicity within normal limits. No mass or hydronephrosis visualized. Left Kidney: Length: 15.6 cm. Echogenicity within  normal limits. No mass or hydronephrosis visualized. Abdominal aorta: No aneurysm visualized. Other findings: None. IMPRESSION: Splenomegaly as above. Dilated common bile duct is stable since prior CTs dating back to 2017 compatible with post cholecystectomy state. Electronically Signed   By: Rolm Baptise M.D.   On: 09/20/2017 11:47   Ct Abdomen Pelvis W Contrast  Result Date: 09/20/2017 CLINICAL DATA:  Nausea and vomiting starting today. Abdominal pain. Slip and fall injury striking the lower back. EXAM: CT ABDOMEN AND PELVIS WITH CONTRAST TECHNIQUE: Multidetector CT imaging of the abdomen and pelvis was performed using the standard protocol following bolus administration of intravenous contrast. CONTRAST:  138mL ISOVUE-300 IOPAMIDOL (ISOVUE-300) INJECTION 61% COMPARISON:  11/22/2016 FINDINGS: Lower chest: Lung bases are clear. Hepatobiliary: No focal liver abnormality is seen. Status post cholecystectomy. No biliary dilatation. Pancreas: Unremarkable. No pancreatic ductal dilatation or surrounding inflammatory changes. Spleen: Normal in size without focal abnormality. Adrenals/Urinary Tract: Adrenal glands are unremarkable. Kidneys are normal, without renal calculi, focal lesion, or hydronephrosis. Bladder is unremarkable. Stomach/Bowel: Stomach is within normal limits. Appendix appears normal. No evidence of bowel wall thickening, distention, or inflammatory changes. Vascular/Lymphatic: No significant vascular findings are present. No enlarged abdominal or pelvic lymph nodes. Reproductive: Uterus is anteverted and is not enlarged. Simple appearing cyst on the right ovary measuring 3.3 cm diameter. Decreasing in size since previous study. Dermoid cyst in the left ovary measuring 3.4 cm diameter without change. Other: No free air or  free fluid in the abdomen. Musculoskeletal: Infiltration in the subcutaneous fat lateral to the right pelvis consistent with soft tissue contusion. No loculated collections. No  acute fractures identified. IMPRESSION: 1. Soft tissue infiltration consistent with contusion lateral to the right pelvis. 2. Simple appearing right ovarian cyst without change since previous study. Dermoid cyst in the left ovary without change. 3. No evidence of bowel obstruction or inflammation. Electronically Signed   By: Lucienne Capers M.D.   On: 09/20/2017 01:20        Discharge Exam: Vitals:   09/25/17 2200 09/26/17 0500  BP: 119/76 116/70  Pulse: 87 84  Resp: 20 16  Temp: 98.7 F (37.1 C) 98.2 F (36.8 C)  SpO2: 100% 99%   Vitals:   09/25/17 1200 09/25/17 1749 09/25/17 2200 09/26/17 0500  BP: 123/75 118/70 119/76 116/70  Pulse: 99 85 87 84  Resp:   20 16  Temp:   98.7 F (37.1 C) 98.2 F (36.8 C)  TempSrc:   Oral Oral  SpO2:   100% 99%  Weight:      Height:        General: Pt is alert, awake, not in acute distress Cardiovascular: RRR, S1/S2 +, no rubs, no gallops Respiratory: CTA bilaterally, no wheezing, no rhonchi Abdominal: Soft, NT, ND, bowel sounds + Extremities: no edema, no cyanosis   The results of significant diagnostics from this hospitalization (including imaging, microbiology, ancillary and laboratory) are listed below for reference.    Significant Diagnostic Studies: Dg Chest 2 View  Result Date: 09/20/2017 CLINICAL DATA:  Shortness of breath and cough since yesterday. EXAM: CHEST  2 VIEW COMPARISON:  06/09/2004 FINDINGS: The heart size and mediastinal contours are within normal limits. Both lungs are clear. The visualized skeletal structures are unremarkable. Surgical clips in the right upper quadrant. IMPRESSION: No active cardiopulmonary disease. Electronically Signed   By: Lucienne Capers M.D.   On: 09/20/2017 02:39   Ct Head Wo Contrast  Result Date: 09/20/2017 CLINICAL DATA:  36 year old female with head trauma. EXAM: CT HEAD WITHOUT CONTRAST CT CERVICAL SPINE WITHOUT CONTRAST TECHNIQUE: Multidetector CT imaging of the head and cervical  spine was performed following the standard protocol without intravenous contrast. Multiplanar CT image reconstructions of the cervical spine were also generated. COMPARISON:  Head CT dated 02/07/2016 FINDINGS: CT HEAD FINDINGS Brain: No evidence of acute infarction, hemorrhage, hydrocephalus, extra-axial collection or mass lesion/mass effect. Vascular: No hyperdense vessel or unexpected calcification. Skull: Normal. Negative for fracture or focal lesion. Sinuses/Orbits: Mild mucoperiosteal thickening of paranasal sinuses. No air-fluid level. The mastoid air cells are clear. Other: None CT CERVICAL SPINE FINDINGS Alignment: No acute subluxation. Mild reversal of normal cervical lordosis which may be positional or due to muscle spasm. Skull base and vertebrae: No acute fracture. No primary bone lesion or focal pathologic process. Soft tissues and spinal canal: No prevertebral fluid or swelling. No visible canal hematoma. Disc levels:  Mild degenerative changes and disc bulge at C5-C6. Upper chest: Negative. Other: None IMPRESSION: 1. Unremarkable noncontrast CT of the brain. 2. No acute/traumatic cervical spine pathology. Electronically Signed   By: Anner Crete M.D.   On: 09/20/2017 01:23   Ct Cervical Spine Wo Contrast  Result Date: 09/20/2017 CLINICAL DATA:  36 year old female with head trauma. EXAM: CT HEAD WITHOUT CONTRAST CT CERVICAL SPINE WITHOUT CONTRAST TECHNIQUE: Multidetector CT imaging of the head and cervical spine was performed following the standard protocol without intravenous contrast. Multiplanar CT image reconstructions of the cervical spine were  also generated. COMPARISON:  Head CT dated 02/07/2016 FINDINGS: CT HEAD FINDINGS Brain: No evidence of acute infarction, hemorrhage, hydrocephalus, extra-axial collection or mass lesion/mass effect. Vascular: No hyperdense vessel or unexpected calcification. Skull: Normal. Negative for fracture or focal lesion. Sinuses/Orbits: Mild mucoperiosteal  thickening of paranasal sinuses. No air-fluid level. The mastoid air cells are clear. Other: None CT CERVICAL SPINE FINDINGS Alignment: No acute subluxation. Mild reversal of normal cervical lordosis which may be positional or due to muscle spasm. Skull base and vertebrae: No acute fracture. No primary bone lesion or focal pathologic process. Soft tissues and spinal canal: No prevertebral fluid or swelling. No visible canal hematoma. Disc levels:  Mild degenerative changes and disc bulge at C5-C6. Upper chest: Negative. Other: None IMPRESSION: 1. Unremarkable noncontrast CT of the brain. 2. No acute/traumatic cervical spine pathology. Electronically Signed   By: Anner Crete M.D.   On: 09/20/2017 01:23   US Abdomen Complete  Result Date: 09/20/2017 CLINICAL DATA:  Thrombocytopenia EXAM: ABDOMEN ULTRASOUND COMPLETE COMPARISON:  CT 09/20/2017 FINDINGS: Gallbladder: Prior cholecystectomy Common bile duct: Diameter: Dilated, 11 mm, stable dating back to 2017 compatible with post cholecystectomy state. Liver: No focal lesion identified. Within normal limits in parenchymal echogenicity. Portal vein is patent on color Doppler imaging with normal direction of blood flow towards the liver. IVC: No abnormality visualized. Pancreas: Visualized portion unremarkable. Spleen: Enlarged with a craniocaudal length of 14 cm and a splenic volume of 743 mL. Right Kidney: Length: 13.8 cm. Echogenicity within normal limits. No mass or hydronephrosis visualized. Left Kidney: Length: 15.6 cm. Echogenicity within normal limits. No mass or hydronephrosis visualized. Abdominal aorta: No aneurysm visualized. Other findings: None. IMPRESSION: Splenomegaly as above. Dilated common bile duct is stable since prior CTs dating back to 2017 compatible with post cholecystectomy state. Electronically Signed   By: Rolm Baptise M.D.   On: 09/20/2017 11:47   Ct Abdomen Pelvis W Contrast  Result Date: 09/20/2017 CLINICAL DATA:  Nausea and  vomiting starting today. Abdominal pain. Slip and fall injury striking the lower back. EXAM: CT ABDOMEN AND PELVIS WITH CONTRAST TECHNIQUE: Multidetector CT imaging of the abdomen and pelvis was performed using the standard protocol following bolus administration of intravenous contrast. CONTRAST:  117mL ISOVUE-300 IOPAMIDOL (ISOVUE-300) INJECTION 61% COMPARISON:  11/22/2016 FINDINGS: Lower chest: Lung bases are clear. Hepatobiliary: No focal liver abnormality is seen. Status post cholecystectomy. No biliary dilatation. Pancreas: Unremarkable. No pancreatic ductal dilatation or surrounding inflammatory changes. Spleen: Normal in size without focal abnormality. Adrenals/Urinary Tract: Adrenal glands are unremarkable. Kidneys are normal, without renal calculi, focal lesion, or hydronephrosis. Bladder is unremarkable. Stomach/Bowel: Stomach is within normal limits. Appendix appears normal. No evidence of bowel wall thickening, distention, or inflammatory changes. Vascular/Lymphatic: No significant vascular findings are present. No enlarged abdominal or pelvic lymph nodes. Reproductive: Uterus is anteverted and is not enlarged. Simple appearing cyst on the right ovary measuring 3.3 cm diameter. Decreasing in size since previous study. Dermoid cyst in the left ovary measuring 3.4 cm diameter without change. Other: No free air or free fluid in the abdomen. Musculoskeletal: Infiltration in the subcutaneous fat lateral to the right pelvis consistent with soft tissue contusion. No loculated collections. No acute fractures identified. IMPRESSION: 1. Soft tissue infiltration consistent with contusion lateral to the right pelvis. 2. Simple appearing right ovarian cyst without change since previous study. Dermoid cyst in the left ovary without change. 3. No evidence of bowel obstruction or inflammation. Electronically Signed   By: Oren Beckmann.D.  On: 09/20/2017 01:20     Microbiology: Recent Results (from the past  240 hour(s))  Urine culture     Status: Abnormal   Collection Time: 09/20/17 12:01 AM  Result Value Ref Range Status   Specimen Description URINE, CLEAN CATCH  Final   Special Requests Normal  Final   Culture MULTIPLE SPECIES PRESENT, SUGGEST RECOLLECTION (A)  Final   Report Status 09/21/2017 FINAL  Final  Culture, blood (routine x 2)     Status: Abnormal   Collection Time: 09/20/17 12:28 AM  Result Value Ref Range Status   Specimen Description BLOOD LEFT ARM  Final   Special Requests   Final    BOTTLES DRAWN AEROBIC AND ANAEROBIC Blood Culture adequate volume   Culture  Setup Time   Final    GRAM POSITIVE COCCI IN CHAINS AEROBIC BOTTLE ONLY Gram Stain Report Called to,Read Back By and Verified With: HAWKINS,M @ [id number] ON 11.21.18 BY BOWMAN,L CRITICAL RESULT CALLED TO, READ BACK BY AND VERIFIED WITH: L.Seay Pharm.D. 17:20 09/20/17 ([email or web address]) Performed at [facility], [place] [address] [address], [place], [place] [address]    Culture GROUP B STREP(S.AGALACTIAE)ISOLATED (A)  Final   Report Status 09/22/2017 FINAL  Final   Organism ID, Bacteria GROUP B STREP(S.AGALACTIAE)ISOLATED  Final      Susceptibility   Group b strep(s.agalactiae)isolated - MIC*    CLINDAMYCIN <=0.25 SENSITIVE Sensitive     AMPICILLIN <=0.25 SENSITIVE Sensitive     ERYTHROMYCIN 2 RESISTANT Resistant     VANCOMYCIN 0.5 SENSITIVE Sensitive     CEFTRIAXONE <=0.12 SENSITIVE Sensitive     LEVOFLOXACIN 1 SENSITIVE Sensitive     * GROUP B STREP(S.AGALACTIAE)ISOLATED  Blood Culture ID Panel (Reflexed)     Status: Abnormal   Collection Time: 09/20/17 12:28 AM  Result Value Ref Range Status   Enterococcus species NOT DETECTED NOT DETECTED Final   Listeria monocytogenes NOT DETECTED NOT DETECTED Final   Staphylococcus species NOT DETECTED NOT DETECTED Final   Staphylococcus aureus NOT DETECTED NOT DETECTED Final   Streptococcus species DETECTED (A) NOT DETECTED Final    Comment: CRITICAL RESULT CALLED TO, READ BACK BY AND  VERIFIED WITH: L. Seay Pharm.D. 17:20 09/20/17 ([email or web address])    Streptococcus agalactiae DETECTED (A) NOT DETECTED Final    Comment: CRITICAL RESULT CALLED TO, READ BACK BY AND VERIFIED WITH: L. Seay Pharm.D. 17:20 09/20/17 ([email or web address])    Streptococcus pneumoniae NOT DETECTED NOT DETECTED Final   Streptococcus pyogenes NOT DETECTED NOT DETECTED Final   Acinetobacter baumannii NOT DETECTED NOT DETECTED Final   Enterobacteriaceae species NOT DETECTED NOT DETECTED Final   Enterobacter cloacae complex NOT DETECTED NOT DETECTED Final   Escherichia coli NOT DETECTED NOT DETECTED Final   Klebsiella oxytoca NOT DETECTED NOT DETECTED Final   Klebsiella pneumoniae NOT DETECTED NOT DETECTED Final   Proteus species NOT DETECTED NOT DETECTED Final   Serratia marcescens NOT DETECTED NOT DETECTED Final   Haemophilus influenzae NOT DETECTED NOT DETECTED Final   Neisseria meningitidis NOT DETECTED NOT DETECTED Final   Pseudomonas aeruginosa NOT DETECTED NOT DETECTED Final   Candida albicans NOT DETECTED NOT DETECTED Final   Candida glabrata NOT DETECTED NOT DETECTED Final   Candida krusei NOT DETECTED NOT DETECTED Final   Candida parapsilosis NOT DETECTED NOT DETECTED Final   Candida tropicalis NOT DETECTED NOT DETECTED Final    Comment: Performed at [facility], [place] [address], [place], [place] [address]  Culture, blood (routine x 2)     Status:  None   Collection Time: 09/20/17 12:39 AM  Result Value Ref Range Status   Specimen Description BLOOD LEFT HAND  Final   Special Requests   Final    BOTTLES DRAWN AEROBIC AND ANAEROBIC Blood Culture adequate volume   Culture NO GROWTH 5 DAYS  Final   Report Status 09/25/2017 FINAL  Final     Labs: Basic Metabolic Panel: Recent Labs  Lab 09/20/17 0005 09/20/17 0742 09/21/17 0458 09/22/17 0719 09/23/17 0630 09/24/17 0657  NA 137 134* 134* 132* 133* 136  K 3.3* 3.1* 3.5 3.9 3.4* 3.9  CL 104 104 107 103 102 105  CO2 25 21* 23 22 25 25   GLUCOSE  110* 115* 91 89 87 163*  BUN 5* <5* 7 5* 7 9  CREATININE 0.33* 0.30* <0.30* 0.30* 0.33* 0.32*  CALCIUM 8.1* 7.3* 7.6* 8.0* 8.2* 9.2  MG 1.5*  --   --  1.6* 1.6* 2.0   Liver Function Tests: Recent Labs  Lab 09/20/17 0005 09/20/17 0742 09/21/17 0458 09/22/17 0719 09/25/17 0543  AST 120* 93* 67* 65* 47*  ALT 29 22 20 21 20   ALKPHOS 103 92 86 93 141*  BILITOT 5.6* 5.1* 3.6* 3.7* 2.9*  PROT 7.7 6.5 6.3* 6.6 6.2*  ALBUMIN 3.7 3.1* 3.0* 3.2* 3.0*   Recent Labs  Lab 09/20/17 0005  LIPASE 28   Recent Labs  Lab 09/20/17 0005  AMMONIA 51*   CBC: Recent Labs  Lab 09/20/17 0005  09/22/17 0719 09/23/17 0630 09/24/17 0657 09/25/17 0543 09/26/17 0548  WBC 7.1   < > 5.2 4.1 6.9 5.3 4.8  NEUTROABS 5.6  --   --   --   --   --   --   HGB 9.6*   < > 8.2* 8.3* 10.0* 8.7* 8.7*  HCT 29.9*   < > 26.2* 26.4* 31.1* 27.5* 27.3*  MCV 82.4   < > 84.2 85.2 84.1 85.4 85.3  PLT 30*   < > 33* 37* 50* 51* 62*   < > = values in this interval not displayed.   Cardiac Enzymes: Recent Labs  Lab 09/20/17 0936  CKTOTAL 157   BNP: Invalid input(s): POCBNP CBG: No results for input(s): GLUCAP in the last 168 hours.  Time coordinating discharge:  Greater than 30 minutes  Signed:  Laiba Fuerte, DO [facility] Pager: [phone] 09/26/2017, 3:15 PM

## 2017-09-26 NOTE — Progress Notes (Signed)
Some bleeding noted during shift assessment of PICC in left arm. Additional  reinforced pressure dressing applied.

## 2017-11-13 ENCOUNTER — Encounter: Payer: Self-pay | Admitting: Gastroenterology

## 2017-11-13 ENCOUNTER — Telehealth: Payer: Self-pay | Admitting: Gastroenterology

## 2017-11-13 ENCOUNTER — Ambulatory Visit: Payer: Self-pay | Admitting: Gastroenterology

## 2017-11-13 NOTE — Telephone Encounter (Signed)
PATIENT WAS A NO SHOW AND LETTER SENT  °

## 2018-03-01 ENCOUNTER — Emergency Department (HOSPITAL_COMMUNITY)
Admission: EM | Admit: 2018-03-01 | Discharge: 2018-03-01 | Disposition: A | Discharge status: Home / Self Care | Payer: Self-pay | Attending: Emergency Medicine | Admitting: Emergency Medicine

## 2018-03-01 ENCOUNTER — Other Ambulatory Visit: Payer: Self-pay

## 2018-03-01 ENCOUNTER — Emergency Department (HOSPITAL_COMMUNITY): Payer: Self-pay

## 2018-03-01 ENCOUNTER — Encounter (HOSPITAL_COMMUNITY): Payer: Self-pay | Admitting: *Deleted

## 2018-03-01 DIAGNOSIS — Z79899 Other long term (current) drug therapy: Secondary | ICD-10-CM | POA: Insufficient documentation

## 2018-03-01 DIAGNOSIS — S63502A Unspecified sprain of left wrist, initial encounter: Secondary | ICD-10-CM

## 2018-03-01 DIAGNOSIS — Y929 Unspecified place or not applicable: Secondary | ICD-10-CM | POA: Insufficient documentation

## 2018-03-01 DIAGNOSIS — Y999 Unspecified external cause status: Secondary | ICD-10-CM | POA: Insufficient documentation

## 2018-03-01 DIAGNOSIS — Y939 Activity, unspecified: Secondary | ICD-10-CM | POA: Insufficient documentation

## 2018-03-01 MED ORDER — TRAMADOL HCL 50 MG PO TABS: 50.0000 mg | ORAL_TABLET | Freq: Four times a day (QID) | ORAL | 0 refills | Status: DC | PRN

## 2018-03-01 MED ORDER — OXYCODONE-ACETAMINOPHEN 5-325 MG PO TABS
1.0000 | ORAL_TABLET | Freq: Once | ORAL | Status: AC
  Administered 2018-03-01: 1 via ORAL
  Filled 2018-03-01: qty 1

## 2018-03-01 MED ORDER — IBUPROFEN 400 MG PO TABS
600.0000 mg | ORAL_TABLET | Freq: Once | ORAL | Status: AC
  Administered 2018-03-01: 600 mg via ORAL
  Filled 2018-03-01: qty 2
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
## 2018-04-04 ENCOUNTER — Emergency Department (HOSPITAL_COMMUNITY)
Admission: EM | Admit: 2018-04-04 | Discharge: 2018-04-04 | Disposition: A | Discharge status: Home / Self Care | Payer: Self-pay | Attending: Emergency Medicine | Admitting: Emergency Medicine

## 2018-04-04 DIAGNOSIS — Z79899 Other long term (current) drug therapy: Secondary | ICD-10-CM | POA: Insufficient documentation

## 2018-04-04 DIAGNOSIS — F1010 Alcohol abuse, uncomplicated: Secondary | ICD-10-CM | POA: Insufficient documentation

## 2018-04-04 MED ORDER — CHLORDIAZEPOXIDE HCL 25 MG PO CAPS: ORAL_CAPSULE | ORAL | 0 refills | Status: DC

## 2018-04-04 NOTE — ED Triage Notes (Signed)
04/04/18  1432  Per pt "I'm trying to go to rehab."

## 2018-04-04 NOTE — ED Provider Notes (Signed)
[facility] DEPT Provider Note   CSN: [id number] Arrival date & time: 04/04/18  1312     History   Chief Complaint Chief Complaint  Patient presents with  . Alcohol Problem    HPI Victoria Gordon is a 37 y.o. female with a PMHx of alcoholism, anemia, anxiety, hepB, depression, PTSD, polysubstance abuse, and other conditions listed below, who presents to the ED with complaints of "wanting to go to rehab".  She states that she has been binge drinking for the last 3 days, stopped around 11 AM.  She wants to go to rehab to stop drinking.  She states that occasionally when she drinks she has visual hallucinations seeing people that are not really there, but otherwise denies SI, HI, AH, illicit drug use, or tobacco use.  She has no medical complaints at this time.  She has never been admitted for withdrawal, no history of seizures or DTs.  No tx tried PTA, no aggravating factors aside from drinking alcohol.   The history is provided by the patient and medical records. No language interpreter was used.    Past Medical History:  Diagnosis Date  . Alcohol abuse   . Anemia   . Anxiety   . Hemorrhoid   . Hepatitis B   . Major depressive disorder   . PTSD (post-traumatic stress disorder)   . Substance abuse [facility])     Patient Active Problem List   Diagnosis Date Noted  . Contusion of abdominal wall   . DIC (disseminated intravascular coagulation) ([place]) 09/21/2017  . Sepsis due to group B Streptococcus ([place]) 09/21/2017  . Bacteremia due to group B Streptococcus 09/21/2017  . Hyperammonemia ([place]) 09/21/2017  . Alcoholic intoxication without complication ([place])   . Fall 09/20/2017  . SIRS (systemic inflammatory response syndrome) ([place]) 09/20/2017  . Intractable vomiting 09/20/2017  . Thrombocytopenia ([place]) 09/20/2017  . Alcoholic liver disease ([place]) 09/20/2017  . Low magnesium level   . Hepatic steatosis 09/05/2016  . Hiatal hernia 09/05/2016  .  Sepsis due to Escherichia coli (E. coli) ([place]) 09/03/2016  . Pyelonephritis 09/03/2016  . Elevated lactic acid level   . Alcohol use disorder, severe, dependence ([place]) 01/30/2016  . PTSD (post-traumatic stress disorder) 01/30/2016  . MDD (major depressive disorder), recurrent severe, without psychosis ([place]) 01/30/2016  . Hypokalemia 01/30/2016  . Rubella non-immune status, antepartum 02/18/2014  . UTI (urinary tract infection) in pregnancy in second trimester 02/17/2014    Past Surgical History:  Procedure Laterality Date  . CESAREAN SECTION    . CHOLECYSTECTOMY    . MOUTH SURGERY    . TUBAL LIGATION Bilateral 07/22/2014   Procedure: POST PARTUM TUBAL LIGATION;  Surgeon: Woodroe Mode, MD;  Location: [facility];  Service: Gynecology;  Laterality: Bilateral;     OB History    Gravida  7   Para  5   Term  5   Preterm  0   AB  2   Living  5     SAB  2   TAB  0   Ectopic  0   Multiple  0   Live Births  5            Home Medications    Prior to Admission medications   Medication Sig Start Date End Date Taking? Authorizing Provider  cefTRIAXone 2 g in dextrose 5 % 50 mL Inject 2 g into the vein daily. Start 09/26/17.  Last dose to be given on 10/03/2017. 09/26/17  Orson Eva, MD  feeding supplement, ENSURE ENLIVE, (ENSURE ENLIVE) LIQD Take 237 mLs by mouth 2 (two) times daily between meals. 09/25/17   Orson Eva, MD  furosemide (LASIX) 20 MG tablet Take 2 tablets (40 mg total) by mouth daily. 09/24/17   Orson Eva, MD  hydrocortisone (ANUSOL-HC) 2.5 % rectal cream Place rectally 2 (two) times daily at 10 AM and 5 PM. 09/24/17   Tat, Shanon Brow, MD  pantoprazole (PROTONIX) 40 MG tablet Take 1 tablet (40 mg total) by mouth 2 (two) times daily before a meal. 09/24/17   Tat, Shanon Brow, MD  potassium chloride (MICRO-K) 10 MEQ CR capsule Take 2 capsules by mouth daily. 04/28/17   [provider]  propranolol (INDERAL) 20 MG tablet Take 20 mg by mouth 2 (two) times daily.  05/29/17   [provider]  sertraline (ZOLOFT) 100 MG tablet Take 1 tablet (100 mg total) by mouth daily. For depression 02/05/16   Lindell Spar I, NP  spironolactone (ALDACTONE) 100 MG tablet Take 1 tablet (100 mg total) by mouth daily. 09/24/17   Orson Eva, MD  thiamine 100 MG tablet Take 100 mg by mouth daily. 04/29/17   [provider]  traMADol (ULTRAM) 50 MG tablet Take 1 tablet (50 mg total) by mouth every 6 (six) hours as needed. 03/01/18   Virgel Manifold, MD    Family History Family History  Problem Relation Age of Onset  . Diabetes Maternal Grandmother   . Diabetes Paternal Grandfather     Social History Social History   Tobacco Use  . Smoking status: Never Smoker  . Smokeless tobacco: Never Used  Substance Use Topics  . Alcohol use: Yes    Comment: last drink was this am, drinks occassionaly  . Drug use: No     Allergies   Vitamin k and related and Vancomycin   Review of Systems Review of Systems  Constitutional: Negative for chills and fever.  Respiratory: Negative for shortness of breath.   Cardiovascular: Negative for chest pain.  Gastrointestinal: Negative for abdominal pain, constipation, diarrhea, nausea and vomiting.  Genitourinary: Negative for dysuria and hematuria.  Musculoskeletal: Negative for arthralgias and myalgias.  Skin: Negative for color change.  Allergic/Immunologic: Negative for immunocompromised state.  Neurological: Negative for weakness and numbness.  Psychiatric/Behavioral: Positive for hallucinations. Negative for confusion and suicidal ideas.   All other systems reviewed and are negative for acute change except as noted in the HPI.    Physical Exam Updated Vital Signs BP 103/61 (BP Location: Left Arm)   Pulse 85   Temp 98.8 F (37.1 C) (Oral)   Resp 16   SpO2 97%   Physical Exam  Constitutional: She is oriented to person, place, and time. Vital signs are normal. She appears well-developed and well-nourished.   Non-toxic appearance. No distress.  Afebrile, nontoxic, NAD  HENT:  Head: Normocephalic and atraumatic.  Mouth/Throat: Oropharynx is clear and moist and mucous membranes are normal.  Eyes: Conjunctivae and EOM are normal. Right eye exhibits no discharge. Left eye exhibits no discharge.  Neck: Normal range of motion. Neck supple.  Cardiovascular: Normal rate, regular rhythm and intact distal pulses. Exam reveals no gallop and no friction rub.  Murmur heard. ?faint systolic murmur heard at LSB  Pulmonary/Chest: Effort normal and breath sounds normal. No respiratory distress. She has no decreased breath sounds. She has no wheezes. She has no rhonchi. She has no rales.  Abdominal: Soft. Normal appearance and bowel sounds are normal. She exhibits no distension.  There is no tenderness. There is no rigidity, no rebound, no guarding, no CVA tenderness, no tenderness at McBurney's point and negative Murphy's sign.  Musculoskeletal: Normal range of motion.  Neurological: She is alert and oriented to person, place, and time. She has normal strength. No sensory deficit.  Skin: Skin is warm, dry and intact. No rash noted.  Psychiatric: She has a normal mood and affect. She is not actively hallucinating. She expresses no homicidal and no suicidal ideation. She expresses no suicidal plans and no homicidal plans.  Normal mood and affect, pleasant and cooperative. Denies SI and HI, denies AH but reports occasional VH, although doesn't seem to be responding to internal stimuli.   Nursing note and vitals reviewed.    ED Treatments / Results  Labs (all labs ordered are listed, but only abnormal results are displayed) Labs Reviewed - No data to display  EKG None  Radiology No results found.  Procedures Procedures (including critical care time)  Medications Ordered in ED Medications - No data to display   Initial Impression / Assessment and Plan / ED Course  I have reviewed the triage vital signs  and the nursing notes.  Pertinent labs & imaging results that were available during my care of the patient were reviewed by me and considered in my medical decision making (see chart for details).     37 y.o. female here with alcoholism and wanting to go to rehab. On exam, VSS, no tremors or concerning findings, reports occasional visual hallucinations but doesn't appear to be responding to internal stimuli. Does not meet criteria for admission for alcohol withdrawal, offered pt resources and librium taper, strict use of this advised, f/up with resource guide places for detox/rehab. Alcohol cessation strongly encouraged. Doubt need for further emergent work up at this time. I explained the diagnosis and have given explicit precautions to return to the ER including for any other new or worsening symptoms. The patient understands and accepts the medical plan as it's been dictated and I have answered their questions. Discharge instructions concerning home care and prescriptions have been given. The patient is STABLE and is discharged to home in good condition.    Final Clinical Impressions(s) / ED Diagnoses   Final diagnoses:  Alcohol abuse    ED Discharge Orders    [address] [place], [place] 04/04/18 1501    Tegeler, Gwenyth Allegra, MD 04/04/18 [phone]

## 2018-04-04 NOTE — Discharge Instructions (Addendum)
Use librium as directed to help with withdrawal symptoms, but you CANNOT DRINK ALCOHOL WHILE TAKING THAT MEDICATION! STOP DRINKING ALCOHOL! Use the list of resources below to find help with alcohol detox, call them today and go to a facility as soon as possible for ongoing management of your withdrawal. Stay well hydrated. Return to the ER for emergent changes or worsening symptoms.

## 2020-03-05 ENCOUNTER — Other Ambulatory Visit: Payer: Self-pay

## 2020-03-05 ENCOUNTER — Emergency Department (HOSPITAL_COMMUNITY)
Admission: EM | Admit: 2020-03-05 | Discharge: 2020-03-05 | Disposition: A | Discharge status: Home / Self Care | Payer: Self-pay | Attending: Emergency Medicine | Admitting: Emergency Medicine

## 2020-03-05 ENCOUNTER — Emergency Department (HOSPITAL_COMMUNITY)
Admission: EM | Admit: 2020-03-05 | Discharge: 2020-03-05 | Discharge status: Against Medical Advice | Payer: Self-pay | Attending: Emergency Medicine | Admitting: Emergency Medicine

## 2020-03-05 ENCOUNTER — Encounter (HOSPITAL_COMMUNITY): Payer: Self-pay | Admitting: Emergency Medicine

## 2020-03-05 DIAGNOSIS — Z20822 Contact with and (suspected) exposure to covid-19: Secondary | ICD-10-CM | POA: Insufficient documentation

## 2020-03-05 DIAGNOSIS — Y939 Activity, unspecified: Secondary | ICD-10-CM | POA: Insufficient documentation

## 2020-03-05 DIAGNOSIS — F10129 Alcohol abuse with intoxication, unspecified: Secondary | ICD-10-CM | POA: Insufficient documentation

## 2020-03-05 DIAGNOSIS — S51811A Laceration without foreign body of right forearm, initial encounter: Secondary | ICD-10-CM | POA: Insufficient documentation

## 2020-03-05 DIAGNOSIS — Y908 Blood alcohol level of 240 mg/100 ml or more: Secondary | ICD-10-CM | POA: Insufficient documentation

## 2020-03-05 DIAGNOSIS — X58XXXA Exposure to other specified factors, initial encounter: Secondary | ICD-10-CM | POA: Insufficient documentation

## 2020-03-05 DIAGNOSIS — Z23 Encounter for immunization: Secondary | ICD-10-CM | POA: Insufficient documentation

## 2020-03-05 DIAGNOSIS — Y929 Unspecified place or not applicable: Secondary | ICD-10-CM | POA: Insufficient documentation

## 2020-03-05 DIAGNOSIS — Y999 Unspecified external cause status: Secondary | ICD-10-CM | POA: Insufficient documentation

## 2020-03-05 DIAGNOSIS — S41111A Laceration without foreign body of right upper arm, initial encounter: Secondary | ICD-10-CM

## 2020-03-05 DIAGNOSIS — F10929 Alcohol use, unspecified with intoxication, unspecified: Secondary | ICD-10-CM | POA: Insufficient documentation

## 2020-03-05 DIAGNOSIS — Z79899 Other long term (current) drug therapy: Secondary | ICD-10-CM | POA: Insufficient documentation

## 2020-03-05 LAB — CBC WITH DIFFERENTIAL/PLATELET
Abs Immature Granulocytes: 0.01 10*3/uL (ref 0.00–0.07)
Basophils Absolute: 0 10*3/uL (ref 0.0–0.1)
Basophils Relative: 1 %
Eosinophils Absolute: 0.1 10*3/uL (ref 0.0–0.5)
Eosinophils Relative: 1 %
HCT: 31.8 % — ABNORMAL LOW (ref 36.0–46.0)
Hemoglobin: 9.5 g/dL — ABNORMAL LOW (ref 12.0–15.0)
Immature Granulocytes: 0 %
Lymphocytes Relative: 36 %
Lymphs Abs: 2.1 10*3/uL (ref 0.7–4.0)
MCH: 21.9 pg — ABNORMAL LOW (ref 26.0–34.0)
MCHC: 29.9 g/dL — ABNORMAL LOW (ref 30.0–36.0)
MCV: 73.4 fL — ABNORMAL LOW (ref 80.0–100.0)
Monocytes Absolute: 0.5 10*3/uL (ref 0.1–1.0)
Monocytes Relative: 9 %
Neutro Abs: 3.1 10*3/uL (ref 1.7–7.7)
Neutrophils Relative %: 53 %
Platelets: 117 10*3/uL — ABNORMAL LOW (ref 150–400)
RBC: 4.33 MIL/uL (ref 3.87–5.11)
RDW: 18 % — ABNORMAL HIGH (ref 11.5–15.5)
WBC: 5.9 10*3/uL (ref 4.0–10.5)
nRBC: 0 % (ref 0.0–0.2)

## 2020-03-05 LAB — RESPIRATORY PANEL BY RT PCR (FLU A&B, COVID)
Influenza A by PCR: NEGATIVE
Influenza B by PCR: NEGATIVE
SARS Coronavirus 2 by RT PCR: NEGATIVE

## 2020-03-05 LAB — RAPID URINE DRUG SCREEN, HOSP PERFORMED
Amphetamines: NOT DETECTED
Barbiturates: NOT DETECTED
Benzodiazepines: NOT DETECTED
Cocaine: NOT DETECTED
Opiates: NOT DETECTED
Tetrahydrocannabinol: NOT DETECTED

## 2020-03-05 LAB — COMPREHENSIVE METABOLIC PANEL
ALT: 16 U/L (ref 0–44)
AST: 33 U/L (ref 15–41)
Albumin: 3.5 g/dL (ref 3.5–5.0)
Alkaline Phosphatase: 95 U/L (ref 38–126)
Anion gap: 13 (ref 5–15)
BUN: 7 mg/dL (ref 6–20)
CO2: 19 mmol/L — ABNORMAL LOW (ref 22–32)
Calcium: 7.8 mg/dL — ABNORMAL LOW (ref 8.9–10.3)
Chloride: 114 mmol/L — ABNORMAL HIGH (ref 98–111)
Creatinine, Ser: 0.53 mg/dL (ref 0.44–1.00)
GFR calc Af Amer: >60 mL/min (ref >60)
GFR calc non Af Amer: >60 mL/min (ref >60)
Glucose, Bld: 125 mg/dL — ABNORMAL HIGH (ref 70–99)
Potassium: 3.1 mmol/L — ABNORMAL LOW (ref 3.5–5.1)
Sodium: 146 mmol/L — ABNORMAL HIGH (ref 135–145)
Total Bilirubin: 0.8 mg/dL (ref 0.3–1.2)
Total Protein: 6.9 g/dL (ref 6.5–8.1)

## 2020-03-05 LAB — POC URINE PREG, ED: Preg Test, Ur: NEGATIVE

## 2020-03-05 LAB — ETHANOL: Alcohol, Ethyl (B): 252 mg/dL — ABNORMAL HIGH (ref <10)

## 2020-03-05 MED ORDER — LIDOCAINE HCL (PF) 2 % IJ SOLN
INTRAMUSCULAR | Status: AC
  Filled 2020-03-05: qty 10

## 2020-03-05 MED ORDER — ZIPRASIDONE MESYLATE 20 MG IM SOLR
20.0000 mg | Freq: Once | INTRAMUSCULAR | Status: AC
  Administered 2020-03-05: 08:00:00 20 mg via INTRAMUSCULAR

## 2020-03-05 MED ORDER — TETANUS-DIPHTH-ACELL PERTUSSIS 5-2.5-18.5 LF-MCG/0.5 IM SUSP
0.5000 mL | Freq: Once | INTRAMUSCULAR | Status: AC
  Administered 2020-03-05: 0.5 mL via INTRAMUSCULAR
  Filled 2020-03-05: qty 0.5
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
## 2020-03-05 NOTE — ED Notes (Signed)
Non-stick dressing applied to Left forearm with kling.  Area cleaned with NS.

## 2020-03-05 NOTE — ED Notes (Signed)
Sitter at bedside.
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
## 2020-03-05 NOTE — Discharge Instructions (Addendum)
You were evaluated in the Emergency Department and after careful evaluation, we did not find any emergent condition requiring admission or further testing in the hospital.  We advise that you avoid alcohol in the future.  You will need to have your stitches removed in 10 to 14 days by healthcare professional.  Please return to the Emergency Department if you experience any worsening of your condition.  We encourage you to follow up with a primary care provider.  Thank you for allowing [place] to be a part of your care.

## 2020-03-05 NOTE — ED Notes (Signed)
Sheriff signed off by Camera operator.  Pt calm and cooperative.  Dr Sedonia Small at bedside providing care to laceration.
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
## 2020-10-07 ENCOUNTER — Emergency Department (HOSPITAL_COMMUNITY)
Admission: EM | Admit: 2020-10-07 | Discharge: 2020-10-07 | Disposition: A | Discharge status: Home / Self Care | Payer: Self-pay | Attending: Emergency Medicine | Admitting: Emergency Medicine

## 2020-10-07 ENCOUNTER — Other Ambulatory Visit: Payer: Self-pay

## 2020-10-07 ENCOUNTER — Encounter (HOSPITAL_COMMUNITY): Payer: Self-pay | Admitting: Emergency Medicine

## 2020-10-07 ENCOUNTER — Emergency Department (HOSPITAL_COMMUNITY): Payer: Self-pay

## 2020-10-07 DIAGNOSIS — R079 Chest pain, unspecified: Secondary | ICD-10-CM | POA: Insufficient documentation

## 2020-10-07 DIAGNOSIS — F1099 Alcohol use, unspecified with unspecified alcohol-induced disorder: Secondary | ICD-10-CM | POA: Insufficient documentation

## 2020-10-07 DIAGNOSIS — Z5321 Procedure and treatment not carried out due to patient leaving prior to being seen by health care provider: Secondary | ICD-10-CM | POA: Insufficient documentation

## 2020-10-07 LAB — BASIC METABOLIC PANEL
Anion gap: 11 (ref 5–15)
BUN: 9 mg/dL (ref 6–20)
CO2: 23 mmol/L (ref 22–32)
Calcium: 7.8 mg/dL — ABNORMAL LOW (ref 8.9–10.3)
Chloride: 108 mmol/L (ref 98–111)
Creatinine, Ser: 0.45 mg/dL (ref 0.44–1.00)
GFR, Estimated: >60 mL/min (ref >60)
Glucose, Bld: 101 mg/dL — ABNORMAL HIGH (ref 70–99)
Potassium: 3 mmol/L — ABNORMAL LOW (ref 3.5–5.1)
Sodium: 142 mmol/L (ref 135–145)

## 2020-10-07 LAB — URINALYSIS, ROUTINE W REFLEX MICROSCOPIC
Bilirubin Urine: NEGATIVE
Glucose, UA: NEGATIVE mg/dL
Hgb urine dipstick: NEGATIVE
Ketones, ur: NEGATIVE mg/dL
Leukocytes,Ua: NEGATIVE
Nitrite: NEGATIVE
Protein, ur: NEGATIVE mg/dL
Specific Gravity, Urine: 1.005 (ref 1.005–1.030)
pH: 8 (ref 5.0–8.0)

## 2020-10-07 LAB — TROPONIN I (HIGH SENSITIVITY): Troponin I (High Sensitivity): 5 ng/L (ref <18)

## 2020-10-07 LAB — CBC
HCT: 32.7 % — ABNORMAL LOW (ref 36.0–46.0)
Hemoglobin: 8.9 g/dL — ABNORMAL LOW (ref 12.0–15.0)
MCH: 18.5 pg — ABNORMAL LOW (ref 26.0–34.0)
MCHC: 27.2 g/dL — ABNORMAL LOW (ref 30.0–36.0)
MCV: 68.1 fL — ABNORMAL LOW (ref 80.0–100.0)
Platelets: 112 10*3/uL — ABNORMAL LOW (ref 150–400)
RBC: 4.8 MIL/uL (ref 3.87–5.11)
RDW: 20.4 % — ABNORMAL HIGH (ref 11.5–15.5)
WBC: 6.2 10*3/uL (ref 4.0–10.5)
nRBC: 0 % (ref 0.0–0.2)

## 2020-10-07 LAB — POCT PREGNANCY, URINE: Preg Test, Ur: NEGATIVE

## 2020-10-07 NOTE — ED Notes (Signed)
Patient transported to X-ray 
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
## 2021-06-05 ENCOUNTER — Other Ambulatory Visit: Payer: Self-pay

## 2021-06-05 ENCOUNTER — Emergency Department (HOSPITAL_COMMUNITY): Payer: Medicaid Other

## 2021-06-05 ENCOUNTER — Inpatient Hospital Stay (HOSPITAL_COMMUNITY)
Admission: EM | Admit: 2021-06-05 | Discharge: 2021-06-11 | DRG: 368 | Disposition: A | Discharge status: Home / Self Care | Payer: Medicaid Other | Attending: Family Medicine | Admitting: Family Medicine

## 2021-06-05 ENCOUNTER — Encounter (HOSPITAL_COMMUNITY): Payer: Self-pay | Admitting: *Deleted

## 2021-06-05 DIAGNOSIS — K3189 Other diseases of stomach and duodenum: Secondary | ICD-10-CM | POA: Diagnosis present

## 2021-06-05 DIAGNOSIS — D696 Thrombocytopenia, unspecified: Secondary | ICD-10-CM | POA: Diagnosis not present

## 2021-06-05 DIAGNOSIS — Z881 Allergy status to other antibiotic agents status: Secondary | ICD-10-CM

## 2021-06-05 DIAGNOSIS — Z79899 Other long term (current) drug therapy: Secondary | ICD-10-CM

## 2021-06-05 DIAGNOSIS — K922 Gastrointestinal hemorrhage, unspecified: Secondary | ICD-10-CM

## 2021-06-05 DIAGNOSIS — E559 Vitamin D deficiency, unspecified: Secondary | ICD-10-CM | POA: Diagnosis present

## 2021-06-05 DIAGNOSIS — K7030 Alcoholic cirrhosis of liver without ascites: Secondary | ICD-10-CM

## 2021-06-05 DIAGNOSIS — K7031 Alcoholic cirrhosis of liver with ascites: Secondary | ICD-10-CM | POA: Diagnosis present

## 2021-06-05 DIAGNOSIS — F419 Anxiety disorder, unspecified: Secondary | ICD-10-CM | POA: Diagnosis present

## 2021-06-05 DIAGNOSIS — R161 Splenomegaly, not elsewhere classified: Secondary | ICD-10-CM | POA: Diagnosis present

## 2021-06-05 DIAGNOSIS — D6959 Other secondary thrombocytopenia: Secondary | ICD-10-CM | POA: Diagnosis present

## 2021-06-05 DIAGNOSIS — D649 Anemia, unspecified: Secondary | ICD-10-CM

## 2021-06-05 DIAGNOSIS — F1020 Alcohol dependence, uncomplicated: Secondary | ICD-10-CM | POA: Diagnosis present

## 2021-06-05 DIAGNOSIS — Z833 Family history of diabetes mellitus: Secondary | ICD-10-CM

## 2021-06-05 DIAGNOSIS — D62 Acute posthemorrhagic anemia: Secondary | ICD-10-CM | POA: Diagnosis present

## 2021-06-05 DIAGNOSIS — E669 Obesity, unspecified: Secondary | ICD-10-CM | POA: Diagnosis present

## 2021-06-05 DIAGNOSIS — K649 Unspecified hemorrhoids: Secondary | ICD-10-CM | POA: Diagnosis present

## 2021-06-05 DIAGNOSIS — D689 Coagulation defect, unspecified: Secondary | ICD-10-CM | POA: Diagnosis present

## 2021-06-05 DIAGNOSIS — Z9851 Tubal ligation status: Secondary | ICD-10-CM | POA: Diagnosis not present

## 2021-06-05 DIAGNOSIS — D638 Anemia in other chronic diseases classified elsewhere: Secondary | ICD-10-CM | POA: Diagnosis present

## 2021-06-05 DIAGNOSIS — K921 Melena: Secondary | ICD-10-CM | POA: Diagnosis present

## 2021-06-05 DIAGNOSIS — I8511 Secondary esophageal varices with bleeding: Secondary | ICD-10-CM | POA: Diagnosis present

## 2021-06-05 DIAGNOSIS — Z6836 Body mass index (BMI) 36.0-36.9, adult: Secondary | ICD-10-CM

## 2021-06-05 DIAGNOSIS — Z888 Allergy status to other drugs, medicaments and biological substances status: Secondary | ICD-10-CM

## 2021-06-05 DIAGNOSIS — K7011 Alcoholic hepatitis with ascites: Secondary | ICD-10-CM

## 2021-06-05 DIAGNOSIS — K625 Hemorrhage of anus and rectum: Secondary | ICD-10-CM | POA: Diagnosis not present

## 2021-06-05 DIAGNOSIS — F4310 Post-traumatic stress disorder, unspecified: Secondary | ICD-10-CM | POA: Diagnosis present

## 2021-06-05 DIAGNOSIS — F329 Major depressive disorder, single episode, unspecified: Secondary | ICD-10-CM | POA: Diagnosis present

## 2021-06-05 DIAGNOSIS — K648 Other hemorrhoids: Secondary | ICD-10-CM | POA: Diagnosis present

## 2021-06-05 DIAGNOSIS — M7989 Other specified soft tissue disorders: Secondary | ICD-10-CM | POA: Diagnosis present

## 2021-06-05 DIAGNOSIS — K644 Residual hemorrhoidal skin tags: Secondary | ICD-10-CM | POA: Diagnosis present

## 2021-06-05 DIAGNOSIS — Z9049 Acquired absence of other specified parts of digestive tract: Secondary | ICD-10-CM | POA: Diagnosis not present

## 2021-06-05 DIAGNOSIS — B1910 Unspecified viral hepatitis B without hepatic coma: Secondary | ICD-10-CM | POA: Diagnosis present

## 2021-06-05 DIAGNOSIS — K5900 Constipation, unspecified: Secondary | ICD-10-CM | POA: Diagnosis not present

## 2021-06-05 DIAGNOSIS — K766 Portal hypertension: Secondary | ICD-10-CM | POA: Diagnosis present

## 2021-06-05 DIAGNOSIS — I8510 Secondary esophageal varices without bleeding: Secondary | ICD-10-CM | POA: Diagnosis present

## 2021-06-05 DIAGNOSIS — U071 COVID-19: Secondary | ICD-10-CM | POA: Clinically undetermined

## 2021-06-05 DIAGNOSIS — R140 Abdominal distension (gaseous): Secondary | ICD-10-CM | POA: Diagnosis present

## 2021-06-05 DIAGNOSIS — R188 Other ascites: Secondary | ICD-10-CM

## 2021-06-05 LAB — CBC
HCT: 27.7 % — ABNORMAL LOW (ref 36.0–46.0)
Hemoglobin: 8.5 g/dL — ABNORMAL LOW (ref 12.0–15.0)
MCH: 24.6 pg — ABNORMAL LOW (ref 26.0–34.0)
MCHC: 30.7 g/dL (ref 30.0–36.0)
MCV: 80.3 fL (ref 80.0–100.0)
Platelets: 79 10*3/uL — ABNORMAL LOW (ref 150–400)
RBC: 3.45 MIL/uL — ABNORMAL LOW (ref 3.87–5.11)
RDW: 25.4 % — ABNORMAL HIGH (ref 11.5–15.5)
WBC: 6.2 10*3/uL (ref 4.0–10.5)
nRBC: 0 % (ref 0.0–0.2)

## 2021-06-05 LAB — COMPREHENSIVE METABOLIC PANEL
ALT: 26 U/L (ref 0–44)
AST: 176 U/L — ABNORMAL HIGH (ref 15–41)
Albumin: 2.1 g/dL — ABNORMAL LOW (ref 3.5–5.0)
Alkaline Phosphatase: 164 U/L — ABNORMAL HIGH (ref 38–126)
Anion gap: 6 (ref 5–15)
BUN: 5 mg/dL — ABNORMAL LOW (ref 6–20)
CO2: 24 mmol/L (ref 22–32)
Calcium: 7 mg/dL — ABNORMAL LOW (ref 8.9–10.3)
Chloride: 104 mmol/L (ref 98–111)
Creatinine, Ser: 0.34 mg/dL — ABNORMAL LOW (ref 0.44–1.00)
GFR, Estimated: >60 mL/min (ref >60)
Glucose, Bld: 94 mg/dL (ref 70–99)
Potassium: 3.5 mmol/L (ref 3.5–5.1)
Sodium: 134 mmol/L — ABNORMAL LOW (ref 135–145)
Total Bilirubin: 6.2 mg/dL — ABNORMAL HIGH (ref 0.3–1.2)
Total Protein: 5.9 g/dL — ABNORMAL LOW (ref 6.5–8.1)

## 2021-06-05 LAB — URINALYSIS, ROUTINE W REFLEX MICROSCOPIC
Bacteria, UA: NONE SEEN
Glucose, UA: 50 mg/dL — AB
Hgb urine dipstick: NEGATIVE
Ketones, ur: NEGATIVE mg/dL
Nitrite: NEGATIVE
Protein, ur: 30 mg/dL — AB
Specific Gravity, Urine: 1.026 (ref 1.005–1.030)
pH: 6 (ref 5.0–8.0)

## 2021-06-05 LAB — PROTIME-INR
INR: 2.3 — ABNORMAL HIGH (ref 0.8–1.2)
Prothrombin Time: 25 seconds — ABNORMAL HIGH (ref 11.4–15.2)

## 2021-06-05 LAB — HEMOGLOBIN AND HEMATOCRIT, BLOOD
HCT: 25.9 % — ABNORMAL LOW (ref 36.0–46.0)
Hemoglobin: 7.8 g/dL — ABNORMAL LOW (ref 12.0–15.0)

## 2021-06-05 LAB — PREGNANCY, URINE: Preg Test, Ur: NEGATIVE

## 2021-06-05 LAB — LIPASE, BLOOD: Lipase: 43 U/L (ref 11–51)

## 2021-06-05 LAB — POC OCCULT BLOOD, ED: Fecal Occult Bld: POSITIVE — AB

## 2021-06-05 MED ORDER — SODIUM CHLORIDE 0.9% IV SOLUTION: Freq: Once | INTRAVENOUS | Status: AC

## 2021-06-05 MED ORDER — POTASSIUM CHLORIDE CRYS ER 20 MEQ PO TBCR
40.0000 meq | EXTENDED_RELEASE_TABLET | Freq: Once | ORAL | Status: AC
  Administered 2021-06-06: 40 meq via ORAL
  Filled 2021-06-05: qty 2

## 2021-06-05 MED ORDER — FUROSEMIDE 10 MG/ML IJ SOLN
40.0000 mg | Freq: Once | INTRAMUSCULAR | Status: AC
  Administered 2021-06-06: 40 mg via INTRAVENOUS
  Filled 2021-06-05: qty 4

## 2021-06-05 MED ORDER — ONDANSETRON HCL 4 MG PO TABS: 4.0000 mg | ORAL_TABLET | Freq: Four times a day (QID) | ORAL | Status: DC | PRN

## 2021-06-05 MED ORDER — LORAZEPAM 1 MG PO TABS: 1.0000 mg | ORAL_TABLET | ORAL | Status: AC | PRN

## 2021-06-05 MED ORDER — FUROSEMIDE 10 MG/ML IJ SOLN
20.0000 mg | Freq: Once | INTRAMUSCULAR | Status: AC
  Administered 2021-06-05: 20 mg via INTRAVENOUS
  Filled 2021-06-05: qty 2

## 2021-06-05 MED ORDER — POLYETHYLENE GLYCOL 3350 17 G PO PACK
17.0000 g | PACK | Freq: Every day | ORAL | Status: DC | PRN
Start: 2021-06-05 — End: 2021-06-08

## 2021-06-05 MED ORDER — SODIUM CHLORIDE 0.9 % IV SOLN: 250.0000 mL | INTRAVENOUS | Status: DC | PRN

## 2021-06-05 MED ORDER — PROPRANOLOL HCL 20 MG PO TABS
20.0000 mg | ORAL_TABLET | Freq: Two times a day (BID) | ORAL | Status: DC
  Administered 2021-06-06 – 2021-06-11 (×10): 20 mg via ORAL
  Filled 2021-06-05 (×12): qty 1

## 2021-06-05 MED ORDER — SODIUM CHLORIDE 0.9 % IV SOLN
1.0000 g | INTRAVENOUS | Status: DC
  Administered 2021-06-05 – 2021-06-10 (×6): 1 g via INTRAVENOUS
  Filled 2021-06-05 (×6): qty 10

## 2021-06-05 MED ORDER — ADULT MULTIVITAMIN W/MINERALS CH
1.0000 | ORAL_TABLET | Freq: Every day | ORAL | Status: DC
  Administered 2021-06-06 – 2021-06-11 (×6): 1 via ORAL
  Filled 2021-06-05 (×7): qty 1

## 2021-06-05 MED ORDER — LORAZEPAM 2 MG/ML IJ SOLN: 1.0000 mg | INTRAMUSCULAR | Status: AC | PRN

## 2021-06-05 MED ORDER — SODIUM CHLORIDE 0.9% FLUSH
3.0000 mL | Freq: Two times a day (BID) | INTRAVENOUS | Status: DC
  Administered 2021-06-07 – 2021-06-10 (×5): 3 mL via INTRAVENOUS

## 2021-06-05 MED ORDER — ONDANSETRON HCL 4 MG/2ML IJ SOLN
4.0000 mg | Freq: Four times a day (QID) | INTRAMUSCULAR | Status: DC | PRN
  Filled 2021-06-05: qty 2

## 2021-06-05 MED ORDER — SODIUM CHLORIDE 0.9% FLUSH: 3.0000 mL | INTRAVENOUS | Status: DC | PRN

## 2021-06-05 MED ORDER — THIAMINE HCL 100 MG/ML IJ SOLN
100.0000 mg | Freq: Every day | INTRAMUSCULAR | Status: DC
  Administered 2021-06-08: 100 mg via INTRAVENOUS
  Filled 2021-06-05: qty 2

## 2021-06-05 MED ORDER — HYDROCORTISONE 2.5 % RE CREA
TOPICAL_CREAM | Freq: Two times a day (BID) | RECTAL | Status: DC
  Filled 2021-06-05 (×2): qty 28.35

## 2021-06-05 MED ORDER — SODIUM CHLORIDE 0.9% FLUSH
3.0000 mL | Freq: Two times a day (BID) | INTRAVENOUS | Status: DC
  Administered 2021-06-06 – 2021-06-11 (×11): 3 mL via INTRAVENOUS

## 2021-06-05 MED ORDER — TRAZODONE HCL 50 MG PO TABS: 50.0000 mg | ORAL_TABLET | Freq: Every evening | ORAL | Status: DC | PRN

## 2021-06-05 MED ORDER — THIAMINE HCL 100 MG PO TABS
100.0000 mg | ORAL_TABLET | Freq: Every day | ORAL | Status: DC
  Administered 2021-06-05 – 2021-06-11 (×6): 100 mg via ORAL
  Filled 2021-06-05 (×7): qty 1

## 2021-06-05 MED ORDER — POTASSIUM CHLORIDE CRYS ER 20 MEQ PO TBCR
40.0000 meq | EXTENDED_RELEASE_TABLET | Freq: Once | ORAL | Status: AC
  Administered 2021-06-05: 40 meq via ORAL
  Filled 2021-06-05: qty 2

## 2021-06-05 MED ORDER — BISACODYL 10 MG RE SUPP: 10.0000 mg | Freq: Every day | RECTAL | Status: DC | PRN

## 2021-06-05 MED ORDER — SODIUM CHLORIDE 0.9% FLUSH
3.0000 mL | Freq: Two times a day (BID) | INTRAVENOUS | Status: DC
  Administered 2021-06-05 – 2021-06-10 (×6): 3 mL via INTRAVENOUS

## 2021-06-05 MED ORDER — DIAZEPAM 2 MG PO TABS
2.0000 mg | ORAL_TABLET | Freq: Three times a day (TID) | ORAL | Status: AC
Start: 2021-06-05 — End: 2021-06-07
  Administered 2021-06-05 – 2021-06-07 (×6): 2 mg via ORAL
  Filled 2021-06-05 (×6): qty 1

## 2021-06-05 MED ORDER — SPIRONOLACTONE 25 MG PO TABS
25.0000 mg | ORAL_TABLET | Freq: Two times a day (BID) | ORAL | Status: AC
  Administered 2021-06-06 – 2021-06-08 (×6): 25 mg via ORAL
  Filled 2021-06-05 (×7): qty 1

## 2021-06-05 MED ORDER — IOHEXOL 350 MG/ML SOLN
85.0000 mL | Freq: Once | INTRAVENOUS | Status: AC | PRN
  Administered 2021-06-05: 85 mL via INTRAVENOUS

## 2021-06-05 MED ORDER — FOLIC ACID 1 MG PO TABS
1.0000 mg | ORAL_TABLET | Freq: Every day | ORAL | Status: DC
Start: 2021-06-05 — End: 2021-06-09
  Administered 2021-06-05 – 2021-06-07 (×3): 1 mg via ORAL
  Filled 2021-06-05 (×5): qty 1

## 2021-06-05 NOTE — ED Notes (Signed)
Pt here with reports of abdominal distension x 1 week and hx of heaving alcohol consumption and cirrhosis. Pt does have very distended abdomen. Pt also has bilateral pitting edema

## 2021-06-05 NOTE — H&P (Signed)
Patient Demographics:    Victoria Gordon, is a 40 y.o. 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   Thrombocytopenia (HCC)   1) acute GI bleed--- in the setting of thrombocytopenia and liver cirrhosis -Melena and bright red blood per rectum with heme positive stool- -Patient also has hemorrhoids -GI consult pending---  given liver cirrhosis with portal hypertension and ascites EGD may be warranted to rule out varices- -Hgb 8.5 , repeat HGb 7.8 --baseline usually around 9 INR is 2.3  -Patient apparently has hypersensitivity reaction to vitamin K -Treat empirically with FFP x2 units -Transfuse 1 unit of PRBC  2) alcoholic liver cirrhosis with ascites and portal hypertension--- please see CT abdomen report --Prophylactically give IV Rocephin given GI bleed -GI consult pending -Patient does have history of hep B, however AST is 156 with ALT of 26 this is more consistent with alcoholic liver disease than viral hepatitis -Total Bilirubin is 6.2 -Propranolol and Aldactone as ordered -Patient is not encephalopathic  3) coagulopathy/thrombocytopenia --- INR is 2.3 and platelets are down to 79 -Treat as above #1 given ongoing GI bleed concerns -Follow CBC and INR  4) possible UTI--- Rocephin as ordered pending culture  5) alcohol abuse--- lorazepam per CIWA protocol, folic acid and thiamine as ordered  6)Hemorroids---_Anusol Hc as ordered, GI input pending  Disposition/Need for in-Hospital Stay- patient unable to be  discharged at this time due to --acute GI bleed in the setting of thrombocytopenia and elevated INR requiring transfusion of FFP and PRBC and close monitoring*  Status is: Inpatient  Remains inpatient appropriate because: Please see disposition above  Dispo: The patient is from: Home              Anticipated d/c is to: Home              Anticipated d/c date is: 2 days              Patient currently is not medically stable to d/c. Barriers: Not Clinically Stable-    With History of - Reviewed by me  Past Medical History:  Diagnosis Date   Alcohol abuse    Anemia    Anxiety    Hemorrhoid    Hepatitis B    Major depressive disorder    PTSD (post-traumatic stress disorder)    Substance abuse ([place])       Past Surgical History:  Procedure Laterality Date   CESAREAN SECTION     CHOLECYSTECTOMY     MOUTH SURGERY     TUBAL LIGATION Bilateral 07/22/2014   Procedure: POST PARTUM TUBAL LIGATION;  Surgeon: Woodroe Mode,  MD;  Location: [facility];  Service: Gynecology;  Laterality: Bilateral;      Chief Complaint  Patient presents with   Abdominal Pain      HPI:    Victoria Gordon  is a 40 y.o. female female with longstanding history of alcohol abuse, who unfortunately recently relapsed and has been drinking again, as well as history of alcoholic liver cirrhosis with portal hypertension, ascites and chronic thrombocytopenia and elevated INR presents with concerns about bright red blood per rectum and melena, patient also has hemorrhoids -Patient endorses abdominal pain,.  Some nausea but no emesis No fever  Or chills  -No productive cough  -In the ED UA suspicious for UTI -Lipase is 43, bilirubin is 6.2 potassium is 3.5 -Hemoglobin is down to 8.5, repeat hemoglobin is 7.8--baseline usually above 9  AST to ALT ratio is more than 2:1  -No chest pains no palpitations no dizziness CT abdomen and pelvis shows-IMPRESSION: 1. Interval development of cirrhosis and portal venous  hypertension. 2. Liver is heterogeneous in appearance without discrete focal mass. 3. Splenomegaly. 4. 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  -     Review of systems:    In addition to the HPI above,   A full Review of  Systems was done, all other systems reviewed are negative except as noted above in HPI , .    Social History:  Reviewed by me    Social History   Tobacco Use   Smoking status: Never   Smokeless tobacco: Never  Substance Use Topics   Alcohol use: Yes    Alcohol/week: 10.0 standard drinks    Types: 10 Cans of beer per week    Comment: drinks heavily       Family History :  Reviewed by me    Family History  Problem Relation Age of Onset   Diabetes Maternal Grandmother    Diabetes Paternal Grandfather     Home Medications:   Prior to Admission medications   Medication Sig Start Date End Date Taking? Authorizing Provider  cefTRIAXone 2 g in dextrose 5 % 50 mL Inject 2 g into the vein daily. Start 09/26/17.  Last dose to be given on 10/03/2017. Patient not taking: No sig reported 09/26/17   Orson Eva, MD  chlordiazePOXIDE (LIBRIUM) 25 MG capsule '50mg'$  PO TID x 1D, then 25-'50mg'$  PO BID X 1D, then 25-'50mg'$  PO QD X 1D Patient not taking: No sig reported 04/04/18   Street, [place], PA-C  feeding supplement, ENSURE ENLIVE, (ENSURE ENLIVE) LIQD Take 237 mLs by mouth 2 (two) times daily between meals. Patient not taking: No sig reported 09/25/17   Tat, Shanon Brow, MD  furosemide (LASIX) 20 MG tablet Take 2 tablets (40 mg total) by mouth daily. Patient not taking: No sig reported 09/24/17   Tat, Shanon Brow, MD  hydrocortisone (ANUSOL-HC) 2.5 % rectal cream Place rectally 2 (two) times daily at 10 AM and 5 PM. Patient not taking: No sig reported 09/24/17   Tat, Shanon Brow, MD  pantoprazole (PROTONIX) 40 MG tablet Take 1 tablet (40 mg total) by mouth 2 (two) times daily before a meal. Patient not taking: No sig reported 09/24/17   Tat, Shanon Brow, MD  potassium  chloride (MICRO-K) 10 MEQ CR capsule Take 2 capsules by mouth daily. Patient not taking: Reported on 06/05/2021 04/28/17   [provider]  propranolol (INDERAL) 20 MG tablet Take 20 mg by mouth 2 (two) times daily. Patient not taking: Reported on 06/05/2021 05/29/17  [provider]  sertraline (ZOLOFT) 100 MG tablet Take 1 tablet (100 mg total) by mouth daily. For depression Patient not taking: Reported on 06/05/2021 02/05/16   Lindell Spar I, NP  spironolactone (ALDACTONE) 100 MG tablet Take 1 tablet (100 mg total) by mouth daily. Patient not taking: No sig reported 09/24/17   Tat, Shanon Brow, MD  thiamine 100 MG tablet Take 100 mg by mouth daily. Patient not taking: Reported on 06/05/2021 04/29/17   [provider]  traMADol (ULTRAM) 50 MG tablet Take 1 tablet (50 mg total) by mouth every 6 (six) hours as needed. Patient not taking: No sig reported 03/01/18   Virgel Manifold, MD     Allergies:     Allergies  Allergen Reactions   Vitamin K And Related Hives, Shortness Of Breath, Itching and Rash   Vancomycin Hives and Itching     Physical Exam:   Vitals  Blood pressure 126/84, pulse 84, temperature 98.4 F (36.9 C), temperature source Oral, resp. rate 18, height '5\' 1"'$  (1.549 m), weight 91.3 kg, last menstrual period 05/29/2021, SpO2 100 %.  Physical Examination: General appearance - alert, chronically ill appearing, and in no distress  Mental status - alert, oriented to person, place, and time,  Eyes - sclera anicteric Neck - supple, no JVD elevation , Chest - clear  to auscultation bilaterally, symmetrical air movement,  Heart - S1 and S2 normal, regular  Abdomen - generalized abdominal discomfort on palpation, abdomen is slightly distended but soft, bowel sounds are present Neurological - screening mental status exam normal, neck supple without rigidity, cranial nerves II through XII intact, DTR's normal and symmetric Extremities - no pedal edema noted, intact  peripheral pulses  Skin - warm, dry     Data Review:    CBC Recent Labs  Lab 06/05/21 1514 06/05/21 2119  WBC 6.2  --   HGB 8.5* 7.8*  HCT 27.7* 25.9*  PLT 79*  --   MCV 80.3  --   MCH 24.6*  --   MCHC 30.7  --   RDW 25.4*  --    ------------------------------------------------------------------------------------------------------------------  Chemistries  Recent Labs  Lab 06/05/21 1514  NA 134*  K 3.5  CL 104  CO2 24  GLUCOSE 94  BUN 5*  CREATININE 0.34*  CALCIUM 7.0*  AST 176*  ALT 26  ALKPHOS 164*  BILITOT 6.2*   ------------------------------------------------------------------------------------------------------------------ estimated creatinine clearance is 98.1 mL/min (A) (by C-G formula based on SCr of 0.34 mg/dL (L)). ------------------------------------------------------------------------------------------------------------------ No results for input(s): TSH, T4TOTAL, T3FREE, THYROIDAB in the last 72 hours.  Invalid input(s): FREET3   Coagulation profile Recent Labs  Lab 06/05/21 1514  INR 2.3*   ------------------------------------------------------------------------------------------------------------------- No results for input(s): DDIMER in the last 72 hours. -------------------------------------------------------------------------------------------------------------------  Cardiac Enzymes No results for input(s): CKMB, TROPONINI, MYOGLOBIN in the last 168 hours.  Invalid input(s): CK ------------------------------------------------------------------------------------------------------------------ No results found for: BNP   ---------------------------------------------------------------------------------------------------------------  Urinalysis    Component Value Date/Time   COLORURINE AMBER (A) 06/05/2021 1554   APPEARANCEUR CLOUDY (A) 06/05/2021 1554   LABSPEC 1.026 06/05/2021 1554   PHURINE 6.0 06/05/2021 1554   GLUCOSEU 50  (A) 06/05/2021 1554   HGBUR NEGATIVE 06/05/2021 1554   BILIRUBINUR MODERATE (A) 06/05/2021 1554   KETONESUR NEGATIVE 06/05/2021 1554   PROTEINUR 30 (A) 06/05/2021 1554   UROBILINOGEN 0.2 02/17/2014 1509   NITRITE NEGATIVE 06/05/2021 1554   LEUKOCYTESUR SMALL (A) 06/05/2021 1554    ----------------------------------------------------------------------------------------------------------------   Imaging Results:    CT ABDOMEN  PELVIS W CONTRAST  Result Date: 06/05/2021 CLINICAL DATA:  Abdominal pain, distention, and swelling of both LOWER extremities. Bright red bloody stools for 1 week. Prior cholecystectomy. EXAM: CT ABDOMEN AND PELVIS WITH CONTRAST TECHNIQUE: Multidetector CT imaging of the abdomen and pelvis was performed using the standard protocol following bolus administration of intravenous contrast. CONTRAST:  72m OMNIPAQUE IOHEXOL 350 MG/ML SOLN COMPARISON:  09/20/2017 FINDINGS: Lower chest: Lung bases are unremarkable.  Heart size is normal. Hepatobiliary: Significant ascites. Interval development of nodular, heterogeneous appearance of the liver without discrete focal lesion. Recanalized umbilical vein. Prominent caudate lobe. Prior cholecystectomy. Pancreas: Unremarkable. No pancreatic ductal dilatation or surrounding inflammatory changes. Spleen: Enlarged spleen without focal lesion. Adrenals/Urinary Tract: Normal adrenal glands. Kidneys are symmetric in size and enhancement. No hydronephrosis or suspicious renal mass. No evidence for ureteral obstruction. Decompressed urinary bladder is unremarkable. 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 Stomach is otherwise normal in appearance. Small bowel loops are normal in appearance. Colon is unremarkable. The appendix is well seen and has a normal appearance. 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 There is normal vascular opacification of the celiac axis, superior  mesenteric artery, and inferior mesenteric artery. Normal appearance of the portal venous system and inferior vena cava. No retroperitoneal or mesenteric adenopathy. Reproductive: Uterus is present. Probable tubal ligation clip in the RIGHT adnexal region. A fatty attenuation partially calcified mass is identified in the LEFT adnexal region is consistent with LEFT ovarian dermoid, 2.7 centimeters and previously 3.4 centimeters. Other: Significant ascites throughout the abdomen and pelvis. Body wall edema. Musculoskeletal: No acute or significant osseous findings. IMPRESSION: 1. Interval development of cirrhosis and portal venous hypertension. 2. Liver is heterogeneous in appearance without discrete focal mass. 3. Splenomegaly. 4. 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 5. No evidence for acute urinary tract or gastrointestinal tract abnormality. 6. Smaller LEFT ovarian dermoid. 7. Significant body wall edema. Electronically Signed   By: ENolon NationsM.D.   On: 06/05/2021 17:23    Radiological Exams on Admission: CT ABDOMEN PELVIS W CONTRAST  Result Date: 06/05/2021 CLINICAL DATA:  Abdominal pain, distention, and swelling of both LOWER extremities. Bright red bloody stools for 1 week. Prior cholecystectomy. EXAM: CT ABDOMEN AND PELVIS WITH CONTRAST TECHNIQUE: Multidetector CT imaging of the abdomen and pelvis was performed using the standard protocol following bolus administration of intravenous contrast. CONTRAST:  859mOMNIPAQUE IOHEXOL 350 MG/ML SOLN COMPARISON:  09/20/2017 FINDINGS: Lower chest: Lung bases are unremarkable.  Heart size is normal. Hepatobiliary: Significant ascites. Interval development of nodular, heterogeneous appearance of the liver without discrete focal lesion. Recanalized umbilical vein. Prominent caudate lobe. Prior cholecystectomy. Pancreas: Unremarkable. No pancreatic ductal dilatation or surrounding inflammatory changes. Spleen: Enlarged spleen without focal lesion.  Adrenals/Urinary Tract: Normal adrenal glands. Kidneys are symmetric in size and enhancement. No hydronephrosis or suspicious renal mass. No evidence for ureteral obstruction. Decompressed urinary bladder is unremarkable. 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 Stomach is otherwise normal in appearance. Small bowel loops are normal in appearance. Colon is unremarkable. The appendix is well seen and has a normal appearance. 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 There is normal vascular opacification of the celiac axis, superior mesenteric artery, and inferior mesenteric artery. Normal appearance of the portal venous system and inferior vena cava. No retroperitoneal or mesenteric adenopathy. Reproductive: Uterus is present. Probable tubal ligation clip in the RIGHT adnexal region. A fatty attenuation partially calcified mass is  identified in the LEFT adnexal region is consistent with LEFT ovarian dermoid, 2.7 centimeters and previously 3.4 centimeters. Other: Significant ascites throughout the abdomen and pelvis. Body wall edema. Musculoskeletal: No acute or significant osseous findings. IMPRESSION: 1. Interval development of cirrhosis and portal venous hypertension. 2. Liver is heterogeneous in appearance without discrete focal mass. 3. Splenomegaly. 4. 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 5. No evidence for acute urinary tract or gastrointestinal tract abnormality. 6. Smaller LEFT ovarian dermoid. 7. Significant body wall edema. Electronically Signed   By: Nolon Nations M.D.   On: 06/05/2021 17:23    DVT Prophylaxis -SCD /Gi Bleed AM Labs Ordered, also please review Full Orders  Family Communication: Admission, patients condition and plan of care including tests being ordered have been discussed with the patient  who indicate understanding and agree with the plan   Code Status - Full Code  Likely DC to  home after  resolution of GI bleed  Condition   stable  Roxan Hockey M.D on 06/05/2021 at 10:48 PM Go to [email or web address] -  for contact info  [facility] - Office  [phone]

## 2021-06-05 NOTE — ED Provider Notes (Signed)
[facility] EMERGENCY DEPARTMENT Provider Note   CSN: [id number] Arrival date & time: 06/05/21  1436     History Chief Complaint  Patient presents with   Abdominal Pain    Victoria Gordon is a 40 y.o. female.  Patient states that she has been having mild abdominal discomfort with distended abdomen swelling in her legs.  She has a history of alcohol cirrhosis and hepatitis B  The history is provided by the patient and medical records. No language interpreter was used.  Abdominal Pain Pain location:  Generalized Pain quality: aching   Pain radiates to:  Does not radiate Pain severity:  Moderate Onset quality:  Gradual Timing:  Constant Progression:  Waxing and waning Chronicity:  New Context: alcohol use   Relieved by:  Nothing Associated symptoms: no chest pain, no cough, no diarrhea, no fatigue and no hematuria       Past Medical History:  Diagnosis Date   Alcohol abuse    Anemia    Anxiety    Hemorrhoid    Hepatitis B    Major depressive disorder    PTSD (post-traumatic stress disorder)    Substance abuse ([place])     Patient Active Problem List   Diagnosis Date Noted   Acute GI bleeding 06/05/2021   Contusion of abdominal wall    DIC (disseminated intravascular coagulation) ([place]) 09/21/2017   Sepsis due to group B Streptococcus ([place]) 09/21/2017   Bacteremia due to group B Streptococcus 09/21/2017   Hyperammonemia ([place]) [id number]   Alcoholic intoxication without complication ([place])    Fall 09/20/2017   SIRS (systemic inflammatory response syndrome) ([place]) 09/20/2017   Intractable vomiting 09/20/2017   Thrombocytopenia ([place]) [id number]   Alcoholic liver disease ([place]) 09/20/2017   Low magnesium level    Hepatic steatosis 09/05/2016   Hiatal hernia 09/05/2016   Sepsis due to Escherichia coli (E. 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   Hypokalemia 01/30/2016   Rubella non-immune status, antepartum 02/18/2014   UTI (urinary tract infection) in pregnancy in second trimester 02/17/2014    Past Surgical History:  Procedure Laterality Date   CESAREAN SECTION     CHOLECYSTECTOMY     MOUTH SURGERY     TUBAL LIGATION Bilateral 07/22/2014   Procedure: POST PARTUM TUBAL LIGATION;  Surgeon: Woodroe Mode, MD;  Location: [facility];  Service: Gynecology;  Laterality: Bilateral;     OB History     Gravida  7   Para  5   Term  5   Preterm  0   AB  2   Living  5      SAB  2   IAB  0   Ectopic  0   Multiple  0   Live Births  5           Family History  Problem Relation Age of Onset   Diabetes Maternal Grandmother    Diabetes Paternal Grandfather     Social History   Tobacco Use   Smoking status: Never   Smokeless tobacco: Never  Vaping Use   Vaping Use: Never used  Substance Use Topics   Alcohol use: Yes    Alcohol/week: 10.0 standard drinks    Types: 10 Cans of beer per week    Comment: drinks heavily  Drug use: No    Home Medications Prior to Admission medications   Medication Sig Start Date End Date Taking? Authorizing Provider  cefTRIAXone 2 g in dextrose 5 % 50 mL Inject 2 g into the vein daily. Start 09/26/17.  Last dose to be given on 10/03/2017. Patient not taking: No sig reported 09/26/17   Orson Eva, MD  chlordiazePOXIDE (LIBRIUM) 25 MG capsule '50mg'$  PO TID x 1D, then 25-'50mg'$  PO BID X 1D, then 25-'50mg'$  PO QD X 1D Patient not taking: No sig reported 04/04/18   Street, Fowler, PA-C  feeding supplement, ENSURE ENLIVE, (ENSURE ENLIVE) LIQD Take 237 mLs by mouth 2 (two) times daily between meals. Patient not taking: No sig reported 09/25/17   Tat, Shanon Brow, MD  furosemide (LASIX) 20 MG tablet Take 2 tablets (40 mg total) by mouth daily. Patient not taking:  No sig reported 09/24/17   Tat, Shanon Brow, MD  hydrocortisone (ANUSOL-HC) 2.5 % rectal cream Place rectally 2 (two) times daily at 10 AM and 5 PM. Patient not taking: No sig reported 09/24/17   Tat, Shanon Brow, MD  pantoprazole (PROTONIX) 40 MG tablet Take 1 tablet (40 mg total) by mouth 2 (two) times daily before a meal. Patient not taking: No sig reported 09/24/17   Tat, Shanon Brow, MD  potassium chloride (MICRO-K) 10 MEQ CR capsule Take 2 capsules by mouth daily. Patient not taking: Reported on 06/05/2021 04/28/17   [provider]  propranolol (INDERAL) 20 MG tablet Take 20 mg by mouth 2 (two) times daily. Patient not taking: Reported on 06/05/2021 05/29/17   [provider]  sertraline (ZOLOFT) 100 MG tablet Take 1 tablet (100 mg total) by mouth daily. For depression Patient not taking: Reported on 06/05/2021 02/05/16   Lindell Spar I, NP  spironolactone (ALDACTONE) 100 MG tablet Take 1 tablet (100 mg total) by mouth daily. Patient not taking: No sig reported 09/24/17   Tat, Shanon Brow, MD  thiamine 100 MG tablet Take 100 mg by mouth daily. Patient not taking: Reported on 06/05/2021 04/29/17   [provider]  traMADol (ULTRAM) 50 MG tablet Take 1 tablet (50 mg total) by mouth every 6 (six) hours as needed. Patient not taking: No sig reported 03/01/18   Virgel Manifold, MD    Allergies    Vitamin k and related and Vancomycin  Review of Systems   Review of Systems  Constitutional:  Negative for appetite change and fatigue.  HENT:  Negative for congestion, ear discharge and sinus pressure.   Eyes:  Negative for discharge.  Respiratory:  Negative for cough.   Cardiovascular:  Negative for chest pain.  Gastrointestinal:  Positive for abdominal pain. Negative for diarrhea.  Genitourinary:  Negative for frequency and hematuria.  Musculoskeletal:  Negative for back pain.  Skin:  Negative for rash.  Neurological:  Negative for seizures and headaches.  Psychiatric/Behavioral:  Negative for  hallucinations.    Physical Exam Updated Vital Signs BP (!) 132/92   Pulse 84   Temp 98.4 F (36.9 C) (Oral)   Resp 16   Ht '5\' 1"'$  (1.549 m)   Wt 91.3 kg   LMP 05/29/2021   SpO2 98%   BMI 38.02 kg/m   Physical Exam Vitals and nursing note reviewed.  Constitutional:      Appearance: She is well-developed.  HENT:     Head: Normocephalic.     Nose: Nose normal.  Eyes:     General: No scleral icterus.    Conjunctiva/sclera: Conjunctivae normal.  Neck:  Thyroid: No thyromegaly.  Cardiovascular:     Rate and Rhythm: Normal rate and regular rhythm.     Heart sounds: No murmur heard.   No friction rub. No gallop.     Comments: Leg edema Pulmonary:     Breath sounds: No stridor. No wheezing or rales.  Chest:     Chest wall: No tenderness.  Abdominal:     General: There is no distension.     Tenderness: There is abdominal tenderness. There is no rebound.     Comments: Distended abdomen mild tenderness  Musculoskeletal:        General: Normal range of motion.     Cervical back: Neck supple.  Lymphadenopathy:     Cervical: No cervical adenopathy.  Skin:    Findings: No erythema or rash.  Neurological:     Mental Status: She is alert and oriented to person, place, and time.     Motor: No abnormal muscle tone.     Coordination: Coordination normal.  Psychiatric:        Behavior: Behavior normal.    ED Results / Procedures / Treatments   Labs (all labs ordered are listed, but only abnormal results are displayed) Labs Reviewed  COMPREHENSIVE METABOLIC PANEL - Abnormal; Notable for the following components:      Result Value   Sodium 134 (*)    BUN 5 (*)    Creatinine, Ser 0.34 (*)    Calcium 7.0 (*)    Total Protein 5.9 (*)    Albumin 2.1 (*)    AST 176 (*)    Alkaline Phosphatase 164 (*)    Total Bilirubin 6.2 (*)    All other components within normal limits  CBC - Abnormal; Notable for the following components:   RBC 3.45 (*)    Hemoglobin 8.5 (*)    HCT  27.7 (*)    MCH 24.6 (*)    RDW 25.4 (*)    Platelets 79 (*)    All other components within normal limits  URINALYSIS, ROUTINE W REFLEX MICROSCOPIC - Abnormal; Notable for the following components:   Color, Urine AMBER (*)    APPearance CLOUDY (*)    Glucose, UA 50 (*)    Bilirubin Urine MODERATE (*)    Protein, ur 30 (*)    Leukocytes,Ua SMALL (*)    All other components within normal limits  PROTIME-INR - Abnormal; Notable for the following components:   Prothrombin Time 25.0 (*)    INR 2.3 (*)    All other components within normal limits  POC OCCULT BLOOD, ED - Abnormal; Notable for the following components:   Fecal Occult Bld POSITIVE (*)    All other components within normal limits  LIPASE, BLOOD  PREGNANCY, URINE  HEMOGLOBIN AND HEMATOCRIT, BLOOD  HEMOGLOBIN AND HEMATOCRIT, BLOOD  COMPREHENSIVE METABOLIC PANEL  CBC  MAGNESIUM  PHOSPHORUS  TYPE AND SCREEN  TYPE AND SCREEN  PREPARE FRESH FROZEN PLASMA    EKG None  Radiology CT ABDOMEN PELVIS W CONTRAST  Result Date: 06/05/2021 CLINICAL DATA:  Abdominal pain, distention, and swelling of both LOWER extremities. Bright red bloody stools for 1 week. Prior cholecystectomy. EXAM: CT ABDOMEN AND PELVIS WITH CONTRAST TECHNIQUE: Multidetector CT imaging of the abdomen and pelvis was performed using the standard protocol following bolus administration of intravenous contrast. CONTRAST:  25m OMNIPAQUE IOHEXOL 350 MG/ML SOLN COMPARISON:  09/20/2017 FINDINGS: Lower chest: Lung bases are unremarkable.  Heart size is normal. Hepatobiliary: Significant ascites. Interval development of nodular,  heterogeneous appearance of the liver without discrete focal lesion. Recanalized umbilical vein. Prominent caudate lobe. Prior cholecystectomy. Pancreas: Unremarkable. No pancreatic ductal dilatation or surrounding inflammatory changes. Spleen: Enlarged spleen without focal lesion. Adrenals/Urinary Tract: Normal adrenal glands. Kidneys are symmetric  in size and enhancement. No hydronephrosis or suspicious renal mass. No evidence for ureteral obstruction. Decompressed urinary bladder is unremarkable. 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 Stomach is otherwise normal in appearance. Small bowel loops are normal in appearance. Colon is unremarkable. The appendix is well seen and has a normal appearance. 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 There is normal vascular opacification of the celiac axis, superior mesenteric artery, and inferior mesenteric artery. Normal appearance of the portal venous system and inferior vena cava. No retroperitoneal or mesenteric adenopathy. Reproductive: Uterus is present. Probable tubal ligation clip in the RIGHT adnexal region. A fatty attenuation partially calcified mass is identified in the LEFT adnexal region is consistent with LEFT ovarian dermoid, 2.7 centimeters and previously 3.4 centimeters. Other: Significant ascites throughout the abdomen and pelvis. Body wall edema. Musculoskeletal: No acute or significant osseous findings. IMPRESSION: 1. Interval development of cirrhosis and portal venous hypertension. 2. Liver is heterogeneous in appearance without discrete focal mass. 3. Splenomegaly. 4. 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 5. No evidence for acute urinary tract or gastrointestinal tract abnormality. 6. Smaller LEFT ovarian dermoid. 7. Significant body wall edema. Electronically Signed   By: Nolon Nations M.D.   On: 06/05/2021 17:23    Procedures Procedures   Medications Ordered in ED Medications  cefTRIAXone (ROCEPHIN) 1 g in sodium chloride 0.9 % 100 mL IVPB (has no administration in time range)  0.9 %  sodium chloride infusion (Manually program via Guardrails IV Fluids) (has no administration in time range)  furosemide (LASIX) injection 20 mg (has no administration in time range)  potassium chloride SA (KLOR-CON) CR  tablet 40 mEq (has no administration in time range)  LORazepam (ATIVAN) tablet 1-4 mg (has no administration in time range)    Or  LORazepam (ATIVAN) injection 1-4 mg (has no administration in time range)  thiamine tablet 100 mg (has no administration in time range)    Or  thiamine (B-1) injection 100 mg (has no administration in time range)  folic acid (FOLVITE) tablet 1 mg (has no administration in time range)  multivitamin with minerals tablet 1 tablet (has no administration in time range)  sodium chloride flush (NS) 0.9 % injection 3 mL (has no administration in time range)  sodium chloride flush (NS) 0.9 % injection 3 mL (has no administration in time range)  0.9 %  sodium chloride infusion (has no administration in time range)  diazepam (VALIUM) tablet 2 mg (has no administration in time range)  iohexol (OMNIPAQUE) 350 MG/ML injection 85 mL (85 mLs Intravenous Contrast Given 06/05/21 1650)    ED Course  I have reviewed the triage vital signs and the nursing notes.  Pertinent labs & imaging results that were available during my care of the patient were reviewed by me and considered in my medical decision making (see chart for details).    MDM Rules/Calculators/A&P                           Patient with alcoholic cirrhosis and history of hepatitis B.  She will be admitted to medicine.  I spoke with Dr. Wonda Amis gastroenterologist.  He stated that he will consult on  the patient tomorrow and the hospitalist can decide on giving her octreotide Final Clinical Impression(s) / ED Diagnoses Final diagnoses:  Alcoholic cirrhosis of liver without ascites [facility])    Rx / DC Orders ED Discharge Orders     None        Milton Ferguson, MD 06/05/21 2006

## 2021-06-05 NOTE — ED Triage Notes (Addendum)
Pt with abd distention and bilateral LE's for a month.  Bright red bloody stools, last BM 3 hours ago.

## 2021-06-06 DIAGNOSIS — D696 Thrombocytopenia, unspecified: Secondary | ICD-10-CM

## 2021-06-06 DIAGNOSIS — K7031 Alcoholic cirrhosis of liver with ascites: Secondary | ICD-10-CM

## 2021-06-06 DIAGNOSIS — F1020 Alcohol dependence, uncomplicated: Secondary | ICD-10-CM

## 2021-06-06 DIAGNOSIS — K7011 Alcoholic hepatitis with ascites: Secondary | ICD-10-CM

## 2021-06-06 DIAGNOSIS — K625 Hemorrhage of anus and rectum: Secondary | ICD-10-CM

## 2021-06-06 DIAGNOSIS — D62 Acute posthemorrhagic anemia: Secondary | ICD-10-CM

## 2021-06-06 LAB — COMPREHENSIVE METABOLIC PANEL
ALT: 29 U/L (ref 0–44)
AST: 158 U/L — ABNORMAL HIGH (ref 15–41)
Albumin: 2.3 g/dL — ABNORMAL LOW (ref 3.5–5.0)
Alkaline Phosphatase: 150 U/L — ABNORMAL HIGH (ref 38–126)
Anion gap: 6 (ref 5–15)
BUN: 5 mg/dL — ABNORMAL LOW (ref 6–20)
CO2: 27 mmol/L (ref 22–32)
Calcium: 7.3 mg/dL — ABNORMAL LOW (ref 8.9–10.3)
Chloride: 104 mmol/L (ref 98–111)
Creatinine, Ser: <0.3 mg/dL — ABNORMAL LOW (ref 0.44–1.00)
Glucose, Bld: 80 mg/dL (ref 70–99)
Potassium: 3.3 mmol/L — ABNORMAL LOW (ref 3.5–5.1)
Sodium: 137 mmol/L (ref 135–145)
Total Bilirubin: 6.8 mg/dL — ABNORMAL HIGH (ref 0.3–1.2)
Total Protein: 5.9 g/dL — ABNORMAL LOW (ref 6.5–8.1)

## 2021-06-06 LAB — PREPARE FRESH FROZEN PLASMA: Unit division: 0

## 2021-06-06 LAB — HEPATITIS C ANTIBODY: HCV Ab: NONREACTIVE

## 2021-06-06 LAB — PROTIME-INR
INR: 2 — ABNORMAL HIGH (ref 0.8–1.2)
Prothrombin Time: 22.9 seconds — ABNORMAL HIGH (ref 11.4–15.2)

## 2021-06-06 LAB — GLUCOSE, CAPILLARY
Glucose-Capillary: 76 mg/dL (ref 70–99)
Glucose-Capillary: 78 mg/dL (ref 70–99)
Glucose-Capillary: 94 mg/dL (ref 70–99)
Glucose-Capillary: 97 mg/dL (ref 70–99)

## 2021-06-06 LAB — BPAM FFP
Blood Product Expiration Date: 202208112359
Blood Product Expiration Date: 202208112359
ISSUE DATE / TIME: 202208062132
ISSUE DATE / TIME: 202208062358
Unit Type and Rh: 5100
Unit Type and Rh: 5100

## 2021-06-06 LAB — CBC
HCT: 27.8 % — ABNORMAL LOW (ref 36.0–46.0)
Hemoglobin: 8.6 g/dL — ABNORMAL LOW (ref 12.0–15.0)
MCH: 25.2 pg — ABNORMAL LOW (ref 26.0–34.0)
MCHC: 30.9 g/dL (ref 30.0–36.0)
MCV: 81.5 fL (ref 80.0–100.0)
Platelets: 65 10*3/uL — ABNORMAL LOW (ref 150–400)
RBC: 3.41 MIL/uL — ABNORMAL LOW (ref 3.87–5.11)
RDW: 24.2 % — ABNORMAL HIGH (ref 11.5–15.5)
WBC: 4.4 10*3/uL (ref 4.0–10.5)
nRBC: 0 % (ref 0.0–0.2)

## 2021-06-06 LAB — MAGNESIUM: Magnesium: 1.6 mg/dL — ABNORMAL LOW (ref 1.7–2.4)

## 2021-06-06 LAB — HEPATITIS B SURFACE ANTIGEN: Hepatitis B Surface Ag: NONREACTIVE

## 2021-06-06 LAB — PHOSPHORUS: Phosphorus: 3.6 mg/dL (ref 2.5–4.6)

## 2021-06-06 LAB — PREPARE RBC (CROSSMATCH)

## 2021-06-06 LAB — HIV ANTIBODY (ROUTINE TESTING W REFLEX): HIV Screen 4th Generation wRfx: NONREACTIVE

## 2021-06-06 MED ORDER — POTASSIUM CHLORIDE CRYS ER 20 MEQ PO TBCR
40.0000 meq | EXTENDED_RELEASE_TABLET | Freq: Once | ORAL | Status: AC
  Administered 2021-06-06: 40 meq via ORAL
  Filled 2021-06-06: qty 2

## 2021-06-06 MED ORDER — HYDROMORPHONE HCL 1 MG/ML IJ SOLN
1.0000 mg | INTRAMUSCULAR | Status: AC | PRN
  Administered 2021-06-06 – 2021-06-07 (×3): 1 mg via INTRAVENOUS
  Filled 2021-06-06 (×3): qty 1

## 2021-06-06 MED ORDER — ALBUMIN HUMAN 25 % IV SOLN
25.0000 g | Freq: Three times a day (TID) | INTRAVENOUS | Status: AC
  Administered 2021-06-06 – 2021-06-07 (×3): 25 g via INTRAVENOUS
  Filled 2021-06-06 (×3): qty 100

## 2021-06-06 MED ORDER — MAGNESIUM SULFATE 2 GM/50ML IV SOLN
2.0000 g | Freq: Once | INTRAVENOUS | Status: AC
  Administered 2021-06-06: 2 g via INTRAVENOUS
  Filled 2021-06-06: qty 50

## 2021-06-06 MED ORDER — PANTOPRAZOLE SODIUM 40 MG IV SOLR
40.0000 mg | Freq: Two times a day (BID) | INTRAVENOUS | Status: DC
  Administered 2021-06-06 – 2021-06-09 (×7): 40 mg via INTRAVENOUS
  Filled 2021-06-06 (×7): qty 40

## 2021-06-06 NOTE — Progress Notes (Signed)
[facility] night shift telemetry coverage note.  The nursing staff reported that the patient is having 7/10 abdominal pain and would like something to relieve it.  Hydromorphone 1 mg IVP every 4 hours x 3 doses ordered.  Tennis Must, MD

## 2021-06-06 NOTE — Consult Note (Addendum)
Referring Provider: Barton Dubois, MD Primary Care Physician:  Patient, [name] Primary Gastroenterologist:  Dr. Laural Golden  Reason for Consultation:    Decompensated alcoholic liver disease and rectal bleeding.  HPI:   Patient is 40 year old Hispanic female with history of alcoholic liver disease/cirrhosis who had extensive work-up at [place] a few in June 2018 summarized under past medical history who presented to emergency room yesterday with progressive abdominal distention lower extremity edema and rectal bleeding. Patient had prolonged hospitalization at [facility] for 28 days in June 2018.  She was treated for SBP and she also had transjugular liver biopsy as there was concern for Wilson's disease with biopsy showed steatohepatitis and stage II disease.  Based on imaging studies physicians felt that she had stage IV disease.  She received blood transfusion.  EGD did not reveal esophageal varices and revealed portal gastropathy.  She had had prior hemorrhoidectomy in March 2018.  During June hospitalization she underwent examination under anesthesia with sclerotherapy and ligation of some of the hemorrhoids. Patient was hospitalized at this facility in November 2018.  She was seen by Dr. Barney Drain but she never returned for follow-up visit.  Patient says she has noted gradual increase in abdominal ascites and lower extremity edema over the last 3 to 4 weeks.  For the last 1 week she has been passing bright red blood per rectum every time she has a bowel movement.  She generally has 3-4 stools per day.  She has not experienced melena or hematemesis.  She complains of generalized abdominal pain.  She felt warm and thought she had fever 3 days ago and took 600 mg of ibuprofen.  She does not take any medications on schedule and she does not take Tylenol. She noted her eyes to be yellow about 1 week ago. Patient says she generally has been drinking 24 ounces of beer 3 times a week.  But she  did not drink any alcohol for 2 weeks until she had multiple drinks of liquor when she was with her friend day before yesterday. She has had poor appetite.  She says she has gained 21 pounds since abdominal distention started.  She denies vaginal bleeding or hematuria.  Evaluation in emergency room revealed H&H of 8.5 and 27.7 with MCV of 80.3 and platelet count of 79K.  Comprehensive chemistry panel pertinent for creatinine of 0.34, bilirubin of 6.2 with AP of 164 AST of 176 and ALT of 26 and albumin of 2.1.  Lipase was 43.  INR was 2.3.  Pregnancy test was negative.  Abdominal pelvic CT revealed liver contour typical of cirrhosis ascites splenomegaly varices in left upper quadrant as well as esophageal varices.  Abdominal wall edema and small left ovarian dermoid. Patient was admitted to hospitalist service.  She was begun on ceftriaxone 1 g every 2 hours, CIWA protocol and she is also on diazepam 2 mg 3 times a day along with furosemide spironolactone.  Patient continues to complain of rectal bleeding.  She passed fresh blood with her bowel movement this morning.  Her abdominal pain is about the same.  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  She worked at a [employer] in [place] for 11 years.  [place] she was 40 years old.  She graduated from [employer] in 2003.  She has never married.  She has 5 children ages 73 through 30.  She has 1 son and 4 daughters.  Her sister has custody of these children. Patient lives with her mother who has never drank alcohol. She has 3 sisters in good health and she has 3 brothers 2 of whom have liver disease possibly due to alcohol.  Third brother is in good health.   Past Medical History:  Diagnosis Date   Alcoholic cirrhosis hospitalized at [facility] for decompensated disease between 04/01/2017 and 6/292018. She was diagnosed and treated for SBP during that admission.  She had low serum  ceruloplasmin and elevated urinary copper.  Slit-lamp exam was negative.  Biopsy did not show any changes of Wilson's disease autoimmune hepatitis or PBC.  She had transjugular biopsy on 04/27/2017 revealing steatohepatitis stage II disease. Work-up revealed immunity to hepatitis A and she has received hepatitis B vaccine at [facility]    Anemia    Anxiety    Hemorrhoid    Hepatitis B    Major depressive disorder    PTSD (post-traumatic stress disorder)    Substance abuse ([place])      Evaluated at [facility] on 12/27/2020 when she took     approximately 40 Advil tablets when she was also intoxicated.     History of vitamin D deficiency   Past Surgical History:  Procedure Laterality Date   CESAREAN SECTION     CHOLECYSTECTOMY     MOUTH SURGERY     TUBAL LIGATION Bilateral 07/22/2014   Procedure: POST PARTUM TUBAL LIGATION;  Surgeon: Woodroe Mode, MD;  Location: [facility];  Service: Gynecology;  Laterality: Bilateral;  Hemorrhoidectomy on 12/29/2016 at [facility]     Examination under anesthesia on 04/21/2017 with sclerotherapy and suture transfixion of internal hemorrhoids on 04/21/2017 at [facility] EGD on May 12, 2019 2018 revealed portal gastropathy but no varices   Prior to Admission medications   Medication Sig Start Date End Date Taking? Authorizing Provider  cefTRIAXone 2 g in dextrose 5 % 50 mL Inject 2 g into the vein daily. Start 09/26/17.  Last dose to be given on 10/03/2017. Patient not taking: No sig reported 09/26/17   Orson Eva, MD  chlordiazePOXIDE (LIBRIUM) 25 MG capsule '50mg'$  PO TID x 1D, then 25-'50mg'$  PO BID X 1D, then 25-'50mg'$  PO QD X 1D Patient not taking: No sig reported 04/04/18   Street, Gorst, PA-C  feeding supplement, ENSURE ENLIVE, (ENSURE ENLIVE) LIQD Take 237 mLs by mouth 2 (two) times daily between meals. Patient not taking: No sig reported 09/25/17   Tat, Shanon Brow, MD  furosemide (LASIX) 20 MG tablet Take 2 tablets (40 mg total) by mouth daily. Patient not taking: No sig reported  09/24/17   Tat, Shanon Brow, MD  hydrocortisone (ANUSOL-HC) 2.5 % rectal cream Place rectally 2 (two) times daily at 10 AM and 5 PM. Patient not taking: No sig reported 09/24/17   Tat, Shanon Brow, MD  pantoprazole (PROTONIX) 40 MG tablet Take 1 tablet (40 mg total) by mouth 2 (two) times daily before a meal. Patient not taking: No sig reported 09/24/17   Tat, Shanon Brow, MD  potassium chloride (MICRO-K) 10 MEQ CR capsule Take 2 capsules by mouth daily. Patient not taking: Reported on 06/05/2021 04/28/17   [provider]  propranolol (INDERAL) 20 MG tablet Take 20 mg by mouth 2 (two) times daily. Patient not taking: Reported on 06/05/2021 05/29/17   [provider]  sertraline (ZOLOFT) 100 MG tablet Take 1 tablet (100 mg total) by mouth daily. For depression Patient not taking: Reported on 06/05/2021 02/05/16   Lindell Spar I, NP  spironolactone (  ALDACTONE) 100 MG tablet Take 1 tablet (100 mg total) by mouth daily. Patient not taking: No sig reported 09/24/17   Tat, Shanon Brow, MD  thiamine 100 MG tablet Take 100 mg by mouth daily. Patient not taking: Reported on 06/05/2021 04/29/17   [provider]  traMADol (ULTRAM) 50 MG tablet Take 1 tablet (50 mg total) by mouth every 6 (six) hours as needed. Patient not taking: No sig reported 03/01/18   Virgel Manifold, MD    Current Facility-Administered Medications  Medication Dose Route Frequency Provider Last Rate Last Admin   0.9 %  sodium chloride infusion  250 mL Intravenous PRN Emokpae, Courage, MD       0.9 %  sodium chloride infusion  250 mL Intravenous PRN Denton Brick, Courage, MD       bisacodyl (DULCOLAX) suppository 10 mg  10 mg Rectal Daily PRN Emokpae, Courage, MD       cefTRIAXone (ROCEPHIN) 1 g in sodium chloride 0.9 % 100 mL IVPB  1 g Intravenous Q24H Roxan Hockey, MD   Stopped at 06/05/21 2038   diazepam (VALIUM) tablet 2 mg  2 mg Oral TID Roxan Hockey, MD   2 mg at [id number]   folic acid (FOLVITE) tablet 1 mg  1 mg Oral Daily  Emokpae, Courage, MD   1 mg at 06/06/21 [id number]   hydrocortisone (ANUSOL-HC) 2.5 % rectal cream   Rectal BID Roxan Hockey, MD   Given at 06/06/21 1013   LORazepam (ATIVAN) tablet 1-4 mg  1-4 mg Oral Q1H PRN Roxan Hockey, MD       Or   LORazepam (ATIVAN) injection 1-4 mg  1-4 mg Intravenous Q1H PRN Roxan Hockey, MD       multivitamin with minerals tablet 1 tablet  1 tablet Oral Daily Emokpae, Courage, MD   1 tablet at 06/06/21 0851   ondansetron (ZOFRAN) tablet 4 mg  4 mg Oral Q6H PRN Emokpae, Courage, MD       Or   ondansetron (ZOFRAN) injection 4 mg  4 mg Intravenous Q6H PRN Emokpae, Courage, MD       pantoprazole (PROTONIX) injection 40 mg  40 mg Intravenous Q12H Barton Dubois, MD   40 mg at 06/06/21 1013   polyethylene glycol (MIRALAX / GLYCOLAX) packet 17 g  17 g Oral Daily PRN Emokpae, Courage, MD       propranolol (INDERAL) tablet 20 mg  20 mg Oral BID Emokpae, Courage, MD   20 mg at 06/06/21 0851   sodium chloride flush (NS) 0.9 % injection 3 mL  3 mL Intravenous Q12H Emokpae, Courage, MD   3 mL at 06/05/21 2212   sodium chloride flush (NS) 0.9 % injection 3 mL  3 mL Intravenous PRN Emokpae, Courage, MD       sodium chloride flush (NS) 0.9 % injection 3 mL  3 mL Intravenous Q12H Emokpae, Courage, MD       sodium chloride flush (NS) 0.9 % injection 3 mL  3 mL Intravenous Q12H Emokpae, Courage, MD   3 mL at 06/06/21 1156   sodium chloride flush (NS) 0.9 % injection 3 mL  3 mL Intravenous PRN Emokpae, Courage, MD       spironolactone (ALDACTONE) tablet 25 mg  25 mg Oral BID Emokpae, Courage, MD   25 mg at 06/06/21 0851   thiamine tablet 100 mg  100 mg Oral Daily Emokpae, Courage, MD   100 mg at 06/06/21 [id number]   Or   thiamine (B-1) injection 100  mg  100 mg Intravenous Daily Emokpae, Courage, MD       traZODone (DESYREL) tablet 50 mg  50 mg Oral QHS PRN Roxan Hockey, MD        Allergies as of 06/05/2021 - Review Complete 06/05/2021  Allergen Reaction Noted   Vitamin k and  related Hives, Shortness Of Breath, Itching, and Rash 09/24/2017   Vancomycin Hives and Itching 09/20/2017    Family History  Problem Relation Age of Onset   Diabetes Maternal Grandmother    Diabetes Paternal Grandfather     Social History   Socioeconomic History   Marital status: Single    Spouse name: Not on file   Number of children: Not on file   Years of education: Not on file   Highest education level: Not on file  Occupational History   Not on file  Tobacco Use   Smoking status: Never   Smokeless tobacco: Never  Vaping Use   Vaping Use: Never used  Substance and Sexual Activity   Alcohol use: Yes    Alcohol/week: 10.0 standard drinks    Types: 10 Cans of beer per week    Comment: drinks heavily   Drug use: No   Sexual activity: Yes    Birth control/protection: None  Other Topics Concern   Not on file  Social History Narrative   Not on file   Social Determinants of Health   Financial Resource Strain: Not on file  Food Insecurity: Not on file  Transportation Needs: Not on file  Physical Activity: Not on file  Stress: Not on file  Social Connections: Not on file  Intimate Partner Violence: Not on file    Review of Systems: See HPI, otherwise normal ROS  Physical Exam: Temp:  [98.1 F (36.7 C)-98.9 F (37.2 C)] 98.1 F (36.7 C) (08/07 0454) Pulse Rate:  [70-93] 71 (08/07 0454) Resp:  [16-18] 18 (08/07 0454) BP: (112-148)/(74-116) 117/74 (08/07 0454) SpO2:  [98 %-100 %] 100 % (08/07 0454) Weight:  [91.3 kg] 91.3 kg (08/06 1501) Last BM Date: 06/05/21 Patient is alert and in no acute distress. She does not have asterixis or tremors. Conjunctivae is pale and sclera is icteric. Oropharyngeal mucosa is normal. Neck without masses thyromegaly or lymphadenopathy. Cardiac exam with regular rhythm normal S1 and S2.  She has grade 2/6 systolic murmur best heard at aortic area. Auscultation lungs reveal vesicular breath sounds bilaterally. Abdomen is  distended but not tense.  Bowel sounds are normal.  On palpation she has mild generalized tenderness.  Shifting dullness is present.  Liver edge is 3 to 4 cm below RCM and mildly tender.  Spleen tip is not palpable. She has 1+ pitting edema involving distal half of both legs.    Lab Results: Recent Labs    06/05/21 1514 06/05/21 2119 06/06/21 0602  WBC 6.2  --  4.4  HGB 8.5* 7.8* 8.6*  HCT 27.7* 25.9* 27.8*  PLT 79*  --  65*   BMET Recent Labs    06/05/21 1514 06/06/21 0602  NA 134* 137  K 3.5 3.3*  CL 104 104  CO2 24 27  GLUCOSE 94 80  BUN 5* 5*  CREATININE 0.34* <0.30*  CALCIUM 7.0* 7.3*   LFT Recent Labs    06/06/21 0602  PROT 5.9*  ALBUMIN 2.3*  AST 158*  ALT 29  ALKPHOS 150*  BILITOT 6.8*   PT/INR Recent Labs    06/05/21 1514 06/06/21 0602  LABPROT 25.0* 22.9*  INR 2.3*  2.0*   Hepatitis Panel No results for input(s): HEPBSAG, HCVAB, HEPAIGM, HEPBIGM in the last 72 hours.  Studies/Results: CT ABDOMEN PELVIS W CONTRAST  Result Date: 06/05/2021 CLINICAL DATA:  Abdominal pain, distention, and swelling of both LOWER extremities. Bright red bloody stools for 1 week. Prior cholecystectomy. EXAM: CT ABDOMEN AND PELVIS WITH CONTRAST TECHNIQUE: Multidetector CT imaging of the abdomen and pelvis was performed using the standard protocol following bolus administration of intravenous contrast. CONTRAST:  63m OMNIPAQUE IOHEXOL 350 MG/ML SOLN COMPARISON:  09/20/2017 FINDINGS: Lower chest: Lung bases are unremarkable.  Heart size is normal. Hepatobiliary: Significant ascites. Interval development of nodular, heterogeneous appearance of the liver without discrete focal lesion. Recanalized umbilical vein. Prominent caudate lobe. Prior cholecystectomy. Pancreas: Unremarkable. No pancreatic ductal dilatation or surrounding inflammatory changes. Spleen: Enlarged spleen without focal lesion. Adrenals/Urinary Tract: Normal adrenal glands. Kidneys are symmetric in size and  enhancement. No hydronephrosis or suspicious renal mass. No evidence for ureteral obstruction. Decompressed urinary bladder is unremarkable. 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 Stomach is otherwise normal in appearance. Small bowel loops are normal in appearance. Colon is unremarkable. The appendix is well seen and has a normal appearance. 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 There is normal vascular opacification of the celiac axis, superior mesenteric artery, and inferior mesenteric artery. Normal appearance of the portal venous system and inferior vena cava. No retroperitoneal or mesenteric adenopathy. Reproductive: Uterus is present. Probable tubal ligation clip in the RIGHT adnexal region. A fatty attenuation partially calcified mass is identified in the LEFT adnexal region is consistent with LEFT ovarian dermoid, 2.7 centimeters and previously 3.4 centimeters. Other: Significant ascites throughout the abdomen and pelvis. Body wall edema. Musculoskeletal: No acute or significant osseous findings. IMPRESSION: 1. Interval development of cirrhosis and portal venous hypertension. 2. Liver is heterogeneous in appearance without discrete focal mass. 3. Splenomegaly. 4. 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 5. No evidence for acute urinary tract or gastrointestinal tract abnormality. 6. Smaller LEFT ovarian dermoid. 7. Significant body wall edema. Electronically Signed   By: ENolon NationsM.D.   On: 06/05/2021 17:23    I have reviewed the study.  Assessment;  Patient is 40year old female who has biopsy-proven alcoholic liver disease [facility] who presents with ascites and lower extremity edema.  She is also jaundice and AST is elevated with normal ALT.  She also has anemia and thrombocytopenia and coagulopathy. She has a history of SBP.  Therefore this  diagnosis needs to be ruled out and I agree with treating her with ceftriaxone until she can undergo abdominal paracentesis for diagnostic purposes.  Her ascites not tense. Meld score is 23 and HD F is 56.3  Rectal bleeding.  Rectal bleeding appears to be secondary to hemorrhoids for which she has undergone hemorrhoidectomy in March 2018 and sclerotherapy in suturing in April 19, 2017.  Hemorrhoidal bleeding is secondary to portal hypertension.  She may need another exam under anesthesia if bleeding persists.  Anemia.  Anemia is multifactorial i.e. chronic disease and chronic hematochezia secondary to hemorrhoids.  Recommendations;  Continue ceftriaxone at current dose. Ultrasound-guided abdominal paracenteses primarily for diagnostic purposes. Discontinue diazepam and use lorazepam instead since diazepam is metabolized by the liver and his clearance may be impaired. Will rescreen for hepatitis B surface antigen, hepatitis B surface antibody and HCV antibody. If hepatic function continues to deteriorate she may be a candidate for prednisolone  unless she has SBP or viral infection. Would consider esophagogastroduodenoscopy and colonoscopy when she is deemed to be stable medically. INR and  chemistry panel daily for 3 days. Serum ammonia with a.m. lab.   LOS: 1 day   Latrelle Fuston  06/06/2021, 12:57 PM

## 2021-06-06 NOTE — Progress Notes (Signed)
PROGRESS NOTE    Victoria Gordon  [id number] DOB: [birth year] DOA: 06/05/2021 PCP: Patient, [name]    Chief Complaint  Patient presents with   Abdominal Pain    Brief admission narrative:  Victoria Gordon  is a 40 y.o. female female with longstanding history of alcohol abuse, who unfortunately recently relapsed and has been drinking again, as well as history of alcoholic liver cirrhosis with portal hypertension, ascites and chronic thrombocytopenia and elevated INR presents with concerns about bright red blood per rectum and melena, patient also has hemorrhoids -Patient endorses abdominal pain,.  Some nausea but no emesis No fever  Or chills -No productive cough  -In the ED UA suspicious for UTI -Lipase is 43, bilirubin is 6.2 potassium is 3.5 -Hemoglobin is down to 8.5, repeat hemoglobin is 7.8--baseline usually above 9  AST to ALT ratio is more than 2:1 -No chest pains no palpitations no dizziness CT abdomen and pelvis shows-IMPRESSION: 1. Interval development of cirrhosis and portal venous hypertension. 2. Liver is heterogeneous in appearance without discrete focal mass. 3. Splenomegaly. 4. 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    Assessment & Plan: 1-acute GI bleeding/acute blood loss anemia. -With concerns for esophageal varices versus lower GI bleed given prior history of hemorrhoidectomy. -Patient with ongoing alcoholic cirrhosis and portal hypertension. -Positive ascites. -Continue Rocephin -Continue to follow hemoglobin trend and further transfuse as needed -Patient is s/p 2 units of fresh frozen plasma and 1 unit PRBC -Transfusion threshold hemoglobin less than 7.5, in the setting of acute bleeding. -Gastroenterology service on board and will follow recommendations. -Continue IV PPI.  2-alcoholic cirrhosis/ascites -Ultrasound paracentesis planned for 06/07/2021 -Follow culture result -Empirically covering for SBP with  Rocephin. -Continue current diuretic dose -Continue with propanolol at current dose. -Check ammonia level in a.m.  3-Alcohol use disorder, severe, dependence ([place]) -Prior history of withdrawal in the past -Continue CIWA protocol -Cessation counseling provided -Continue thiamine and folic acid.  4-Thrombocytopenia ([place]) -In the setting of cirrhosis -Avoid heparin products -SCDs for DVT prophylaxis.    5-possible UTI -Daily urinalysis -Culture pending -Currently covered by the use of Rocephin.    DVT prophylaxis: SCDs Code Status: Full code Family Communication: No family at bedside. Disposition:   Status is: Inpatient  Remains inpatient appropriate because:IV treatments appropriate due to intensity of illness or inability to take PO  Dispo: The patient is from: Home              Anticipated d/c is to: Home              Patient currently is not medically stable to d/c.   Difficult to place patient No    Consultants:  Gastroenterology service  Procedures:  See below for x-ray report Paracentesis (planned for 06/07/2021)  Antimicrobials:  Rocephin.   Subjective: Reports abdominal distention and mild pain on palpation; no nausea, no vomiting, no chest pain, no shortness of breath.  Denies operative bleeding.  Objective: Vitals:   06/06/21 0227 06/06/21 0303 06/06/21 0454 06/06/21 1405  BP: 112/78 129/88 117/74 124/69  Pulse: 70 73 71 61  Resp: '16 18 18 16  '$ Temp: 98.3 F (36.8 C) 98.6 F (37 C) 98.1 F (36.7 C) 98.2 F (36.8 C)  TempSrc: Oral Oral Oral Oral  SpO2: 99% 100% 100% 98%  Weight:      Height:        Intake/Output Summary (Last 24 hours) at 06/06/2021 1756 Last data filed at 06/06/2021 1200 Gross  per 24 hour  Intake 1203.33 ml  Output --  Net 1203.33 ml   Filed Weights   06/05/21 1501  Weight: 91.3 kg    Examination:  General exam: Appears calm and comfortable; reporting mild discomfort in her abdomen; no nausea or vomiting.  Positive  icterus. Respiratory system: Clear to auscultation. Respiratory effort normal.  No using accessory muscle. Cardiovascular system: S1 & S2 heard, RRR. No JVD, murmurs, rubs, gallops or clicks. No pedal edema. Gastrointestinal system: Abdomen is distended, positive fluid wave appreciated; positive bowel sounds.  No guarding. Central nervous system: Alert and oriented. No focal neurological deficits. Extremities: No cyanosis or clubbing. Skin: No rashes, no petechiae. Psychiatry: Judgement and insight appear normal. Mood & affect appropriate.     Data Reviewed: I have personally reviewed following labs and imaging studies  CBC: Recent Labs  Lab 06/05/21 1514 06/05/21 2119 06/06/21 0602  WBC 6.2  --  4.4  HGB 8.5* 7.8* 8.6*  HCT 27.7* 25.9* 27.8*  MCV 80.3  --  81.5  PLT 79*  --  65*    Basic Metabolic Panel: Recent Labs  Lab 06/05/21 1514 06/06/21 0602  NA 134* 137  K 3.5 3.3*  CL 104 104  CO2 24 27  GLUCOSE 94 80  BUN 5* 5*  CREATININE 0.34* <0.30*  CALCIUM 7.0* 7.3*  MG  --  1.6*  PHOS  --  3.6    GFR: CrCl cannot be calculated (This lab value cannot be used to calculate CrCl because it is not a number: <0.30).  Liver Function Tests: Recent Labs  Lab 06/05/21 1514 06/06/21 0602  AST 176* 158*  ALT 26 29  ALKPHOS 164* 150*  BILITOT 6.2* 6.8*  PROT 5.9* 5.9*  ALBUMIN 2.1* 2.3*    CBG: Recent Labs  Lab 06/06/21 0012 06/06/21 1140  GLUCAP 76 78     No results found for this or any previous visit (from the past 240 hour(s)).    Radiology Studies: CT ABDOMEN PELVIS W CONTRAST  Result Date: 06/05/2021 CLINICAL DATA:  Abdominal pain, distention, and swelling of both LOWER extremities. Bright red bloody stools for 1 week. Prior cholecystectomy. EXAM: CT ABDOMEN AND PELVIS WITH CONTRAST TECHNIQUE: Multidetector CT imaging of the abdomen and pelvis was performed using the standard protocol following bolus administration of intravenous contrast. CONTRAST:   58m OMNIPAQUE IOHEXOL 350 MG/ML SOLN COMPARISON:  09/20/2017 FINDINGS: Lower chest: Lung bases are unremarkable.  Heart size is normal. Hepatobiliary: Significant ascites. Interval development of nodular, heterogeneous appearance of the liver without discrete focal lesion. Recanalized umbilical vein. Prominent caudate lobe. Prior cholecystectomy. Pancreas: Unremarkable. No pancreatic ductal dilatation or surrounding inflammatory changes. Spleen: Enlarged spleen without focal lesion. Adrenals/Urinary Tract: Normal adrenal glands. Kidneys are symmetric in size and enhancement. No hydronephrosis or suspicious renal mass. No evidence for ureteral obstruction. Decompressed urinary bladder is unremarkable. 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 Stomach is otherwise normal in appearance. Small bowel loops are normal in appearance. Colon is unremarkable. The appendix is well seen and has a normal appearance. 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 There is normal vascular opacification of the celiac axis, superior mesenteric artery, and inferior mesenteric artery. Normal appearance of the portal venous system and inferior vena cava. No retroperitoneal or mesenteric adenopathy. Reproductive: Uterus is present. Probable tubal ligation clip in the RIGHT adnexal region. A fatty attenuation partially calcified mass is identified in the LEFT adnexal region is consistent with LEFT ovarian dermoid,  2.7 centimeters and previously 3.4 centimeters. Other: Significant ascites throughout the abdomen and pelvis. Body wall edema. Musculoskeletal: No acute or significant osseous findings. IMPRESSION: 1. Interval development of cirrhosis and portal venous hypertension. 2. Liver is heterogeneous in appearance without discrete focal mass. 3. Splenomegaly. 4. 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 5. No evidence for acute urinary tract or gastrointestinal  tract abnormality. 6. Smaller LEFT ovarian dermoid. 7. Significant body wall edema. Electronically Signed   By: Nolon Nations M.D.   On: 06/05/2021 17:23     Scheduled Meds:  diazepam  2 mg Oral TID   folic acid  1 mg Oral Daily   hydrocortisone   Rectal BID   multivitamin with minerals  1 tablet Oral Daily   pantoprazole (PROTONIX) IV  40 mg Intravenous Q12H   propranolol  20 mg Oral BID   sodium chloride flush  3 mL Intravenous Q12H   sodium chloride flush  3 mL Intravenous Q12H   sodium chloride flush  3 mL Intravenous Q12H   spironolactone  25 mg Oral BID   thiamine  100 mg Oral Daily   Or   thiamine  100 mg Intravenous Daily   Continuous Infusions:  sodium chloride     sodium chloride     albumin human     cefTRIAXone (ROCEPHIN)  IV Stopped (06/05/21 2038)     LOS: 1 day    Time spent: 35 minutes.   Barton Dubois, MD [facility]   To contact the attending provider between 7A-7P or the covering provider during after hours 7P-7A, please log into the web site [email or web address] and access using universal [place] password for that web site. If you do not have the password, please call the hospital operator.  06/06/2021, 5:56 PM

## 2021-06-07 ENCOUNTER — Inpatient Hospital Stay (HOSPITAL_COMMUNITY): Payer: Medicaid Other

## 2021-06-07 ENCOUNTER — Encounter (HOSPITAL_COMMUNITY): Payer: Self-pay | Admitting: Family Medicine

## 2021-06-07 DIAGNOSIS — K7031 Alcoholic cirrhosis of liver with ascites: Secondary | ICD-10-CM

## 2021-06-07 DIAGNOSIS — K7030 Alcoholic cirrhosis of liver without ascites: Secondary | ICD-10-CM

## 2021-06-07 LAB — BODY FLUID CELL COUNT WITH DIFFERENTIAL
Lymphs, Fluid: 38 %
Monocyte-Macrophage-Serous Fluid: 53 % (ref 50–90)
Neutrophil Count, Fluid: 9 % (ref 0–25)
Total Nucleated Cell Count, Fluid: 149 cu mm (ref 0–1000)

## 2021-06-07 LAB — TYPE AND SCREEN
ABO/RH(D): O POS
Antibody Screen: NEGATIVE
Unit division: 0

## 2021-06-07 LAB — GLUCOSE, CAPILLARY
Glucose-Capillary: 80 mg/dL (ref 70–99)
Glucose-Capillary: 115 mg/dL — ABNORMAL HIGH (ref 70–99)
Glucose-Capillary: 114 mg/dL — ABNORMAL HIGH (ref 70–99)

## 2021-06-07 LAB — COMPREHENSIVE METABOLIC PANEL
ALT: 22 U/L (ref 0–44)
AST: 123 U/L — ABNORMAL HIGH (ref 15–41)
Albumin: 2.3 g/dL — ABNORMAL LOW (ref 3.5–5.0)
Alkaline Phosphatase: 134 U/L — ABNORMAL HIGH (ref 38–126)
Anion gap: 3 — ABNORMAL LOW (ref 5–15)
BUN: 6 mg/dL (ref 6–20)
CO2: 26 mmol/L (ref 22–32)
Calcium: 7.4 mg/dL — ABNORMAL LOW (ref 8.9–10.3)
Chloride: 106 mmol/L (ref 98–111)
Creatinine, Ser: 0.35 mg/dL — ABNORMAL LOW (ref 0.44–1.00)
GFR, Estimated: >60 mL/min (ref >60)
Glucose, Bld: 91 mg/dL (ref 70–99)
Potassium: 3.8 mmol/L (ref 3.5–5.1)
Sodium: 135 mmol/L (ref 135–145)
Total Bilirubin: 6.8 mg/dL — ABNORMAL HIGH (ref 0.3–1.2)
Total Protein: 5.4 g/dL — ABNORMAL LOW (ref 6.5–8.1)

## 2021-06-07 LAB — GRAM STAIN

## 2021-06-07 LAB — CBC
HCT: 27.8 % — ABNORMAL LOW (ref 36.0–46.0)
Hemoglobin: 8.4 g/dL — ABNORMAL LOW (ref 12.0–15.0)
MCH: 25.1 pg — ABNORMAL LOW (ref 26.0–34.0)
MCHC: 30.2 g/dL (ref 30.0–36.0)
MCV: 83 fL (ref 80.0–100.0)
Platelets: 62 10*3/uL — ABNORMAL LOW (ref 150–400)
RBC: 3.35 MIL/uL — ABNORMAL LOW (ref 3.87–5.11)
RDW: 23.3 % — ABNORMAL HIGH (ref 11.5–15.5)
WBC: 4.9 10*3/uL (ref 4.0–10.5)
nRBC: 0 % (ref 0.0–0.2)

## 2021-06-07 LAB — URINE CULTURE

## 2021-06-07 LAB — IRON AND TIBC
Iron: 55 ug/dL (ref 28–170)
Saturation Ratios: 30 % (ref 10.4–31.8)
TIBC: 184 ug/dL — ABNORMAL LOW (ref 250–450)
UIBC: 129 ug/dL

## 2021-06-07 LAB — AMMONIA: Ammonia: 41 umol/L — ABNORMAL HIGH (ref 9–35)

## 2021-06-07 LAB — HEPATITIS B SURFACE ANTIBODY, QUANTITATIVE: Hep B S AB Quant (Post): 233 m[IU]/mL (ref >9.9)

## 2021-06-07 LAB — PROTIME-INR
INR: 2.4 — ABNORMAL HIGH (ref 0.8–1.2)
Prothrombin Time: 25.9 seconds — ABNORMAL HIGH (ref 11.4–15.2)

## 2021-06-07 LAB — BPAM RBC
Blood Product Expiration Date: 202209112359
ISSUE DATE / TIME: 202208070234
Unit Type and Rh: 5100

## 2021-06-07 LAB — FERRITIN: Ferritin: 22 ng/mL (ref 11–307)

## 2021-06-07 LAB — MAGNESIUM: Magnesium: 2 mg/dL (ref 1.7–2.4)

## 2021-06-07 MED ORDER — ENSURE ENLIVE PO LIQD
237.0000 mL | Freq: Two times a day (BID) | ORAL | Status: DC
  Administered 2021-06-07 – 2021-06-11 (×8): 237 mL via ORAL

## 2021-06-07 MED ORDER — MORPHINE SULFATE (PF) 4 MG/ML IV SOLN: 4.0000 mg | Freq: Once | INTRAVENOUS | Status: DC

## 2021-06-07 MED ORDER — MORPHINE SULFATE (PF) 2 MG/ML IV SOLN
2.0000 mg | Freq: Once | INTRAVENOUS | Status: AC
  Administered 2021-06-07: 2 mg via INTRAVENOUS
  Filled 2021-06-07: qty 1

## 2021-06-07 MED ORDER — PREDNISOLONE 5 MG PO TABS
40.0000 mg | ORAL_TABLET | Freq: Every day | ORAL | Status: DC
  Administered 2021-06-07: 40 mg via ORAL
  Filled 2021-06-07 (×3): qty 8

## 2021-06-07 NOTE — Progress Notes (Addendum)
Subjective: Denies overt GI bleeding. No confusion or mental status changes. Notes abdominal distension is less although still present. Notes one month history of abdominal pain, reported as lower abdomen. Worsened with eating postprandially and located "all over" but worse bilateral sides of abdomen. No specific epigastric pain with eating. Last EGD in 2018 at [facility] she was not aware of CT findings regarding cirrhosis. Feels it may be difficult to stop drinking. Lives with her mother, who is a support person. States although difficult, she "knows I have to". States she is allergic to Vit K, which caused hives and shortness of breath.   Objective: Vital signs in last 24 hours: Temp:  [97.6 F (36.4 C)-99 F (37.2 C)] 97.6 F (36.4 C) (08/08 0525) Pulse Rate:  [58-74] 61 (08/08 0913) Resp:  [16-18] 18 (08/08 0525) BP: (85-124)/(62-84) 120/84 (08/08 0913) SpO2:  [98 %-99 %] 99 % (08/08 0525) Last BM Date: 06/05/21 General:   Alert and oriented, pleasant Head:  Normocephalic and atraumatic. Eyes:  +scleral icterus  Abdomen:  Bowel sounds present, distended but soft, non-tense. TTP diffusely but predominantly lower abdomen bilaterally.  Extremities:  With 1+ pitting edema bilaterally, mild pedal edema Neurologic:  Alert and  oriented x4;  negative asterixis  Psych:  Alert and cooperative. Normal mood and affect.  Intake/Output from previous day: 08/07 0701 - 08/08 0700 In: 366.9 [P.O.:240; IV Piggyback:126.9] Out: -  Intake/Output this shift: No intake/output data recorded.  Lab Results: Recent Labs    06/05/21 1514 06/05/21 2119 06/06/21 0602 06/07/21 0201  WBC 6.2  --  4.4 4.9  HGB 8.5* 7.8* 8.6* 8.4*  HCT 27.7* 25.9* 27.8* 27.8*  PLT 79*  --  65* 62*   BMET Recent Labs    06/05/21 1514 06/06/21 0602 06/07/21 0201  NA 134* 137 135  K 3.5 3.3* 3.8  CL 104 104 106  CO2 '24 27 26  '$ GLUCOSE 94 80 91  BUN 5* 5* 6  CREATININE 0.34* <0.30* 0.35*  CALCIUM  7.0* 7.3* 7.4*   LFT Recent Labs    06/05/21 1514 06/06/21 0602 06/07/21 0201  PROT 5.9* 5.9* 5.4*  ALBUMIN 2.1* 2.3* 2.3*  AST 176* 158* 123*  ALT '26 29 22  '$ ALKPHOS 164* 150* 134*  BILITOT 6.2* 6.8* 6.8*   PT/INR Recent Labs    06/06/21 0602 06/07/21 0201  LABPROT 22.9* 25.9*  INR 2.0* 2.4*   Hepatitis Panel Recent Labs    06/06/21 1406  HEPBSAG NON REACTIVE  HCVAB NON REACTIVE    Studies/Results: CT ABDOMEN PELVIS W CONTRAST  Result Date: 06/05/2021 CLINICAL DATA:  Abdominal pain, distention, and swelling of both LOWER extremities. Bright red bloody stools for 1 week. Prior cholecystectomy. EXAM: CT ABDOMEN AND PELVIS WITH CONTRAST TECHNIQUE: Multidetector CT imaging of the abdomen and pelvis was performed using the standard protocol following bolus administration of intravenous contrast. CONTRAST:  20m OMNIPAQUE IOHEXOL 350 MG/ML SOLN COMPARISON:  09/20/2017 FINDINGS: Lower chest: Lung bases are unremarkable.  Heart size is normal. Hepatobiliary: Significant ascites. Interval development of nodular, heterogeneous appearance of the liver without discrete focal lesion. Recanalized umbilical vein. Prominent caudate lobe. Prior cholecystectomy. Pancreas: Unremarkable. No pancreatic ductal dilatation or surrounding inflammatory changes. Spleen: Enlarged spleen without focal lesion. Adrenals/Urinary Tract: Normal adrenal glands. Kidneys are symmetric in size and enhancement. No hydronephrosis or suspicious renal mass. No evidence for ureteral obstruction. Decompressed urinary bladder is unremarkable. 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 Stomach  is otherwise normal in appearance. Small bowel loops are normal in appearance. Colon is unremarkable. The appendix is well seen and has a normal appearance. 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 There is normal vascular opacification of the celiac axis, superior  mesenteric artery, and inferior mesenteric artery. Normal appearance of the portal venous system and inferior vena cava. No retroperitoneal or mesenteric adenopathy. Reproductive: Uterus is present. Probable tubal ligation clip in the RIGHT adnexal region. A fatty attenuation partially calcified mass is identified in the LEFT adnexal region is consistent with LEFT ovarian dermoid, 2.7 centimeters and previously 3.4 centimeters. Other: Significant ascites throughout the abdomen and pelvis. Body wall edema. Musculoskeletal: No acute or significant osseous findings. IMPRESSION: 1. Interval development of cirrhosis and portal venous hypertension. 2. Liver is heterogeneous in appearance without discrete focal mass. 3. Splenomegaly. 4. 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 5. No evidence for acute urinary tract or gastrointestinal tract abnormality. 6. Smaller LEFT ovarian dermoid. 7. Significant body wall edema. Electronically Signed   By: Nolon Nations M.D.   On: 06/05/2021 17:23      Assessment: 40 year old female with history of cirrhosis likely due to alcohol, presenting with decompensated disease, rectal bleeding, abdominal pain, and alcoholic hepatitis. Prior history significant for SBP in 2018 at [facility], prior decompensated liver disease, rectal bleeding due to hemorrhoids.   Cirrhosis: MELD Na 24 today. Hep B surface antigen negative and Hep C antibody negative. Additional work-up in 2018 with negative ASMA, ANA, AMA. Ceruloplasmin low at 14 in 2018, 24 hour urine copper elevated at 97. Liver biopsy 2018 with steaotohepatitis and fibrosis stage 2 of 4. Copper weight studies with 54mg/g dry weight.   Empiric antibiotics have been started. Abdomen distended but soft and anticipate will not have large volume ascites; however, with abdominal pain, need to rule out SBP. [facility] today with fluid analysis. Lower extremity edema improved. Received course of IV albumin with last dose this  afternoon at 1400. Has been receiving IV lasix ranging from 20-40 mg daily, last dose 40 mg on 8/7. Continue spironolactone 25 mg po BID: this can be grouped together as one dose each morning. Start oral lasix tomorrow if renal function continues to allow. Will need EGD likely prior to discharge due to varices. Last in 2018 with portal gastropathy.   Rectal bleeding: likely benign source. Prior hemorrhoidectomy in March 2018 and [facility] No further rectal bleeding. Hgb stable.   Anemia: multifactorial in setting of chronic disease, low-volume rectal bleeding, chronic ETOH use. Hgb similar to prior values. Check iron studies as none recently on file.   Alcoholic hepatitis: DF of 56.9 with PT control of 15 and 75.3 with PT control of 11. Would benefit from prednisolone therapy but awaiting fluid analysis from [facility]. AST slowly trendin down, bilirubin will likely have a lag effect and trends slower.   Abdominal pain: noted as diffusely and worse with eating. Lipase 43 on admission and CT without evidence for pancreatitis. Awaiting [facility]. Recommend EGD prior to discharge.   Coagulopathy: INR 2.4, previously 2 yesterday and 2.3 on admission. Allergy to Vit K. Continue to trend.  Plan: [facility] today with fluid analysis Continue ceftriaxone Check iron studies Continue spironolactone and start oral lasix tomorrow Continue Anusol BID Continue PPI BID EGD prior to discharge Needs STRICT I/0s: urine output not being measured. Have requested this Daily weights: last recorded was 8/6 Prednisolone to be  determined after [place] para   Annitta Needs, PhD, ANP-BC [facility]      LOS: 2 days    06/07/2021, 9:30 AM

## 2021-06-07 NOTE — Procedures (Signed)
PreOperative Dx: Cirrhosis, ascites Postoperative Dx: Cirrhosis, ascites Procedure:   US guided paracentesis Radiologist:  Thornton Papas Anesthesia:  10 ml of1% lidocaine Specimen:  1.9 L of cloudy yellow ascitic fluid EBL:   < 1 ml Complications: None

## 2021-06-07 NOTE — Progress Notes (Signed)
Patient's bp is 90/52. MD Dyann Kief made aware.

## 2021-06-07 NOTE — Sedation Documentation (Signed)
PT tolerated left sided paracentesis procedure well today and 1.3 Liters of clear dark yellow fluid removed with labs collected and sent for processing. PT verbalized understanding of procedure instructions and vital signs remained stable at completion of procedure. PT returned via wheelchair to inpatient bed assignment with NAD noted.

## 2021-06-07 NOTE — Progress Notes (Signed)
PROGRESS NOTE    Victoria Gordon  [id number] DOB: October 26, 1981 DOA: 06/05/2021 PCP: Patient, [name]    Chief Complaint  Patient presents with   Abdominal Pain    Brief admission narrative:  Victoria Gordon  is a 40 y.o. female female with longstanding history of alcohol abuse, who unfortunately recently relapsed and has been drinking again, as well as history of alcoholic liver cirrhosis with portal hypertension, ascites and chronic thrombocytopenia and elevated INR presents with concerns about bright red blood per rectum and melena, patient also has hemorrhoids -Patient endorses abdominal pain,.  Some nausea but no emesis No fever  Or chills -No productive cough  -In the ED UA suspicious for UTI -Lipase is 43, bilirubin is 6.2 potassium is 3.5 -Hemoglobin is down to 8.5, repeat hemoglobin is 7.8--baseline usually above 9  AST to ALT ratio is more than 2:1 -No chest pains no palpitations no dizziness CT abdomen and pelvis shows-IMPRESSION: 1. Interval development of cirrhosis and portal venous hypertension. 2. Liver is heterogeneous in appearance without discrete focal mass. 3. Splenomegaly. 4. 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    Assessment & Plan: 1-acute GI bleeding/acute blood loss anemia. -With concerns for esophageal varices versus lower GI bleed given prior history of hemorrhoidectomy. -Patient with ongoing alcoholic cirrhosis and portal hypertension. -Positive ascites -Continue Rocephin -Continue to follow hemoglobin trend and further transfuse as needed -Patient is status post 2 units of fresh frozen plasma and 1 unit PRBC;  -Transfusion threshold hemoglobin less than 7.5, in the setting of acute bleeding. -Gastroenterology service on board and will follow recommendations. -Continue IV PPI.  2-alcoholic cirrhosis/ascites -Ultrasound paracentesis planned for later today 06/07/2021 -Follow culture result -Empirically covering  for SBP with Rocephin. -Continue current diuretic dose -Continue with propanolol at current dose. -Check ammonia level in a.m.  3-Alcohol use disorder, severe, dependence ([place]) -Prior history of withdrawal in the past;  -Continue CIWA protocol -Cessation counseling provided -Continue thiamine and folic acid.  4-Thrombocytopenia ([place]) -In the setting of cirrhosis -Avoid heparin products -SCDs for DVT prophylaxis.    5-possible UTI -Daily urinalysis -Culture pending -Currently covered by the use of Rocephin. -    DVT prophylaxis: SCDs Code Status: Full code Family Communication: No family at bedside. Disposition:   Status is: Inpatient  Remains inpatient appropriate because:IV treatments appropriate due to intensity of illness or inability to take PO  Dispo: The patient is from: Home              Anticipated d/c is to: Home              Patient currently is not medically stable to d/c.   Difficult to place patient No    Consultants:  Gastroenterology service  Procedures:  See below for x-ray report Paracentesis (planned for 06/07/2021)  Antimicrobials:  Rocephin.   Subjective: No overnight bleeding reported; patient denies chest pain, no nausea, no vomiting, no shortness of breath.  Breast intermittent abdominal discomfort and also expressed an increase in belly distention.  Patient with positive icterus  Objective: Vitals:   06/07/21 1129 06/07/21 1145 06/07/21 1329 06/07/21 1337  BP: 114/82 114/73 (!) 91/56 (!) 90/52  Pulse: 60 61 66   Resp: '18 18 18   '$ Temp: 97.6 F (36.4 C)  98.7 F (37.1 C)   TempSrc: Oral  Oral   SpO2: 98% 98% 99%   Weight:      Height:        Intake/Output Summary (  Last 24 hours) at 06/07/2021 1357 Last data filed at 06/07/2021 1000 Gross per 24 hour  Intake 606.9 ml  Output --  Net 606.9 ml   Filed Weights   06/05/21 1501  Weight: 91.3 kg    Examination: General exam: Alert, awake, oriented x 3; no chest pain, no nausea,  no vomiting.  Reports abdominal distention and pain.  Positive icterus.  No operative bleeding. Respiratory system: Clear to auscultation. Respiratory effort normal.  Good saturation on room air.  No using accessory muscle. Cardiovascular system:RRR. No murmurs, rubs, gallops.  No JVD. Gastrointestinal system: Abdomen is mildly distended; tender to palpation and with the presence of fluid wave suggesting ascites on examination.  Positive bowel sounds. Central nervous system: Alert and oriented. No focal neurological deficits. Extremities: No cyanosis or clubbing. Skin: No petechiae. Psychiatry: Judgement and insight appear normal. Mood & affect appropriate.    Data Reviewed: I have personally reviewed following labs and imaging studies  CBC: Recent Labs  Lab 06/05/21 1514 06/05/21 2119 06/06/21 0602 06/07/21 0201  WBC 6.2  --  4.4 4.9  HGB 8.5* 7.8* 8.6* 8.4*  HCT 27.7* 25.9* 27.8* 27.8*  MCV 80.3  --  81.5 83.0  PLT 79*  --  65* 62*    Basic Metabolic Panel: Recent Labs  Lab 06/05/21 1514 06/06/21 0602 06/07/21 0201  NA 134* 137 135  K 3.5 3.3* 3.8  CL 104 104 106  CO2 '24 27 26  '$ GLUCOSE 94 80 91  BUN 5* 5* 6  CREATININE 0.34* <0.30* 0.35*  CALCIUM 7.0* 7.3* 7.4*  MG  --  1.6* 2.0  PHOS  --  3.6  --     GFR: Estimated Creatinine Clearance: 98.1 mL/min (A) (by C-G formula based on SCr of 0.35 mg/dL (L)).  Liver Function Tests: Recent Labs  Lab 06/05/21 1514 06/06/21 0602 06/07/21 0201  AST 176* 158* 123*  ALT '26 29 22  '$ ALKPHOS 164* 150* 134*  BILITOT 6.2* 6.8* 6.8*  PROT 5.9* 5.9* 5.4*  ALBUMIN 2.1* 2.3* 2.3*    CBG: Recent Labs  Lab 06/06/21 1140 06/06/21 1801 06/06/21 2345 06/07/21 0559 06/07/21 1104  GLUCAP 78 94 97 80 115*     Recent Results (from the past 240 hour(s))  Urine Culture     Status: Abnormal   Collection Time: 06/05/21 10:43 PM   Specimen: Urine, Clean Catch  Result Value Ref Range Status   Specimen Description   Final     URINE, CLEAN CATCH Performed at [facility], [address] [place], [place] [address]    Special Requests   Final    NONE Performed at [facility], [address], [place], [place] [address]    Culture MULTIPLE SPECIES PRESENT, SUGGEST RECOLLECTION (A)  Final   Report Status 06/07/2021 FINAL  Final  Culture, body fluid w Gram Stain-bottle     Status: None (Preliminary result)   Collection Time: 06/07/21 11:35 AM   Specimen: Peritoneal Washings  Result Value Ref Range Status   Specimen Description PERITONEAL  Final   Special Requests   Final    BOTTLES DRAWN AEROBIC AND ANAEROBIC 10CC Performed at [facility], [address] [place], [place] [address]    Culture PENDING  Incomplete   Report Status PENDING  Incomplete  Gram stain     Status: None   Collection Time: 06/07/21 11:35 AM   Specimen: Peritoneal Washings  Result Value Ref Range Status   Specimen Description PERITONEAL  Final  Special Requests PERITONEAL  Final   Gram Stain   Final    ABUNDANT WBC PRESENT, PREDOMINANTLY MONONUCLEAR NO ORGANISMS SEEN Performed at [facility], [address], [place], [place] [address]    Report Status 06/07/2021 FINAL  Final      Radiology Studies: CT ABDOMEN PELVIS W CONTRAST  Result Date: 06/05/2021 CLINICAL DATA:  Abdominal pain, distention, and swelling of both LOWER extremities. Bright red bloody stools for 1 week. Prior cholecystectomy. EXAM: CT ABDOMEN AND PELVIS WITH CONTRAST TECHNIQUE: Multidetector CT imaging of the abdomen and pelvis was performed using the standard protocol following bolus administration of intravenous contrast. CONTRAST:  31m OMNIPAQUE IOHEXOL 350 MG/ML SOLN COMPARISON:  09/20/2017 FINDINGS: Lower chest: Lung bases are unremarkable.  Heart size is normal. Hepatobiliary: Significant ascites. Interval development of nodular, heterogeneous appearance of the liver without discrete focal lesion. Recanalized umbilical vein. Prominent caudate lobe. Prior  cholecystectomy. Pancreas: Unremarkable. No pancreatic ductal dilatation or surrounding inflammatory changes. Spleen: Enlarged spleen without focal lesion. Adrenals/Urinary Tract: Normal adrenal glands. Kidneys are symmetric in size and enhancement. No hydronephrosis or suspicious renal mass. No evidence for ureteral obstruction. Decompressed urinary bladder is unremarkable. 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 Stomach is otherwise normal in appearance. Small bowel loops are normal in appearance. Colon is unremarkable. The appendix is well seen and has a normal appearance. 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 There is normal vascular opacification of the celiac axis, superior mesenteric artery, and inferior mesenteric artery. Normal appearance of the portal venous system and inferior vena cava. No retroperitoneal or mesenteric adenopathy. Reproductive: Uterus is present. Probable tubal ligation clip in the RIGHT adnexal region. A fatty attenuation partially calcified mass is identified in the LEFT adnexal region is consistent with LEFT ovarian dermoid, 2.7 centimeters and previously 3.4 centimeters. Other: Significant ascites throughout the abdomen and pelvis. Body wall edema. Musculoskeletal: No acute or significant osseous findings. IMPRESSION: 1. Interval development of cirrhosis and portal venous hypertension. 2. Liver is heterogeneous in appearance without discrete focal mass. 3. Splenomegaly. 4. 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 5. No evidence for acute urinary tract or gastrointestinal tract abnormality. 6. Smaller LEFT ovarian dermoid. 7. Significant body wall edema. Electronically Signed   By: ENolon NationsM.D.   On: 06/05/2021 17:23   UKoreaParacentesis  Result Date: 06/07/2021 BLavonia Dana MD     06/07/2021 12:22 PM PreOperative Dx: Cirrhosis, ascites Postoperative Dx: Cirrhosis, ascites Procedure:   [place] guided paracentesis Radiologist:  BThornton PapasAnesthesia:  10 ml of1% lidocaine Specimen:  1.9 L of cloudy yellow ascitic fluid EBL:   < 1 ml Complications: None      Scheduled Meds:  diazepam  2 mg Oral TID   folic acid  1 mg Oral Daily   hydrocortisone   Rectal BID   multivitamin with minerals  1 tablet Oral Daily   pantoprazole (PROTONIX) IV  40 mg Intravenous Q12H   propranolol  20 mg Oral BID   sodium chloride flush  3 mL Intravenous Q12H   sodium chloride flush  3 mL Intravenous Q12H   sodium chloride flush  3 mL Intravenous Q12H   spironolactone  25 mg Oral BID   thiamine  100 mg Oral Daily   Or   thiamine  100 mg Intravenous Daily   Continuous Infusions:  sodium chloride     sodium chloride     albumin human 25 g (06/07/21 0607)   cefTRIAXone (ROCEPHIN)  IV 200 mL/hr at 06/06/21 1851     LOS: 2 days    Time spent: 35 minutes.   Barton Dubois, MD [facility]   To contact the attending provider between 7A-7P or the covering provider during after hours 7P-7A, please log into the web site [email or web address] and access using universal [place] password for that web site. If you do not have the password, please call the hospital operator.  06/07/2021, 1:57 PM

## 2021-06-07 NOTE — Progress Notes (Addendum)
Initial Nutrition Assessment  DOCUMENTATION CODES:   Obesity unspecified  INTERVENTION:  Ensure Enlive ([place]rawberry) preferred, BID between meals  Heart Healthy diet  Education: Alcohol and Nutrition and Omega-3 Fatty Acids  NUTRITION DIAGNOSIS:   Increased nutrient needs related to acute illness, chronic illness (alcholic hepatitis, cirrhosis) as evidenced by estimated needs.   GOAL:  Patient will meet greater than or equal to 90% of their needs  MONITOR:  PO intake, Supplement acceptance, Labs, I & O's, Weight trends  REASON FOR ASSESSMENT:   Malnutrition Screening Tool    ASSESSMENT: Patient is an obese 40 yo female with ETOH use, cirrhosis and hemorrhoids. She presents with abdominal pain, s/p left sided paracentesis 1.9 liter fluid removed.   Patient is resting after procedure. PO at lunch 100%. Recommend 4-6 small meals or ONS daily due to cirrhosis.  Medications reviewed and include: folic acid, MVI, Thiamine, Protonix.  Daily weights and strict I/O's per GI. Patient weight 8/6 was 91.3 kg and 86.5 kg in December 2021. Expect decrease following removal of fluid.  Labs: BMP Latest Ref Rng & Units 06/07/2021 06/06/2021 06/05/2021  Glucose 70 - 99 mg/dL 91 80 94  BUN 6 - 20 mg/dL 6 5(L) 5(L)  Creatinine 0.44 - 1.00 mg/dL 0.35(L) <0.30(L) 0.34(L)  Sodium 135 - 145 mmol/L 135 137 134(L)  Potassium 3.5 - 5.1 mmol/L 3.8 3.3(L) 3.5  Chloride 98 - 111 mmol/L 106 104 104  CO2 22 - 32 mmol/L '26 27 24  '$ Calcium 8.9 - 10.3 mg/dL 7.4(L) 7.3(L) 7.0(L)      NUTRITION - FOCUSED PHYSICAL EXAM: Unable to complete Nutrition-Focused physical exam at this time.    Diet Order:   Diet Order             Diet Heart Room service appropriate? Yes; Fluid consistency: Thin  Diet effective now                   EDUCATION NEEDS:  Education needs have been addressed  Skin:  Skin Assessment: Reviewed RN Assessment (jaundice)  Last BM:  8/6  Height:   Ht Readings from Last 1  Encounters:  06/05/21 '5\' 1"'$  (1.549 m)    Weight:   Wt Readings from Last 1 Encounters:  06/05/21 91.3 kg    Ideal Body Weight:   48 kg  BMI:  Body mass index is 38.02 kg/m.  Estimated Nutritional Needs:   Kcal:  [id number]  Protein:  85-90 gr  Fluid:  1.8-2.0 liters  Colman Cater MS,RD,CSG,LDN Contact: Shea Evans

## 2021-06-08 ENCOUNTER — Encounter (HOSPITAL_COMMUNITY)
Admission: EM | Disposition: A | Discharge status: Home / Self Care | Payer: Self-pay | Source: Home / Self Care | Attending: Internal Medicine

## 2021-06-08 ENCOUNTER — Encounter (HOSPITAL_COMMUNITY): Payer: Self-pay | Admitting: Certified Registered"

## 2021-06-08 LAB — GLUCOSE, CAPILLARY
Glucose-Capillary: 114 mg/dL — ABNORMAL HIGH (ref 70–99)
Glucose-Capillary: 124 mg/dL — ABNORMAL HIGH (ref 70–99)
Glucose-Capillary: 154 mg/dL — ABNORMAL HIGH (ref 70–99)
Glucose-Capillary: 160 mg/dL — ABNORMAL HIGH (ref 70–99)
Glucose-Capillary: 166 mg/dL — ABNORMAL HIGH (ref 70–99)

## 2021-06-08 LAB — CBC
HCT: 28.2 % — ABNORMAL LOW (ref 36.0–46.0)
Hemoglobin: 8.6 g/dL — ABNORMAL LOW (ref 12.0–15.0)
MCH: 25.1 pg — ABNORMAL LOW (ref 26.0–34.0)
MCHC: 30.5 g/dL (ref 30.0–36.0)
MCV: 82.5 fL (ref 80.0–100.0)
Platelets: 65 10*3/uL — ABNORMAL LOW (ref 150–400)
RBC: 3.42 MIL/uL — ABNORMAL LOW (ref 3.87–5.11)
RDW: 22.7 % — ABNORMAL HIGH (ref 11.5–15.5)
WBC: 4.6 10*3/uL (ref 4.0–10.5)
nRBC: 0 % (ref 0.0–0.2)

## 2021-06-08 LAB — RESP PANEL BY RT-PCR (FLU A&B, COVID) ARPGX2
Influenza A by PCR: NEGATIVE
Influenza B by PCR: NEGATIVE
SARS Coronavirus 2 by RT PCR: POSITIVE — AB

## 2021-06-08 LAB — PROTIME-INR
INR: 2.6 — ABNORMAL HIGH (ref 0.8–1.2)
Prothrombin Time: 27.6 seconds — ABNORMAL HIGH (ref 11.4–15.2)

## 2021-06-08 LAB — COMPREHENSIVE METABOLIC PANEL
ALT: 21 U/L (ref 0–44)
AST: 112 U/L — ABNORMAL HIGH (ref 15–41)
Albumin: 2.5 g/dL — ABNORMAL LOW (ref 3.5–5.0)
Alkaline Phosphatase: 127 U/L — ABNORMAL HIGH (ref 38–126)
Anion gap: 3 — ABNORMAL LOW (ref 5–15)
BUN: <5 mg/dL — ABNORMAL LOW (ref 6–20)
CO2: 23 mmol/L (ref 22–32)
Calcium: 7.7 mg/dL — ABNORMAL LOW (ref 8.9–10.3)
Chloride: 107 mmol/L (ref 98–111)
Creatinine, Ser: 0.36 mg/dL — ABNORMAL LOW (ref 0.44–1.00)
GFR, Estimated: >60 mL/min (ref >60)
Glucose, Bld: 140 mg/dL — ABNORMAL HIGH (ref 70–99)
Potassium: 4.3 mmol/L (ref 3.5–5.1)
Sodium: 133 mmol/L — ABNORMAL LOW (ref 135–145)
Total Bilirubin: 5.7 mg/dL — ABNORMAL HIGH (ref 0.3–1.2)
Total Protein: 5.4 g/dL — ABNORMAL LOW (ref 6.5–8.1)

## 2021-06-08 SURGERY — ESOPHAGOGASTRODUODENOSCOPY (EGD) WITH PROPOFOL
Anesthesia: Monitor Anesthesia Care

## 2021-06-08 MED ORDER — MORPHINE SULFATE (PF) 2 MG/ML IV SOLN
2.0000 mg | Freq: Once | INTRAVENOUS | Status: AC
  Administered 2021-06-08: 2 mg via INTRAVENOUS
  Filled 2021-06-08: qty 1

## 2021-06-08 MED ORDER — PREDNISOLONE 5 MG PO TABS: 40.0000 mg | ORAL_TABLET | Freq: Every day | ORAL | Status: DC

## 2021-06-08 MED ORDER — ALBUMIN HUMAN 25 % IV SOLN
25.0000 g | Freq: Two times a day (BID) | INTRAVENOUS | Status: AC
  Administered 2021-06-08 (×2): 25 g via INTRAVENOUS
  Filled 2021-06-08 (×2): qty 100

## 2021-06-08 MED ORDER — FUROSEMIDE 20 MG PO TABS
20.0000 mg | ORAL_TABLET | Freq: Every day | ORAL | Status: DC
  Administered 2021-06-08 – 2021-06-11 (×4): 20 mg via ORAL
  Filled 2021-06-08 (×4): qty 1

## 2021-06-08 MED ORDER — POLYETHYLENE GLYCOL 3350 17 G PO PACK
17.0000 g | PACK | Freq: Every day | ORAL | Status: DC
  Administered 2021-06-08 – 2021-06-09 (×2): 17 g via ORAL
  Filled 2021-06-08: qty 1

## 2021-06-08 MED ORDER — SPIRONOLACTONE 25 MG PO TABS
50.0000 mg | ORAL_TABLET | Freq: Every day | ORAL | Status: DC
  Administered 2021-06-09 – 2021-06-11 (×3): 50 mg via ORAL
  Filled 2021-06-08 (×3): qty 2

## 2021-06-08 MED ORDER — PREDNISOLONE 5 MG PO TABS
40.0000 mg | ORAL_TABLET | Freq: Every day | ORAL | Status: DC
  Administered 2021-06-08 – 2021-06-11 (×3): 40 mg via ORAL
  Filled 2021-06-08 (×7): qty 8

## 2021-06-08 NOTE — Progress Notes (Deleted)
Patient HBG: 6.9. MD made aware. 

## 2021-06-08 NOTE — Progress Notes (Addendum)
Subjective: Continues with mild bilateral abdominal pain, worse with meals. Overall, about the same as admission. Started about 1 month ago in the setting of abdominal distension. Still feels her abdomen is distended after para yesterday. No significant change. No bright red blood per rectum since Sunday. No BM since that time. Had some black stools Wednesday or Thursday last week. Took Aleve on Thursday due to fever and headache. Doesn't usually take NSAIDs. No iron or pepto bismol.   Some trouble with concentration at times. This is chronic for her. No overt confusion.   Some nausea with vomiting x2 yesterday when getting medications. Some nausea postprandially for a couple of months. No history of GERD.   Objective: Vital signs in last 24 hours: Temp:  [97.6 F (36.4 C)-98.7 F (37.1 C)] 98.2 F (36.8 C) (08/09 0445) Pulse Rate:  [59-71] 59 (08/09 0445) Resp:  [18-20] 20 (08/09 0445) BP: (90-120)/(52-84) 90/68 (08/09 0445) SpO2:  [98 %-99 %] 98 % (08/09 0445) Weight:  [86.5 kg] 86.5 kg (08/09 0445) Last BM Date: 06/05/21 General:   Alert and oriented, pleasant Head:  Normocephalic and atraumatic. Eyes:  No icterus, sclera clear. Conjuctiva pink.  Abdomen:  Bowel sounds present, full but soft. Mild TTP across the lower abdomen and in epigastric and RUQ region. No rebound or guarding. No HSM or hernias noted.  No masses appreciated  Msk:  Symmetrical without gross deformities. Normal posture. Extremities:  With 1+ bilateral LE edema. Neurologic:  Alert and  oriented x4;  grossly normal neurologically. Skin:  Warm and dry, intact without significant lesions.  Psych: Normal mood and affect.  Intake/Output from previous day: 08/08 0701 - 08/09 0700 In: 680.3 [P.O.:560; IV Piggyback:120.3] Out: -  Intake/Output this shift: No intake/output data recorded.  Lab Results: Recent Labs    06/06/21 0602 06/07/21 0201 06/08/21 0403  WBC 4.4 4.9 4.6  HGB 8.6* 8.4* 8.6*  HCT  27.8* 27.8* 28.2*  PLT 65* 62* 65*   BMET Recent Labs    06/06/21 0602 06/07/21 0201 06/08/21 0403  NA 137 135 133*  K 3.3* 3.8 4.3  CL 104 106 107  CO2 '27 26 23  ' GLUCOSE 80 91 140*  BUN 5* 6 <5*  CREATININE <0.30* 0.35* 0.36*  CALCIUM 7.3* 7.4* 7.7*   LFT Recent Labs    06/06/21 0602 06/07/21 0201 06/08/21 0403  PROT 5.9* 5.4* 5.4*  ALBUMIN 2.3* 2.3* 2.5*  AST 158* 123* 112*  ALT '29 22 21  ' ALKPHOS 150* 134* 127*  BILITOT 6.8* 6.8* 5.7*   PT/INR Recent Labs    06/07/21 0201 06/08/21 0403  LABPROT 25.9* 27.6*  INR 2.4* 2.6*   Hepatitis Panel Recent Labs    06/06/21 1406  HEPBSAG NON REACTIVE  HCVAB NON REACTIVE     Studies/Results: US Paracentesis  Result Date: 06/07/2021 Lavonia Dana, MD     06/07/2021 12:22 PM PreOperative Dx: Cirrhosis, ascites Postoperative Dx: Cirrhosis, ascites Procedure:   US guided paracentesis Radiologist:  Thornton Papas Anesthesia:  10 ml of1% lidocaine Specimen:  1.9 L of cloudy yellow ascitic fluid EBL:   < 1 ml Complications: None     Assessment: 40 year old female with history of cirrhosis likely due to alcohol, presenting with decompensated disease, rectal bleeding, abdominal pain, and alcoholic hepatitis. Prior history significant for SBP in 2018 at [facility], prior decompensated liver disease, rectal bleeding due to hemorrhoids.  Cirrhosis: MELD 26 today. Hep B surface antigen negative and Hep C antibody negative. Additional work-up in  2018 with negative ASMA, ANA, AMA. Ceruloplasmin low at 14 in 2018, 24 hour urine copper elevated at 97. Liver biopsy 2018 with steaotohepatitis and fibrosis stage 2 of 4. Copper weight studies with 35mg/g dry weight.  Empiric antibiotics have been started. Para yesterday yielding 1.9 L, cell count negative for SBP.  Gram stain negative.  Culture pending.  Lower extremity edema improved since admission with course of IV albumin, Lasix, and spironolactone.  Last dose of albumin evening of 8/8.  Last dose  of Lasix 8/7.  Continues with spironolactone 25 mg twice daily which can be grouped together each morning. She is down 11 lbs since admission.  CT this admission with evidence of portal venous hypertension, large esophageal and LUQ varices.  Last EGD in 2018 with portal hypertensive gastropathy.  We have recommended repeat EGD this admission and will plan to complete this today.  She has been started on propranolol.  Heart rate 59 this morning.  Rectal bleeding: Likely benign source. Prior hemorrhoidectomy in March 2018 and [facility] No further rectal bleeding since Sunday, but no BM. Reports history of mild constipation. Will start MiraLAX daily. Hgb remains stable.  Anemia: Multifactorial in setting of chronic disease, low-volume rectal bleeding, reported melena last week, now resolved, chronic ETOH use. Hgb this admission fairly consistent with prior outpatient values.  Iron panel within normal limits, ferritin low normal at 22. We have recommended EGD this admission for evaluation of esophageal varices. This will rule out gastritis, esophagitis, duodenitis, and PUD as contributing etiology to melena and anemia.   Alcoholic hepatitis: DF 616.6-06.3(control 11.4-15.2).  This is up from 56.9-75.3 yesterday.  She started on prednisolone 40 mg daily on 8/8.  We will continue this with plans to calculate Lilly score on 8/15 to determine if prednisolone should be continued.  Clinically, she remained stable.  AST, alk phos, and T bili slowly downtrending, INR increased to 2.6 today from 2.4 yesterday.   Abdominal pain: Noted as diffusely and worse with eating. Also with postprandial nausea and reported melena prior to admission, now resolved. Lipase 43 on admission and CT without evidence for pancreatitis. Para negative for SBP. Abdominal exam with mild TTP across lower abdomen, in epigastric and RUQ region. Likely multifactorial in the setting  of alcoholic hepatitis, ascites, ? Mild constipation, and possible gastritis, esophagitis, duodenitis, or PUD. Planning on EGD today.   Coagulopathy: INR 2.6 today, up from 2.4 yesterday and 2.0, 2 days ago.  No overt GI bleeding.  This is in the setting of cirrhosis.  We will continue to trend.  She is allergic to vitamin K.   Plan: EGD with propofol with Dr. CJenetta Downertoday. The risks, benefits, and alternatives have been discussed with the patient in detail. The patient states understanding and desires to proceed. Continue prednisolone 40 mg daily.  Calculate Lilly score on 8/15. Continue propranolol. Continue spironolactone.  We will group 25 mg twice daily together tomorrow morning.   Start low-dose oral Lasix today at 20 mg daily. Complete course of ceftriaxone. Continue Anusol twice daily. MiraLAX 17 g daily. Continue PPI twice daily. Monitor CMP and INR daily. Continue to daily weights.     LOS: 3 days    06/08/2021, 8:22 AM   KAliene Altes PPhysicians Eye Surgery CenterGastroenterology

## 2021-06-08 NOTE — Progress Notes (Addendum)
PROGRESS NOTE    Victoria Gordon  [id number] DOB: 02-17-1981 DOA: 06/05/2021 PCP: Patient, [name]    Chief Complaint  Patient presents with   Abdominal Pain    Brief admission narrative:  Victoria Gordon  is a 40 y.o. female female with longstanding history of alcohol abuse, who unfortunately recently relapsed and has been drinking again, as well as history of alcoholic liver cirrhosis with portal hypertension, ascites and chronic thrombocytopenia and elevated INR presents with concerns about bright red blood per rectum and melena, patient also has hemorrhoids -Patient endorses abdominal pain,.  Some nausea but no emesis No fever  Or chills -No productive cough  -In the ED UA suspicious for UTI -Lipase is 43, bilirubin is 6.2 potassium is 3.5 -Hemoglobin is down to 8.5, repeat hemoglobin is 7.8--baseline usually above 9  AST to ALT ratio is more than 2:1 -No chest pains no palpitations no dizziness CT abdomen and pelvis shows-IMPRESSION: 1. Interval development of cirrhosis and portal venous hypertension. 2. Liver is heterogeneous in appearance without discrete focal mass. 3. Splenomegaly. 4. 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    Assessment & Plan: 1-acute GI bleeding/acute blood loss anemia. -With concerns for esophageal varices versus lower GI bleed given prior history of hemorrhoidectomy. -Patient with ongoing alcoholic cirrhosis and portal hypertension. -Positive ascites -Continue Rocephin -Continue to follow hemoglobin trend and further transfuse as needed -Patient is status post 2 units of fresh frozen plasma and 1 unit PRBC;  -Transfusion threshold hemoglobin less than 7.5, in the setting of acute bleeding. -Hemoglobin has remained stable; no operative bleeding currently; Hgb 8.6 -Gastroenterology service on board and will follow recommendations. -Continue IV PPI.  2-alcoholic cirrhosis/ascites/hepatitis -Ultrasound  paracentesis planned for later today 06/07/2021 -Follow culture result -Empirically covering for SBP with Rocephin. -Continue current diuretic dose -Continue with propanolol at current dose. -Continue treatment with prednisolone as dictated by GI service.  3-Alcohol use disorder, severe, dependence ([place]) -Prior history of withdrawal in the past;  -Continue CIWA protocol -Cessation counseling provided -Continue thiamine and folic acid.  4-Thrombocytopenia ([place]) -In the setting of cirrhosis -Avoid heparin products -SCDs for DVT prophylaxis.    5-possible UTI -Daily urinalysis -Culture pending -Currently covered by the use of Rocephin. -Patient denies dysuria.  6-COVID-19 infection -Patient positive for COVID; not tested at time of admission. -No shortness of breath or need for oxygen supplementation -Will treat with one-time dose of monoclonal antibody infusion. -Continue isolation.  7-class II obesity -Body mass index is 36.03 kg/m. -Low calorie diet, portion control and increase physical activity discussed with patient.  DVT prophylaxis: SCDs Code Status: Full code Family Communication: No family at bedside. Disposition:   Status is: Inpatient  Remains inpatient appropriate because:IV treatments appropriate due to intensity of illness or inability to take PO  Dispo: The patient is from: Home              Anticipated d/c is to: Home              Patient currently is not medically stable to d/c.   Difficult to place patient No    Consultants:  Gastroenterology service  Procedures:  See below for x-ray report Paracentesis (planned for 06/07/2021)  Antimicrobials:  Rocephin.   Subjective:  No fever, no chest pain, no nausea, no vomiting.  Still complaining of intermittent abdominal discomfort.  No overt bleeding reported overnight.  Patient denies shortness of breath and demonstrate good oxygen saturation on room air.  Positive  icterus appreciated on  exam.  Objective: Vitals:   06/07/21 2058 06/07/21 2207 06/08/21 0445 06/08/21 1359  BP: (!) 93/55 106/71 90/68 104/70  Pulse: 71 68 (!) 59 62  Resp: '20  20 18  '$ Temp: 98.3 F (36.8 C)  98.2 F (36.8 C) 98.6 F (37 C)  TempSrc: Oral  Oral Oral  SpO2: 99%  98% 98%  Weight:   86.5 kg   Height:        Intake/Output Summary (Last 24 hours) at 06/08/2021 1753 Last data filed at 06/08/2021 1600 Gross per 24 hour  Intake 388.45 ml  Output --  Net 388.45 ml   Filed Weights   06/05/21 1501 06/08/21 0445  Weight: 91.3 kg 86.5 kg    Examination: General exam: No overt bleeding reported; no chest pain, no nausea, no vomiting.  Still complaining of intermittent abdominal discomfort.  Denies chest pain or shortness of breath.  Alert, awake, oriented x 3 Respiratory system: Clear to auscultation. Respiratory effort normal.  No using accessory muscle.  Good saturation on room air. Cardiovascular system:RRR. No murmurs, rubs, gallops. Gastrointestinal system: Abdomen is mildly distended; no guarding, no tenderness, positive bowel sounds.  Patient reports tenderness on deep palpation. Central nervous system: Alert and oriented. No focal neurological deficits. Extremities: No cyanosis or clubbing. Skin: No petechiae. Psychiatry: Judgement and insight appear normal. Mood & affect appropriate.    Data Reviewed: I have personally reviewed following labs and imaging studies  CBC: Recent Labs  Lab 06/05/21 1514 06/05/21 2119 06/06/21 0602 06/07/21 0201 06/08/21 0403  WBC 6.2  --  4.4 4.9 4.6  HGB 8.5* 7.8* 8.6* 8.4* 8.6*  HCT 27.7* 25.9* 27.8* 27.8* 28.2*  MCV 80.3  --  81.5 83.0 82.5  PLT 79*  --  65* 62* 65*    Basic Metabolic Panel: Recent Labs  Lab 06/05/21 1514 06/06/21 0602 06/07/21 0201 06/08/21 0403  NA 134* 137 135 133*  K 3.5 3.3* 3.8 4.3  CL 104 104 106 107  CO2 '24 27 26 23  '$ GLUCOSE 94 80 91 140*  BUN 5* 5* 6 <5*  CREATININE 0.34* <0.30* 0.35* 0.36*  CALCIUM 7.0*  7.3* 7.4* 7.7*  MG  --  1.6* 2.0  --   PHOS  --  3.6  --   --     GFR: Estimated Creatinine Clearance: 95.3 mL/min (A) (by C-G formula based on SCr of 0.36 mg/dL (L)).  Liver Function Tests: Recent Labs  Lab 06/05/21 1514 06/06/21 0602 06/07/21 0201 06/08/21 0403  AST 176* 158* 123* 112*  ALT '26 29 22 21  '$ ALKPHOS 164* 150* 134* 127*  BILITOT 6.2* 6.8* 6.8* 5.7*  PROT 5.9* 5.9* 5.4* 5.4*  ALBUMIN 2.1* 2.3* 2.3* 2.5*    CBG: Recent Labs  Lab 06/07/21 1821 06/08/21 0005 06/08/21 0629 06/08/21 1105 06/08/21 1636  GLUCAP 114* 154* 124* 114* 160*     Recent Results (from the past 240 hour(s))  Urine Culture     Status: Abnormal   Collection Time: 06/05/21 10:43 PM   Specimen: Urine, Clean Catch  Result Value Ref Range Status   Specimen Description   Final    URINE, CLEAN CATCH Performed at [facility], [address], [place], [place] [address]    Special Requests   Final    NONE Performed at [facility], [address] [place], [place] [address]    Culture MULTIPLE SPECIES PRESENT, [place] (A)  Final   Report Status 06/07/2021 FINAL  Final  Culture, body fluid w Gram Stain-bottle     Status: None (Preliminary result)   Collection Time: 06/07/21 11:35 AM   Specimen: Peritoneal Washings  Result Value Ref Range Status   Specimen Description PERITONEAL  Final   Special Requests BOTTLES DRAWN AEROBIC AND ANAEROBIC 10CC  Final   Culture   Final    NO GROWTH 1 DAY Performed at [facility], [address], [place], [place] [address]    Report Status PENDING  Incomplete  Gram stain     Status: None   Collection Time: 06/07/21 11:35 AM   Specimen: Peritoneal Washings  Result Value Ref Range Status   Specimen Description PERITONEAL  Final   Special Requests PERITONEAL  Final   Gram Stain   Final    ABUNDANT WBC PRESENT, PREDOMINANTLY MONONUCLEAR NO ORGANISMS SEEN Performed at [facility], [address], [place], [place] [address]    Report Status  06/07/2021 FINAL  Final  Resp Panel by RT-PCR (Flu A&B, Covid) Nasopharyngeal Swab     Status: Abnormal   Collection Time: 06/08/21 11:30 AM   Specimen: Nasopharyngeal Swab; Nasopharyngeal(NP) swabs in vial transport medium  Result Value Ref Range Status   SARS Coronavirus 2 by RT PCR POSITIVE (A) NEGATIVE Final    Comment: CRITICAL RESULT CALLED TO, READ BACK BY AND VERIFIED WITH: SMITH, D AT 1300 ON 8.9.22 BY RUCINSKI,B (NOTE) SARS-CoV-2 target nucleic acids are DETECTED.  The SARS-CoV-2 RNA is generally detectable in upper respiratory specimens during the acute phase of infection. Positive results are indicative of the presence of the identified virus, but do not rule out bacterial infection or co-infection with other pathogens not detected by the test. Clinical correlation with patient history and other diagnostic information is necessary to determine patient infection status. The expected result is Negative.  Fact Sheet for Patients: EntrepreneurPulse.com.au  Fact Sheet for Healthcare Providers: IncredibleEmployment.be  This test is not yet approved or cleared by the [place] FDA and  has been authorized for detection and/or diagnosis of SARS-CoV-2 by FDA under an Emergency Use Authorization (EUA).  This EUA will remain in effect (meaning  this test can be used) for the duration of  the COVID-19 declaration under Section 564(b)(1) of the Act, 21 U.S.C. section 360bbb-3(b)(1), unless the authorization is terminated or revoked sooner.     Influenza A by PCR NEGATIVE NEGATIVE Final   Influenza B by PCR NEGATIVE NEGATIVE Final    Comment: (NOTE) The Xpert Xpress SARS-CoV-2/FLU/RSV plus assay is intended as an aid in the diagnosis of influenza from Nasopharyngeal swab specimens and should not be used as a sole basis for treatment. Nasal washings and aspirates are unacceptable for Xpert Xpress SARS-CoV-2/FLU/RSV testing.  Fact Sheet  for Patients: EntrepreneurPulse.com.au  Fact Sheet for Healthcare Providers: IncredibleEmployment.be  This test is not yet approved or cleared by the [place] FDA and has been authorized for detection and/or diagnosis of SARS-CoV-2 by FDA under an Emergency Use Authorization (EUA). This EUA will remain in effect (meaning this test can be used) for the duration of the COVID-19 declaration under Section 564(b)(1) of the Act, 21 U.S.C. section 360bbb-3(b)(1), unless the authorization is terminated or revoked.  Performed at [facility], [address], [place], [place] [address]       Radiology Studies: US Paracentesis  Result Date: 06/07/2021 INDICATION: Cirrhotic liver by CT, ascites EXAM: ULTRASOUND GUIDED DIAGNOSTIC AND THERAPEUTIC PARACENTESIS MEDICATIONS: None COMPLICATIONS: None immediate PROCEDURE: Informed written consent was obtained from the patient after a  discussion of the risks, benefits and alternatives to treatment. A timeout was performed prior to the initiation of the procedure. Initial ultrasound scanning demonstrates a large amount of ascites within the LEFT lower abdominal quadrant. The right lower abdomen was prepped and draped in the usual sterile fashion. 1% lidocaine was used for local anesthesia. Following this, a 5 [place] Yueh catheter was introduced. An ultrasound image was saved for documentation purposes. The paracentesis was performed. The catheter was removed and a dressing was applied. The patient tolerated the procedure well without immediate post procedural complication. Patient received post-procedure intravenous albumin; see nursing notes for details. FINDINGS: A total of approximately 1.9 L of cloudy yellow ascitic fluid was removed. Samples were sent to the laboratory as requested by the clinical team. IMPRESSION: Successful ultrasound-guided paracentesis yielding 1.9 L liters of peritoneal fluid. Electronically Signed    By: Lavonia Dana M.D.   On: 06/07/2021 12:23     Scheduled Meds:  feeding supplement  237 mL Oral BID BM   folic acid  1 mg Oral Daily   furosemide  20 mg Oral Daily   hydrocortisone   Rectal BID   multivitamin with minerals  1 tablet Oral Daily   pantoprazole (PROTONIX) IV  40 mg Intravenous Q12H   polyethylene glycol  17 g Oral Daily   prednisoLONE  40 mg Oral Daily   propranolol  20 mg Oral BID   sodium chloride flush  3 mL Intravenous Q12H   sodium chloride flush  3 mL Intravenous Q12H   sodium chloride flush  3 mL Intravenous Q12H   spironolactone  25 mg Oral BID   [START ON 06/09/2021] spironolactone  50 mg Oral Daily   thiamine  100 mg Oral Daily   Or   thiamine  100 mg Intravenous Daily   Continuous Infusions:  sodium chloride     sodium chloride     albumin human 25 g (06/08/21 1543)   cefTRIAXone (ROCEPHIN)  IV 1 g (06/08/21 1700)     LOS: 3 days    Time spent: 35 minutes.   Barton Dubois, MD [facility]   To contact the attending provider between 7A-7P or the covering provider during after hours 7P-7A, please log into the web site [email or web address] and access using universal [place] password for that web site. If you do not have the password, please call the hospital operator.  06/08/2021, 5:53 PM

## 2021-06-09 ENCOUNTER — Inpatient Hospital Stay (HOSPITAL_COMMUNITY): Payer: Medicaid Other

## 2021-06-09 DIAGNOSIS — K7031 Alcoholic cirrhosis of liver with ascites: Secondary | ICD-10-CM

## 2021-06-09 DIAGNOSIS — K922 Gastrointestinal hemorrhage, unspecified: Secondary | ICD-10-CM

## 2021-06-09 DIAGNOSIS — K7030 Alcoholic cirrhosis of liver without ascites: Secondary | ICD-10-CM

## 2021-06-09 DIAGNOSIS — D696 Thrombocytopenia, unspecified: Secondary | ICD-10-CM

## 2021-06-09 LAB — GLUCOSE, CAPILLARY
Glucose-Capillary: 151 mg/dL — ABNORMAL HIGH (ref 70–99)
Glucose-Capillary: 122 mg/dL — ABNORMAL HIGH (ref 70–99)
Glucose-Capillary: 112 mg/dL — ABNORMAL HIGH (ref 70–99)

## 2021-06-09 LAB — CBC
HCT: 26.4 % — ABNORMAL LOW (ref 36.0–46.0)
Hemoglobin: 8 g/dL — ABNORMAL LOW (ref 12.0–15.0)
MCH: 25.2 pg — ABNORMAL LOW (ref 26.0–34.0)
MCHC: 30.3 g/dL (ref 30.0–36.0)
MCV: 83 fL (ref 80.0–100.0)
Platelets: 68 10*3/uL — ABNORMAL LOW (ref 150–400)
RBC: 3.18 MIL/uL — ABNORMAL LOW (ref 3.87–5.11)
RDW: 23.3 % — ABNORMAL HIGH (ref 11.5–15.5)
WBC: 6.2 10*3/uL (ref 4.0–10.5)
nRBC: 0 % (ref 0.0–0.2)

## 2021-06-09 LAB — COMPREHENSIVE METABOLIC PANEL
ALT: 19 U/L (ref 0–44)
AST: 91 U/L — ABNORMAL HIGH (ref 15–41)
Albumin: 2.6 g/dL — ABNORMAL LOW (ref 3.5–5.0)
Alkaline Phosphatase: 127 U/L — ABNORMAL HIGH (ref 38–126)
Anion gap: 4 — ABNORMAL LOW (ref 5–15)
BUN: 7 mg/dL (ref 6–20)
CO2: 25 mmol/L (ref 22–32)
Calcium: 8 mg/dL — ABNORMAL LOW (ref 8.9–10.3)
Chloride: 108 mmol/L (ref 98–111)
Creatinine, Ser: 0.39 mg/dL — ABNORMAL LOW (ref 0.44–1.00)
GFR, Estimated: >60 mL/min (ref >60)
Glucose, Bld: 166 mg/dL — ABNORMAL HIGH (ref 70–99)
Potassium: 3.9 mmol/L (ref 3.5–5.1)
Sodium: 137 mmol/L (ref 135–145)
Total Bilirubin: 5 mg/dL — ABNORMAL HIGH (ref 0.3–1.2)
Total Protein: 5.5 g/dL — ABNORMAL LOW (ref 6.5–8.1)

## 2021-06-09 LAB — PROTIME-INR
INR: 2.8 — ABNORMAL HIGH (ref 0.8–1.2)
Prothrombin Time: 29.7 seconds — ABNORMAL HIGH (ref 11.4–15.2)

## 2021-06-09 LAB — CYTOLOGY - NON PAP

## 2021-06-09 MED ORDER — FOLIC ACID 1 MG PO TABS
1.0000 mg | ORAL_TABLET | Freq: Every day | ORAL | Status: DC
  Administered 2021-06-09 – 2021-06-11 (×3): 1 mg via ORAL
  Filled 2021-06-09 (×2): qty 1

## 2021-06-09 MED ORDER — METHYLPREDNISOLONE SODIUM SUCC 125 MG IJ SOLR
125.0000 mg | Freq: Once | INTRAMUSCULAR | Status: AC | PRN
Start: 2021-06-09 — End: 2021-06-10

## 2021-06-09 MED ORDER — EPINEPHRINE 0.3 MG/0.3ML IJ SOAJ: 0.3000 mg | Freq: Once | INTRAMUSCULAR | Status: AC | PRN

## 2021-06-09 MED ORDER — ALBUTEROL SULFATE HFA 108 (90 BASE) MCG/ACT IN AERS: 2.0000 | INHALATION_SPRAY | Freq: Four times a day (QID) | RESPIRATORY_TRACT | Status: DC | PRN

## 2021-06-09 MED ORDER — DIPHENHYDRAMINE HCL 50 MG/ML IJ SOLN: 50.0000 mg | Freq: Once | INTRAMUSCULAR | Status: AC | PRN

## 2021-06-09 MED ORDER — ALBUTEROL SULFATE HFA 108 (90 BASE) MCG/ACT IN AERS: 2.0000 | INHALATION_SPRAY | Freq: Once | RESPIRATORY_TRACT | Status: AC | PRN

## 2021-06-09 MED ORDER — INSULIN ASPART 100 UNIT/ML IJ SOLN
0.0000 [IU] | Freq: Three times a day (TID) | INTRAMUSCULAR | Status: DC
Start: 2021-06-09 — End: 2021-06-12
  Administered 2021-06-11: 1 [IU] via SUBCUTANEOUS

## 2021-06-09 MED ORDER — SODIUM CHLORIDE 0.9 % IV SOLN: INTRAVENOUS | Status: AC | PRN

## 2021-06-09 MED ORDER — GUAIFENESIN-DM 100-10 MG/5ML PO SYRP: 10.0000 mL | ORAL_SOLUTION | ORAL | Status: DC | PRN

## 2021-06-09 MED ORDER — FAMOTIDINE IN NACL 20-0.9 MG/50ML-% IV SOLN: 20.0000 mg | Freq: Once | INTRAVENOUS | Status: DC | PRN

## 2021-06-09 MED ORDER — BEBTELOVIMAB 175 MG/2 ML IV (EUA)
175.0000 mg | Freq: Once | INTRAMUSCULAR | Status: AC
  Administered 2021-06-09: 175 mg via INTRAVENOUS
  Filled 2021-06-09: qty 2

## 2021-06-09 MED ORDER — INSULIN ASPART 100 UNIT/ML IJ SOLN
0.0000 [IU] | Freq: Every day | INTRAMUSCULAR | Status: DC
Start: 2021-06-09 — End: 2021-06-12

## 2021-06-09 MED ORDER — PANTOPRAZOLE SODIUM 40 MG PO TBEC
40.0000 mg | DELAYED_RELEASE_TABLET | Freq: Every day | ORAL | Status: DC
  Administered 2021-06-10 – 2021-06-11 (×2): 40 mg via ORAL
  Filled 2021-06-09 (×2): qty 1

## 2021-06-09 MED ORDER — ASCORBIC ACID 500 MG PO TABS
500.0000 mg | ORAL_TABLET | Freq: Every day | ORAL | Status: DC
  Administered 2021-06-09 – 2021-06-11 (×3): 500 mg via ORAL
  Filled 2021-06-09 (×3): qty 1

## 2021-06-09 MED ORDER — ZINC SULFATE 220 (50 ZN) MG PO CAPS
220.0000 mg | ORAL_CAPSULE | Freq: Every day | ORAL | Status: DC
  Administered 2021-06-09 – 2021-06-11 (×3): 220 mg via ORAL
  Filled 2021-06-09 (×3): qty 1

## 2021-06-09 MED ORDER — HYDROCOD POLST-CPM POLST ER 10-8 MG/5ML PO SUER
5.0000 mL | Freq: Two times a day (BID) | ORAL | Status: DC | PRN
Start: 2021-06-09 — End: 2021-06-12

## 2021-06-09 MED ORDER — ALBUTEROL SULFATE HFA 108 (90 BASE) MCG/ACT IN AERS
2.0000 | INHALATION_SPRAY | Freq: Four times a day (QID) | RESPIRATORY_TRACT | Status: DC
  Administered 2021-06-09 (×3): 2 via RESPIRATORY_TRACT
  Filled 2021-06-09: qty 6.7

## 2021-06-09 MED ORDER — POLYETHYLENE GLYCOL 3350 17 G PO PACK
17.0000 g | PACK | Freq: Two times a day (BID) | ORAL | Status: DC
Start: 2021-06-09 — End: 2021-06-12
  Administered 2021-06-09 – 2021-06-11 (×3): 17 g via ORAL
  Filled 2021-06-09 (×2): qty 1

## 2021-06-09 NOTE — Progress Notes (Signed)
PROGRESS NOTE    Victoria Gordon  [id number] DOB: [birth year] DOA: 06/05/2021 PCP: Patient, [name]    Chief Complaint  Patient presents with   Abdominal Pain    Brief admission narrative:  Victoria Gordon  is a 40 y.o. female female with longstanding history of alcohol abuse, who unfortunately recently relapsed and has been drinking again, as well as history of alcoholic liver cirrhosis with portal hypertension, ascites and chronic thrombocytopenia and elevated INR presents with concerns about bright red blood per rectum and melena, patient also has hemorrhoids -Patient endorses abdominal pain,.  Some nausea but no emesis No fever  Or chills -No productive cough  -In the ED UA suspicious for UTI -Lipase is 43, bilirubin is 6.2 potassium is 3.5 -Hemoglobin is down to 8.5, repeat hemoglobin is 7.8--baseline usually above 9  AST to ALT ratio is more than 2:1 -No chest pains no palpitations no dizziness CT abdomen and pelvis shows-IMPRESSION: 1. Interval development of cirrhosis and portal venous hypertension. 2. Liver is heterogeneous in appearance without discrete focal mass. 3. Splenomegaly. 4. 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   Assessment & Plan: 1-acute GI bleeding/acute blood loss anemia. -With concerns for esophageal varices versus lower GI bleed given prior history of hemorrhoidectomy. -Patient with ongoing alcoholic cirrhosis and portal hypertension. -Positive ascites -Continue Rocephin for SBP prophylaxis in nonspecific patient with GI bleed -Continue to follow hemoglobin trend and further transfuse as needed -Patient is status post 2 units of fresh frozen plasma and 1 unit PRBC;  -Transfusion threshold hemoglobin less than 7.5, in the setting of acute bleeding. -Hemoglobin drifting down currently at 8.0  -Gastroenterology consult appreciated, GI team hoping to defer EGD until about 10 days post COVID infection if  able -Continue IV PPI.  2-alcoholic cirrhosis/ascites/hepatitis -Ultrasound paracentesis planned for later today 06/07/2021 -Follow culture result -Empirically covering for SBP with Rocephin. -Continue current diuretic dose -Continue with propanolol at current dose. -GI service recommends  prednisolone 40 mg every day (started 06/07/21) and calculate Lille score on day 7.   3-Alcohol use disorder, severe, dependence ([place]) -Prior history of withdrawal in the past--no evidence of DTs at this time -Continue benzo per CIWA protocol -Cessation counseling provided -Continue thiamine and folic acid.  4)Elevated INR and Thrombocytopenia ([facility]) -In the setting of cirrhosis -Avoid heparin products -SCDs for DVT prophylaxis. -INR is 2.8, platelets up to 68    5)COVID-19 infection -Admitted on 06/05/2021, COVID test Not done on admission -Tested positive for COVID-19 on 06/08/2021 -Largely asymptomatic -Monoclonal antibody ordered and given on 06/09/2021  7-class II obesity -Body mass index is 36.07 kg/m. -Low calorie diet, portion control and increase physical activity discussed with patient.  DVT prophylaxis: SCDs Code Status: Full code Family Communication: No family at bedside, patient will update family SNF Disposition:   Status is: Inpatient  Remains inpatient appropriate because: Concerns for ongoing GI bleed H&H drifting down  Dispo: The patient is from: Home              Anticipated d/c is to: Home              Patient currently is not medically stable to d/c.   Difficult to place patient No    Consultants:  Gastroenterology service  Procedures:  See below for x-ray report Paracentesis (planned for 06/07/2021)  Antimicrobials:  Rocephin.   Subjective:  -No BM, nausea but no emesis -No fever no chills abdominal pain persist  Objective: Vitals:  06/09/21 0500 06/09/21 0538 06/09/21 0847 06/09/21 0904  BP:  (!) 99/55  106/64  Pulse:  (!) 58  64  Resp:  20     Temp:  98.3 F (36.8 C)    TempSrc:      SpO2:  98% 100%   Weight: 86.6 kg     Height:        Intake/Output Summary (Last 24 hours) at 06/09/2021 1200 Last data filed at 06/09/2021 0900 Gross per 24 hour  Intake 288.45 ml  Output --  Net 288.45 ml   Filed Weights   06/05/21 1501 06/08/21 0445 06/09/21 0500  Weight: 91.3 kg 86.5 kg 86.6 kg    Examination:  Physical Exam  Gen:- Awake Alert, in no acute distress HEENT:- [place].AT,  +ve sclera icterus Neck-Supple Neck,No JVD,.  Lungs-  CTAB , fair air movement CV- S1, S2 normal, RRR Abd-  +ve B.Sounds, Abd Soft, diffusely tender, no rebound or guarding extremity/Skin:- No  edema, good pedal pulses Psych-affect is appropriate, oriented x3 Neuro-generalized weakness, no new focal deficits, no tremors  Data Reviewed: I have personally reviewed following labs and imaging studies  CBC: Recent Labs  Lab 06/05/21 1514 06/05/21 2119 06/06/21 0602 06/07/21 0201 06/08/21 0403 06/09/21 0513  WBC 6.2  --  4.4 4.9 4.6 6.2  HGB 8.5* 7.8* 8.6* 8.4* 8.6* 8.0*  HCT 27.7* 25.9* 27.8* 27.8* 28.2* 26.4*  MCV 80.3  --  81.5 83.0 82.5 83.0  PLT 79*  --  65* 62* 65* 68*    Basic Metabolic Panel: Recent Labs  Lab 06/05/21 1514 06/06/21 0602 06/07/21 0201 06/08/21 0403 06/09/21 0513  NA 134* 137 135 133* 137  K 3.5 3.3* 3.8 4.3 3.9  CL 104 104 106 107 108  CO2 '24 27 26 23 25  '$ GLUCOSE 94 80 91 140* 166*  BUN 5* 5* 6 <5* 7  CREATININE 0.34* <0.30* 0.35* 0.36* 0.39*  CALCIUM 7.0* 7.3* 7.4* 7.7* 8.0*  MG  --  1.6* 2.0  --   --   PHOS  --  3.6  --   --   --     GFR: Estimated Creatinine Clearance: 95.3 mL/min (A) (by C-G formula based on SCr of 0.39 mg/dL (L)).  Liver Function Tests: Recent Labs  Lab 06/05/21 1514 06/06/21 0602 06/07/21 0201 06/08/21 0403 06/09/21 0513  AST 176* 158* 123* 112* 91*  ALT '26 29 22 21 19  '$ ALKPHOS 164* 150* 134* 127* 127*  BILITOT 6.2* 6.8* 6.8* 5.7* 5.0*  PROT 5.9* 5.9* 5.4* 5.4* 5.5*   ALBUMIN 2.1* 2.3* 2.3* 2.5* 2.6*    CBG: Recent Labs  Lab 06/08/21 1105 06/08/21 1636 06/08/21 2350 06/09/21 0538 06/09/21 1102  GLUCAP 114* 160* 166* 151* 122*     Recent Results (from the past 240 hour(s))  Urine Culture     Status: Abnormal   Collection Time: 06/05/21 10:43 PM   Specimen: Urine, Clean Catch  Result Value Ref Range Status   Specimen Description   Final    URINE, CLEAN CATCH Performed at [facility], [address], [place], [place] [address]    Special Requests   Final    NONE Performed at [facility], [address], [place], [place] [address]    Culture MULTIPLE SPECIES PRESENT, SUGGEST RECOLLECTION (A)  Final   Report Status 06/07/2021 FINAL  Final  Culture, body fluid w Gram Stain-bottle     Status: None (Preliminary result)   Collection Time: 06/07/21 11:35 AM  Specimen: Peritoneal Washings  Result Value Ref Range Status   Specimen Description PERITONEAL  Final   Special Requests BOTTLES DRAWN AEROBIC AND ANAEROBIC 10CC  Final   Culture   Final    NO GROWTH 2 DAYS Performed at [facility], [address], [place], [place] [address]    Report Status PENDING  Incomplete  Gram stain     Status: None   Collection Time: 06/07/21 11:35 AM   Specimen: Peritoneal Washings  Result Value Ref Range Status   Specimen Description PERITONEAL  Final   Special Requests PERITONEAL  Final   Gram Stain   Final    ABUNDANT WBC PRESENT, PREDOMINANTLY MONONUCLEAR NO ORGANISMS SEEN Performed at [facility], [address], [place], [place] [address]    Report Status 06/07/2021 FINAL  Final  Resp Panel by RT-PCR (Flu A&B, Covid) Nasopharyngeal Swab     Status: Abnormal   Collection Time: 06/08/21 11:30 AM   Specimen: Nasopharyngeal Swab; Nasopharyngeal(NP) swabs in vial transport medium  Result Value Ref Range Status   SARS Coronavirus 2 by RT PCR POSITIVE (A) NEGATIVE Final    Comment: CRITICAL RESULT CALLED TO, READ BACK BY AND VERIFIED WITH: SMITH, D  AT 1300 ON 8.9.22 BY RUCINSKI,B (NOTE) SARS-CoV-2 target nucleic acids are DETECTED.  The SARS-CoV-2 RNA is generally detectable in upper respiratory specimens during the acute phase of infection. Positive results are indicative of the presence of the identified virus, but do not rule out bacterial infection or co-infection with other pathogens not detected by the test. Clinical correlation with patient history and other diagnostic information is necessary to determine patient infection status. The expected result is Negative.  Fact Sheet for Patients: EntrepreneurPulse.com.au  Fact Sheet for Healthcare Providers: IncredibleEmployment.be  This test is not yet approved or cleared by the [place] FDA and  has been authorized for detection and/or diagnosis of SARS-CoV-2 by FDA under an Emergency Use Authorization (EUA).  This EUA will remain in effect (meaning  this test can be used) for the duration of  the COVID-19 declaration under Section 564(b)(1) of the Act, 21 U.S.C. section 360bbb-3(b)(1), unless the authorization is terminated or revoked sooner.     Influenza A by PCR NEGATIVE NEGATIVE Final   Influenza B by PCR NEGATIVE NEGATIVE Final    Comment: (NOTE) The Xpert Xpress SARS-CoV-2/FLU/RSV plus assay is intended as an aid in the diagnosis of influenza from Nasopharyngeal swab specimens and should not be used as a sole basis for treatment. Nasal washings and aspirates are unacceptable for Xpert Xpress SARS-CoV-2/FLU/RSV testing.  Fact Sheet for Patients: EntrepreneurPulse.com.au  Fact Sheet for Healthcare Providers: IncredibleEmployment.be  This test is not yet approved or cleared by the [place] FDA and has been authorized for detection and/or diagnosis of SARS-CoV-2 by FDA under an Emergency Use Authorization (EUA). This EUA will remain in effect (meaning this test can be used) for the  duration of the COVID-19 declaration under Section 564(b)(1) of the Act, 21 U.S.C. section 360bbb-3(b)(1), unless the authorization is terminated or revoked.  Performed at [facility], [address] [place], [place] [address]       Radiology Studies: US Paracentesis  Result Date: 06/07/2021 INDICATION: Cirrhotic liver by CT, ascites EXAM: ULTRASOUND GUIDED DIAGNOSTIC AND THERAPEUTIC PARACENTESIS MEDICATIONS: None COMPLICATIONS: None immediate PROCEDURE: Informed written consent was obtained from the patient after a discussion of the risks, benefits and alternatives to treatment. A timeout was performed prior to the initiation of the procedure. Initial ultrasound scanning  demonstrates a large amount of ascites within the LEFT lower abdominal quadrant. The right lower abdomen was prepped and draped in the usual sterile fashion. 1% lidocaine was used for local anesthesia. Following this, a 5 [place] Yueh catheter was introduced. An ultrasound image was saved for documentation purposes. The paracentesis was performed. The catheter was removed and a dressing was applied. The patient tolerated the procedure well without immediate post procedural complication. Patient received post-procedure intravenous albumin; see nursing notes for details. FINDINGS: A total of approximately 1.9 L of cloudy yellow ascitic fluid was removed. Samples were sent to the laboratory as requested by the clinical team. IMPRESSION: Successful ultrasound-guided paracentesis yielding 1.9 L liters of peritoneal fluid. Electronically Signed   By: Lavonia Dana M.D.   On: 06/07/2021 12:23   DG CHEST PORT 1 VIEW  Result Date: 06/09/2021 CLINICAL DATA:  Tested positive for COVID-19 yesterday EXAM: PORTABLE CHEST 1 VIEW COMPARISON:  Portable exam [id number] hours compared to 10/07/2020 FINDINGS: Upper normal size of cardiac silhouette. Mediastinal contours and pulmonary vascularity normal. Lungs clear. No pulmonary infiltrate, pleural effusion,  or pneumothorax. Osseous structures unremarkable. IMPRESSION: No acute abnormalities. Electronically Signed   By: Lavonia Dana M.D.   On: 06/09/2021 11:33     Scheduled Meds:  albuterol  2 puff Inhalation Q6H   vitamin C  500 mg Oral Daily   feeding supplement  237 mL Oral BID BM   folic acid  1 mg Oral Daily   furosemide  20 mg Oral Daily   hydrocortisone   Rectal BID   insulin aspart  0-5 Units Subcutaneous QHS   insulin aspart  0-6 Units Subcutaneous TID WC   multivitamin with minerals  1 tablet Oral Daily   pantoprazole (PROTONIX) IV  40 mg Intravenous Q12H   polyethylene glycol  17 g Oral Daily   prednisoLONE  40 mg Oral Daily   propranolol  20 mg Oral BID   sodium chloride flush  3 mL Intravenous Q12H   sodium chloride flush  3 mL Intravenous Q12H   sodium chloride flush  3 mL Intravenous Q12H   spironolactone  50 mg Oral Daily   thiamine  100 mg Oral Daily   Or   thiamine  100 mg Intravenous Daily   zinc sulfate  220 mg Oral Daily   Continuous Infusions:  sodium chloride     sodium chloride     cefTRIAXone (ROCEPHIN)  IV Stopped (06/08/21 1731)    LOS: 4 days   Roxan Hockey, MD [facility]   To contact the attending provider between 7A-7P or the covering provider during after hours 7P-7A, please log into the web site [email or web address] and access using universal [place] password for that web site. If you do not have the password, please call the hospital operator.  06/09/2021, 12:00 PM

## 2021-06-09 NOTE — Progress Notes (Signed)
Subjective: Patient reports continued abdominal pain, but feels this is related to constipation as she has not had a BM since Sunday. Reports issues with constipation at baseline. She does not take anything for this. Denies nausea or vomiting.   Objective: Vital signs in last 24 hours: Temp:  [98 F (36.7 C)-98.6 F (37 C)] 98.3 F (36.8 C) (08/10 0538) Pulse Rate:  [58-67] 64 (08/10 0904) Resp:  [18-20] 20 (08/10 0538) BP: (99-107)/(55-70) 106/64 (08/10 0904) SpO2:  [98 %-100 %] 100 % (08/10 0847) Weight:  [86.6 kg] 86.6 kg (08/10 0500) Last BM Date: 06/05/21 General:   Alert and oriented, pleasant Head:  Normocephalic and atraumatic. Eyes:  icterus present Heart:  S1, S2 present, no murmurs noted.  Lungs: Clear to auscultation bilaterally, without wheezing, rales, or rhonchi.  Abdomen:  Bowel sounds present, soft, non-tender, non-distended. No HSM or hernias noted. No rebound or guarding. No masses appreciated very  mild ascites. Msk:  Symmetrical without gross deformities. Normal posture. Pulses:  Normal pulses noted. Extremities:  Without clubbing or edema. Neurologic:  Alert and  oriented x4;  grossly normal neurologically. Skin:  Warm and dry, intact without significant lesions.  Jaundice present  Psych:  Alert and cooperative. Normal mood and affect.  Intake/Output from previous day: 08/09 0701 - 08/10 0700 In: 188.5 [IV Piggyback:188.5] Out: -  Intake/Output this shift: Total I/O In: 100 [IV Piggyback:100] Out: -   Lab Results: Recent Labs    06/07/21 0201 06/08/21 0403 06/09/21 0513  WBC 4.9 4.6 6.2  HGB 8.4* 8.6* 8.0*  HCT 27.8* 28.2* 26.4*  PLT 62* 65* 68*   BMET Recent Labs    06/07/21 0201 06/08/21 0403 06/09/21 0513  NA 135 133* 137  K 3.8 4.3 3.9  CL 106 107 108  CO2 '26 23 25  ' GLUCOSE 91 140* 166*  BUN 6 <5* 7  CREATININE 0.35* 0.36* 0.39*  CALCIUM 7.4* 7.7* 8.0*   LFT Recent Labs    06/07/21 0201 06/08/21 0403 06/09/21 0513   PROT 5.4* 5.4* 5.5*  ALBUMIN 2.3* 2.5* 2.6*  AST 123* 112* 91*  ALT '22 21 19  ' ALKPHOS 134* 127* 127*  BILITOT 6.8* 5.7* 5.0*   PT/INR Recent Labs    06/08/21 0403 06/09/21 0513  LABPROT 27.6* 29.7*  INR 2.6* 2.8*   Hepatitis Panel Recent Labs    06/06/21 1406  HEPBSAG NON REACTIVE  HCVAB NON REACTIVE     Studies/Results: US Paracentesis  Result Date: 06/07/2021 INDICATION: Cirrhotic liver by CT, ascites EXAM: ULTRASOUND GUIDED DIAGNOSTIC AND THERAPEUTIC PARACENTESIS MEDICATIONS: None COMPLICATIONS: None immediate PROCEDURE: Informed written consent was obtained from the patient after a discussion of the risks, benefits and alternatives to treatment. A timeout was performed prior to the initiation of the procedure. Initial ultrasound scanning demonstrates a large amount of ascites within the LEFT lower abdominal quadrant. The right lower abdomen was prepped and draped in the usual sterile fashion. 1% lidocaine was used for local anesthesia. Following this, a 5 [place] Yueh catheter was introduced. An ultrasound image was saved for documentation purposes. The paracentesis was performed. The catheter was removed and a dressing was applied. The patient tolerated the procedure well without immediate post procedural complication. Patient received post-procedure intravenous albumin; see nursing notes for details. FINDINGS: A total of approximately 1.9 L of cloudy yellow ascitic fluid was removed. Samples were sent to the laboratory as requested by the clinical team. IMPRESSION: Successful ultrasound-guided paracentesis yielding 1.9 L liters of peritoneal fluid. Electronically  Signed   By: Lavonia Dana M.D.   On: 06/07/2021 12:23    Assessment: Victoria Gordon is a 40 year old female with history of alcoholic cirrhosis who presented with decompensated disease, including rectal bleeding, abdominal pain and alcoholic hepatitis. Prior history of SBP in 2018 at [facility], prior decompensated liver disease,  rectal bleeding due to hemorrhoids.  Cirrhosis: MELD Na 27, child pugh score 12, class C: . Hep B surface antigen and Hep C antibody negative. Stage 2 of 4 fibrosis and steatohepatitis on liver biopsy in 2018. Empiric antibiotics ongoing. Paracentesis Monday yieded 1.9L, cell count for SBP. Gram stain negative. Culture negative. Mild ascites today, but abdomen is soft. States that she stopped drinking 2 weeks ago but recently restarted. Typically drinks two 24 oz beers on the weekend, she denies liquor intake currently.   Rectal bleeding: last noticed BRBPR on Sunday, in the toilet, and melena last Thursday, suspect from hemorrhoids, however, Hgb 8 this morning, down from 8.6 yesterday. Will continue to monitor, should transfuse as needed per hospitalist. Was scheduled to undergo EGD yesterday but covid test came back positive. Will plan for outpatient EGD as no active GI bleeding present at this time.   Alcoholic hepatitis: DF 81.7-71.1, up from 56.9-75.3 Monday. Started on prednisolone 38m daily on 8/8. Will continue this with plans to calculate lilly score on 8/15 to determine if prednisolone should be continued. Clinically she remains stable. ALT WNL, AST trending down, 91 today (176 on admission), Alk phos also trending down, 127 (164 on admission), total bili 5, (6.2 on admission), INR 2.8, trending up, 2.3 on admission.   Abd pain: diffuse, mild today. Reports worse postprandially with some postprandial nausea. Lipase 43 on admission, CT without evidence of pancreatitis, Para negative for SBP, 1.9 L taken off on 8/7. Abdomen is soft, non tender and with very mild acsites, patient reports no BM since Sunday, abdominal pain likely related to constipation. Will increase miralax to BID.   Coagulopathy: INR 2.8 today, trending up (2.3 on admission). No overt GI bleeding, will continue to trend, patient is allergic to Vitamin K  Denies shortness of breath. Patient appears A/O without obvious signs of  HE.  Plan: Continue prednisolone 455mdaily Continue spironolactone Continue low dose oral lasix 2081maily Continue anusol BID Will increase miralax to BID Continue PPI BID Monitor CMP and INR daily Continue daily weights Continue propranolol Continue to monitor mental status changes for possible HE   LOS: 4 days    06/09/2021, 9:14 AM  Loria Lacina L. CarAlver SorrowSN, APRN, AGNP-C Adult-Gerontology Nurse Practitioner [facility]

## 2021-06-10 DIAGNOSIS — K625 Hemorrhage of anus and rectum: Secondary | ICD-10-CM

## 2021-06-10 DIAGNOSIS — K7011 Alcoholic hepatitis with ascites: Secondary | ICD-10-CM

## 2021-06-10 DIAGNOSIS — D649 Anemia, unspecified: Secondary | ICD-10-CM

## 2021-06-10 LAB — CBC WITH DIFFERENTIAL/PLATELET
Abs Immature Granulocytes: 0.01 10*3/uL (ref 0.00–0.07)
Basophils Absolute: 0 10*3/uL (ref 0.0–0.1)
Basophils Relative: 1 %
Eosinophils Absolute: 0.1 10*3/uL (ref 0.0–0.5)
Eosinophils Relative: 2 %
HCT: 25.9 % — ABNORMAL LOW (ref 36.0–46.0)
Hemoglobin: 7.7 g/dL — ABNORMAL LOW (ref 12.0–15.0)
Immature Granulocytes: 0 %
Lymphocytes Relative: 24 %
Lymphs Abs: 1 10*3/uL (ref 0.7–4.0)
MCH: 24.9 pg — ABNORMAL LOW (ref 26.0–34.0)
MCHC: 29.7 g/dL — ABNORMAL LOW (ref 30.0–36.0)
MCV: 83.8 fL (ref 80.0–100.0)
Monocytes Absolute: 0.4 10*3/uL (ref 0.1–1.0)
Monocytes Relative: 9 %
Neutro Abs: 2.8 10*3/uL (ref 1.7–7.7)
Neutrophils Relative %: 64 %
Platelets: 61 10*3/uL — ABNORMAL LOW (ref 150–400)
RBC: 3.09 MIL/uL — ABNORMAL LOW (ref 3.87–5.11)
RDW: 24.1 % — ABNORMAL HIGH (ref 11.5–15.5)
WBC: 4.3 10*3/uL (ref 4.0–10.5)
nRBC: 0 % (ref 0.0–0.2)

## 2021-06-10 LAB — COMPREHENSIVE METABOLIC PANEL
ALT: 25 U/L (ref 0–44)
AST: 126 U/L — ABNORMAL HIGH (ref 15–41)
Albumin: 2.5 g/dL — ABNORMAL LOW (ref 3.5–5.0)
Alkaline Phosphatase: 123 U/L (ref 38–126)
Anion gap: 2 — ABNORMAL LOW (ref 5–15)
BUN: 8 mg/dL (ref 6–20)
CO2: 24 mmol/L (ref 22–32)
Calcium: 7.5 mg/dL — ABNORMAL LOW (ref 8.9–10.3)
Chloride: 109 mmol/L (ref 98–111)
Creatinine, Ser: 0.35 mg/dL — ABNORMAL LOW (ref 0.44–1.00)
GFR, Estimated: >60 mL/min (ref >60)
Glucose, Bld: 91 mg/dL (ref 70–99)
Potassium: 3.6 mmol/L (ref 3.5–5.1)
Sodium: 135 mmol/L (ref 135–145)
Total Bilirubin: 5.1 mg/dL — ABNORMAL HIGH (ref 0.3–1.2)
Total Protein: 5.2 g/dL — ABNORMAL LOW (ref 6.5–8.1)

## 2021-06-10 LAB — GLUCOSE, CAPILLARY
Glucose-Capillary: 100 mg/dL — ABNORMAL HIGH (ref 70–99)
Glucose-Capillary: 87 mg/dL (ref 70–99)
Glucose-Capillary: 107 mg/dL — ABNORMAL HIGH (ref 70–99)
Glucose-Capillary: 150 mg/dL — ABNORMAL HIGH (ref 70–99)
Glucose-Capillary: 180 mg/dL — ABNORMAL HIGH (ref 70–99)

## 2021-06-10 LAB — C-REACTIVE PROTEIN: CRP: 1.5 mg/dL — ABNORMAL HIGH (ref <1.0)

## 2021-06-10 LAB — FERRITIN: Ferritin: 13 ng/mL (ref 11–307)

## 2021-06-10 LAB — MAGNESIUM: Magnesium: 1.7 mg/dL (ref 1.7–2.4)

## 2021-06-10 LAB — D-DIMER, QUANTITATIVE: D-Dimer, Quant: 9.93 ug/mL-FEU — ABNORMAL HIGH (ref 0.00–0.50)

## 2021-06-10 LAB — PHOSPHORUS: Phosphorus: 3.2 mg/dL (ref 2.5–4.6)

## 2021-06-10 LAB — PROTIME-INR
INR: 2.6 — ABNORMAL HIGH (ref 0.8–1.2)
Prothrombin Time: 27.7 seconds — ABNORMAL HIGH (ref 11.4–15.2)

## 2021-06-10 NOTE — Progress Notes (Signed)
Subjective: Feeling well. Denies abdominal pain, nausea, vomiting, fever, chills, shortness of breath, cough, cold or flulike symptoms, headache.  Denies abdominal distention.  Tolerating her diet well.  Reports she did have a bowel movement this morning with red blood in the stool and toilet water.  This was the first episode of rectal bleeding since Sunday. Similar to outpatient bleeding.  Denies passing a hard stool or straining.  No melena.  Objective: Vital signs in last 24 hours: Temp:  [98.1 F (36.7 C)-98.6 F (37 C)] 98.6 F (37 C) (08/11 1259) Pulse Rate:  [65-72] 65 (08/11 1259) Resp:  [17-20] 17 (08/11 1259) BP: (103-107)/(59-71) 107/69 (08/11 1259) SpO2:  [98 %-100 %] 100 % (08/11 1259) Weight:  [86.7 kg] 86.7 kg (08/11 0500) Last BM Date: 06/09/21 General:   Alert and oriented, pleasant Head:  Normocephalic and atraumatic. Eyes:  No icterus, sclera clear. Conjuctiva pink.  Abdomen:  Bowel sounds present, soft, non-tender, non-distended. No HSM or hernias noted. No rebound or guarding. No masses appreciated  Msk:  Symmetrical without gross deformities. Normal posture. Extremities:  Without edema. Neurologic:  Alert and  oriented x4;  grossly normal neurologically. Skin:  Warm and dry, intact without significant lesions.  Psych:  Normal mood and affect.  Intake/Output from previous day: 08/10 0701 - 08/11 0700 In: 340 [P.O.:240; IV Piggyback:100] Out: -  Intake/Output this shift: Total I/O In: 240 [P.O.:240] Out: -   Lab Results: Recent Labs    06/08/21 0403 06/09/21 0513 06/10/21 0547  WBC 4.6 6.2 4.3  HGB 8.6* 8.0* 7.7*  HCT 28.2* 26.4* 25.9*  PLT 65* 68* 61*   BMET Recent Labs    06/08/21 0403 06/09/21 0513 06/10/21 0547  NA 133* 137 135  K 4.3 3.9 3.6  CL 107 108 109  CO2 '23 25 24  '$ GLUCOSE 140* 166* 91  BUN <5* 7 8  CREATININE 0.36* 0.39* 0.35*  CALCIUM 7.7* 8.0* 7.5*   LFT Recent Labs    06/08/21 0403 06/09/21 0513 06/10/21 0547   PROT 5.4* 5.5* 5.2*  ALBUMIN 2.5* 2.6* 2.5*  AST 112* 91* 126*  ALT '21 19 25  '$ ALKPHOS 127* 127* 123  BILITOT 5.7* 5.0* 5.1*   PT/INR Recent Labs    06/09/21 0513 06/10/21 0547  LABPROT 29.7* 27.7*  INR 2.8* 2.6*    Studies/Results: DG CHEST PORT 1 VIEW  Result Date: 06/09/2021 CLINICAL DATA:  Tested positive for COVID-19 yesterday EXAM: PORTABLE CHEST 1 VIEW COMPARISON:  Portable exam [id number] hours compared to 10/07/2020 FINDINGS: Upper normal size of cardiac silhouette. Mediastinal contours and pulmonary vascularity normal. Lungs clear. No pulmonary infiltrate, pleural effusion, or pneumothorax. Osseous structures unremarkable. IMPRESSION: No acute abnormalities. Electronically Signed   By: Lavonia Dana M.D.   On: 06/09/2021 11:33    Assessment: 40 year old female with history of cirrhosis likely due to alcohol, presenting with decompensated disease, rectal bleeding, abdominal pain, and alcoholic hepatitis. Prior history significant for SBP in 2018 at [facility], prior decompensated liver disease, rectal bleeding due to hemorrhoids.  Alcoholic hepatitis: DF [id number], stable compared to yesterday, but up from 56.9-75.3 Monday. She was started on prednisolone 40 mg daily on 8/8.  We will continue this with plans to calculate Lilly score on 8/15 to determine if prednisolone should be continued.  Clinically, she remained stable.  T bili fairly stable at 5.1 today from 5.0 yesterday, max 6.8 on 8/8.  INR has finally started to stabilize/improve, at 2.6 today, down from max 2.8  yesterday.   Alcoholic cirrhosis: MELD Na 25 today (down from 27 yesterday).  Hepatitis B and C negative.  Liver biopsy in 2018 with steatohepatitis and fibrosis stage 2 of 4.  CT this admission with evidence of portal venous hypertension, large esophageal and LUQ varices. Last EGD 2018 with portal hypertensive gastropathy. Started on empiric antibiotics this admission.    Paracentesis 8/8 yielding 1.9 L, cell count  negative for SBP, gram stain negative, culture negative.  Lower extremity edema improved with diuretics.  Currently on spironolactone 50 mg daily and Lasix 20 mg daily.  Down 10 pounds this admission.  No signs of HE. We had planned on EGD this admission, but due to incidental COVID-19 diagnosis, this has been postponed as she was not having any active GI bleeding.  Rectal bleeding: Likely benign source. Prior hemorrhoidectomy in March 2018 and sclerotherapy with sutures of internal hemorrhoids June 2018 at [facility].  Patient reports return of rectal bleeding this morning with a bowel movement.  This was the first time since Sunday.  Denies passing hard stool or straining.  Currently on MiraLAX BID for constipation.  Hemoglobin has been slowly declining over the last 2 days.  8.6 on 8/9, 8.0 yesterday, and 7.7 this morning.  We will need to continue to monitor this closely.  May need to consider colonoscopy this admission if rectal bleeding continues or she develops transfusion dependent anemia.  Abdominal pain: Reported diffuse abdominal pain worsened postprandially on admission.  Now resolved.  Reports only having mild abdominal cramping prior to a bowel movement that improves thereafter.  No acute findings on CT this admission.  Para was negative for SBP.  Abdominal exam benign today.  Coagulopathy: INR 2.6 today, down from 2.8 yesterday. Reported episode of rectal bleeding this morning, similar to episodes prior to admission. We will continue to monitor for further overt GI bleeding and trend INR. Notably, she is allergic to vitamin K.   Plan: Continue to monitor H&H and for overt GI bleeding.  Transfuse as necessary. If ongoing rectal bleeding or transfusion dependent anemia, may need to consider inpatient colonoscopy. Continue prednisolone 40 mg daily.  Calculate Lilly score on 8/15. Continue propranolol. Continue spironolactone 50 mg daily and Lasix 20 mg daily. Complete 5 day course of  ceftriaxone.  Continue Anusol twice daily. Continue MiraLAX 17 g BID. Continue PPI twice daily. Continue to monitor CMP and INR daily. Monitor for mental status changes for possible HE.    LOS: 5 days    06/10/2021, 1:41 PM   Aliene Altes, [facility]

## 2021-06-10 NOTE — Progress Notes (Signed)
PROGRESS NOTE    [name]  [id number] DOB: [birth year] DOA: 06/05/2021 PCP: Patient, [name]    Chief Complaint  Patient presents with   Abdominal Pain    Brief admission narrative:  Victoria Gordon  is a 40 y.o. female female with longstanding history of alcohol abuse, who unfortunately recently relapsed and has been drinking again, as well as history of alcoholic liver cirrhosis with portal hypertension, ascites and chronic thrombocytopenia and elevated INR presents with concerns about bright red blood per rectum and melena, patient also has hemorrhoids -Patient endorses abdominal pain,.  Some nausea but no emesis No fever  Or chills -No productive cough  -In the ED UA suspicious for UTI -Lipase is 43, bilirubin is 6.2 potassium is 3.5 -Hemoglobin is down to 8.5, repeat hemoglobin is 7.8--baseline usually above 9  AST to ALT ratio is more than 2:1 -No chest pains no palpitations no dizziness CT abdomen and pelvis shows-IMPRESSION: 1. Interval development of cirrhosis and portal venous hypertension. 2. Liver is heterogeneous in appearance without discrete focal mass. 3. Splenomegaly. 4. 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   Assessment & Plan: 1-acute GI bleeding/acute blood loss anemia. -With concerns for esophageal varices versus lower GI bleed given prior history of hemorrhoidectomy. -Patient with ongoing alcoholic cirrhosis and portal hypertension. -Positive ascites -Continue Rocephin for SBP prophylaxis in nonspecific patient with GI bleed -Continue to follow hemoglobin trend and further transfuse as needed -Patient is status post 2 units of fresh frozen plasma and 1 unit PRBC;  -Transfusion threshold hemoglobin less than 7.5, in the setting of acute bleeding. -Hemoglobin drifting down currently at 7.7  -Gastroenterology consult appreciated, GI team hoping to defer EGD until about 10 days post COVID infection if  able -Continue IV PPI. -06/10/21--- had episode of rectal bleeding in the setting of elevated INR, low platelets and drop in H&H  2-alcoholic cirrhosis/ascites/hepatitis -Ultrasound paracentesis planned for later today 06/07/2021 -Follow culture result -Empirically covering for SBP with Rocephin. -Continue current diuretic dose -Continue with propanolol at current dose. -GI service recommends  prednisolone 40 mg every day (started 06/07/21) and calculate Lille score on day 7.   3-Alcohol use disorder, severe, dependence ([place]) -Prior history of withdrawal in the past--no evidence of DTs at this time -Continue benzo per CIWA protocol -Cessation counseling provided -Continue thiamine and folic acid.  4)Elevated INR and Thrombocytopenia (HCC) -In the setting of cirrhosis -Avoid heparin products -SCDs for DVT prophylaxis. -INR is 2.6, platelets 61    5)COVID-19 infection -Admitted on 06/05/2021, COVID test Not done on admission -Tested positive for COVID-19 on 06/08/2021 -Largely asymptomatic -Monoclonal antibody ordered and given on 06/09/2021  7-class II obesity -Body mass index is 36.12 kg/m. -Low calorie diet, portion control and increase physical activity discussed with patient.  DVT prophylaxis: SCDs Code Status: Full code Family Communication: No family at bedside, patient will update family SNF Disposition:   Status is: Inpatient  Remains inpatient appropriate because: Concerns for ongoing GI bleed H&H drifting down  Dispo: The patient is from: Home              Anticipated d/c is to: Home              Patient currently is not medically stable to d/c.   Difficult to place patient No    Consultants:  Gastroenterology service  Procedures:  See below for x-ray report Paracentesis (planned for 06/07/2021)  Antimicrobials:  Rocephin.   Subjective:  -Episode of  bright red blood per rectum this a.m. otherwise no abdominal pain no new concerns -Oral intake is fair no  vomiting  Objective: Vitals:   06/09/21 2226 06/10/21 0448 06/10/21 0500 06/10/21 1259  BP: (!) 103/59 105/71  107/69  Pulse: 72 70  65  Resp: '19 20  17  '$ Temp: 98.1 F (36.7 C) 98.3 F (36.8 C)  98.6 F (37 C)  TempSrc: Oral   Oral  SpO2: 99% 99%  100%  Weight:   86.7 kg   Height:        Intake/Output Summary (Last 24 hours) at 06/10/2021 1817 Last data filed at 06/10/2021 1500 Gross per 24 hour  Intake 480 ml  Output --  Net 480 ml   Filed Weights   06/08/21 0445 06/09/21 0500 06/10/21 0500  Weight: 86.5 kg 86.6 kg 86.7 kg    Examination:  Physical Exam  Gen:- Awake Alert, in no acute distress HEENT:- [place].AT,  +ve sclera icterus Neck-Supple Neck,No JVD,.  Lungs-  CTAB , fair air movement CV- S1, S2 normal, RRR Abd-  +ve B.Sounds, Abd Soft, diffusely tender, no rebound or guarding extremity/Skin:- No  edema, good pedal pulses Psych-affect is appropriate, oriented x3 Neuro-generalized weakness, no new focal deficits, no tremors  Data Reviewed: I have personally reviewed following labs and imaging studies  CBC: Recent Labs  Lab 06/06/21 0602 06/07/21 0201 06/08/21 0403 06/09/21 0513 06/10/21 0547  WBC 4.4 4.9 4.6 6.2 4.3  NEUTROABS  --   --   --   --  2.8  HGB 8.6* 8.4* 8.6* 8.0* 7.7*  HCT 27.8* 27.8* 28.2* 26.4* 25.9*  MCV 81.5 83.0 82.5 83.0 83.8  PLT 65* 62* 65* 68* 61*    Basic Metabolic Panel: Recent Labs  Lab 06/06/21 0602 06/07/21 0201 06/08/21 0403 06/09/21 0513 06/10/21 0547  NA 137 135 133* 137 135  K 3.3* 3.8 4.3 3.9 3.6  CL 104 106 107 108 109  CO2 '27 26 23 25 24  '$ GLUCOSE 80 91 140* 166* 91  BUN 5* 6 <5* 7 8  CREATININE <0.30* 0.35* 0.36* 0.39* 0.35*  CALCIUM 7.3* 7.4* 7.7* 8.0* 7.5*  MG 1.6* 2.0  --   --  1.7  PHOS 3.6  --   --   --  3.2    GFR: Estimated Creatinine Clearance: 95.4 mL/min (A) (by C-G formula based on SCr of 0.35 mg/dL (L)).  Liver Function Tests: Recent Labs  Lab 06/06/21 0602 06/07/21 0201 06/08/21 0403  06/09/21 0513 06/10/21 0547  AST 158* 123* 112* 91* 126*  ALT '29 22 21 19 25  '$ ALKPHOS 150* 134* 127* 127* 123  BILITOT 6.8* 6.8* 5.7* 5.0* 5.1*  PROT 5.9* 5.4* 5.4* 5.5* 5.2*  ALBUMIN 2.3* 2.3* 2.5* 2.6* 2.5*    CBG: Recent Labs  Lab 06/09/21 2224 06/10/21 0716 06/10/21 0848 06/10/21 1114 06/10/21 1618  GLUCAP 112* 100* 87 107* 150*     Recent Results (from the past 240 hour(s))  Urine Culture     Status: Abnormal   Collection Time: 06/05/21 10:43 PM   Specimen: Urine, Clean Catch  Result Value Ref Range Status   Specimen Description   Final    URINE, CLEAN CATCH Performed at [facility], [address] [place], [place] [address]    Special Requests   Final    NONE Performed at [facility], [address] [place],  [address]    Culture MULTIPLE SPECIES PRESENT, SUGGEST RECOLLECTION (A)  Final   Report Status  06/07/2021 FINAL  Final  Culture, body fluid w Gram Stain-bottle     Status: None (Preliminary result)   Collection Time: 06/07/21 11:35 AM   Specimen: Peritoneal Washings  Result Value Ref Range Status   Specimen Description PERITONEAL  Final   Special Requests BOTTLES DRAWN AEROBIC AND ANAEROBIC 10CC  Final   Culture   Final    NO GROWTH 2 DAYS Performed at [facility], [address] [place], [place] [address]    Report Status PENDING  Incomplete  Gram stain     Status: None   Collection Time: 06/07/21 11:35 AM   Specimen: Peritoneal Washings  Result Value Ref Range Status   Specimen Description PERITONEAL  Final   Special Requests PERITONEAL  Final   Gram Stain   Final    ABUNDANT WBC PRESENT, PREDOMINANTLY MONONUCLEAR NO ORGANISMS SEEN Performed at [facility], [address], [place], [place] [address]    Report Status 06/07/2021 FINAL  Final  Resp Panel by RT-PCR (Flu A&B, Covid) Nasopharyngeal Swab     Status: Abnormal   Collection Time: 06/08/21 11:30 AM   Specimen: Nasopharyngeal Swab; Nasopharyngeal(NP) swabs in vial transport  medium  Result Value Ref Range Status   SARS Coronavirus 2 by RT PCR POSITIVE (A) NEGATIVE Final    Comment: CRITICAL RESULT CALLED TO, READ BACK BY AND VERIFIED WITH: SMITH, D AT 1300 ON 8.9.22 BY RUCINSKI,B (NOTE) SARS-CoV-2 target nucleic acids are DETECTED.  The SARS-CoV-2 RNA is generally detectable in upper respiratory specimens during the acute phase of infection. Positive results are indicative of the presence of the identified virus, but do not rule out bacterial infection or co-infection with other pathogens not detected by the test. Clinical correlation with patient history and other diagnostic information is necessary to determine patient infection status. The expected result is Negative.  Fact Sheet for Patients: EntrepreneurPulse.com.au  Fact Sheet for Healthcare Providers: IncredibleEmployment.be  This test is not yet approved or cleared by the [place] FDA and  has been authorized for detection and/or diagnosis of SARS-CoV-2 by FDA under an Emergency Use Authorization (EUA).  This EUA will remain in effect (meaning  this test can be used) for the duration of  the COVID-19 declaration under Section 564(b)(1) of the Act, 21 U.S.C. section 360bbb-3(b)(1), unless the authorization is terminated or revoked sooner.     Influenza A by PCR NEGATIVE NEGATIVE Final   Influenza B by PCR NEGATIVE NEGATIVE Final    Comment: (NOTE) The Xpert Xpress SARS-CoV-2/FLU/RSV plus assay is intended as an aid in the diagnosis of influenza from Nasopharyngeal swab specimens and should not be used as a sole basis for treatment. Nasal washings and aspirates are unacceptable for Xpert Xpress SARS-CoV-2/FLU/RSV testing.  Fact Sheet for Patients: EntrepreneurPulse.com.au  Fact Sheet for Healthcare Providers: IncredibleEmployment.be  This test is not yet approved or cleared by the [place] FDA and has been  authorized for detection and/or diagnosis of SARS-CoV-2 by FDA under an Emergency Use Authorization (EUA). This EUA will remain in effect (meaning this test can be used) for the duration of the COVID-19 declaration under Section 564(b)(1) of the Act, 21 U.S.C. section 360bbb-3(b)(1), unless the authorization is terminated or revoked.  Performed at [facility], [address] [place], [place] [address]       Radiology Studies: DG CHEST PORT 1 VIEW  Result Date: 06/09/2021 CLINICAL DATA:  Tested positive for COVID-19 yesterday EXAM: PORTABLE CHEST 1 VIEW COMPARISON:  Portable exam 0854 hours compared to  10/07/2020 FINDINGS: Upper normal size of cardiac silhouette. Mediastinal contours and pulmonary vascularity normal. Lungs clear. No pulmonary infiltrate, pleural effusion, or pneumothorax. Osseous structures unremarkable. IMPRESSION: No acute abnormalities. Electronically Signed   By: Lavonia Dana M.D.   On: 06/09/2021 11:33     Scheduled Meds:  vitamin C  500 mg Oral Daily   feeding supplement  237 mL Oral BID BM   folic acid  1 mg Oral Daily   furosemide  20 mg Oral Daily   hydrocortisone   Rectal BID   insulin aspart  0-5 Units Subcutaneous QHS   insulin aspart  0-6 Units Subcutaneous TID WC   multivitamin with minerals  1 tablet Oral Daily   pantoprazole  40 mg Oral QAC breakfast   polyethylene glycol  17 g Oral BID   prednisoLONE  40 mg Oral Daily   propranolol  20 mg Oral BID   sodium chloride flush  3 mL Intravenous Q12H   sodium chloride flush  3 mL Intravenous Q12H   sodium chloride flush  3 mL Intravenous Q12H   spironolactone  50 mg Oral Daily   thiamine  100 mg Oral Daily   Or   thiamine  100 mg Intravenous Daily   zinc sulfate  220 mg Oral Daily   Continuous Infusions:  sodium chloride     sodium chloride     cefTRIAXone (ROCEPHIN)  IV 1 g (06/09/21 1824)    LOS: 5 days   Roxan Hockey, MD [facility]   To contact the attending provider between  7A-7P or the covering provider during after hours 7P-7A, please log into the web site [email or web address] and access using universal [place] password for that web site. If you do not have the password, please call the hospital operator.  06/10/2021, 6:17 PM

## 2021-06-11 ENCOUNTER — Telehealth: Payer: Self-pay | Admitting: Gastroenterology

## 2021-06-11 ENCOUNTER — Other Ambulatory Visit: Payer: Self-pay | Admitting: Gastroenterology

## 2021-06-11 DIAGNOSIS — K7011 Alcoholic hepatitis with ascites: Secondary | ICD-10-CM

## 2021-06-11 DIAGNOSIS — K922 Gastrointestinal hemorrhage, unspecified: Secondary | ICD-10-CM

## 2021-06-11 LAB — COMPREHENSIVE METABOLIC PANEL
ALT: 27 U/L (ref 0–44)
AST: 102 U/L — ABNORMAL HIGH (ref 15–41)
Albumin: 2.6 g/dL — ABNORMAL LOW (ref 3.5–5.0)
Alkaline Phosphatase: 142 U/L — ABNORMAL HIGH (ref 38–126)
Anion gap: 4 — ABNORMAL LOW (ref 5–15)
BUN: 6 mg/dL (ref 6–20)
CO2: 23 mmol/L (ref 22–32)
Calcium: 8.2 mg/dL — ABNORMAL LOW (ref 8.9–10.3)
Chloride: 107 mmol/L (ref 98–111)
Creatinine, Ser: <0.3 mg/dL — ABNORMAL LOW (ref 0.44–1.00)
Glucose, Bld: 144 mg/dL — ABNORMAL HIGH (ref 70–99)
Potassium: 4.2 mmol/L (ref 3.5–5.1)
Sodium: 134 mmol/L — ABNORMAL LOW (ref 135–145)
Total Bilirubin: 5.3 mg/dL — ABNORMAL HIGH (ref 0.3–1.2)
Total Protein: 5.6 g/dL — ABNORMAL LOW (ref 6.5–8.1)

## 2021-06-11 LAB — PHOSPHORUS: Phosphorus: 3 mg/dL (ref 2.5–4.6)

## 2021-06-11 LAB — CBC WITH DIFFERENTIAL/PLATELET
Abs Immature Granulocytes: 0.03 10*3/uL (ref 0.00–0.07)
Basophils Absolute: 0 10*3/uL (ref 0.0–0.1)
Basophils Relative: 0 %
Eosinophils Absolute: 0 10*3/uL (ref 0.0–0.5)
Eosinophils Relative: 0 %
HCT: 27.4 % — ABNORMAL LOW (ref 36.0–46.0)
Hemoglobin: 8.3 g/dL — ABNORMAL LOW (ref 12.0–15.0)
Immature Granulocytes: 0 %
Lymphocytes Relative: 13 %
Lymphs Abs: 0.9 10*3/uL (ref 0.7–4.0)
MCH: 25.4 pg — ABNORMAL LOW (ref 26.0–34.0)
MCHC: 30.3 g/dL (ref 30.0–36.0)
MCV: 83.8 fL (ref 80.0–100.0)
Monocytes Absolute: 0.5 10*3/uL (ref 0.1–1.0)
Monocytes Relative: 7 %
Neutro Abs: 5.6 10*3/uL (ref 1.7–7.7)
Neutrophils Relative %: 80 %
Platelets: 68 10*3/uL — ABNORMAL LOW (ref 150–400)
RBC: 3.27 MIL/uL — ABNORMAL LOW (ref 3.87–5.11)
RDW: 23.7 % — ABNORMAL HIGH (ref 11.5–15.5)
WBC: 7.1 10*3/uL (ref 4.0–10.5)
nRBC: 0 % (ref 0.0–0.2)

## 2021-06-11 LAB — GLUCOSE, CAPILLARY
Glucose-Capillary: 140 mg/dL — ABNORMAL HIGH (ref 70–99)
Glucose-Capillary: 148 mg/dL — ABNORMAL HIGH (ref 70–99)
Glucose-Capillary: 160 mg/dL — ABNORMAL HIGH (ref 70–99)

## 2021-06-11 LAB — PROTIME-INR
INR: 2.6 — ABNORMAL HIGH (ref 0.8–1.2)
Prothrombin Time: 27.6 seconds — ABNORMAL HIGH (ref 11.4–15.2)

## 2021-06-11 LAB — MAGNESIUM: Magnesium: 2 mg/dL (ref 1.7–2.4)

## 2021-06-11 LAB — D-DIMER, QUANTITATIVE: D-Dimer, Quant: 8.58 ug/mL-FEU — ABNORMAL HIGH (ref 0.00–0.50)

## 2021-06-11 LAB — C-REACTIVE PROTEIN: CRP: 2.2 mg/dL — ABNORMAL HIGH (ref <1.0)

## 2021-06-11 LAB — FERRITIN: Ferritin: 16 ng/mL (ref 11–307)

## 2021-06-11 MED ORDER — SPIRONOLACTONE 50 MG PO TABS: 50.0000 mg | ORAL_TABLET | Freq: Every day | ORAL | 5 refills | Status: DC

## 2021-06-11 MED ORDER — THIAMINE HCL 100 MG PO TABS
100.0000 mg | ORAL_TABLET | Freq: Every day | ORAL | 3 refills | Status: DC
Start: 2021-06-12 — End: 2021-07-18

## 2021-06-11 MED ORDER — PROPRANOLOL HCL 20 MG PO TABS
20.0000 mg | ORAL_TABLET | Freq: Two times a day (BID) | ORAL | 4 refills | Status: DC
Start: 2021-06-11 — End: 2021-07-18

## 2021-06-11 MED ORDER — PREDNISOLONE 5 MG PO TABS: 40.0000 mg | ORAL_TABLET | Freq: Every day | ORAL | 0 refills | Status: DC

## 2021-06-11 MED ORDER — HYDROCORTISONE ACETATE 25 MG RE SUPP: 25.0000 mg | Freq: Two times a day (BID) | RECTAL | 1 refills | Status: DC

## 2021-06-11 MED ORDER — POTASSIUM CHLORIDE ER 10 MEQ PO TBCR: 10.0000 meq | EXTENDED_RELEASE_TABLET | Freq: Every day | ORAL | 2 refills | Status: DC

## 2021-06-11 MED ORDER — ADULT MULTIVITAMIN W/MINERALS CH: 1.0000 | ORAL_TABLET | Freq: Every day | ORAL | 3 refills | Status: DC

## 2021-06-11 MED ORDER — ZINC SULFATE 220 (50 ZN) MG PO CAPS: 220.0000 mg | ORAL_CAPSULE | Freq: Every day | ORAL | 2 refills | Status: DC

## 2021-06-11 MED ORDER — FUROSEMIDE 20 MG PO TABS: 20.0000 mg | ORAL_TABLET | Freq: Every day | ORAL | 2 refills | Status: DC

## 2021-06-11 MED ORDER — PANTOPRAZOLE SODIUM 40 MG PO TBEC
40.0000 mg | DELAYED_RELEASE_TABLET | Freq: Two times a day (BID) | ORAL | 3 refills | Status: DC
Start: 2021-06-11 — End: 2021-07-18

## 2021-06-11 MED ORDER — FOLIC ACID 1 MG PO TABS: 1.0000 mg | ORAL_TABLET | Freq: Every day | ORAL | 3 refills | Status: DC

## 2021-06-11 MED ORDER — SERTRALINE HCL 100 MG PO TABS: 100.0000 mg | ORAL_TABLET | Freq: Every day | ORAL | 3 refills | Status: DC

## 2021-06-11 MED ORDER — ASCORBIC ACID 500 MG PO TABS: 500.0000 mg | ORAL_TABLET | Freq: Every day | ORAL | 5 refills | Status: DC

## 2021-06-11 NOTE — Progress Notes (Cosign Needed)
    Subjective: No significant abdominal pain. No rectal bleeding. No N/V. Tolerating diet. No shortness of breath. Wants to go home. No mental status changes or confusion.   Objective: Vital signs in last 24 hours: Temp:  [98 F (36.7 C)-98.6 F (37 C)] 98.1 F (36.7 C) (08/12 0818) Pulse Rate:  [55-65] 55 (08/12 0818) Resp:  [17-18] 18 (08/12 0818) BP: (94-108)/(62-69) 108/69 (08/12 0818) SpO2:  [98 %-100 %] 100 % (08/12 0818) Weight:  [86.8 kg] 86.8 kg (08/12 0500) Last BM Date: 06/09/21 General:   Alert and oriented, pleasant Head:  Normocephalic and atraumatic.  Abdomen:  Bowel sounds present, soft, mild TTP without rebound or guarding, obese Msk:  Symmetrical without gross deformities. Normal posture. Neurologic:  Alert and  oriented x4   Intake/Output from previous day: 08/11 0701 - 08/12 0700 In: 720 [P.O.:720] Out: -  Intake/Output this shift: No intake/output data recorded.  Lab Results: Recent Labs    06/09/21 0513 06/10/21 0547 06/11/21 0417  WBC 6.2 4.3 7.1  HGB 8.0* 7.7* 8.3*  HCT 26.4* 25.9* 27.4*  PLT 68* 61* 68*   BMET Recent Labs    06/09/21 0513 06/10/21 0547 06/11/21 0417  NA 137 135 134*  K 3.9 3.6 4.2  CL 108 109 107  CO2 '25 24 23  '$ GLUCOSE 166* 91 144*  BUN '7 8 6  '$ CREATININE 0.39* 0.35* <0.30*  CALCIUM 8.0* 7.5* 8.2*   LFT Recent Labs    06/09/21 0513 06/10/21 0547 06/11/21 0417  PROT 5.5* 5.2* 5.6*  ALBUMIN 2.6* 2.5* 2.6*  AST 91* 126* 102*  ALT '19 25 27  '$ ALKPHOS 127* 123 142*  BILITOT 5.0* 5.1* 5.3*   PT/INR Recent Labs    06/10/21 0547 06/11/21 0417  LABPROT 27.7* 27.6*  INR 2.6* 2.6*     Assessment: 40 year old female with history of cirrhosis likely due to alcohol, presenting with decompensated disease, rectal bleeding, abdominal pain, and alcoholic hepatitis. Prior history significant for SBP in 2018 at [facility], prior decompensated liver disease, rectal bleeding due to hemorrhoids. MELD Na 25 today.    Alcoholic hepatitis: DF [id number] depending on control PT. Prednisolone started on 8/9. Will need Lille score on 8/15 to calculate response and if needs to continue. Will arrange this as outpatient. Bilirubin typically will have a lag effect in these scenarios and last to trend down.  ETOH cirrhosis: decompensated. Last EGD in 2018 with portal gastropathy. Will need EGD as outpatient as unable to complete in light of incidental Covid-19 diagnosis this admission.   Rectal bleeding: resolved. Likely benign source.   Abdominal pain: improved. Negative para for SBP.   Coagulopathy: INR stable at 2.6, similar to yesterday. Allergic to Vit K.   Plan: Appropriate for discharge home Patient will have labs done on 8/15 to calculate Lille score Continue prednisolone 40 mg daily Continue propranolol Continue spironolactone 50 mg daily and lasix 20 mg daily Has completed course of antibiotics: No need to discharge home on oral PPI BID Will arrange outpatient follow-up Outpatient EGD to be arranged    I personally spoke with patient and asked that she have labs done on Monday, August 15th. She states understanding. Will need to go to [facility], across from the hospital on the second floor.    Annitta Needs, PhD, ANP-BC [facility]     LOS: 6 days    06/11/2021, 12:09 PM

## 2021-06-11 NOTE — Progress Notes (Signed)
Nsg Discharge Note  Admit Date:  06/05/2021 Discharge date: 06/11/2021   Charmion Hehr to be D/C'd Home per MD order.  AVS completed.  Copy for chart, and copy for patient signed, and dated. Patient/caregiver able to verbalize understanding.  Discharge Medication: Allergies as of 06/11/2021       Reactions   Vitamin K And Related Hives, Shortness Of Breath, Itching, Rash   Vancomycin Hives, Itching        Medication List     STOP taking these medications    cefTRIAXone 2 g in dextrose 5 % 50 mL   feeding supplement Liqd   hydrocortisone 2.5 % rectal cream Commonly known as: ANUSOL-HC   potassium chloride 10 MEQ CR capsule Commonly known as: MICRO-K Replaced by: potassium chloride 10 MEQ tablet   traMADol 50 MG tablet Commonly known as: ULTRAM       TAKE these medications    ascorbic acid 500 MG tablet Commonly known as: VITAMIN C Take 1 tablet (500 mg total) by mouth daily. Start taking on: August 13, 123456   folic acid 1 MG tablet Commonly known as: FOLVITE Take 1 tablet (1 mg total) by mouth daily. Start taking on: June 12, 2021   furosemide 20 MG tablet Commonly known as: Lasix Take 1 tablet (20 mg total) by mouth daily. What changed: how much to take   hydrocortisone 25 MG suppository Commonly known as: ANUSOL-HC Place 1 suppository (25 mg total) rectally every 12 (twelve) hours.   multivitamin with minerals Tabs tablet Take 1 tablet by mouth daily. Start taking on: June 12, 2021   pantoprazole 40 MG tablet Commonly known as: PROTONIX Take 1 tablet (40 mg total) by mouth 2 (two) times daily. What changed: when to take this   potassium chloride 10 MEQ tablet Commonly known as: KLOR-CON Take 1 tablet (10 mEq total) by mouth daily. Take While taking Lasix/furosemide Replaces: potassium chloride 10 MEQ CR capsule   prednisoLONE 5 MG Tabs tablet Take 8 tablets (40 mg total) by mouth daily. Start taking on: June 12, 2021    propranolol 20 MG tablet Commonly known as: INDERAL Take 1 tablet (20 mg total) by mouth 2 (two) times daily.   sertraline 100 MG tablet Commonly known as: ZOLOFT Take 1 tablet (100 mg total) by mouth daily. For depression   spironolactone 50 MG tablet Commonly known as: ALDACTONE Take 1 tablet (50 mg total) by mouth daily. What changed:  medication strength how much to take   thiamine 100 MG tablet Take 1 tablet (100 mg total) by mouth daily. Start taking on: June 12, 2021   zinc sulfate 220 (50 Zn) MG capsule Take 1 capsule (220 mg total) by mouth daily. Start taking on: June 12, 2021       ASK your doctor about these medications    chlordiazePOXIDE 25 MG capsule Commonly known as: LIBRIUM '50mg'$  PO TID x 1D, then 25-'50mg'$  PO BID X 1D, then 25-'50mg'$  PO QD X 1D        Discharge Assessment: Vitals:   06/11/21 0818 06/11/21 1517  BP: 108/69 105/70  Pulse: (!) 55 (!) 57  Resp: 18 16  Temp: 98.1 F (36.7 C) (!) 97.5 F (36.4 C)  SpO2: 100% 100%   Skin clean, dry and intact without evidence of skin break down, no evidence of skin tears noted. IV catheter discontinued intact. Site without signs and symptoms of complications - no redness or edema noted at insertion site, patient denies c/o  pain - only slight tenderness at site.  Dressing with slight pressure applied.  D/c Instructions-Education: Discharge instructions given to patient/family with verbalized understanding. D/c education completed with patient/family including follow up instructions, medication list, d/c activities limitations if indicated, with other d/c instructions as indicated by MD - patient able to verbalize understanding, all questions fully answered. Patient instructed to return to ED, call 911, or call MD for any changes in condition.  Patient escorted via [place], and D/C home via private auto.  Dorcas Mcmurray, LPN [id number] PM

## 2021-06-11 NOTE — Telephone Encounter (Signed)
Labs entered to be completed on 8/15 at [facility]. I have released orders. Patient aware.   Stacey: please arrange follow-up in our office in 4 weeks, may use urgent. Let me know if not available.

## 2021-06-11 NOTE — Discharge Instructions (Signed)
1)Avoid ibuprofen/Advil/Aleve/Motrin/Goody Powders/Naproxen/BC powders/Meloxicam/Diclofenac/Indomethacin and other Nonsteroidal anti-inflammatory medications as these will make you more likely to bleed and can cause stomach ulcers, can also cause Kidney problems.   2) please go to  [facility] across from the hospital on the second floor-  --- To have blood work s done on Monday, August 15th.   3) complete abstinence from alcohol advised  4) outpatient follow-up with gastroenterologist advised  5)You are strongly advised to isolate/quarantine for at least 10 days from the date of your diagnosis with COVID-19 infection--please always wear a mask if you have to go outside the house

## 2021-06-11 NOTE — TOC Transition Note (Signed)
Transition of Care [facility]) - CM/SW Discharge Note   Patient Details  Name: Victoria Gordon MRN: [id number] Date of Birth: 13-Apr-1981  Transition of [facility]) CM/SW Contact:  Shade Flood, LCSW Phone Number: 06/11/2021, 2:05 PM   Clinical Narrative:     Pt admitted from home. TOC received consult for SA treatment resources. Spoke with pt and provided verbal and written information on treatment options. Encouraged pt to follow up. Also provided information on [facility] for medical treatment follow up.   There were no other TOC needs identified.  Final next level of care: Home/Self Care Barriers to Discharge: Barriers Resolved   Patient Goals and CMS Choice        Discharge Placement                       Discharge Plan and Services In-house Referral: Clinical Social Work Discharge Planning Services: [facility]                                 Social Determinants of Health (SDOH) Interventions     Readmission Risk Interventions No flowsheet data found.

## 2021-06-11 NOTE — Discharge Summary (Signed)
Victoria Gordon, is a 40 y.o. female  DOB 1980-12-09  MRN [id number].  Admission date:  06/05/2021  Admitting Physician  Roxan Hockey, MD  Discharge Date:  06/11/2021   Primary MD  Patient, [name]  Recommendations for primary care physician for things to follow:   1)Avoid ibuprofen/Advil/Aleve/Motrin/Goody Powders/Naproxen/BC powders/Meloxicam/Diclofenac/Indomethacin and other Nonsteroidal anti-inflammatory medications as these will make you more likely to bleed and can cause stomach ulcers, can also cause Kidney problems.   2) please go to  [facility] across from the hospital on the second floor-  --- To have blood work s done on Monday, August 15th.   3) complete abstinence from alcohol advised  4) outpatient follow-up with gastroenterologist advised  5)You are strongly advised to isolate/quarantine for at least 10 days from the date of your diagnosis with COVID-19 infection--please always wear a mask if you have to go outside the house   Admission Diagnosis  Acute GI bleeding 123456 Alcoholic cirrhosis of liver without ascites ([place]) [K70.30]  Discharge Diagnosis  Acute GI bleeding 123456 Alcoholic cirrhosis of liver without ascites ([place]) [K70.30]    Principal Problem:   Acute GI bleeding Active Problems:   Alcohol use disorder, severe, dependence ([place])   Ascites due to alcoholic cirrhosis (HCC)   Thrombocytopenia ([place])   Alcoholic cirrhosis of liver with ascites ([place])   Alcoholic hepatitis with ascites   Rectal bleeding   Anemia     Past Medical History:  Diagnosis Date   Alcohol abuse    Anemia    Anxiety    Hemorrhoid    Hepatitis B    Major depressive disorder    PTSD (post-traumatic stress disorder)    Substance abuse ([place])     Past Surgical History:  Procedure Laterality Date   CESAREAN SECTION     CHOLECYSTECTOMY     MOUTH SURGERY     TUBAL LIGATION  Bilateral 07/22/2014   Procedure: POST PARTUM TUBAL LIGATION;  Surgeon: Woodroe Mode, MD;  Location: [facility];  Service: Gynecology;  Laterality: Bilateral;    HPI  from the history and physical done on the day of admission:    Victoria Gordon  is a 40 y.o. female female with longstanding history of alcohol abuse, who unfortunately recently relapsed and has been drinking again, as well as history of alcoholic liver cirrhosis with portal hypertension, ascites and chronic thrombocytopenia and elevated INR presents with concerns about bright red blood per rectum and melena, patient also has hemorrhoids -Patient endorses abdominal pain,.  Some nausea but no emesis No fever  Or chills  -No productive cough  -In the ED UA suspicious for UTI -Lipase is 43, bilirubin is 6.2 potassium is 3.5 -Hemoglobin is down to 8.5, repeat hemoglobin is 7.8--baseline usually above 9  AST to ALT ratio is more than 2:1  -No chest pains no palpitations no dizziness CT abdomen and pelvis shows-IMPRESSION: 1. Interval development of cirrhosis and portal venous hypertension. 2. Liver is heterogeneous in appearance without discrete focal  mass. 3. Splenomegaly. 4. 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    Hospital Course:     Assessment & Plan: 1-acute GI bleeding/acute blood loss anemia. -With concerns for esophageal varices versus lower GI bleed given prior history of hemorrhoidectomy. -Patient with ongoing alcoholic cirrhosis and portal hypertension. -Positive ascites -Continue to follow hemoglobin trend and further transfuse as needed -Patient is status post 2 units of fresh frozen plasma and 1 unit PRBC;  -Transfusion threshold hemoglobin less than 7.5, in the setting of acute bleeding. -Hemoglobin up to 8.3 from 7.7, no further GI bleed -Gastroenterology consult appreciated, GI team hoping to defer EGD until about 10 days post COVID infection if able -Discharge on PPI, outpatient follow-up  with GI repeat blood work on 06/14/2021 advised -GI plans outpatient EGD   2-alcoholic cirrhosis/ascites/hepatitis -Ultrasound paracentesis planned for later today 06/07/2021 -Follow culture result -Empirically treated for SBP with Rocephin. -Continue Aldactone and Lasix -Continue with propanolol  -GI service recommends  prednisolone 40 mg every day (started 06/07/21) and calculate Lille score on day 7 (on 06/14/21)  -Patient will have labs done on 06/14/21 to calculate Lille score -  3-Alcohol use disorder, severe, dependence ([place]) -Prior history of withdrawal in the past--no evidence of DTs at this time -Complete abstinence from alcohol advised -Continue thiamine and folic acid.   4)Elevated INR and Thrombocytopenia (HCC) -In the setting of cirrhosis -INR stable at 2.6, platelets up to 68,   5)COVID-19 infection -Admitted on 06/05/2021, COVID test Not done on admission -Tested positive for COVID-19 on 06/08/2021 -Largely asymptomatic -Monoclonal antibody ordered and given on 06/09/2021   7-class II obesity -Body mass index is 36.12 kg/m. -Low calorie diet, portion control and increase physical activity discussed with patient.   DVT prophylaxis: SCDs Code Status: Full code Family Communication:  No family at bedside, patient will update family SNF Disposition: Home  Dispo: The patient is from: Home              Anticipated d/c is to: Home                 Consultants:  Gastroenterology service   Procedures:  See below for x-ray report Paracentesis (planned for 06/07/2021)  Discharge Condition: stable  Follow UP   Follow-up Information     Annitta Needs, NP. Schedule an appointment as soon as possible for a visit.   Specialty: Gastroenterology Contact information: [address] [place] [address] [phone]                  Diet and Activity recommendation:  As advised  Discharge Instructions   Discharge Instructions     Call MD for:  difficulty  breathing, headache or visual disturbances   Complete by: As directed    Call MD for:  persistant dizziness or light-headedness   Complete by: As directed    Call MD for:  persistant nausea and vomiting   Complete by: As directed    Call MD for:  temperature >100.4   Complete by: As directed    Diet - low sodium heart healthy   Complete by: As directed    Discharge instructions   Complete by: As directed    1)Avoid ibuprofen/Advil/Aleve/Motrin/Goody Powders/Naproxen/BC powders/Meloxicam/Diclofenac/Indomethacin and other Nonsteroidal anti-inflammatory medications as these will make you more likely to bleed and can cause stomach ulcers, can also cause Kidney problems.   2) please go to  [facility] across from the hospital on the second floor-  --- To have  blood work s done on Monday, August 15th.   3) complete abstinence from alcohol advised  4) outpatient follow-up with gastroenterologist advised  5)You are strongly advised to isolate/quarantine for at least 10 days from the date of your diagnosis with COVID-19 infection--please always wear a mask if you have to go outside the house   Increase activity slowly   Complete by: As directed          Discharge Medications     Allergies as of 06/11/2021       Reactions   Vitamin K And Related Hives, Shortness Of Breath, Itching, Rash   Vancomycin Hives, Itching        Medication List     STOP taking these medications    cefTRIAXone 2 g in dextrose 5 % 50 mL   feeding supplement Liqd   hydrocortisone 2.5 % rectal cream Commonly known as: ANUSOL-HC   potassium chloride 10 MEQ CR capsule Commonly known as: MICRO-K Replaced by: potassium chloride 10 MEQ tablet   traMADol 50 MG tablet Commonly known as: ULTRAM       TAKE these medications    ascorbic acid 500 MG tablet Commonly known as: VITAMIN C Take 1 tablet (500 mg total) by mouth daily. Start taking on: August 13, 123456   folic acid 1 MG tablet Commonly  known as: FOLVITE Take 1 tablet (1 mg total) by mouth daily. Start taking on: June 12, 2021   furosemide 20 MG tablet Commonly known as: Lasix Take 1 tablet (20 mg total) by mouth daily. What changed: how much to take   hydrocortisone 25 MG suppository Commonly known as: ANUSOL-HC Place 1 suppository (25 mg total) rectally every 12 (twelve) hours.   multivitamin with minerals Tabs tablet Take 1 tablet by mouth daily. Start taking on: June 12, 2021   pantoprazole 40 MG tablet Commonly known as: PROTONIX Take 1 tablet (40 mg total) by mouth 2 (two) times daily. What changed: when to take this   potassium chloride 10 MEQ tablet Commonly known as: KLOR-CON Take 1 tablet (10 mEq total) by mouth daily. Take While taking Lasix/furosemide Replaces: potassium chloride 10 MEQ CR capsule   prednisoLONE 5 MG Tabs tablet Take 8 tablets (40 mg total) by mouth daily. Start taking on: June 12, 2021   propranolol 20 MG tablet Commonly known as: INDERAL Take 1 tablet (20 mg total) by mouth 2 (two) times daily.   sertraline 100 MG tablet Commonly known as: ZOLOFT Take 1 tablet (100 mg total) by mouth daily. For depression   spironolactone 50 MG tablet Commonly known as: ALDACTONE Take 1 tablet (50 mg total) by mouth daily. What changed:  medication strength how much to take   thiamine 100 MG tablet Take 1 tablet (100 mg total) by mouth daily. Start taking on: June 12, 2021   zinc sulfate 220 (50 Zn) MG capsule Take 1 capsule (220 mg total) by mouth daily. Start taking on: June 12, 2021       ASK your doctor about these medications    chlordiazePOXIDE 25 MG capsule Commonly known as: LIBRIUM '50mg'$  PO TID x 1D, then 25-'50mg'$  PO BID X 1D, then 25-'50mg'$  PO QD X 1D        Major procedures and Radiology Reports - PLEASE review detailed and final reports for all details, in brief -   CT ABDOMEN PELVIS W CONTRAST  Result Date: 06/05/2021 CLINICAL DATA:  Abdominal  pain, distention, and swelling of both LOWER extremities. Bright  red bloody stools for 1 week. Prior cholecystectomy. EXAM: CT ABDOMEN AND PELVIS WITH CONTRAST TECHNIQUE: Multidetector CT imaging of the abdomen and pelvis was performed using the standard protocol following bolus administration of intravenous contrast. CONTRAST:  69m OMNIPAQUE IOHEXOL 350 MG/ML SOLN COMPARISON:  09/20/2017 FINDINGS: Lower chest: Lung bases are unremarkable.  Heart size is normal. Hepatobiliary: Significant ascites. Interval development of nodular, heterogeneous appearance of the liver without discrete focal lesion. Recanalized umbilical vein. Prominent caudate lobe. Prior cholecystectomy. Pancreas: Unremarkable. No pancreatic ductal dilatation or surrounding inflammatory changes. Spleen: Enlarged spleen without focal lesion. Adrenals/Urinary Tract: Normal adrenal glands. Kidneys are symmetric in size and enhancement. No hydronephrosis or suspicious renal mass. No evidence for ureteral obstruction. Decompressed urinary bladder is unremarkable. 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 Stomach is otherwise normal in appearance. Small bowel loops are normal in appearance. Colon is unremarkable. The appendix is well seen and has a normal appearance. 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 There is normal vascular opacification of the celiac axis, superior mesenteric artery, and inferior mesenteric artery. Normal appearance of the portal venous system and inferior vena cava. No retroperitoneal or mesenteric adenopathy. Reproductive: Uterus is present. Probable tubal ligation clip in the RIGHT adnexal region. A fatty attenuation partially calcified mass is identified in the LEFT adnexal region is consistent with LEFT ovarian dermoid, 2.7 centimeters and previously 3.4 centimeters. Other: Significant ascites throughout the abdomen and pelvis. Body wall edema.  Musculoskeletal: No acute or significant osseous findings. IMPRESSION: 1. Interval development of cirrhosis and portal venous hypertension. 2. Liver is heterogeneous in appearance without discrete focal mass. 3. Splenomegaly. 4. 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 5. No evidence for acute urinary tract or gastrointestinal tract abnormality. 6. Smaller LEFT ovarian dermoid. 7. Significant body wall edema. Electronically Signed   By: ENolon NationsM.D.   On: 06/05/2021 17:23   UKoreaParacentesis  Result Date: 06/07/2021 INDICATION: Cirrhotic liver by CT, ascites EXAM: ULTRASOUND GUIDED DIAGNOSTIC AND THERAPEUTIC PARACENTESIS MEDICATIONS: None COMPLICATIONS: None immediate PROCEDURE: Informed written consent was obtained from the patient after a discussion of the risks, benefits and alternatives to treatment. A timeout was performed prior to the initiation of the procedure. Initial ultrasound scanning demonstrates a large amount of ascites within the LEFT lower abdominal quadrant. The right lower abdomen was prepped and draped in the usual sterile fashion. 1% lidocaine was used for local anesthesia. Following this, a 5 [place] catheter was introduced. An ultrasound image was saved for documentation purposes. The paracentesis was performed. The catheter was removed and a dressing was applied. The patient tolerated the procedure well without immediate post procedural complication. Patient received post-procedure intravenous albumin; see nursing notes for details. FINDINGS: A total of approximately 1.9 L of cloudy yellow ascitic fluid was removed. Samples were sent to the laboratory as requested by the clinical team. IMPRESSION: Successful ultrasound-guided paracentesis yielding 1.9 L liters of peritoneal fluid. Electronically Signed   By: MLavonia DanaM.D.   On: 06/07/2021 12:23   DG CHEST PORT 1 VIEW  Result Date: 06/09/2021 CLINICAL DATA:  Tested positive for COVID-19 yesterday EXAM: PORTABLE  CHEST 1 VIEW COMPARISON:  Portable exam [id number] compared to 10/07/2020 FINDINGS: Upper normal size of cardiac silhouette. Mediastinal contours and pulmonary vascularity normal. Lungs clear. No pulmonary infiltrate, pleural effusion, or pneumothorax. Osseous structures unremarkable. IMPRESSION: No acute abnormalities. Electronically Signed   By: MLavonia DanaM.D.   On: 06/09/2021 11:33  Micro Results    Recent Results (from the past 240 hour(s))  Urine Culture     Status: Abnormal   Collection Time: 06/05/21 10:43 PM   Specimen: Urine, Clean Catch  Result Value Ref Range Status   Specimen Description   Final    URINE, CLEAN CATCH Performed at [facility], [address], [place], [place] [address]    Special Requests   Final    NONE Performed at [facility], [address], [place], [place] [address]    Culture MULTIPLE SPECIES PRESENT, SUGGEST RECOLLECTION (A)  Final   Report Status 06/07/2021 FINAL  Final  Culture, body fluid w Gram Stain-bottle     Status: None (Preliminary result)   Collection Time: 06/07/21 11:35 AM   Specimen: Peritoneal Washings  Result Value Ref Range Status   Specimen Description PERITONEAL  Final   Special Requests BOTTLES DRAWN AEROBIC AND ANAEROBIC 10CC  Final   Culture   Final    NO GROWTH 4 DAYS Performed at [facility], [address] [place], [place] [address]    Report Status PENDING  Incomplete  Gram stain     Status: None   Collection Time: 06/07/21 11:35 AM   Specimen: Peritoneal Washings  Result Value Ref Range Status   Specimen Description PERITONEAL  Final   Special Requests PERITONEAL  Final   Gram Stain   Final    ABUNDANT WBC PRESENT, PREDOMINANTLY MONONUCLEAR NO ORGANISMS SEEN Performed at [facility], [address] [place],  [address]    Report Status 06/07/2021 FINAL  Final  Resp Panel by RT-PCR (Flu A&B, Covid) Nasopharyngeal Swab     Status: Abnormal   Collection Time: 06/08/21 11:30 AM   Specimen:  Nasopharyngeal Swab; Nasopharyngeal(NP) swabs in vial transport medium  Result Value Ref Range Status   SARS Coronavirus 2 by RT PCR POSITIVE (A) NEGATIVE Final    Comment: CRITICAL RESULT CALLED TO, READ BACK BY AND VERIFIED WITH: SMITH, D AT 1300 ON 8.9.22 BY RUCINSKI,B (NOTE) SARS-CoV-2 target nucleic acids are DETECTED.  The SARS-CoV-2 RNA is generally detectable in upper respiratory specimens during the acute phase of infection. Positive results are indicative of the presence of the identified virus, but do not rule out bacterial infection or co-infection with other pathogens not detected by the test. Clinical correlation with patient history and other diagnostic information is necessary to determine patient infection status. The expected result is Negative.  Fact Sheet for Patients: EntrepreneurPulse.com.au  Fact Sheet for Healthcare Providers: IncredibleEmployment.be  This test is not yet approved or cleared by the [place] FDA and  has been authorized for detection and/or diagnosis of SARS-CoV-2 by FDA under an Emergency Use Authorization (EUA).  This EUA will remain in effect (meaning  this test can be used) for the duration of  the COVID-19 declaration under Section 564(b)(1) of the Act, 21 U.S.C. section 360bbb-3(b)(1), unless the authorization is terminated or revoked sooner.     Influenza A by PCR NEGATIVE NEGATIVE Final   Influenza B by PCR NEGATIVE NEGATIVE Final    Comment: (NOTE) The Xpert Xpress SARS-CoV-2/FLU/RSV plus assay is intended as an aid in the diagnosis of influenza from Nasopharyngeal swab specimens and should not be used as a sole basis for treatment. Nasal washings and aspirates are unacceptable for Xpert Xpress SARS-CoV-2/FLU/RSV testing.  Fact Sheet for Patients: EntrepreneurPulse.com.au  Fact Sheet for Healthcare Providers: IncredibleEmployment.be  This test is  not yet approved or cleared by the [place] and  has been authorized for detection and/or diagnosis of SARS-CoV-2 by FDA under an Emergency Use Authorization (EUA). This EUA will remain in effect (meaning this test can be used) for the duration of the COVID-19 declaration under Section 564(b)(1) of the Act, 21 U.S.C. section 360bbb-3(b)(1), unless the authorization is terminated or revoked.  Performed at [facility], [address], [place], [place] [address]        Today   Subjective    Victoria Gordon today has no new complaints Had non-bloody BM -Tolerating oral intake well  No Nausea, Vomiting or Diarrhea No fever  Or chills        Patient has been seen and examined prior to discharge   Objective   Blood pressure 105/70, pulse (!) 57, temperature (!) 97.5 F (36.4 C), temperature source Oral, resp. rate 16, height '5\' 1"'$  (1.549 m), weight 86.8 kg, last menstrual period 05/29/2021, SpO2 100 %.   Intake/Output Summary (Last 24 hours) at 06/11/2021 1722 Last data filed at 06/11/2021 1300 Gross per 24 hour  Intake 480 ml  Output --  Net 480 ml   Exam Gen:- Awake Alert, in no acute distress HEENT:- [place].AT,  +ve sclera icterus Neck-Supple Neck,No JVD,.  Lungs-  CTAB , fair air movement CV- S1, S2 normal, RRR Abd-  +ve B.Sounds, Abd Soft, diffusely tender, no rebound or guarding extremity/Skin:- No  edema, good pedal pulses Psych-affect is appropriate, oriented x3 Neuro-generalized weakness, no new focal deficits, no tremors   Data Review   CBC w Diff:  Lab Results  Component Value Date   WBC 7.1 06/11/2021   HGB 8.3 (L) 06/11/2021   HCT 27.4 (L) 06/11/2021   PLT 68 (L) 06/11/2021   LYMPHOPCT 13 06/11/2021   MONOPCT 7 06/11/2021   EOSPCT 0 06/11/2021   BASOPCT 0 06/11/2021    CMP:  Lab Results  Component Value Date   NA 134 (L) 06/11/2021   K 4.2 06/11/2021   CL 107 06/11/2021   CO2 23 06/11/2021   BUN 6 06/11/2021   CREATININE <0.30  (L) 06/11/2021   PROT 5.6 (L) 06/11/2021   ALBUMIN 2.6 (L) 06/11/2021   BILITOT 5.3 (H) 06/11/2021   ALKPHOS 142 (H) 06/11/2021   AST 102 (H) 06/11/2021   ALT 27 06/11/2021  .   Total Discharge time is about 33 minutes  Roxan Hockey M.D on 06/11/2021 at 5:22 PM  Go to [email or web address] -  for contact info  [facility] - Office  [phone]

## 2021-06-12 LAB — CULTURE, BODY FLUID W GRAM STAIN -BOTTLE: Culture: NO GROWTH

## 2021-06-15 ENCOUNTER — Encounter: Payer: Self-pay | Admitting: Gastroenterology

## 2021-06-15 NOTE — Telephone Encounter (Signed)
She needs to be on an urgent cancellation list and priority for coming in, please. Thank you!

## 2021-06-15 NOTE — Telephone Encounter (Signed)
Victoria Gordon, can you ask patient to please complete labs as requested? Was due on Monday. Important that she completes this.

## 2021-06-15 NOTE — Telephone Encounter (Signed)
Phoned and LMOVM reminding the pt to go have her blood work completed. It was due Monday and also to call office with any questions/concerns.

## 2021-06-17 NOTE — Telephone Encounter (Signed)
Phoned and LMOVM for the pt to return call regarding lab work

## 2021-06-18 NOTE — Telephone Encounter (Signed)
Reminder letter and labs sent to the pt.

## 2021-06-28 LAB — ACID FAST SMEAR (AFB, MYCOBACTERIA): Acid Fast Smear: NEGATIVE

## 2021-07-14 ENCOUNTER — Encounter (HOSPITAL_COMMUNITY): Payer: Self-pay | Admitting: Emergency Medicine

## 2021-07-14 ENCOUNTER — Inpatient Hospital Stay (HOSPITAL_COMMUNITY)
Admission: EM | Admit: 2021-07-14 | Discharge: 2021-07-18 | DRG: 433 | Disposition: A | Discharge status: Home / Self Care | Payer: Medicaid Other | Attending: Family Medicine | Admitting: Family Medicine

## 2021-07-14 ENCOUNTER — Other Ambulatory Visit: Payer: Self-pay

## 2021-07-14 DIAGNOSIS — Z79899 Other long term (current) drug therapy: Secondary | ICD-10-CM

## 2021-07-14 DIAGNOSIS — Z20822 Contact with and (suspected) exposure to covid-19: Secondary | ICD-10-CM | POA: Diagnosis present

## 2021-07-14 DIAGNOSIS — F419 Anxiety disorder, unspecified: Secondary | ICD-10-CM | POA: Diagnosis present

## 2021-07-14 DIAGNOSIS — F1020 Alcohol dependence, uncomplicated: Secondary | ICD-10-CM | POA: Diagnosis present

## 2021-07-14 DIAGNOSIS — E876 Hypokalemia: Secondary | ICD-10-CM | POA: Diagnosis present

## 2021-07-14 DIAGNOSIS — Z8619 Personal history of other infectious and parasitic diseases: Secondary | ICD-10-CM

## 2021-07-14 DIAGNOSIS — K219 Gastro-esophageal reflux disease without esophagitis: Secondary | ICD-10-CM | POA: Diagnosis present

## 2021-07-14 DIAGNOSIS — N390 Urinary tract infection, site not specified: Secondary | ICD-10-CM | POA: Diagnosis present

## 2021-07-14 DIAGNOSIS — G909 Disorder of the autonomic nervous system, unspecified: Secondary | ICD-10-CM | POA: Diagnosis present

## 2021-07-14 DIAGNOSIS — K7031 Alcoholic cirrhosis of liver with ascites: Principal | ICD-10-CM | POA: Diagnosis present

## 2021-07-14 DIAGNOSIS — R109 Unspecified abdominal pain: Secondary | ICD-10-CM

## 2021-07-14 DIAGNOSIS — D696 Thrombocytopenia, unspecified: Secondary | ICD-10-CM | POA: Diagnosis present

## 2021-07-14 DIAGNOSIS — Z881 Allergy status to other antibiotic agents status: Secondary | ICD-10-CM

## 2021-07-14 DIAGNOSIS — R188 Other ascites: Secondary | ICD-10-CM

## 2021-07-14 DIAGNOSIS — E8809 Other disorders of plasma-protein metabolism, not elsewhere classified: Secondary | ICD-10-CM | POA: Diagnosis present

## 2021-07-14 DIAGNOSIS — Z8719 Personal history of other diseases of the digestive system: Secondary | ICD-10-CM

## 2021-07-14 DIAGNOSIS — Z9049 Acquired absence of other specified parts of digestive tract: Secondary | ICD-10-CM

## 2021-07-14 DIAGNOSIS — F4310 Post-traumatic stress disorder, unspecified: Secondary | ICD-10-CM | POA: Diagnosis present

## 2021-07-14 DIAGNOSIS — F329 Major depressive disorder, single episode, unspecified: Secondary | ICD-10-CM | POA: Diagnosis present

## 2021-07-14 DIAGNOSIS — Z888 Allergy status to other drugs, medicaments and biological substances status: Secondary | ICD-10-CM

## 2021-07-14 DIAGNOSIS — Y908 Blood alcohol level of 240 mg/100 ml or more: Secondary | ICD-10-CM | POA: Diagnosis present

## 2021-07-14 DIAGNOSIS — Z7952 Long term (current) use of systemic steroids: Secondary | ICD-10-CM

## 2021-07-14 DIAGNOSIS — K709 Alcoholic liver disease, unspecified: Secondary | ICD-10-CM | POA: Diagnosis present

## 2021-07-14 DIAGNOSIS — I959 Hypotension, unspecified: Secondary | ICD-10-CM | POA: Diagnosis present

## 2021-07-14 HISTORY — DX: Alcoholic cirrhosis of liver with ascites: K70.31

## 2021-07-14 LAB — COMPREHENSIVE METABOLIC PANEL
ALT: 28 U/L (ref 0–44)
AST: 160 U/L — ABNORMAL HIGH (ref 15–41)
Albumin: 2 g/dL — ABNORMAL LOW (ref 3.5–5.0)
Alkaline Phosphatase: 127 U/L — ABNORMAL HIGH (ref 38–126)
Anion gap: 5 (ref 5–15)
BUN: <5 mg/dL — ABNORMAL LOW (ref 6–20)
CO2: 25 mmol/L (ref 22–32)
Calcium: 6.8 mg/dL — ABNORMAL LOW (ref 8.9–10.3)
Chloride: 102 mmol/L (ref 98–111)
Creatinine, Ser: 0.33 mg/dL — ABNORMAL LOW (ref 0.44–1.00)
GFR, Estimated: >60 mL/min (ref >60)
Glucose, Bld: 133 mg/dL — ABNORMAL HIGH (ref 70–99)
Potassium: 2.5 mmol/L — CL (ref 3.5–5.1)
Sodium: 132 mmol/L — ABNORMAL LOW (ref 135–145)
Total Bilirubin: 11.6 mg/dL — ABNORMAL HIGH (ref 0.3–1.2)
Total Protein: 6.9 g/dL (ref 6.5–8.1)

## 2021-07-14 LAB — CBC WITH DIFFERENTIAL/PLATELET
Abs Immature Granulocytes: 0.04 10*3/uL (ref 0.00–0.07)
Basophils Absolute: 0.1 10*3/uL (ref 0.0–0.1)
Basophils Relative: 1 %
Eosinophils Absolute: 0.2 10*3/uL (ref 0.0–0.5)
Eosinophils Relative: 2 %
HCT: 29 % — ABNORMAL LOW (ref 36.0–46.0)
Hemoglobin: 8.9 g/dL — ABNORMAL LOW (ref 12.0–15.0)
Immature Granulocytes: 0 %
Lymphocytes Relative: 37 %
Lymphs Abs: 3.6 10*3/uL (ref 0.7–4.0)
MCH: 27.9 pg (ref 26.0–34.0)
MCHC: 30.7 g/dL (ref 30.0–36.0)
MCV: 90.9 fL (ref 80.0–100.0)
Monocytes Absolute: 0.9 10*3/uL (ref 0.1–1.0)
Monocytes Relative: 10 %
Neutro Abs: 4.8 10*3/uL (ref 1.7–7.7)
Neutrophils Relative %: 50 %
Platelets: 65 10*3/uL — ABNORMAL LOW (ref 150–400)
RBC: 3.19 MIL/uL — ABNORMAL LOW (ref 3.87–5.11)
RDW: 28.2 % — ABNORMAL HIGH (ref 11.5–15.5)
WBC: 9.7 10*3/uL (ref 4.0–10.5)
nRBC: 0 % (ref 0.0–0.2)

## 2021-07-14 LAB — I-STAT BETA HCG BLOOD, ED (MC, WL, AP ONLY): I-stat hCG, quantitative: <5 m[IU]/mL (ref <5)

## 2021-07-14 LAB — LIPASE, BLOOD: Lipase: 41 U/L (ref 11–51)

## 2021-07-14 LAB — ETHANOL: Alcohol, Ethyl (B): 405 mg/dL (ref <10)

## 2021-07-14 NOTE — ED Provider Notes (Signed)
[facility] EMERGENCY DEPARTMENT Provider Note   CSN: [id number] Arrival date & time: 07/14/21  2211     History Chief Complaint  Patient presents with   Abdominal Pain    Victoria Gordon is a 40 y.o. female.  Patient is a 40 year old female with past medical history of alcohol-related cirrhosis, hepatitis B, polysubstance abuse, PTSD.  Patient presenting today for evaluation of abdominal pain and distention.  This has been worsening over the past week.  Patient recently admitted for a GI bleed.  Apparently she has been drinking alcohol again.  She denies fevers or chills.  The history is provided by the patient.  Abdominal Pain Pain location:  Generalized Pain quality: cramping   Pain radiates to:  Does not radiate Pain severity:  Moderate Onset quality:  Gradual Duration:  1 week Timing:  Constant Progression:  Worsening Chronicity:  New Context: alcohol use   Relieved by:  Nothing Worsened by:  Movement and palpation     Past Medical History:  Diagnosis Date   Alcohol abuse    Alcoholic cirrhosis of liver with ascites ([place])    Anemia    Anxiety    Hemorrhoid    Hepatitis B    Major depressive disorder    PTSD (post-traumatic stress disorder)    Substance abuse [facility])     Patient Active Problem List   Diagnosis Date Noted   Alcoholic hepatitis with ascites    Rectal bleeding    Anemia    Alcoholic cirrhosis of liver with ascites ([place])    Acute GI bleeding 06/05/2021   Ascites due to alcoholic cirrhosis ([place]) 0000000   Contusion of abdominal wall    DIC (disseminated intravascular coagulation) ([place]) 09/21/2017   Sepsis due to group B Streptococcus ([place]) 09/21/2017   Bacteremia due to group B Streptococcus 09/21/2017   Hyperammonemia ([place]) [id number]   Alcoholic intoxication without complication ([place])    Fall 09/20/2017   SIRS (systemic inflammatory response syndrome) ([place]) 09/20/2017   Intractable vomiting 09/20/2017   Thrombocytopenia ([place])  [id number]   Alcoholic liver disease ([place]) 09/20/2017   Low magnesium level    Hepatic steatosis 09/05/2016   Hiatal hernia 09/05/2016   Sepsis due to Escherichia coli (E. coli) ([place]) 09/03/2016   Pyelonephritis 09/03/2016   Elevated lactic acid level    Alcohol use disorder, severe, dependence ([place]) 01/30/2016   PTSD (post-traumatic stress disorder) 01/30/2016   MDD (major depressive disorder), recurrent severe, without psychosis ([place]) 01/30/2016   Hypokalemia 01/30/2016   Rubella non-immune status, antepartum 02/18/2014   UTI (urinary tract infection) in pregnancy in second trimester 02/17/2014    Past Surgical History:  Procedure Laterality Date   CESAREAN SECTION     CHOLECYSTECTOMY     MOUTH SURGERY     TUBAL LIGATION Bilateral 07/22/2014   Procedure: POST PARTUM TUBAL LIGATION;  Surgeon: Woodroe Mode, MD;  Location: [facility];  Service: Gynecology;  Laterality: Bilateral;     OB History     Gravida  7   Para  5   Term  5   Preterm  0   AB  2   Living  5      SAB  2   IAB  0   Ectopic  0   Multiple  0   Live Births  5           Family History  Problem Relation Age of Onset   Diabetes Maternal Grandmother    Diabetes Paternal Grandfather  Social History   Tobacco Use   Smoking status: Never   Smokeless tobacco: Never  Vaping Use   Vaping Use: Never used  Substance Use Topics   Alcohol use: Yes    Alcohol/week: 10.0 standard drinks    Types: 10 Cans of beer per week    Comment: drinks heavily   Drug use: No    Home Medications Prior to Admission medications   Medication Sig Start Date End Date Taking? Authorizing Provider  ascorbic acid (VITAMIN C) 500 MG tablet Take 1 tablet (500 mg total) by mouth daily. 06/12/21   Roxan Hockey, MD  chlordiazePOXIDE (LIBRIUM) 25 MG capsule '50mg'$  PO TID x 1D, then 25-'50mg'$  PO BID X 1D, then 25-'50mg'$  PO QD X 1D Patient not taking: No sig reported 04/04/18   Street, [place], PA-C  folic acid  (FOLVITE) 1 MG tablet Take 1 tablet (1 mg total) by mouth daily. 06/12/21   Roxan Hockey, MD  furosemide (LASIX) 20 MG tablet Take 1 tablet (20 mg total) by mouth daily. 06/11/21   Roxan Hockey, MD  hydrocortisone (ANUSOL-HC) 25 MG suppository Place 1 suppository (25 mg total) rectally every 12 (twelve) hours. 06/11/21 06/11/22  Roxan Hockey, MD  Multiple Vitamin (MULTIVITAMIN WITH MINERALS) TABS tablet Take 1 tablet by mouth daily. 06/12/21   Roxan Hockey, MD  pantoprazole (PROTONIX) 40 MG tablet Take 1 tablet (40 mg total) by mouth 2 (two) times daily. 06/11/21   Roxan Hockey, MD  potassium chloride (KLOR-CON) 10 MEQ tablet Take 1 tablet (10 mEq total) by mouth daily. Take While taking Lasix/furosemide 06/11/21   Roxan Hockey, MD  prednisoLONE 5 MG TABS tablet Take 8 tablets (40 mg total) by mouth daily. 06/12/21   Roxan Hockey, MD  propranolol (INDERAL) 20 MG tablet Take 1 tablet (20 mg total) by mouth 2 (two) times daily. 06/11/21   Roxan Hockey, MD  sertraline (ZOLOFT) 100 MG tablet Take 1 tablet (100 mg total) by mouth daily. For depression 06/11/21   Roxan Hockey, MD  spironolactone (ALDACTONE) 50 MG tablet Take 1 tablet (50 mg total) by mouth daily. 06/11/21   Roxan Hockey, MD  thiamine 100 MG tablet Take 1 tablet (100 mg total) by mouth daily. 06/12/21   Roxan Hockey, MD  zinc sulfate 220 (50 Zn) MG capsule Take 1 capsule (220 mg total) by mouth daily. 06/12/21   Roxan Hockey, MD    Allergies    Vitamin k and related and Vancomycin  Review of Systems   Review of Systems  Gastrointestinal:  Positive for abdominal pain.  All other systems reviewed and are negative.  Physical Exam Updated Vital Signs BP 113/83   Pulse 91   Temp 98 F (36.7 C) (Oral)   Resp 14   LMP 07/05/2021   SpO2 96%   Physical Exam Vitals and nursing note reviewed.  Constitutional:      General: She is not in acute distress.    Appearance: She is well-developed. She is  not diaphoretic.  HENT:     Head: Normocephalic and atraumatic.  Eyes:     General: Scleral icterus present.  Cardiovascular:     Rate and Rhythm: Normal rate and regular rhythm.     Heart sounds: No murmur heard.   No friction rub. No gallop.  Pulmonary:     Effort: Pulmonary effort is normal. No respiratory distress.     Breath sounds: Normal breath sounds. No wheezing.  Abdominal:     General: Bowel sounds are normal. There  is no distension.     Palpations: Abdomen is soft.     Tenderness: There is generalized abdominal tenderness.     Comments: There is generalized abdominal tenderness.  There is distention with fluid wave.  Musculoskeletal:        General: Normal range of motion.     Cervical back: Normal range of motion and neck supple.  Skin:    General: Skin is warm and dry.  Neurological:     General: No focal deficit present.     Mental Status: She is alert and oriented to person, place, and time.    ED Results / Procedures / Treatments   Labs (all labs ordered are listed, but only abnormal results are displayed) Labs Reviewed  COMPREHENSIVE METABOLIC PANEL - Abnormal; Notable for the following components:      Result Value   Sodium 132 (*)    Potassium 2.5 (*)    Glucose, Bld 133 (*)    BUN <5 (*)    Creatinine, Ser 0.33 (*)    Calcium 6.8 (*)    Albumin 2.0 (*)    AST 160 (*)    Alkaline Phosphatase 127 (*)    Total Bilirubin 11.6 (*)    All other components within normal limits  LIPASE, BLOOD  CBC WITH DIFFERENTIAL/PLATELET  URINALYSIS, ROUTINE W REFLEX MICROSCOPIC  ETHANOL  I-STAT BETA HCG BLOOD, ED ([facility], [facility], [facility] ONLY)    EKG EKG Interpretation  Date/Time:  Wednesday July 14 2021 22:46:36 EDT Ventricular Rate:  98 PR Interval:  165 QRS Duration: 113 QT Interval:  366 QTC Calculation: 446 R Axis:   -52 Text Interpretation: Sinus rhythm Ventricular bigeminy Left anterior fascicular block Low voltage, precordial leads Abnormal R-wave  progression, early transition Consider anterior infarct Baseline wander in lead(s) I II aVR Confirmed by Thamas Jaegers ([id number]) on 07/14/2021 10:49:21 PM  Radiology No results found.  Procedures Procedures   Medications Ordered in ED Medications - No data to display  ED Course  I have reviewed the triage vital signs and the nursing notes.  Pertinent labs & imaging results that were available during my care of the patient were reviewed by me and considered in my medical decision making (see chart for details).    MDM Rules/Calculators/A&P  Patient is a 40 year old female presenting with abdominal distention.  She has history of cirrhosis and appears to have massive ascites.  She has no fever or white count and this does not sound like peritonitis.  Laboratory studies do reveal a potassium of 2.5.  This was replaced with IV and oral medications.  I feel as though patient will require admission for correction of her potassium and likely paracentesis.  She was also found to have a blood alcohol of 405.  Final Clinical Impression(s) / ED Diagnoses Final diagnoses:  None    Rx / DC Orders ED Discharge Orders     None        Veryl Speak, MD 07/15/21 0157

## 2021-07-14 NOTE — ED Triage Notes (Signed)
Pt c/o abd distention with pain x one week.

## 2021-07-15 ENCOUNTER — Encounter (HOSPITAL_COMMUNITY): Payer: Self-pay | Admitting: Family Medicine

## 2021-07-15 ENCOUNTER — Observation Stay (HOSPITAL_COMMUNITY): Payer: Medicaid Other

## 2021-07-15 DIAGNOSIS — Z8619 Personal history of other infectious and parasitic diseases: Secondary | ICD-10-CM | POA: Diagnosis not present

## 2021-07-15 DIAGNOSIS — I959 Hypotension, unspecified: Secondary | ICD-10-CM | POA: Diagnosis present

## 2021-07-15 DIAGNOSIS — F4310 Post-traumatic stress disorder, unspecified: Secondary | ICD-10-CM | POA: Diagnosis present

## 2021-07-15 DIAGNOSIS — Z9049 Acquired absence of other specified parts of digestive tract: Secondary | ICD-10-CM | POA: Diagnosis not present

## 2021-07-15 DIAGNOSIS — E8809 Other disorders of plasma-protein metabolism, not elsewhere classified: Secondary | ICD-10-CM | POA: Diagnosis present

## 2021-07-15 DIAGNOSIS — N390 Urinary tract infection, site not specified: Secondary | ICD-10-CM | POA: Diagnosis present

## 2021-07-15 DIAGNOSIS — G909 Disorder of the autonomic nervous system, unspecified: Secondary | ICD-10-CM | POA: Diagnosis present

## 2021-07-15 DIAGNOSIS — F1020 Alcohol dependence, uncomplicated: Secondary | ICD-10-CM | POA: Diagnosis present

## 2021-07-15 DIAGNOSIS — E876 Hypokalemia: Secondary | ICD-10-CM

## 2021-07-15 DIAGNOSIS — I9589 Other hypotension: Secondary | ICD-10-CM | POA: Diagnosis not present

## 2021-07-15 DIAGNOSIS — R109 Unspecified abdominal pain: Secondary | ICD-10-CM | POA: Diagnosis present

## 2021-07-15 DIAGNOSIS — Z7952 Long term (current) use of systemic steroids: Secondary | ICD-10-CM | POA: Diagnosis not present

## 2021-07-15 DIAGNOSIS — F419 Anxiety disorder, unspecified: Secondary | ICD-10-CM | POA: Diagnosis present

## 2021-07-15 DIAGNOSIS — Z881 Allergy status to other antibiotic agents status: Secondary | ICD-10-CM | POA: Diagnosis not present

## 2021-07-15 DIAGNOSIS — Y908 Blood alcohol level of 240 mg/100 ml or more: Secondary | ICD-10-CM | POA: Diagnosis present

## 2021-07-15 DIAGNOSIS — Z20822 Contact with and (suspected) exposure to covid-19: Secondary | ICD-10-CM | POA: Diagnosis present

## 2021-07-15 DIAGNOSIS — D696 Thrombocytopenia, unspecified: Secondary | ICD-10-CM | POA: Diagnosis present

## 2021-07-15 DIAGNOSIS — Z8719 Personal history of other diseases of the digestive system: Secondary | ICD-10-CM | POA: Diagnosis not present

## 2021-07-15 DIAGNOSIS — K219 Gastro-esophageal reflux disease without esophagitis: Secondary | ICD-10-CM | POA: Diagnosis present

## 2021-07-15 DIAGNOSIS — F329 Major depressive disorder, single episode, unspecified: Secondary | ICD-10-CM | POA: Diagnosis present

## 2021-07-15 DIAGNOSIS — K709 Alcoholic liver disease, unspecified: Secondary | ICD-10-CM

## 2021-07-15 DIAGNOSIS — Z888 Allergy status to other drugs, medicaments and biological substances status: Secondary | ICD-10-CM | POA: Diagnosis not present

## 2021-07-15 DIAGNOSIS — Z79899 Other long term (current) drug therapy: Secondary | ICD-10-CM | POA: Diagnosis not present

## 2021-07-15 DIAGNOSIS — K7031 Alcoholic cirrhosis of liver with ascites: Secondary | ICD-10-CM | POA: Diagnosis present

## 2021-07-15 LAB — URINALYSIS, ROUTINE W REFLEX MICROSCOPIC
Bacteria, UA: NONE SEEN
Glucose, UA: 50 mg/dL — AB
Ketones, ur: NEGATIVE mg/dL
Nitrite: NEGATIVE
Protein, ur: 30 mg/dL — AB
Specific Gravity, Urine: 1.021 (ref 1.005–1.030)
pH: 6 (ref 5.0–8.0)

## 2021-07-15 LAB — COMPREHENSIVE METABOLIC PANEL
ALT: 27 U/L (ref 0–44)
AST: 146 U/L — ABNORMAL HIGH (ref 15–41)
Albumin: 1.7 g/dL — ABNORMAL LOW (ref 3.5–5.0)
Alkaline Phosphatase: 112 U/L (ref 38–126)
Anion gap: 4 — ABNORMAL LOW (ref 5–15)
BUN: <5 mg/dL — ABNORMAL LOW (ref 6–20)
CO2: 28 mmol/L (ref 22–32)
Calcium: 6.8 mg/dL — ABNORMAL LOW (ref 8.9–10.3)
Chloride: 103 mmol/L (ref 98–111)
Creatinine, Ser: 0.36 mg/dL — ABNORMAL LOW (ref 0.44–1.00)
GFR, Estimated: >60 mL/min (ref >60)
Glucose, Bld: 97 mg/dL (ref 70–99)
Potassium: 3 mmol/L — ABNORMAL LOW (ref 3.5–5.1)
Sodium: 135 mmol/L (ref 135–145)
Total Bilirubin: 10.7 mg/dL — ABNORMAL HIGH (ref 0.3–1.2)
Total Protein: 6.2 g/dL — ABNORMAL LOW (ref 6.5–8.1)

## 2021-07-15 LAB — CBC WITH DIFFERENTIAL/PLATELET
Abs Immature Granulocytes: 0.02 10*3/uL (ref 0.00–0.07)
Basophils Absolute: 0 10*3/uL (ref 0.0–0.1)
Basophils Relative: 1 %
Eosinophils Absolute: 0.1 10*3/uL (ref 0.0–0.5)
Eosinophils Relative: 2 %
HCT: 26.3 % — ABNORMAL LOW (ref 36.0–46.0)
Hemoglobin: 8.1 g/dL — ABNORMAL LOW (ref 12.0–15.0)
Immature Granulocytes: 0 %
Lymphocytes Relative: 41 %
Lymphs Abs: 2.6 10*3/uL (ref 0.7–4.0)
MCH: 28.6 pg (ref 26.0–34.0)
MCHC: 30.8 g/dL (ref 30.0–36.0)
MCV: 92.9 fL (ref 80.0–100.0)
Monocytes Absolute: 0.6 10*3/uL (ref 0.1–1.0)
Monocytes Relative: 9 %
Neutro Abs: 3 10*3/uL (ref 1.7–7.7)
Neutrophils Relative %: 47 %
Platelets: 50 10*3/uL — ABNORMAL LOW (ref 150–400)
RBC: 2.83 MIL/uL — ABNORMAL LOW (ref 3.87–5.11)
RDW: 28.1 % — ABNORMAL HIGH (ref 11.5–15.5)
WBC: 6.4 10*3/uL (ref 4.0–10.5)
nRBC: 0 % (ref 0.0–0.2)

## 2021-07-15 LAB — BASIC METABOLIC PANEL
Anion gap: 6 (ref 5–15)
BUN: 5 mg/dL — ABNORMAL LOW (ref 6–20)
CO2: 23 mmol/L (ref 22–32)
Calcium: 6.8 mg/dL — ABNORMAL LOW (ref 8.9–10.3)
Chloride: 105 mmol/L (ref 98–111)
Creatinine, Ser: <0.3 mg/dL — ABNORMAL LOW (ref 0.44–1.00)
Glucose, Bld: 99 mg/dL (ref 70–99)
Potassium: 3.4 mmol/L — ABNORMAL LOW (ref 3.5–5.1)
Sodium: 134 mmol/L — ABNORMAL LOW (ref 135–145)

## 2021-07-15 LAB — RESP PANEL BY RT-PCR (FLU A&B, COVID) ARPGX2
Influenza A by PCR: NEGATIVE
Influenza B by PCR: NEGATIVE
SARS Coronavirus 2 by RT PCR: NEGATIVE

## 2021-07-15 LAB — MAGNESIUM
Magnesium: 1.9 mg/dL (ref 1.7–2.4)
Magnesium: 1.8 mg/dL (ref 1.7–2.4)

## 2021-07-15 MED ORDER — ONDANSETRON HCL 4 MG PO TABS: 4.0000 mg | ORAL_TABLET | Freq: Four times a day (QID) | ORAL | Status: DC | PRN

## 2021-07-15 MED ORDER — LACTATED RINGERS IV BOLUS
250.0000 mL | Freq: Once | INTRAVENOUS | Status: AC
  Administered 2021-07-15: 250 mL via INTRAVENOUS

## 2021-07-15 MED ORDER — ONDANSETRON HCL 4 MG/2ML IJ SOLN
4.0000 mg | Freq: Four times a day (QID) | INTRAMUSCULAR | Status: DC | PRN
  Administered 2021-07-15: 4 mg via INTRAVENOUS
  Filled 2021-07-15: qty 2

## 2021-07-15 MED ORDER — LORAZEPAM 1 MG PO TABS
0.0000 mg | ORAL_TABLET | Freq: Four times a day (QID) | ORAL | Status: DC
Start: 2021-07-15 — End: 2021-07-17

## 2021-07-15 MED ORDER — PANTOPRAZOLE SODIUM 40 MG PO TBEC: 40.0000 mg | DELAYED_RELEASE_TABLET | Freq: Two times a day (BID) | ORAL | Status: DC

## 2021-07-15 MED ORDER — PROPRANOLOL HCL 20 MG PO TABS
20.0000 mg | ORAL_TABLET | Freq: Two times a day (BID) | ORAL | Status: DC
  Administered 2021-07-15 – 2021-07-17 (×5): 20 mg via ORAL
  Filled 2021-07-15 (×4): qty 1
  Filled 2021-07-15: qty 2

## 2021-07-15 MED ORDER — ENSURE ENLIVE PO LIQD
237.0000 mL | Freq: Two times a day (BID) | ORAL | Status: DC
  Administered 2021-07-16 – 2021-07-18 (×6): 237 mL via ORAL

## 2021-07-15 MED ORDER — FUROSEMIDE 40 MG PO TABS
20.0000 mg | ORAL_TABLET | Freq: Every day | ORAL | Status: DC
  Administered 2021-07-15: 20 mg via ORAL
  Filled 2021-07-15: qty 1

## 2021-07-15 MED ORDER — CHLORHEXIDINE GLUCONATE CLOTH 2 % EX PADS
6.0000 | MEDICATED_PAD | Freq: Every day | CUTANEOUS | Status: DC
  Administered 2021-07-15 – 2021-07-18 (×4): 6 via TOPICAL

## 2021-07-15 MED ORDER — FOLIC ACID 1 MG PO TABS
1.0000 mg | ORAL_TABLET | Freq: Every day | ORAL | Status: DC
  Administered 2021-07-15 – 2021-07-18 (×4): 1 mg via ORAL
  Filled 2021-07-15 (×4): qty 1

## 2021-07-15 MED ORDER — HEPARIN SODIUM (PORCINE) 5000 UNIT/ML IJ SOLN
5000.0000 [IU] | Freq: Three times a day (TID) | INTRAMUSCULAR | Status: DC
  Administered 2021-07-15 – 2021-07-16 (×2): 5000 [IU] via SUBCUTANEOUS
  Filled 2021-07-15 (×2): qty 1

## 2021-07-15 MED ORDER — SODIUM CHLORIDE 0.9 % IV SOLN
1.0000 g | INTRAVENOUS | Status: DC
  Administered 2021-07-15 – 2021-07-18 (×4): 1 g via INTRAVENOUS
  Filled 2021-07-15 (×4): qty 10

## 2021-07-15 MED ORDER — ADULT MULTIVITAMIN W/MINERALS CH
1.0000 | ORAL_TABLET | Freq: Every day | ORAL | Status: DC
  Administered 2021-07-15 – 2021-07-18 (×4): 1 via ORAL
  Filled 2021-07-15 (×4): qty 1

## 2021-07-15 MED ORDER — ZINC SULFATE 220 (50 ZN) MG PO CAPS
220.0000 mg | ORAL_CAPSULE | Freq: Every day | ORAL | Status: DC
  Administered 2021-07-15 – 2021-07-18 (×4): 220 mg via ORAL
  Filled 2021-07-15 (×4): qty 1

## 2021-07-15 MED ORDER — LORAZEPAM 2 MG/ML IJ SOLN
1.0000 mg | INTRAMUSCULAR | Status: DC | PRN
  Administered 2021-07-15: 1 mg via INTRAVENOUS
  Filled 2021-07-15: qty 1

## 2021-07-15 MED ORDER — IBUPROFEN 400 MG PO TABS
400.0000 mg | ORAL_TABLET | Freq: Four times a day (QID) | ORAL | Status: DC | PRN
  Administered 2021-07-17: 400 mg via ORAL
  Filled 2021-07-15: qty 1

## 2021-07-15 MED ORDER — CALCIUM GLUCONATE-NACL 1-0.675 GM/50ML-% IV SOLN
1.0000 g | Freq: Once | INTRAVENOUS | Status: AC
  Administered 2021-07-15: 1000 mg via INTRAVENOUS
  Filled 2021-07-15: qty 50

## 2021-07-15 MED ORDER — PANTOPRAZOLE SODIUM 40 MG PO TBEC
40.0000 mg | DELAYED_RELEASE_TABLET | Freq: Every day | ORAL | Status: DC
  Administered 2021-07-15 – 2021-07-18 (×4): 40 mg via ORAL
  Filled 2021-07-15 (×5): qty 1

## 2021-07-15 MED ORDER — POTASSIUM CHLORIDE 10 MEQ/100ML IV SOLN
10.0000 meq | INTRAVENOUS | Status: AC
Start: 2021-07-15 — End: 2021-07-15
  Administered 2021-07-15 (×4): 10 meq via INTRAVENOUS
  Filled 2021-07-15 (×4): qty 100

## 2021-07-15 MED ORDER — THIAMINE HCL 100 MG PO TABS
100.0000 mg | ORAL_TABLET | Freq: Every day | ORAL | Status: DC
  Administered 2021-07-15 – 2021-07-18 (×4): 100 mg via ORAL
  Filled 2021-07-15 (×4): qty 1

## 2021-07-15 MED ORDER — ALBUMIN HUMAN 25 % IV SOLN
50.0000 g | Freq: Four times a day (QID) | INTRAVENOUS | Status: DC
Start: 2021-07-15 — End: 2021-07-15
  Administered 2021-07-15: 50 g via INTRAVENOUS
  Filled 2021-07-15 (×2): qty 200

## 2021-07-15 MED ORDER — LORAZEPAM 1 MG PO TABS: 0.0000 mg | ORAL_TABLET | Freq: Two times a day (BID) | ORAL | Status: DC

## 2021-07-15 MED ORDER — POTASSIUM CHLORIDE CRYS ER 20 MEQ PO TBCR
40.0000 meq | EXTENDED_RELEASE_TABLET | Freq: Once | ORAL | Status: AC
  Administered 2021-07-15: 40 meq via ORAL
  Filled 2021-07-15: qty 2

## 2021-07-15 MED ORDER — LORAZEPAM 1 MG PO TABS: 1.0000 mg | ORAL_TABLET | ORAL | Status: AC | PRN

## 2021-07-15 MED ORDER — MAGNESIUM SULFATE 2 GM/50ML IV SOLN
2.0000 g | Freq: Once | INTRAVENOUS | Status: AC
  Administered 2021-07-15: 2 g via INTRAVENOUS
  Filled 2021-07-15: qty 50

## 2021-07-15 MED ORDER — SERTRALINE HCL 50 MG PO TABS
100.0000 mg | ORAL_TABLET | Freq: Every day | ORAL | Status: DC
Start: 2021-07-15 — End: 2021-07-18
  Administered 2021-07-15 – 2021-07-18 (×4): 100 mg via ORAL
  Filled 2021-07-15 (×4): qty 2

## 2021-07-15 MED ORDER — THIAMINE HCL 100 MG/ML IJ SOLN: 100.0000 mg | Freq: Every day | INTRAMUSCULAR | Status: DC

## 2021-07-15 MED ORDER — ALBUMIN HUMAN 25 % IV SOLN
INTRAVENOUS | Status: AC
  Filled 2021-07-15: qty 200

## 2021-07-15 MED ORDER — ALBUMIN HUMAN 25 % IV SOLN
INTRAVENOUS | Status: AC
  Administered 2021-07-15: 50 g via INTRAVENOUS
  Filled 2021-07-15: qty 50

## 2021-07-15 MED ORDER — HEPARIN SODIUM (PORCINE) 5000 UNIT/ML IJ SOLN
5000.0000 [IU] | Freq: Three times a day (TID) | INTRAMUSCULAR | Status: DC
  Administered 2021-07-15: 5000 [IU] via SUBCUTANEOUS
  Filled 2021-07-15: qty 1

## 2021-07-15 MED ORDER — OXYCODONE HCL 5 MG PO TABS
5.0000 mg | ORAL_TABLET | ORAL | Status: DC | PRN
  Administered 2021-07-16 – 2021-07-18 (×4): 5 mg via ORAL
  Filled 2021-07-15 (×4): qty 1

## 2021-07-15 MED ORDER — POTASSIUM CHLORIDE 10 MEQ/100ML IV SOLN
10.0000 meq | Freq: Once | INTRAVENOUS | Status: AC
  Administered 2021-07-15: 10 meq via INTRAVENOUS
  Filled 2021-07-15: qty 100

## 2021-07-15 MED ORDER — FUROSEMIDE 40 MG PO TABS
40.0000 mg | ORAL_TABLET | Freq: Two times a day (BID) | ORAL | Status: DC
  Administered 2021-07-15 – 2021-07-18 (×6): 40 mg via ORAL
  Filled 2021-07-15 (×2): qty 1
  Filled 2021-07-15: qty 2
  Filled 2021-07-15 (×4): qty 1

## 2021-07-15 MED ORDER — ALBUMIN HUMAN 25 % IV SOLN
50.0000 g | Freq: Four times a day (QID) | INTRAVENOUS | Status: AC
  Administered 2021-07-15 (×2): 50 g via INTRAVENOUS
  Filled 2021-07-15 (×2): qty 200

## 2021-07-15 MED ORDER — SPIRONOLACTONE 25 MG PO TABS
50.0000 mg | ORAL_TABLET | Freq: Every day | ORAL | Status: DC
  Administered 2021-07-15 – 2021-07-17 (×3): 50 mg via ORAL
  Filled 2021-07-15 (×3): qty 2

## 2021-07-15 NOTE — Plan of Care (Signed)
  Problem: Education: Goal: Knowledge of General Education information will improve Description: Including pain rating scale, medication(s)/side effects and non-pharmacologic comfort measures Outcome: Progressing   Problem: Clinical Measurements: Goal: Diagnostic test results will improve Outcome: Progressing   Problem: Nutrition: Goal: Adequate nutrition will be maintained Outcome: Progressing   

## 2021-07-15 NOTE — ED Notes (Signed)
Pt given meal tray and ginger ale 

## 2021-07-15 NOTE — Progress Notes (Signed)
PROGRESS NOTE    Patient: Victoria Gordon                            PCP: Patient, [name]                    DOB: 10-27-1981            DOA: 07/14/2021 [id number]             DOS: 07/15/2021, 10:53 AM   LOS: 0 days   Date of Service: The patient was seen and examined on 07/15/2021  Subjective:   The patient was seen and examined this morning. Stable at this time. Still complaining of : Abdominal pain, distention Otherwise no issues overnight .  Brief Narrative:   Victoria Gordon  is a 40 y.o. female, with history of alcohol abuse, alcoholic cirrhosis, major depressive disorder, substance abuse-denied, hepatitis B-denied any more presents the ED with a chief complaint of abdominal pain and distention.   Was found intoxicated in ED. reporting she has lapsed her last binge drinking was last month. Report abdominal distention, progressively getting worse over 1 week.  Had a paracentesis in August for first time.    Patient reports that she does not smoke, does not use illicit drugs.  Patient does drink alcohol.  Patient vaccinated for COVID.  She also had COVID this year.   ED: Hemodynamically stable, No leukocytosis with a white blood cell count 9.7, hemoglobin 8.9, platelets 65 Chemistry reveals a hypokalemia at 2.5, calcium was 6.8 that corrects to 8.4 with an albumin of 2.0, lipase 41, AST 160, ALT 28 Alcohol level is 405 EKG shows a heart rate of 98, QTc 446, sinus rhythm with bigeminy  potassium 50 mEq total given in the ED    Assessment & Plan:   Active Problems:   Alcohol use disorder, severe, dependence (HCC)   Hypokalemia   Thrombocytopenia (HCC)   Alcoholic liver disease (HCC)   Active Problems:   Alcohol use disorder, severe, dependence (HCC) -Continue CIWA protocol -No signs of withdrawal -As advised of alcohol use abuse, importance of alcohol use cessation -Patient stating she relapsed, trying to quit  severe hypokalemia -On  admission potassium 2.5, >>> 3.0  -Patient's potassium was repleted by p.o. in ED, 50, 40 mEq  -We will continue to monitor, repleting again orally, check a magnesium  Ascites due to chronic alcohol liver disease  -Paracentesis ordered, status post paracentesis yielding 5.3 L -Pending lab analysis  Hypoalbuminemia, -  repleting  Thrombocytopenia (HCC) likely due to chronic alcohol disease, liver disease, cirrhosis -Monitoring platelets, no signs of bleeding -Platelets 65, 50   Urine tract infection  -We will follow with urine culture -Initiating IV Rocephin  alcoholic liver disease ([place]) -Monitoring LFTs, advised against alcohol use  --------------------------------------------------------------------------------------------------------------------------------- Cultures; Paracentesis-body fluid culture >>  Urine Culture  >>>    Antimicrobials: IV Rocephin   Consultants: None  ---------------------------------------------------------------------------------------------------------------------------------  DVT prophylaxis:  SCD/Compression stockings Code Status:   Code Status: Full Code  Family Communication:  No family member present at bedside- attempt will be made to update daily The above findings and plan of care has been discussed with patient (and family)  in detail,  they expressed understanding and agreement of above. -Advance care planning has been discussed.   Admission status:   Status is: Observation  The patient remains OBS appropriate and will d/c before 2 midnights.  Dispo:  The patient is from: Home              Anticipated d/c is to: Home              Patient currently is not medically stable to d/c.   Difficult to place patient No  Level of care: Telemetry   Procedures:   No admission procedures for hospital encounter.    Antimicrobials:  Anti-infectives (From admission, onward)    Start     Dose/Rate Route Frequency Ordered Stop    07/15/21 0745  cefTRIAXone (ROCEPHIN) 1 g in sodium chloride 0.9 % 100 mL IVPB        1 g 200 mL/hr over 30 Minutes Intravenous Every 24 hours 07/15/21 0744          Medication:   feeding supplement  237 mL Oral BID BM   folic acid  1 mg Oral Daily   furosemide  20 mg Oral Daily   heparin  5,000 Units Subcutaneous Q8H   LORazepam  0-4 mg Oral Q6H   Followed by   Derrill Memo ON 07/17/2021] LORazepam  0-4 mg Oral Q12H   multivitamin with minerals  1 tablet Oral Daily   pantoprazole  40 mg Oral Daily   propranolol  20 mg Oral BID   sertraline  100 mg Oral Daily   spironolactone  50 mg Oral Daily   thiamine  100 mg Oral Daily   Or   thiamine  100 mg Intravenous Daily   zinc sulfate  220 mg Oral Daily    ibuprofen, LORazepam **OR** LORazepam, ondansetron **OR** ondansetron (ZOFRAN) IV, oxyCODONE   Objective:   Vitals:   07/15/21 0953 07/15/21 1000 07/15/21 1015 07/15/21 1030  BP: 105/69 (!) 94/52 99/62 (!) 147/89  Pulse: 70 65 67 71  Resp: (!) 22 (!) 22 18 (!) 22  Temp:      TempSrc:      SpO2: 99% 98% 100% 98%    Intake/Output Summary (Last 24 hours) at 07/15/2021 1053 Last data filed at 07/15/2021 1037 Gross per 24 hour  Intake 400 ml  Output --  Net 400 ml   There were no vitals filed for this visit.   Examination:   Physical Exam  Constitution:  Alert, cooperative, no distress,  Appears calm and comfortable  Psychiatric: Normal and stable mood and affect, cognition intact,   HEENT: Normocephalic, PERRL, otherwise with in Normal limits  Chest:Chest symmetric Cardio vascular:  S1/S2, RRR, No murmure, No Rubs or Gallops  pulmonary: Clear to auscultation bilaterally, respirations unlabored, negative wheezes / crackles Abdomen: Soft, non-tender, ++ distended, bowel sounds,no masses, no organomegaly Muscular skeletal: Limited exam - in bed, able to move all 4 extremities, Normal strength,  Neuro: CNII-XII intact. , normal motor and sensation, reflexes intact   Extremities: No pitting edema lower extremities, +2 pulses  Skin: Dry, warm to touch, negative for any Rashes, No open wounds Wounds: per nursing documentation    ------------------------------------------------------------------------------------------------------------------------------------------    LABs:  CBC Latest Ref Rng & Units 07/15/2021 07/14/2021 06/11/2021  WBC 4.0 - 10.5 K/uL 6.4 9.7 7.1  Hemoglobin 12.0 - 15.0 g/dL 8.1(L) 8.9(L) 8.3(L)  Hematocrit 36.0 - 46.0 % 26.3(L) 29.0(L) 27.4(L)  Platelets 150 - 400 K/uL 50(L) 65(L) 68(L)   CMP Latest Ref Rng & Units 07/15/2021 07/14/2021 06/11/2021  Glucose 70 - 99 mg/dL 97 133(H) 144(H)  BUN 6 - 20 mg/dL <5(L) <5(L) 6  Creatinine 0.44 - 1.00 mg/dL 0.36(L) 0.33(L) <0.30(L)  Sodium 135 -  145 mmol/L 135 132(L) 134(L)  Potassium 3.5 - 5.1 mmol/L 3.0(L) 2.5(LL) 4.2  Chloride 98 - 111 mmol/L 103 102 107  CO2 22 - 32 mmol/L '28 25 23  '$ Calcium 8.9 - 10.3 mg/dL 6.8(L) 6.8(L) 8.2(L)  Total Protein 6.5 - 8.1 g/dL 6.2(L) 6.9 5.6(L)  Total Bilirubin 0.3 - 1.2 mg/dL 10.7(H) 11.6(H) 5.3(H)  Alkaline Phos 38 - 126 U/L 112 127(H) 142(H)  AST 15 - 41 U/L 146(H) 160(H) 102(H)  ALT 0 - 44 U/L '27 28 27       '$ Micro Results Recent Results (from the past 240 hour(s))  Resp Panel by RT-PCR (Flu A&B, Covid) Nasopharyngeal Swab     Status: None   Collection Time: 07/15/21  1:58 AM   Specimen: Nasopharyngeal Swab; Nasopharyngeal(NP) swabs in vial transport medium  Result Value Ref Range Status   SARS Coronavirus 2 by RT PCR NEGATIVE NEGATIVE Final    Comment: (NOTE) SARS-CoV-2 target nucleic acids are NOT DETECTED.  The SARS-CoV-2 RNA is generally detectable in upper respiratory specimens during the acute phase of infection. The lowest concentration of SARS-CoV-2 viral copies this assay can detect is 138 copies/mL. A negative result does not preclude SARS-Cov-2 infection and should not be used as the sole basis for treatment or other patient  management decisions. A negative result may occur with  improper specimen collection/handling, submission of specimen other than nasopharyngeal swab, presence of viral mutation(s) within the areas targeted by this assay, and inadequate number of viral copies(<138 copies/mL). A negative result must be combined with clinical observations, patient history, and epidemiological information. The expected result is Negative.  Fact Sheet for Patients:  EntrepreneurPulse.com.au  Fact Sheet for Healthcare Providers:  IncredibleEmployment.be  This test is no t yet approved or cleared by the [place] FDA and  has been authorized for detection and/or diagnosis of SARS-CoV-2 by FDA under an Emergency Use Authorization (EUA). This EUA will remain  in effect (meaning this test can be used) for the duration of the COVID-19 declaration under Section 564(b)(1) of the Act, 21 U.S.C.section 360bbb-3(b)(1), unless the authorization is terminated  or revoked sooner.       Influenza A by PCR NEGATIVE NEGATIVE Final   Influenza B by PCR NEGATIVE NEGATIVE Final    Comment: (NOTE) The Xpert Xpress SARS-CoV-2/FLU/RSV plus assay is intended as an aid in the diagnosis of influenza from Nasopharyngeal swab specimens and should not be used as a sole basis for treatment. Nasal washings and aspirates are unacceptable for Xpert Xpress SARS-CoV-2/FLU/RSV testing.  Fact Sheet for Patients: EntrepreneurPulse.com.au  Fact Sheet for Healthcare Providers: IncredibleEmployment.be  This test is not yet approved or cleared by the [place] FDA and has been authorized for detection and/or diagnosis of SARS-CoV-2 by FDA under an Emergency Use Authorization (EUA). This EUA will remain in effect (meaning this test can be used) for the duration of the COVID-19 declaration under Section 564(b)(1) of the Act, 21 U.S.C. section 360bbb-3(b)(1),  unless the authorization is terminated or revoked.  Performed at [facility], [address], [place], [place] [address]     Radiology Reports No results found.  SIGNED: Deatra James, MD, FHM. [facility],  Pager (please use [email or web address] to page/text) Please use Epic Secure Chat for non-urgent communication (7AM-7PM)  If 7PM-7AM, please contact night-coverage [email or web address], 07/15/2021, 10:53 AM

## 2021-07-15 NOTE — H&P (Signed)
TRH H&P    Patient Demographics:    Victoria Gordon, is a 40 y.o. female  MRN: [id number]  DOB - 1981-04-29  Admit Date - 07/14/2021  Referring MD/NP/PA: Stark Jock  Outpatient Primary MD for the patient is Patient, [name]  Patient coming from: home  Chief complaint- abdominal pain and distention   HPI:    Victoria Gordon  is a 40 y.o. female, with history of alcohol abuse, alcoholic cirrhosis, major depressive disorder, substance abuse-denied, hepatitis B-denied any more presents the ED with a chief complaint of abdominal pain and distention.  Of note history is limited as patient is still intoxicated.  At first she cannot remember why she is here in the ED.  When asked if she had belly pain she said that she did not.  It felt like a pressure.  She has noticed bloating building up over 1 week.  Became acutely worse today.  It has not affected her appetite.  She reports her last normal meal was today.  Her last normal bowel movement was this morning.  She has not had any nausea or vomiting.  She has had paracentesis in the past and the last one was in August.  Patient reports that she is no longer drinking alcohol, with an alcohol level greater than 400.  She reports that she did quit drinking, and then she has relapses where she drinks a lot.  It sounds like binge drinking.  Patient reports that up until today (even though the bloating started 1 week ago)  she has been in her normal state of health.  Patient reports that she does not smoke, does not use illicit drugs.  Patient does drink alcohol.  Patient vaccinated for COVID.  She also had COVID this year.  In the ED  temp 98, heart rate 86-1 01, respiratory rate 14-34, blood pressure 114/84, satting at 95% No leukocytosis with a white blood cell count 9.7, hemoglobin 8.9, platelets 65 Chemistry reveals a hypokalemia at 2.5, calcium was 6.8  that corrects to 8.4 with an albumin of 2.0, lipase 41, AST 160, ALT 28 Alcohol level is 405 EKG shows a heart rate of 98, QTc 446, sinus rhythm with bigeminy UA pending  potassium 50 mEq total given in the ED    Review of systems:    In addition to the HPI above,  No Fever-chills, No Headache, No changes with Vision or hearing, No problems swallowing food or Liquids, No Chest pain, Cough or Shortness of Breath, No Blood in stool or Urine, No dysuria, No new skin rashes or bruises, No new joints pains-aches,  No new weakness, tingling, numbness in any extremity, No recent weight gain or loss, No polyuria, polydypsia or polyphagia, No significant Mental Stressors.  All other systems reviewed and are negative.    Past History of the following :    Past Medical History:  Diagnosis Date   Alcohol abuse    Alcoholic cirrhosis of liver with ascites ([place])    Anemia    Anxiety  Hemorrhoid    Hepatitis B    Major depressive disorder    PTSD (post-traumatic stress disorder)    Substance abuse ([place])       Past Surgical History:  Procedure Laterality Date   CESAREAN SECTION     CHOLECYSTECTOMY     MOUTH SURGERY     TUBAL LIGATION Bilateral 07/22/2014   Procedure: POST PARTUM TUBAL LIGATION;  Surgeon: Woodroe Mode, MD;  Location: [facility];  Service: Gynecology;  Laterality: Bilateral;      Social History:      Social History   Tobacco Use   Smoking status: Never   Smokeless tobacco: Never  Substance Use Topics   Alcohol use: Yes    Alcohol/week: 10.0 standard drinks    Types: 10 Cans of beer per week    Comment: drinks heavily       Family History :     Family History  Problem Relation Age of Onset   Diabetes Maternal Grandmother    Diabetes Paternal Grandfather       Home Medications:   Prior to Admission medications   Medication Sig Start Date End Date Taking? Authorizing Provider  ascorbic acid (VITAMIN C) 500 MG tablet Take 1 tablet (500 mg  total) by mouth daily. 06/12/21   Roxan Hockey, MD  chlordiazePOXIDE (LIBRIUM) 25 MG capsule '50mg'$  PO TID x 1D, then 25-'50mg'$  PO BID X 1D, then 25-'50mg'$  PO QD X 1D Patient not taking: No sig reported 04/04/18   Street, [place], PA-C  folic acid (FOLVITE) 1 MG tablet Take 1 tablet (1 mg total) by mouth daily. 06/12/21   Roxan Hockey, MD  furosemide (LASIX) 20 MG tablet Take 1 tablet (20 mg total) by mouth daily. 06/11/21   Roxan Hockey, MD  hydrocortisone (ANUSOL-HC) 25 MG suppository Place 1 suppository (25 mg total) rectally every 12 (twelve) hours. 06/11/21 06/11/22  Roxan Hockey, MD  Multiple Vitamin (MULTIVITAMIN WITH MINERALS) TABS tablet Take 1 tablet by mouth daily. 06/12/21   Roxan Hockey, MD  pantoprazole (PROTONIX) 40 MG tablet Take 1 tablet (40 mg total) by mouth 2 (two) times daily. 06/11/21   Roxan Hockey, MD  potassium chloride (KLOR-CON) 10 MEQ tablet Take 1 tablet (10 mEq total) by mouth daily. Take While taking Lasix/furosemide 06/11/21   Roxan Hockey, MD  prednisoLONE 5 MG TABS tablet Take 8 tablets (40 mg total) by mouth daily. 06/12/21   Roxan Hockey, MD  propranolol (INDERAL) 20 MG tablet Take 1 tablet (20 mg total) by mouth 2 (two) times daily. 06/11/21   Roxan Hockey, MD  sertraline (ZOLOFT) 100 MG tablet Take 1 tablet (100 mg total) by mouth daily. For depression 06/11/21   Roxan Hockey, MD  spironolactone (ALDACTONE) 50 MG tablet Take 1 tablet (50 mg total) by mouth daily. 06/11/21   Roxan Hockey, MD  thiamine 100 MG tablet Take 1 tablet (100 mg total) by mouth daily. 06/12/21   Roxan Hockey, MD  zinc sulfate 220 (50 Zn) MG capsule Take 1 capsule (220 mg total) by mouth daily. 06/12/21   Roxan Hockey, MD     Allergies:     Allergies  Allergen Reactions   Vitamin K And Related Hives, Shortness Of Breath, Itching and Rash   Vancomycin Hives and Itching     Physical Exam:   Vitals  Blood pressure 114/80, pulse 92, temperature 98 F  (36.7 C), temperature source Oral, resp. rate 18, last menstrual period 07/05/2021, SpO2 96 %.  1.  General: Patient lying  supine in bed,  no acute distress   2. Psychiatric: Drowsy and oriented x 3, mood and behavior normal for situation, pleasant and cooperative with exam   3. Neurologic: Speech and language are normal, face is symmetric, moves all 4 extremities voluntarily, at baseline without acute deficits on limited exam   4. HEENMT:  Head is atraumatic, normocephalic, pupils reactive to light, neck is supple, trachea is midline, mucous membranes are moist   5. Respiratory : Lungs are clear to auscultation bilaterally without wheezing, rhonchi, rales, no cyanosis, no increase in work of breathing or accessory muscle use   6. Cardiovascular : Heart rate normal, rhythm is regular, systolic murmur present, rubs or gallops, peripheral pulses palpated   7. Gastrointestinal:  Abdomen is soft, significant abdominal distention with some pitting edema, nontender to palpation bowel sounds active, no masses or organomegaly palpated   8. Skin:  Skin is warm, dry and intact without rashes, acute lesions, or ulcers on limited exam   9.Musculoskeletal:  No acute deformities or trauma, no asymmetry in tone, edema present, peripheral pulses palpated, no tenderness to palpation in the extremities     Data Review:    CBC Recent Labs  Lab 07/14/21 2250  WBC 9.7  HGB 8.9*  HCT 29.0*  PLT 65*  MCV 90.9  MCH 27.9  MCHC 30.7  RDW 28.2*  LYMPHSABS 3.6  MONOABS 0.9  EOSABS 0.2  BASOSABS 0.1   ------------------------------------------------------------------------------------------------------------------  Results for orders placed or performed during the hospital encounter of 07/14/21 (from the past 48 hour(s))  I-Stat beta hCG blood, ED     Status: None   Collection Time: 07/14/21 10:48 PM  Result Value Ref Range   I-stat hCG, quantitative <5.0 <5 mIU/mL   Comment 3             Comment:   GEST. AGE      CONC.  (mIU/mL)   <=1 WEEK        5 - 50     2 WEEKS       50 - 500     3 WEEKS       100 - 10,000     4 WEEKS     1,000 - 30,000        FEMALE AND NON-PREGNANT FEMALE:     LESS THAN 5 mIU/mL   CBC with Differential     Status: Abnormal   Collection Time: 07/14/21 10:50 PM  Result Value Ref Range   WBC 9.7 4.0 - 10.5 K/uL   RBC 3.19 (L) 3.87 - 5.11 MIL/uL   Hemoglobin 8.9 (L) 12.0 - 15.0 g/dL   HCT 29.0 (L) 36.0 - 46.0 %   MCV 90.9 80.0 - 100.0 fL   MCH 27.9 26.0 - 34.0 pg   MCHC 30.7 30.0 - 36.0 g/dL   RDW 28.2 (H) 11.5 - 15.5 %   Platelets 65 (L) 150 - 400 K/uL    Comment: SPECIMEN CHECKED FOR CLOTS Immature Platelet Fraction may be clinically indicated, consider ordering this additional test [id number] PLATELET COUNT CONFIRMED BY SMEAR    nRBC 0.0 0.0 - 0.2 %   Neutrophils Relative % 50 %   Neutro Abs 4.8 1.7 - 7.7 K/uL   Lymphocytes Relative 37 %   Lymphs Abs 3.6 0.7 - 4.0 K/uL   Monocytes Relative 10 %   Monocytes Absolute 0.9 0.1 - 1.0 K/uL   Eosinophils Relative 2 %   Eosinophils Absolute 0.2 0.0 - 0.5 K/uL  Basophils Relative 1 %   Basophils Absolute 0.1 0.0 - 0.1 K/uL   WBC Morphology TOXIC GRANULATION     Comment: VACUOLATED NEUTROPHILS   Immature Granulocytes 0 %   Abs Immature Granulocytes 0.04 0.00 - 0.07 K/uL   Polychromasia PRESENT     Comment: Performed at [facility], [address] [place], [place] [address]  Comprehensive metabolic panel     Status: Abnormal   Collection Time: 07/14/21 10:50 PM  Result Value Ref Range   Sodium 132 (L) 135 - 145 mmol/L   Potassium 2.5 (LL) 3.5 - 5.1 mmol/L    Comment: CRITICAL RESULT CALLED TO, READ BACK BY AND VERIFIED WITH: BELTON,C ON 07/14/21 AT 2330 BY LOY,C    Chloride 102 98 - 111 mmol/L   CO2 25 22 - 32 mmol/L   Glucose, Bld 133 (H) 70 - 99 mg/dL    Comment: Glucose reference range applies only to samples taken after fasting for at least 8 hours.   BUN <5 (L) 6 - 20 mg/dL    Creatinine, Ser 0.33 (L) 0.44 - 1.00 mg/dL   Calcium 6.8 (L) 8.9 - 10.3 mg/dL   Total Protein 6.9 6.5 - 8.1 g/dL   Albumin 2.0 (L) 3.5 - 5.0 g/dL   AST 160 (H) 15 - 41 U/L   ALT 28 0 - 44 U/L   Alkaline Phosphatase 127 (H) 38 - 126 U/L   Total Bilirubin 11.6 (H) 0.3 - 1.2 mg/dL   GFR, Estimated >60 >60 mL/min    Comment: (NOTE) Calculated using the CKD-EPI Creatinine Equation (2021)    Anion gap 5 5 - 15    Comment: Performed at [facility], [address], [place], [place] [address]  Lipase, blood     Status: None   Collection Time: 07/14/21 10:50 PM  Result Value Ref Range   Lipase 41 11 - 51 U/L    Comment: Performed at [facility], [address], [place], [place] [address]  Ethanol     Status: Abnormal   Collection Time: 07/14/21 10:50 PM  Result Value Ref Range   Alcohol, Ethyl (B) 405 (HH) <10 mg/dL    Comment: CRITICAL RESULT CALLED TO, READ BACK BY AND VERIFIED WITH: Turner,C'@2347'$  by Zigmund Daniel, b 9.14.22 (NOTE) Lowest detectable limit for serum alcohol is 10 mg/dL.  For medical purposes only. Performed at [facility], [address], [place], [place] [address]   Magnesium     Status: None   Collection Time: 07/14/21 10:50 PM  Result Value Ref Range   Magnesium 1.9 1.7 - 2.4 mg/dL    Comment: Performed at [facility], [address], [place], [place] [address]  Resp Panel by RT-PCR (Flu A&B, Covid) Nasopharyngeal Swab     Status: None   Collection Time: 07/15/21  1:58 AM   Specimen: Nasopharyngeal Swab; Nasopharyngeal(NP) swabs in vial transport medium  Result Value Ref Range   SARS Coronavirus 2 by RT PCR NEGATIVE NEGATIVE    Comment: (NOTE) SARS-CoV-2 target nucleic acids are NOT DETECTED.  The SARS-CoV-2 RNA is generally detectable in upper respiratory specimens during the acute phase of infection. The lowest concentration of SARS-CoV-2 viral copies this assay can detect is 138 copies/mL. A negative result does not preclude SARS-Cov-2 infection and should not  be used as the sole basis for treatment or other patient management decisions. A negative result may occur with  improper specimen collection/handling, submission of specimen other than nasopharyngeal swab, presence of viral mutation(s) within the areas  targeted by this assay, and inadequate number of viral copies(<138 copies/mL). A negative result must be combined with clinical observations, patient history, and epidemiological information. The expected result is Negative.  Fact Sheet for Patients:  EntrepreneurPulse.com.au  Fact Sheet for Healthcare Providers:  IncredibleEmployment.be  This test is no t yet approved or cleared by the [place] FDA and  has been authorized for detection and/or diagnosis of SARS-CoV-2 by FDA under an Emergency Use Authorization (EUA). This EUA will remain  in effect (meaning this test can be used) for the duration of the COVID-19 declaration under Section 564(b)(1) of the Act, 21 U.S.C.section 360bbb-3(b)(1), unless the authorization is terminated  or revoked sooner.       Influenza A by PCR NEGATIVE NEGATIVE   Influenza B by PCR NEGATIVE NEGATIVE    Comment: (NOTE) The Xpert Xpress SARS-CoV-2/FLU/RSV plus assay is intended as an aid in the diagnosis of influenza from Nasopharyngeal swab specimens and should not be used as a sole basis for treatment. Nasal washings and aspirates are unacceptable for Xpert Xpress SARS-CoV-2/FLU/RSV testing.  Fact Sheet for Patients: EntrepreneurPulse.com.au  Fact Sheet for Healthcare Providers: IncredibleEmployment.be  This test is not yet approved or cleared by the [place] FDA and has been authorized for detection and/or diagnosis of SARS-CoV-2 by FDA under an Emergency Use Authorization (EUA). This EUA will remain in effect (meaning this test can be used) for the duration of the COVID-19 declaration under Section 564(b)(1) of  the Act, 21 U.S.C. section 360bbb-3(b)(1), unless the authorization is terminated or revoked.  Performed at [facility], [address], [place], [place] [address]     Chemistries  Recent Labs  Lab 07/14/21 2250  NA 132*  K 2.5*  CL 102  CO2 25  GLUCOSE 133*  BUN <5*  CREATININE 0.33*  CALCIUM 6.8*  MG 1.9  AST 160*  ALT 28  ALKPHOS 127*  BILITOT 11.6*   ------------------------------------------------------------------------------------------------------------------  ------------------------------------------------------------------------------------------------------------------ GFR: CrCl cannot be calculated (Unknown ideal weight.). Liver Function Tests: Recent Labs  Lab 07/14/21 2250  AST 160*  ALT 28  ALKPHOS 127*  BILITOT 11.6*  PROT 6.9  ALBUMIN 2.0*   Recent Labs  Lab 07/14/21 2250  LIPASE 41   No results for input(s): AMMONIA in the last 168 hours. Coagulation Profile: No results for input(s): INR, PROTIME in the last 168 hours. Cardiac Enzymes: No results for input(s): CKTOTAL, CKMB, CKMBINDEX, TROPONINI in the last 168 hours. BNP (last 3 results) No results for input(s): PROBNP in the last 8760 hours. HbA1C: No results for input(s): HGBA1C in the last 72 hours. CBG: No results for input(s): GLUCAP in the last 168 hours. Lipid Profile: No results for input(s): CHOL, HDL, LDLCALC, TRIG, CHOLHDL, LDLDIRECT in the last 72 hours. Thyroid Function Tests: No results for input(s): TSH, T4TOTAL, FREET4, T3FREE, THYROIDAB in the last 72 hours. Anemia Panel: No results for input(s): VITAMINB12, FOLATE, FERRITIN, TIBC, IRON, RETICCTPCT in the last 72 hours.  --------------------------------------------------------------------------------------------------------------- Urine analysis:    Component Value Date/Time   COLORURINE AMBER (A) 06/05/2021 1554   APPEARANCEUR CLOUDY (A) 06/05/2021 1554   LABSPEC 1.026 06/05/2021 1554   PHURINE 6.0  06/05/2021 1554   GLUCOSEU 50 (A) 06/05/2021 1554   HGBUR NEGATIVE 06/05/2021 1554   BILIRUBINUR MODERATE (A) 06/05/2021 1554   KETONESUR NEGATIVE 06/05/2021 1554   PROTEINUR 30 (A) 06/05/2021 1554   UROBILINOGEN 0.2 02/17/2014 1509   NITRITE NEGATIVE 06/05/2021 1554   LEUKOCYTESUR SMALL (A) 06/05/2021 1554  Imaging Results:    No results found.  My personal review of EKG: Rhythm NSR, Rate 98 /min, QTc 446 ,no Acute ST changes   Assessment & Plan:    Active Problems:   Alcohol use disorder, severe, dependence (HCC)   Hypokalemia   Thrombocytopenia (HCC)   Alcoholic liver disease (HCC)   Hypokalemia Significantly low at 2.5 Patient reports taking her spironolactone 50 mEq of potassium given in the ED, 40 mEq given at admission Magnesium normal at 1.9 Recheck in the a.m. Alcohol use disorder with dependence Continue CIWA protocol Patient is not showing signs of withdrawal at this time, however she is still intoxicated Ascites secondary to alcoholic liver disease IR paracentesis No fever, no leukocytosis, no acute abdominal tenderness Consider albumin with paracentesis Continue spironolactone Thrombocytopenia Secondary to alcohol use disorder GERD Continue Protonix   DVT Prophylaxis-   Heparin- SCDs   AM Labs Ordered, also please review Full Orders  Family Communication: A no family at bedside Code Status: Full  Admission status: ObservationTime spent in minutes : [place] DO

## 2021-07-15 NOTE — ED Notes (Signed)
Pt remains in US

## 2021-07-15 NOTE — Procedures (Signed)
   US guided RLQ paracentesis  5.3 L bright yellow fluid Tolerated well  No labs per MD  EBL: none

## 2021-07-15 NOTE — ED Notes (Signed)
Patient transported to Ultrasound.  Refused breakfast at this time.

## 2021-07-15 NOTE — ED Notes (Signed)
Pt ambulatory to bathroom with standby assist.

## 2021-07-15 NOTE — Progress Notes (Signed)
PT tolerated right sided paracentesis procedure well today and 5.3 Liters of clear dark yellow fluid removed with vital signs remaining stable. Pt verbalized understanding of post procedure instructions and pt returned via stretcher to ED room 3.

## 2021-07-15 NOTE — Clinical Social Work Note (Signed)
CSW provided SA consulted to patient. Patient agreeable to review resources.

## 2021-07-15 NOTE — ED Notes (Addendum)
Pt returned from U/S.  5.3 liters of fluids removed via Paracentesis.

## 2021-07-16 ENCOUNTER — Inpatient Hospital Stay (HOSPITAL_COMMUNITY): Payer: Medicaid Other

## 2021-07-16 ENCOUNTER — Encounter (HOSPITAL_COMMUNITY): Payer: Self-pay | Admitting: Family Medicine

## 2021-07-16 DIAGNOSIS — I9589 Other hypotension: Secondary | ICD-10-CM

## 2021-07-16 DIAGNOSIS — E861 Hypovolemia: Secondary | ICD-10-CM

## 2021-07-16 LAB — COMPREHENSIVE METABOLIC PANEL
ALT: 18 U/L (ref 0–44)
AST: 112 U/L — ABNORMAL HIGH (ref 15–41)
Albumin: 2.7 g/dL — ABNORMAL LOW (ref 3.5–5.0)
Alkaline Phosphatase: 71 U/L (ref 38–126)
Anion gap: 3 — ABNORMAL LOW (ref 5–15)
BUN: <5 mg/dL — ABNORMAL LOW (ref 6–20)
CO2: 29 mmol/L (ref 22–32)
Calcium: 6.3 mg/dL — CL (ref 8.9–10.3)
Chloride: 103 mmol/L (ref 98–111)
Creatinine, Ser: <0.3 mg/dL — ABNORMAL LOW (ref 0.44–1.00)
Glucose, Bld: 82 mg/dL (ref 70–99)
Potassium: 2.9 mmol/L — ABNORMAL LOW (ref 3.5–5.1)
Sodium: 135 mmol/L (ref 135–145)
Total Bilirubin: 9.7 mg/dL — ABNORMAL HIGH (ref 0.3–1.2)
Total Protein: 5.6 g/dL — ABNORMAL LOW (ref 6.5–8.1)

## 2021-07-16 LAB — BASIC METABOLIC PANEL
Anion gap: 9 (ref 5–15)
BUN: <5 mg/dL — ABNORMAL LOW (ref 6–20)
CO2: 26 mmol/L (ref 22–32)
Calcium: 7.1 mg/dL — ABNORMAL LOW (ref 8.9–10.3)
Chloride: 102 mmol/L (ref 98–111)
Creatinine, Ser: 0.33 mg/dL — ABNORMAL LOW (ref 0.44–1.00)
GFR, Estimated: >60 mL/min (ref >60)
Glucose, Bld: 90 mg/dL (ref 70–99)
Potassium: 3.3 mmol/L — ABNORMAL LOW (ref 3.5–5.1)
Sodium: 137 mmol/L (ref 135–145)

## 2021-07-16 LAB — MRSA NEXT GEN BY PCR, NASAL: MRSA by PCR Next Gen: NOT DETECTED

## 2021-07-16 LAB — MAGNESIUM: Magnesium: 1.6 mg/dL — ABNORMAL LOW (ref 1.7–2.4)

## 2021-07-16 MED ORDER — HYDROMORPHONE HCL 1 MG/ML IJ SOLN
0.5000 mg | INTRAMUSCULAR | Status: DC | PRN
  Administered 2021-07-16: 0.5 mg via INTRAVENOUS
  Filled 2021-07-16: qty 0.5

## 2021-07-16 MED ORDER — POTASSIUM CHLORIDE 20 MEQ PO PACK
40.0000 meq | PACK | Freq: Once | ORAL | Status: AC
  Administered 2021-07-16: 40 meq via ORAL
  Filled 2021-07-16: qty 2

## 2021-07-16 MED ORDER — CALCIUM GLUCONATE-NACL 1-0.675 GM/50ML-% IV SOLN
1.0000 g | Freq: Once | INTRAVENOUS | Status: AC
  Administered 2021-07-16: 1000 mg via INTRAVENOUS
  Filled 2021-07-16: qty 50

## 2021-07-16 MED ORDER — ALBUMIN HUMAN 25 % IV SOLN
50.0000 g | Freq: Four times a day (QID) | INTRAVENOUS | Status: AC
Start: 2021-07-16 — End: 2021-07-16
  Administered 2021-07-16 (×2): 50 g via INTRAVENOUS
  Filled 2021-07-16 (×2): qty 200

## 2021-07-16 NOTE — Progress Notes (Signed)
PROGRESS NOTE    Patient: Victoria Gordon                            PCP: Patient, [name]                    DOB: September 12, 1981            DOA: 07/14/2021 [id number]             DOS: 07/16/2021, 1:17 PM   LOS: 1 day   Date of Service: The patient was seen and examined on 07/16/2021  Subjective:   Patient was seen and examined this morning, still complaining abdominal pain, tolerated paracentesis yesterday, scheduled for another paracentesis today. Patient blood pressure was running low overnight... Plan to IV fluid and albumin No signs of severe withdrawal,...   Brief Narrative:   Victoria Gordon  is a 40 y.o. female, with history of alcohol abuse, alcoholic cirrhosis, major depressive disorder, substance abuse-denied, hepatitis B-denied any more presents the ED with a chief complaint of abdominal pain and distention.   Was found intoxicated in ED. reporting she has lapsed her last binge drinking was last month. Report abdominal distention, progressively getting worse over 1 week.  Had a paracentesis in August for first time.    Patient reports that she does not smoke, does not use illicit drugs.  Patient does drink alcohol.  Patient vaccinated for COVID.  She also had COVID this year.   ED: Hemodynamically stable, No leukocytosis with a white blood cell count 9.7, hemoglobin 8.9, platelets 65 Chemistry reveals a hypokalemia at 2.5, calcium was 6.8 that corrects to 8.4 with an albumin of 2.0, lipase 41, AST 160, ALT 28 Alcohol level is 405 EKG shows a heart rate of 98, QTc 446, sinus rhythm with bigeminy  potassium 50 mEq total given in the ED    Assessment & Plan:   Active Problems:   Alcohol use disorder, severe, dependence (HCC)   Hypokalemia   Thrombocytopenia (HCC)   Alcoholic liver disease (HCC)   Hypotension   Active Problems:   Alcohol use disorder, severe, dependence (HCC) -Continue CIWA protocol -No significant signs of withdrawal with  exception of hypertension -As advised of alcohol use abuse, importance of alcohol use cessation -Patient stating she relapsed, trying to quit  Hypotensive  -Multifactorial likely due liver disease, autonomic dysfunction, hypoalbuminemia, medication-Ativan -Pleating electrolytes, albumin, -We will monitor closely  severe hypokalemia -On admission potassium 2.5, >>> 3.0 >>> 3.3 -Patient's potassium was repleted multiple times since yesterday -Recheck electrolytes including magnesium repleting accordingly -We will continue to monitor, repleting again orally, check a magnesium  Ascites due to chronic alcohol liver disease  -Paracentesis ordered, status post paracentesis yielding 5.3 L -Pending lab analysis -Unfortunately labs on a previous paracentesis was not sent, rescheduled for paracentesis and today 07/16/2021 with labs ordered  Hypoalbuminemia, -  repleting  Thrombocytopenia ([place]) likely due to chronic alcohol disease, liver disease, cirrhosis -Monitoring platelets, no signs of bleeding -Platelets 65, 50   Urine tract infection  -We will follow with urine culture -Initiating IV Rocephin  Hypocalcemia  -Repleted with calcium gluconate Hoping to correct as albumin   alcoholic liver disease ([place]) -Monitoring LFTs, advised against alcohol use  --------------------------------------------------------------------------------------------------------------------------------- Cultures; Paracentesis-body fluid culture >>  Urine Culture  >>>    Antimicrobials: IV Rocephin   Consultants: None  ---------------------------------------------------------------------------------------------------------------------------------  DVT prophylaxis:  SCD/Compression stockings Code Status:  Code Status: Full Code  Family Communication:  No family member present at bedside- attempt will be made to update daily The above findings and plan of care has been discussed with patient (and  family)  in detail,  they expressed understanding and agreement of above. -Advance care planning has been discussed.   Admission status:   Status is: Observation  The patient remains OBS appropriate and will d/c before 2 midnights.  Dispo: The patient is from: Home              Anticipated d/c is to: Home              Patient currently is not medically stable to d/c.   Difficult to place patient No  Level of care: Stepdown   Procedures:   No admission procedures for hospital encounter.    Antimicrobials:  Anti-infectives (From admission, onward)    Start     Dose/Rate Route Frequency Ordered Stop   07/15/21 0745  cefTRIAXone (ROCEPHIN) 1 g in sodium chloride 0.9 % 100 mL IVPB        1 g 200 mL/hr over 30 Minutes Intravenous Every 24 hours 07/15/21 0744          Medication:   Chlorhexidine Gluconate Cloth  6 each Topical Daily   feeding supplement  237 mL Oral BID BM   folic acid  1 mg Oral Daily   furosemide  40 mg Oral BID   LORazepam  0-4 mg Oral Q6H   Followed by   Derrill Memo ON 07/17/2021] LORazepam  0-4 mg Oral Q12H   multivitamin with minerals  1 tablet Oral Daily   pantoprazole  40 mg Oral Daily   propranolol  20 mg Oral BID   sertraline  100 mg Oral Daily   spironolactone  50 mg Oral Daily   thiamine  100 mg Oral Daily   Or   thiamine  100 mg Intravenous Daily   zinc sulfate  220 mg Oral Daily    ibuprofen, LORazepam **OR** [DISCONTINUED] LORazepam, ondansetron **OR** ondansetron (ZOFRAN) IV, oxyCODONE   Objective:   Vitals:   07/16/21 0900 07/16/21 1000 07/16/21 1015 07/16/21 1100  BP: 102/60 97/62    Pulse: 68 80 69   Resp: (!) '23 19 20   '$ Temp:    98.4 F (36.9 C)  TempSrc:    Oral  SpO2: 96% 94% 98%   Weight:      Height:        Intake/Output Summary (Last 24 hours) at 07/16/2021 1317 Last data filed at 07/16/2021 0500 Gross per 24 hour  Intake 795.41 ml  Output 2000 ml  Net -1204.59 ml   Filed Weights   07/15/21 2107  Weight: 84.9  kg     Examination:      Physical Exam:   General:  Alert, oriented, cooperative, no distress;   HEENT:  Normocephalic, PERRL, otherwise with in Normal limits   Neuro:  CNII-XII intact. , normal motor and sensation, reflexes intact   Lungs:   Clear to auscultation BL, Respirations unlabored, no wheezes / crackles  Cardio:    S1/S2, RRR, No murmure, No Rubs or Gallops   Abdomen:   Soft, diffuse tenderness, abdominal distention, positive fluid shift, bowel sounds active all four quadrants,  no guarding or peritoneal signs.  Muscular skeletal:  Limited exam - in bed, able to move all 4 extremities, Normal strength,  2+ pulses,  symmetric, No pitting edema  Skin:  Dry, warm to touch,  negative for any Rashes,  Wounds: Please see nursing documentation        --------------------------------------------------------------------------------------------------------------------------------    LABs:  CBC Latest Ref Rng & Units 07/15/2021 07/14/2021 06/11/2021  WBC 4.0 - 10.5 K/uL 6.4 9.7 7.1  Hemoglobin 12.0 - 15.0 g/dL 8.1(L) 8.9(L) 8.3(L)  Hematocrit 36.0 - 46.0 % 26.3(L) 29.0(L) 27.4(L)  Platelets 150 - 400 K/uL 50(L) 65(L) 68(L)   CMP Latest Ref Rng & Units 07/16/2021 07/16/2021 07/15/2021  Glucose 70 - 99 mg/dL 90 82 99  BUN 6 - 20 mg/dL <5(L) <5(L) 5(L)  Creatinine 0.44 - 1.00 mg/dL 0.33(L) <0.30(L) <0.30(L)  Sodium 135 - 145 mmol/L 137 135 134(L)  Potassium 3.5 - 5.1 mmol/L 3.3(L) 2.9(L) 3.4(L)  Chloride 98 - 111 mmol/L 102 103 105  CO2 22 - 32 mmol/L '26 29 23  '$ Calcium 8.9 - 10.3 mg/dL 7.1(L) 6.3(LL) 6.8(L)  Total Protein 6.5 - 8.1 g/dL - 5.6(L) -  Total Bilirubin 0.3 - 1.2 mg/dL - 9.7(H) -  Alkaline Phos 38 - 126 U/L - 71 -  AST 15 - 41 U/L - 112(H) -  ALT 0 - 44 U/L - 18 -       Micro Results Recent Results (from the past 240 hour(s))  Resp Panel by RT-PCR (Flu A&B, Covid) Nasopharyngeal Swab     Status: None   Collection Time: 07/15/21  1:58 AM   Specimen:  Nasopharyngeal Swab; Nasopharyngeal(NP) swabs in vial transport medium  Result Value Ref Range Status   SARS Coronavirus 2 by RT PCR NEGATIVE NEGATIVE Final    Comment: (NOTE) SARS-CoV-2 target nucleic acids are NOT DETECTED.  The SARS-CoV-2 RNA is generally detectable in upper respiratory specimens during the acute phase of infection. The lowest concentration of SARS-CoV-2 viral copies this assay can detect is 138 copies/mL. A negative result does not preclude SARS-Cov-2 infection and should not be used as the sole basis for treatment or other patient management decisions. A negative result may occur with  improper specimen collection/handling, submission of specimen other than nasopharyngeal swab, presence of viral mutation(s) within the areas targeted by this assay, and inadequate number of viral copies(<138 copies/mL). A negative result must be combined with clinical observations, patient history, and epidemiological information. The expected result is Negative.  Fact Sheet for Patients:  EntrepreneurPulse.com.au  Fact Sheet for Healthcare Providers:  IncredibleEmployment.be  This test is no t yet approved or cleared by the [place] FDA and  has been authorized for detection and/or diagnosis of SARS-CoV-2 by FDA under an Emergency Use Authorization (EUA). This EUA will remain  in effect (meaning this test can be used) for the duration of the COVID-19 declaration under Section 564(b)(1) of the Act, 21 U.S.C.section 360bbb-3(b)(1), unless the authorization is terminated  or revoked sooner.       Influenza A by PCR NEGATIVE NEGATIVE Final   Influenza B by PCR NEGATIVE NEGATIVE Final    Comment: (NOTE) The Xpert Xpress SARS-CoV-2/FLU/RSV plus assay is intended as an aid in the diagnosis of influenza from Nasopharyngeal swab specimens and should not be used as a sole basis for treatment. Nasal washings and aspirates are unacceptable for  Xpert Xpress SARS-CoV-2/FLU/RSV testing.  Fact Sheet for Patients: EntrepreneurPulse.com.au  Fact Sheet for Healthcare Providers: IncredibleEmployment.be  This test is not yet approved or cleared by the [place] FDA and has been authorized for detection and/or diagnosis of SARS-CoV-2 by FDA under an Emergency Use Authorization (EUA). This EUA will remain in effect (meaning this test can  be used) for the duration of the COVID-19 declaration under Section 564(b)(1) of the Act, 21 U.S.C. section 360bbb-3(b)(1), unless the authorization is terminated or revoked.  Performed at [facility], [address], [place], [place] [address]   MRSA Next Gen by PCR, Nasal     Status: None   Collection Time: 07/15/21  8:46 PM   Specimen: Nasal Mucosa; Nasal Swab  Result Value Ref Range Status   MRSA by PCR Next Gen NOT DETECTED NOT DETECTED Final    Comment: (NOTE) The GeneXpert MRSA Assay (FDA approved for NASAL specimens only), is one component of a comprehensive MRSA colonization surveillance program. It is not intended to diagnose MRSA infection nor to guide or monitor treatment for MRSA infections. Test performance is not FDA approved in patients less than 59 years old. Performed at [facility], [address], [place], [place] [address]     Radiology Reports US Paracentesis  Result Date: 07/15/2021 INDICATION: Ascites ETOH Cirrhosis EXAM: ULTRASOUND GUIDED RLQ PARACENTESIS MEDICATIONS: 10 cc 1% lidocaine. COMPLICATIONS: None immediate. PROCEDURE: Informed written consent was obtained from the patient after a discussion of the risks, benefits and alternatives to treatment. A timeout was performed prior to the initiation of the procedure. Initial ultrasound scanning demonstrates a large amount of ascites within the right lower abdominal quadrant. The right lower abdomen was prepped and draped in the usual sterile fashion. 1% lidocaine was used for local  anesthesia. Following this, a Yueh catheter was introduced. An ultrasound image was saved for documentation purposes. The paracentesis was performed. The catheter was removed and a dressing was applied. The patient tolerated the procedure well without immediate post procedural complication. Patient received post-procedure intravenous albumin; see nursing notes for details. FINDINGS: A total of approximately 5.3 liters of bright yellow fluid was removed. IMPRESSION: Successful ultrasound-guided paracentesis yielding 5.3 liters of peritoneal fluid. Read by Lavonia Drafts [facility] Electronically Signed   By: Rolm Baptise M.D.   On: 07/15/2021 10:55   [place] ASCITES (ABDOMEN LIMITED)  Result Date: 07/16/2021 CLINICAL DATA:  Cirrhosis. 5.3 L paracentesis yesterday. Request repeat paracentesis for laboratory studies. EXAM: LIMITED ABDOMEN ULTRASOUND FOR ASCITES TECHNIQUE: Limited ultrasound survey for ascites was performed in all four abdominal quadrants. COMPARISON:  Ultrasound abdomen 07/15/2021 FINDINGS: There are small pockets of free fluid in the pelvis bilaterally. Numerous bowel loops are seen floating within the bowel. Paracentesis was canceled for today due to small amount of fluid. Adjacent bowel loops are present increasing risk of paracentesis. IMPRESSION: Small amount of ascites in the pelvis. Paracentesis deferred at this time due to small amount of ascites. Electronically Signed   By: Franchot Gallo M.D.   On: 07/16/2021 11:10    SIGNED: Deatra James, MD, FHM. [facility],  Pager (please use [email or web address] to page/text) Please use Epic Secure Chat for non-urgent communication (7AM-7PM)  If 7PM-7AM, please contact night-coverage [email or web address], 07/16/2021, 1:17 PM

## 2021-07-17 LAB — COMPREHENSIVE METABOLIC PANEL
ALT: 19 U/L (ref 0–44)
AST: 102 U/L — ABNORMAL HIGH (ref 15–41)
Albumin: 3.2 g/dL — ABNORMAL LOW (ref 3.5–5.0)
Alkaline Phosphatase: 68 U/L (ref 38–126)
Anion gap: 4 — ABNORMAL LOW (ref 5–15)
BUN: <5 mg/dL — ABNORMAL LOW (ref 6–20)
CO2: 32 mmol/L (ref 22–32)
Calcium: 7.2 mg/dL — ABNORMAL LOW (ref 8.9–10.3)
Chloride: 98 mmol/L (ref 98–111)
Creatinine, Ser: <0.3 mg/dL — ABNORMAL LOW (ref 0.44–1.00)
Glucose, Bld: 81 mg/dL (ref 70–99)
Potassium: 3.3 mmol/L — ABNORMAL LOW (ref 3.5–5.1)
Sodium: 134 mmol/L — ABNORMAL LOW (ref 135–145)
Total Bilirubin: 10.9 mg/dL — ABNORMAL HIGH (ref 0.3–1.2)
Total Protein: 5.6 g/dL — ABNORMAL LOW (ref 6.5–8.1)

## 2021-07-17 LAB — MAGNESIUM: Magnesium: 1.5 mg/dL — ABNORMAL LOW (ref 1.7–2.4)

## 2021-07-17 MED ORDER — CALCIUM GLUCONATE-NACL 1-0.675 GM/50ML-% IV SOLN
1.0000 g | Freq: Once | INTRAVENOUS | Status: AC
  Administered 2021-07-17: 1000 mg via INTRAVENOUS
  Filled 2021-07-17: qty 50

## 2021-07-17 MED ORDER — POTASSIUM CHLORIDE CRYS ER 20 MEQ PO TBCR
40.0000 meq | EXTENDED_RELEASE_TABLET | Freq: Once | ORAL | Status: AC
  Administered 2021-07-17: 40 meq via ORAL
  Filled 2021-07-17: qty 2

## 2021-07-17 NOTE — Progress Notes (Signed)
PROGRESS NOTE    Patient: Victoria Gordon                            PCP: Patient, [name]                    DOB: [birth year]            DOA: 07/14/2021 [id number]             DOS: 07/17/2021, 11:23 AM   LOS: 2 days   Date of Service: The patient was seen and examined on 07/17/2021  Subjective:   The patient was seen and examined this morning, reporting of poor appetite, generalized abdominal pain, nausea but no vomiting...    Brief Narrative:   Victoria Gordon  is a 40 y.o. female, with history of alcohol abuse, alcoholic cirrhosis, major depressive disorder, substance abuse-denied, hepatitis B-denied any more presents the ED with a chief complaint of abdominal pain and distention.   Was found intoxicated in ED. reporting she has lapsed her last binge drinking was last month. Report abdominal distention, progressively getting worse over 1 week.  Had a paracentesis in August for first time.    Patient reports that she does not smoke, does not use illicit drugs.  Patient does drink alcohol.  Patient vaccinated for COVID.  She also had COVID this year.   ED: Hemodynamically stable, No leukocytosis with a white blood cell count 9.7, hemoglobin 8.9, platelets 65 Chemistry reveals a hypokalemia at 2.5, calcium was 6.8 that corrects to 8.4 with an albumin of 2.0, lipase 41, AST 160, ALT 28 Alcohol level is 405 EKG shows a heart rate of 98, QTc 446, sinus rhythm with bigeminy  potassium 50 mEq total given in the ED    Assessment & Plan:   Active Problems:   Alcohol use disorder, severe, dependence (HCC)   Hypokalemia   Thrombocytopenia (HCC)   Alcoholic liver disease (HCC)   Hypotension   Active Problems:    Alcohol use disorder, severe, dependence (HCC) -Continue CIWA protocol -Care to monitor closely, no significant signs of withdrawal, remained hypotensive -As advised of alcohol use abuse, importance of alcohol use cessation -Patient stating she  relapsed, trying to quit  Hypotensive  -Multifactorial likely due liver disease, autonomic dysfunction, hypoalbuminemia, medication-Ativan -Pleating electrolytes, albumin, Blood pressure remained soft but improving we will continue to monitor   severe hypokalemia -On admission potassium 2.5, >>> 3.0 >>> 3.3 -Continue to replete -Recheck electrolytes including magnesium repleting accordingly -We will continue to monitor, repleting again orally, check a magnesium  Ascites due to chronic alcohol liver disease  -Paracentesis ordered, status post paracentesis yielding 5.3 L -Pending lab analysis -Unfortunately labs on a previous paracentesis was not sent, rescheduled for paracentesis and today 07/16/2021 with labs ordered  Hypoalbuminemia, -  repleting  Thrombocytopenia ([place]) likely due to chronic alcohol disease, liver disease, cirrhosis -Monitoring platelets, no signs of bleeding -Platelets 65, 50   Urine tract infection  -We will follow with urine culture -Initiating IV Rocephin  Hypocalcemia  -Repleted with calcium gluconate Hoping to correct as albumin   alcoholic liver disease ([place]) -Monitoring LFTs, advised against alcohol use  --------------------------------------------------------------------------------------------------------------------------------- Cultures; Paracentesis-body fluid culture >>  Urine Culture  >>>    Antimicrobials: IV Rocephin   Consultants: None  ---------------------------------------------------------------------------------------------------------------------------------  DVT prophylaxis:  SCD/Compression stockings Code Status:   Code Status: Full Code  Family Communication:  No family member  present at bedside- attempt will be made to update daily The above findings and plan of care has been discussed with patient (and family)  in detail,  they expressed understanding and agreement of above. -Advance care planning has been  discussed.   Admission status:   Status is: Inpatient -  Dispo: The patient is from: Home              Anticipated d/c is to: Home              Patient currently is not medically stable to d/c.   Difficult to place patient No  Level of care: Telemetry   Procedures:   No admission procedures for hospital encounter.    Antimicrobials:  Anti-infectives (From admission, onward)    Start     Dose/Rate Route Frequency Ordered Stop   07/15/21 0745  cefTRIAXone (ROCEPHIN) 1 g in sodium chloride 0.9 % 100 mL IVPB        1 g 200 mL/hr over 30 Minutes Intravenous Every 24 hours 07/15/21 0744          Medication:   Chlorhexidine Gluconate Cloth  6 each Topical Daily   feeding supplement  237 mL Oral BID BM   folic acid  1 mg Oral Daily   furosemide  40 mg Oral BID   multivitamin with minerals  1 tablet Oral Daily   pantoprazole  40 mg Oral Daily   propranolol  20 mg Oral BID   sertraline  100 mg Oral Daily   spironolactone  50 mg Oral Daily   thiamine  100 mg Oral Daily   Or   thiamine  100 mg Intravenous Daily   zinc sulfate  220 mg Oral Daily    HYDROmorphone (DILAUDID) injection, ibuprofen, LORazepam **OR** [DISCONTINUED] LORazepam, ondansetron **OR** ondansetron (ZOFRAN) IV, oxyCODONE   Objective:   Vitals:   07/16/21 1639 07/16/21 1653 07/16/21 2005 07/17/21 0645  BP:  99/67 106/72 107/72  Pulse:  67 66 65  Resp: '19 18 18 18  '$ Temp:  98.3 F (36.8 C) 98.2 F (36.8 C) 98.4 F (36.9 C)  TempSrc:  Oral    SpO2:  98% 99% 97%  Weight:      Height:        Intake/Output Summary (Last 24 hours) at 07/17/2021 1123 Last data filed at 07/16/2021 1500 Gross per 24 hour  Intake 412.67 ml  Output --  Net 412.67 ml   Filed Weights   07/15/21 2107  Weight: 84.9 kg     Examination:       Physical Exam:   General:  Alert, oriented, cooperative, no distress;   HEENT:  Normocephalic, PERRL, otherwise with in Normal limits   Neuro:  CNII-XII intact. , normal  motor and sensation, reflexes intact   Lungs:   Clear to auscultation BL, Respirations unlabored, no wheezes / crackles  Cardio:    S1/S2, RRR, No murmure, No Rubs or Gallops   Abdomen:   Soft, diffuse abdominal tenderness, distention, with a fluid shift, bowel sounds active all four quadrants,  no guarding or peritoneal signs.  Muscular skeletal:  Limited exam - in bed, able to move all 4 extremities, Normal strength,  2+ pulses,  symmetric, No pitting edema  Skin:  Dry, warm to touch, negative for any Rashes,  Wounds: Please see nursing documentation        --------------------------------------------------------------------------------------------------------------------------------    LABs:  CBC Latest Ref Rng & Units 07/15/2021 07/14/2021 06/11/2021  WBC 4.0 -  10.5 K/uL 6.4 9.7 7.1  Hemoglobin 12.0 - 15.0 g/dL 8.1(L) 8.9(L) 8.3(L)  Hematocrit 36.0 - 46.0 % 26.3(L) 29.0(L) 27.4(L)  Platelets 150 - 400 K/uL 50(L) 65(L) 68(L)   CMP Latest Ref Rng & Units 07/17/2021 07/16/2021 07/16/2021  Glucose 70 - 99 mg/dL 81 90 82  BUN 6 - 20 mg/dL <5(L) <5(L) <5(L)  Creatinine 0.44 - 1.00 mg/dL <0.30(L) 0.33(L) <0.30(L)  Sodium 135 - 145 mmol/L 134(L) 137 135  Potassium 3.5 - 5.1 mmol/L 3.3(L) 3.3(L) 2.9(L)  Chloride 98 - 111 mmol/L 98 102 103  CO2 22 - 32 mmol/L 32 26 29  Calcium 8.9 - 10.3 mg/dL 7.2(L) 7.1(L) 6.3(LL)  Total Protein 6.5 - 8.1 g/dL 5.6(L) - 5.6(L)  Total Bilirubin 0.3 - 1.2 mg/dL 10.9(H) - 9.7(H)  Alkaline Phos 38 - 126 U/L 68 - 71  AST 15 - 41 U/L 102(H) - 112(H)  ALT 0 - 44 U/L 19 - 18       Micro Results Recent Results (from the past 240 hour(s))  Resp Panel by RT-PCR (Flu A&B, Covid) Nasopharyngeal Swab     Status: None   Collection Time: 07/15/21  1:58 AM   Specimen: Nasopharyngeal Swab; Nasopharyngeal(NP) swabs in vial transport medium  Result Value Ref Range Status   SARS Coronavirus 2 by RT PCR NEGATIVE NEGATIVE Final    Comment: (NOTE) SARS-CoV-2 target  nucleic acids are NOT DETECTED.  The SARS-CoV-2 RNA is generally detectable in upper respiratory specimens during the acute phase of infection. The lowest concentration of SARS-CoV-2 viral copies this assay can detect is 138 copies/mL. A negative result does not preclude SARS-Cov-2 infection and should not be used as the sole basis for treatment or other patient management decisions. A negative result may occur with  improper specimen collection/handling, submission of specimen other than nasopharyngeal swab, presence of viral mutation(s) within the areas targeted by this assay, and inadequate number of viral copies(<138 copies/mL). A negative result must be combined with clinical observations, patient history, and epidemiological information. The expected result is Negative.  Fact Sheet for Patients:  EntrepreneurPulse.com.au  Fact Sheet for Healthcare Providers:  IncredibleEmployment.be  This test is no t yet approved or cleared by the [place] FDA and  has been authorized for detection and/or diagnosis of SARS-CoV-2 by FDA under an Emergency Use Authorization (EUA). This EUA will remain  in effect (meaning this test can be used) for the duration of the COVID-19 declaration under Section 564(b)(1) of the Act, 21 U.S.C.section 360bbb-3(b)(1), unless the authorization is terminated  or revoked sooner.       Influenza A by PCR NEGATIVE NEGATIVE Final   Influenza B by PCR NEGATIVE NEGATIVE Final    Comment: (NOTE) The Xpert Xpress SARS-CoV-2/FLU/RSV plus assay is intended as an aid in the diagnosis of influenza from Nasopharyngeal swab specimens and should not be used as a sole basis for treatment. Nasal washings and aspirates are unacceptable for Xpert Xpress SARS-CoV-2/FLU/RSV testing.  Fact Sheet for Patients: EntrepreneurPulse.com.au  Fact Sheet for Healthcare  Providers: IncredibleEmployment.be  This test is not yet approved or cleared by the [place] FDA and has been authorized for detection and/or diagnosis of SARS-CoV-2 by FDA under an Emergency Use Authorization (EUA). This EUA will remain in effect (meaning this test can be used) for the duration of the COVID-19 declaration under Section 564(b)(1) of the Act, 21 U.S.C. section 360bbb-3(b)(1), unless the authorization is terminated or revoked.  Performed at [facility], [address] [place],  [place] [address]   MRSA Next Gen by PCR, Nasal     Status: None   Collection Time: 07/15/21  8:46 PM   Specimen: Nasal Mucosa; Nasal Swab  Result Value Ref Range Status   MRSA by PCR Next Gen NOT DETECTED NOT DETECTED Final    Comment: (NOTE) The GeneXpert MRSA Assay (FDA approved for NASAL specimens only), is one component of a comprehensive MRSA colonization surveillance program. It is not intended to diagnose MRSA infection nor to guide or monitor treatment for MRSA infections. Test performance is not FDA approved in patients less than 70 years old. Performed at [facility], [address] [place], [place] [address]     Radiology Reports US Paracentesis  Result Date: 07/15/2021 INDICATION: Ascites ETOH Cirrhosis EXAM: ULTRASOUND GUIDED RLQ PARACENTESIS MEDICATIONS: 10 cc 1% lidocaine. COMPLICATIONS: None immediate. PROCEDURE: Informed written consent was obtained from the patient after a discussion of the risks, benefits and alternatives to treatment. A timeout was performed prior to the initiation of the procedure. Initial ultrasound scanning demonstrates a large amount of ascites within the right lower abdominal quadrant. The right lower abdomen was prepped and draped in the usual sterile fashion. 1% lidocaine was used for local anesthesia. Following this, a Yueh catheter was introduced. An ultrasound image was saved for documentation purposes. The paracentesis was  performed. The catheter was removed and a dressing was applied. The patient tolerated the procedure well without immediate post procedural complication. Patient received post-procedure intravenous albumin; see nursing notes for details. FINDINGS: A total of approximately 5.3 liters of bright yellow fluid was removed. IMPRESSION: Successful ultrasound-guided paracentesis yielding 5.3 liters of peritoneal fluid. Read by Lavonia Drafts [facility] [place] Electronically Signed   By: Rolm Baptise M.D.   On: 07/15/2021 10:55   [place] ASCITES (ABDOMEN LIMITED)  Result Date: 07/16/2021 CLINICAL DATA:  Cirrhosis. 5.3 L paracentesis yesterday. Request repeat paracentesis for laboratory studies. EXAM: LIMITED ABDOMEN ULTRASOUND FOR ASCITES TECHNIQUE: Limited ultrasound survey for ascites was performed in all four abdominal quadrants. COMPARISON:  Ultrasound abdomen 07/15/2021 FINDINGS: There are small pockets of free fluid in the pelvis bilaterally. Numerous bowel loops are seen floating within the bowel. Paracentesis was canceled for today due to small amount of fluid. Adjacent bowel loops are present increasing risk of paracentesis. IMPRESSION: Small amount of ascites in the pelvis. Paracentesis deferred at this time due to small amount of ascites. Electronically Signed   By: Franchot Gallo M.D.   On: 07/16/2021 11:10    SIGNED: Deatra James, MD, FHM. [facility],  Pager (please use [email or web address] to page/text) Please use Epic Secure Chat for non-urgent communication (7AM-7PM)  If 7PM-7AM, please contact night-coverage [email or web address], 07/17/2021, 11:23 AM

## 2021-07-18 MED ORDER — PROPRANOLOL HCL 20 MG PO TABS: 10.0000 mg | ORAL_TABLET | Freq: Two times a day (BID) | ORAL | Status: DC

## 2021-07-18 MED ORDER — POTASSIUM CHLORIDE CRYS ER 20 MEQ PO TBCR
40.0000 meq | EXTENDED_RELEASE_TABLET | Freq: Once | ORAL | Status: AC
  Administered 2021-07-18: 40 meq via ORAL
  Filled 2021-07-18: qty 2

## 2021-07-18 MED ORDER — SERTRALINE HCL 100 MG PO TABS: 100.0000 mg | ORAL_TABLET | Freq: Every day | ORAL | 1 refills | Status: DC

## 2021-07-18 MED ORDER — ALBUMIN HUMAN 25 % IV SOLN
50.0000 g | Freq: Once | INTRAVENOUS | Status: AC
  Administered 2021-07-18: 50 g via INTRAVENOUS
  Filled 2021-07-18: qty 200

## 2021-07-18 MED ORDER — CIPROFLOXACIN HCL 500 MG PO TABS: 500.0000 mg | ORAL_TABLET | Freq: Two times a day (BID) | ORAL | 0 refills | Status: AC

## 2021-07-18 MED ORDER — PROPRANOLOL HCL 10 MG PO TABS: 10.0000 mg | ORAL_TABLET | Freq: Two times a day (BID) | ORAL | 1 refills | Status: DC

## 2021-07-18 MED ORDER — THIAMINE HCL 100 MG PO TABS: 100.0000 mg | ORAL_TABLET | Freq: Every day | ORAL | 3 refills | Status: AC

## 2021-07-18 MED ORDER — FOLIC ACID 1 MG PO TABS: 1.0000 mg | ORAL_TABLET | Freq: Every day | ORAL | 0 refills | Status: AC

## 2021-07-18 MED ORDER — CALCIUM GLUCONATE-NACL 1-0.675 GM/50ML-% IV SOLN
1.0000 g | Freq: Once | INTRAVENOUS | Status: AC
  Administered 2021-07-18: 1000 mg via INTRAVENOUS
  Filled 2021-07-18: qty 50

## 2021-07-18 MED ORDER — POTASSIUM CHLORIDE ER 10 MEQ PO TBCR: 10.0000 meq | EXTENDED_RELEASE_TABLET | Freq: Every day | ORAL | 2 refills | Status: DC

## 2021-07-18 MED ORDER — FAMOTIDINE 40 MG PO TABS: 40.0000 mg | ORAL_TABLET | Freq: Every evening | ORAL | 1 refills | Status: DC

## 2021-07-18 MED ORDER — FUROSEMIDE 20 MG PO TABS: 20.0000 mg | ORAL_TABLET | Freq: Every day | ORAL | 2 refills | Status: DC

## 2021-07-18 MED ORDER — SPIRONOLACTONE 25 MG PO TABS: 12.5000 mg | ORAL_TABLET | Freq: Every day | ORAL | 1 refills | Status: DC

## 2021-07-18 MED ORDER — SPIRONOLACTONE 12.5 MG HALF TABLET
12.5000 mg | ORAL_TABLET | Freq: Every day | ORAL | Status: DC
  Filled 2021-07-18: qty 1

## 2021-07-18 MED ORDER — MAGNESIUM SULFATE 2 GM/50ML IV SOLN
2.0000 g | Freq: Once | INTRAVENOUS | Status: AC
  Administered 2021-07-18: 2 g via INTRAVENOUS
  Filled 2021-07-18: qty 50

## 2021-07-18 NOTE — Discharge Summary (Signed)
Physician Discharge Gordon [facility]    Patient: Victoria Gordon                   Admit date: 07/14/2021   DOB: [birth year]             Discharge date:07/18/2021/10:19 AM [id number]                          PCP: Patient, [name]  Disposition: HOME   Recommendations for Outpatient Follow-up:   Follow up: PCP within 1 week for further evaluation recommendations and modification of current medication Continue with resources given regarding alcohol Anonymous, and abstaining from any alcohol" products. Need follow-up with a gastroenterologist for liver cirrhosis, recurrent ascites, Gastroenterologist referral has been made   Discharge Condition: Stable   Code Status:   Code Status: Full Code  Diet recommendation: Regular healthy diet   Discharge Diagnoses:    Active Problems:   Alcohol use disorder, severe, dependence (HCC)   Hypokalemia   Thrombocytopenia (HCC)   Alcoholic liver disease ([place])   Hypotension   History of Present Illness/ Hospital Course Victoria Gordon:   Victoria Gordon  is a 40 y.o. female, with history of alcohol abuse, alcoholic cirrhosis, major depressive disorder, substance abuse-denied, hepatitis B-denied any more presents the ED with a chief complaint of abdominal pain and distention.   Was found intoxicated in ED. reporting she has lapsed her last binge drinking was last month. Report abdominal distention, progressively getting worse over 1 week.  Had a paracentesis in August for first time.    Patient reports that she does not smoke, does not use illicit drugs.  Patient does drink alcohol.  Patient vaccinated for COVID.  She also had COVID this year.   ED: Hemodynamically stable, No leukocytosis with a white blood cell count 9.7, hemoglobin 8.9, platelets 65 Chemistry reveals a hypokalemia at 2.5, calcium was 6.8 that corrects to 8.4 with an albumin of 2.0, lipase 41, AST 160, ALT 28 Alcohol level is 405 EKG  shows a heart rate of 98, QTc 446, sinus rhythm with bigeminy  potassium 50 mEq total given in the ED      Alcohol use disorder, severe, dependence ([place]) -S/p treatment with  [facility] protocol - no significant signs of withdrawal, remained hypotensive -As advised of alcohol use abuse, importance of alcohol use cessation -Patient stating she relapsed, trying to quit   Hypotensive  -Multifactorial likely due liver disease, autonomic dysfunction, hypoalbuminemia, medication-Ativan -Pleating electrolytes, albumin, -Home medication of propanolol, spironolactone has been cut in half... 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 Cipro, continue empiric treatment,   Hypoalbuminemia, -  repleting   Thrombocytopenia ([place]) likely due to chronic alcohol disease, liver disease, cirrhosis -Monitoring platelets, no signs of bleeding -Platelets 65, 50    Urine tract infection  -  Unfortunately urine cultures were not sent -Initiating IV Rocephin... Switch to p.o. ciprofloxacin   Hypocalcemia/hypomagnesemia/hypoalbuminemia -Repleted with calcium gluconate Hoping to correct as albumin  -Repleting calcium, magnesium, albumin   alcoholic liver disease (HCC) -Monitoring LFTs, advised  against alcohol use   ------------------------------------------------------------------------------------------------------------------------------- Cultures; Paracentesis-body fluid culture >> culture was not sent Urine Culture  >>> was not sent       Antimicrobials: IV Rocephin >> switch to p.o. ciprofloxacin      Code Status:   Code Status: Full Code      Dispo: The patient is from: Home              Anticipated d/c is to: Home      Discharge Instructions:   Discharge Instructions     Activity as tolerated - No restrictions   Complete by: As directed    Call MD for:  difficulty breathing, headache or visual disturbances   Complete by: As directed    Call MD for:  hives   Complete by: As directed    Call MD for:  persistant nausea and vomiting   Complete by: As directed    Call MD for:  redness, tenderness, or signs of infection (pain, swelling, redness, odor or green/yellow discharge around incision site)   Complete by: As directed    Call MD for:  severe uncontrolled pain   Complete by: As directed    Call MD for:  temperature >100.4   Complete by: As directed    Diet - low sodium heart healthy   Complete by: As directed    Discharge instructions   Complete by: As directed    Please follow-up with PCP and gastroenterologist in 1 week. You need reevaluation for recurrent ascites, may need another paracentesis in a week.   Increase activity slowly   Complete by: As directed         Medication List     STOP taking these medications    ascorbic acid 500 MG tablet Commonly known as: VITAMIN C   chlordiazePOXIDE 25 MG capsule Commonly known as: LIBRIUM   hydrocortisone 25 MG suppository Commonly known as: ANUSOL-HC   pantoprazole 40 MG tablet Commonly known as: PROTONIX   prednisoLONE 5 MG Tabs tablet   zinc sulfate 220 (50 Zn) MG capsule       TAKE these medications    ciprofloxacin 500 MG tablet Commonly known as: Cipro Take 1 tablet  (500 mg total) by mouth 2 (two) times daily for 10 days.   famotidine 40 MG tablet Commonly known as: PEPCID Take 1 tablet (40 mg total) by mouth every evening.   folic acid 1 MG tablet Commonly known as: FOLVITE Take 1 tablet (1 mg total) by mouth daily. Start taking on: July 19, 2021   furosemide 20 MG tablet Commonly known as: Lasix Take 1 tablet (20 mg total) by mouth daily.   multivitamin with minerals Tabs tablet Take 1 tablet by mouth daily.   potassium chloride 10 MEQ tablet Commonly known as: KLOR-CON Take 1 tablet (10 mEq total) by mouth daily. Take While taking Lasix/furosemide   propranolol 10 MG tablet Commonly known as: INDERAL Take 1 tablet (10 mg total) by mouth 2 (two) times daily. Start taking on: July 19, 2021 What changed:  medication strength how much to take   sertraline 100 MG tablet Commonly known as: ZOLOFT Take 1 tablet (100 mg total) by mouth daily. Start taking on: July 19, 2021 What changed: additional instructions  spironolactone 25 MG tablet Commonly known as: ALDACTONE Take 0.5 tablets (12.5 mg total) by mouth daily. Start taking on: July 19, 2021 What changed:  medication strength how much to take   thiamine 100 MG tablet Take 1 tablet (100 mg total) by mouth daily.        Allergies  Allergen Reactions   Vitamin K And Related Hives, Shortness Of Breath, Itching and Rash   Vancomycin Hives and Itching     Procedures /Studies:   US Paracentesis  Result Date: 07/15/2021 INDICATION: Ascites ETOH Cirrhosis EXAM: ULTRASOUND GUIDED RLQ PARACENTESIS MEDICATIONS: 10 cc 1% lidocaine. COMPLICATIONS: None immediate. PROCEDURE: Informed written consent was obtained from the patient after a discussion of the risks, benefits and alternatives to treatment. A timeout was performed prior to the initiation of the procedure. Initial ultrasound scanning demonstrates a large amount of ascites within the right lower  abdominal quadrant. The right lower abdomen was prepped and draped in the usual sterile fashion. 1% lidocaine was used for local anesthesia. Following this, a Yueh catheter was introduced. An ultrasound image was saved for documentation purposes. The paracentesis was performed. The catheter was removed and a dressing was applied. The patient tolerated the procedure well without immediate post procedural complication. Patient received post-procedure intravenous albumin; see nursing notes for details. FINDINGS: A total of approximately 5.3 liters of bright yellow fluid was removed. IMPRESSION: Successful ultrasound-guided paracentesis yielding 5.3 liters of peritoneal fluid. Read by Lavonia Drafts [facility] Electronically Signed   By: Rolm Baptise M.D.   On: 07/15/2021 10:55   [place] ASCITES (ABDOMEN LIMITED)  Result Date: 07/16/2021 CLINICAL DATA:  Cirrhosis. 5.3 L paracentesis yesterday. Request repeat paracentesis for laboratory studies. EXAM: LIMITED ABDOMEN ULTRASOUND FOR ASCITES TECHNIQUE: Limited ultrasound survey for ascites was performed in all four abdominal quadrants. COMPARISON:  Ultrasound abdomen 07/15/2021 FINDINGS: There are small pockets of free fluid in the pelvis bilaterally. Numerous bowel loops are seen floating within the bowel. Paracentesis was canceled for today due to small amount of fluid. Adjacent bowel loops are present increasing risk of paracentesis. IMPRESSION: Small amount of ascites in the pelvis. Paracentesis deferred at this time due to small amount of ascites. Electronically Signed   By: Franchot Gallo M.D.   On: 07/16/2021 11:10    Subjective:   Patient was seen and examined 07/18/2021, 10:19 AM Patient stable today. No acute distress.  No issues overnight Stable for discharge.  Discharge Exam:    Vitals:   07/17/21 1305 07/17/21 2246 07/18/21 0532 07/18/21 0944  BP: (!) 99/58 (!) 90/58 (!) 89/47 (!) 80/47  Pulse: (!) 58 62 61   Resp: 18 18    Temp: 98.4 F (36.9 C) 98.1  F (36.7 C) 98.4 F (36.9 C)   TempSrc: Oral Oral Oral   SpO2: 99% 100% 100%   Weight:      Height:        General: Pt lying comfortably in bed & appears in no obvious distress. Cardiovascular: S1 & S2 heard, RRR, S1/S2 +. No murmurs, rubs, gallops or clicks. No JVD or pedal edema. Respiratory: Clear to auscultation without wheezing, rhonchi or crackles. No increased work of breathing. Abdominal:  Non-distended, non-tender & soft. No organomegaly or masses appreciated. Normal bowel sounds heard. CNS: Alert and oriented. No focal deficits. Extremities: no edema, no cyanosis      The results of significant diagnostics from this hospitalization (including imaging, microbiology, ancillary and laboratory) are listed below for reference.  Microbiology:   Recent Results (from the past 240 hour(s))  Resp Panel by RT-PCR (Flu A&B, Covid) Nasopharyngeal Swab     Status: None   Collection Time: 07/15/21  1:58 AM   Specimen: Nasopharyngeal Swab; Nasopharyngeal(NP) swabs in vial transport medium  Result Value Ref Range Status   SARS Coronavirus 2 by RT PCR NEGATIVE NEGATIVE Final    Comment: (NOTE) SARS-CoV-2 target nucleic acids are NOT DETECTED.  The SARS-CoV-2 RNA is generally detectable in upper respiratory specimens during the acute phase of infection. The lowest concentration of SARS-CoV-2 viral copies this assay can detect is 138 copies/mL. A negative result does not preclude SARS-Cov-2 infection and should not be used as the sole basis for treatment or other patient management decisions. A negative result may occur with  improper specimen collection/handling, submission of specimen other than nasopharyngeal swab, presence of viral mutation(s) within the areas targeted by this assay, and inadequate number of viral copies(<138 copies/mL). A negative result must be combined with clinical observations, patient history, and epidemiological information. The expected result is  Negative.  Fact Sheet for Patients:  EntrepreneurPulse.com.au  Fact Sheet for Healthcare Providers:  IncredibleEmployment.be  This test is no t yet approved or cleared by the [place] FDA and  has been authorized for detection and/or diagnosis of SARS-CoV-2 by FDA under an Emergency Use Authorization (EUA). This EUA will remain  in effect (meaning this test can be used) for the duration of the COVID-19 declaration under Section 564(b)(1) of the Act, 21 U.S.C.section 360bbb-3(b)(1), unless the authorization is terminated  or revoked sooner.       Influenza A by PCR NEGATIVE NEGATIVE Final   Influenza B by PCR NEGATIVE NEGATIVE Final    Comment: (NOTE) The Xpert Xpress SARS-CoV-2/FLU/RSV plus assay is intended as an aid in the diagnosis of influenza from Nasopharyngeal swab specimens and should not be used as a sole basis for treatment. Nasal washings and aspirates are unacceptable for Xpert Xpress SARS-CoV-2/FLU/RSV testing.  Fact Sheet for Patients: EntrepreneurPulse.com.au  Fact Sheet for Healthcare Providers: IncredibleEmployment.be  This test is not yet approved or cleared by the [place] FDA and has been authorized for detection and/or diagnosis of SARS-CoV-2 by FDA under an Emergency Use Authorization (EUA). This EUA will remain in effect (meaning this test can be used) for the duration of the COVID-19 declaration under Section 564(b)(1) of the Act, 21 U.S.C. section 360bbb-3(b)(1), unless the authorization is terminated or revoked.  Performed at [facility], [address] [place], [place] [address]   MRSA Next Gen by PCR, Nasal     Status: None   Collection Time: 07/15/21  8:46 PM   Specimen: Nasal Mucosa; Nasal Swab  Result Value Ref Range Status   MRSA by PCR Next Gen NOT DETECTED NOT DETECTED Final    Comment: (NOTE) The GeneXpert MRSA Assay (FDA approved for NASAL specimens  only), is one component of a comprehensive MRSA colonization surveillance program. It is not intended to diagnose MRSA infection nor to guide or monitor treatment for MRSA infections. Test performance is not FDA approved in patients less than 67 years old. Performed at [facility], [address] [place], [place] [address]      Labs:   CBC: Recent Labs  Lab 07/14/21 2250 07/15/21 0644  WBC 9.7 6.4  NEUTROABS 4.8 3.0  HGB 8.9* 8.1*  HCT 29.0* 26.3*  MCV 90.9 92.9  PLT 65* 50*   Basic Metabolic Panel: Recent Labs  Lab 07/14/21 2250 07/15/21  [id number] 07/15/21 1057 07/16/21 0450 07/16/21 0935 07/17/21 0526  NA 132* 135 134* 135 137 134*  K 2.5* 3.0* 3.4* 2.9* 3.3* 3.3*  CL 102 103 105 103 102 98  CO2 '25 28 23 29 26 '$ 32  GLUCOSE 133* 97 99 82 90 81  BUN <5* <5* 5* <5* <5* <5*  CREATININE 0.33* 0.36* <0.30* <0.30* 0.33* <0.30*  CALCIUM 6.8* 6.8* 6.8* 6.3* 7.1* 7.2*  MG 1.9 1.8  --  1.6*  --  1.5*   Liver Function Tests: Recent Labs  Lab 07/14/21 2250 07/15/21 0644 07/16/21 0450 07/17/21 0526  AST 160* 146* 112* 102*  ALT '28 27 18 19  '$ ALKPHOS 127* 112 71 68  BILITOT 11.6* 10.7* 9.7* 10.9*  PROT 6.9 6.2* 5.6* 5.6*  ALBUMIN 2.0* 1.7* 2.7* 3.2*   Urinalysis    Component Value Date/Time   COLORURINE AMBER (A) 07/15/2021 0602   APPEARANCEUR CLOUDY (A) 07/15/2021 0602   LABSPEC 1.021 07/15/2021 0602   PHURINE 6.0 07/15/2021 0602   GLUCOSEU 50 (A) 07/15/2021 0602   HGBUR MODERATE (A) 07/15/2021 0602   BILIRUBINUR MODERATE (A) 07/15/2021 0602   KETONESUR NEGATIVE 07/15/2021 0602   PROTEINUR 30 (A) 07/15/2021 0602   UROBILINOGEN 0.2 02/17/2014 1509   NITRITE NEGATIVE 07/15/2021 0602   LEUKOCYTESUR TRACE (A) 07/15/2021 0602         Time coordinating discharge: Over 45 minutes  SIGNED: Deatra James, MD, FACP, FHM. [facility],  Please use [email or web address] to Page If 7PM-7AM, please contact night-coverage [email or web address] 07/18/2021, 10:19  AM

## 2021-07-18 NOTE — Progress Notes (Signed)
Nsg Discharge Note  Admit Date:  07/14/2021 Discharge date: 07/18/2021   [name] to be D/C'd Home per MD order.  AVS completed.  Copy for chart, and copy for patient signed, and dated. Patient/caregiver able to verbalize understanding.  Discharge Medication: Allergies as of 07/18/2021       Reactions   Vitamin K And Related Hives, Shortness Of Breath, Itching, Rash   Vancomycin Hives, Itching        Medication List     STOP taking these medications    ascorbic acid 500 MG tablet Commonly known as: VITAMIN C   chlordiazePOXIDE 25 MG capsule Commonly known as: LIBRIUM   hydrocortisone 25 MG suppository Commonly known as: ANUSOL-HC   pantoprazole 40 MG tablet Commonly known as: PROTONIX   prednisoLONE 5 MG Tabs tablet   zinc sulfate 220 (50 Zn) MG capsule       TAKE these medications    ciprofloxacin 500 MG tablet Commonly known as: Cipro Take 1 tablet (500 mg total) by mouth 2 (two) times daily for 10 days.   famotidine 40 MG tablet Commonly known as: PEPCID Take 1 tablet (40 mg total) by mouth every evening.   folic acid 1 MG tablet Commonly known as: FOLVITE Take 1 tablet (1 mg total) by mouth daily. Start taking on: July 19, 2021   furosemide 20 MG tablet Commonly known as: Lasix Take 1 tablet (20 mg total) by mouth daily.   multivitamin with minerals Tabs tablet Take 1 tablet by mouth daily.   potassium chloride 10 MEQ tablet Commonly known as: KLOR-CON Take 1 tablet (10 mEq total) by mouth daily. Take While taking Lasix/furosemide   propranolol 10 MG tablet Commonly known as: INDERAL Take 1 tablet (10 mg total) by mouth 2 (two) times daily. Start taking on: July 19, 2021 What changed:  medication strength how much to take   sertraline 100 MG tablet Commonly known as: ZOLOFT Take 1 tablet (100 mg total) by mouth daily. Start taking on: July 19, 2021 What changed: additional instructions   spironolactone 25  MG tablet Commonly known as: ALDACTONE Take 0.5 tablets (12.5 mg total) by mouth daily. Start taking on: July 19, 2021 What changed:  medication strength how much to take   thiamine 100 MG tablet Take 1 tablet (100 mg total) by mouth daily.        Discharge Assessment: Vitals:   07/18/21 1418 07/18/21 1700  BP: 98/61 (!) 101/58  Pulse: 65   Resp: 18   Temp: 98.7 F (37.1 C)   SpO2: 100%    Skin clean, dry and intact without evidence of skin break down, no evidence of skin tears noted. IV catheter discontinued intact. Site without signs and symptoms of complications - no redness or edema noted at insertion site, patient denies c/o pain - only slight tenderness at site.  Dressing with slight pressure applied.  D/c Instructions-Education: Discharge instructions given to patient/family with verbalized understanding. D/c education completed with patient/family including follow up instructions, medication list, d/c activities limitations if indicated, with other d/c instructions as indicated by MD - patient able to verbalize understanding, all questions fully answered. Patient instructed to return to ED, call 911, or call MD for any changes in condition.  Patient escorted via [place], and D/C home via private auto.  Dorcas Mcmurray, LPN [phone] PM

## 2021-07-27 LAB — ACID FAST CULTURE WITH REFLEXED SENSITIVITIES (MYCOBACTERIA): Acid Fast Culture: NEGATIVE

## 2021-08-04 NOTE — Progress Notes (Deleted)
Primary Care Physician:  Patient, [name] Primary GI Physician: Dr. Abbey Chatters  No chief complaint on file.   HPI:   Victoria Gordon is a 40 y.o. female presenting today for hospital follow-up of decompensated alcoholic cirrhosis, alcoholic hepatitis, and rectal bleeding.    Patient has history of alcoholic cirrhosis complicated by portal hypertension, esophageal varices per CT, prior history of suspected SBP in 2018 treated empirically at [facility].  Prior extensive evaluation at [facility] including serologies and liver biopsy in 2018 with steatohepatitis and fibrosis stage II of 4.  She has immunity to hepatitis A and B.  Also with history of hemorrhoidal bleeding s/p sclerotherapy and suturing of internal hemorrhoids in June 2018 at [facility].  Last EGD at [facility] in July 2018 with portal hypertensive gastropathy.   Complete colonoscopy with internal/external hemorrhoids in January 2018.***   Patient was admitted 8/6-8/12 with decompensated liver disease (ascites, lower extremity edema with reported 21 lb weight gain, elevated INR), alcoholic hepatitis, reported rectal bleeding with bowel movements x2 weeks, and COVID-19 infection. MELD 23 on admission. CT revealed liver contour typical of cirrhosis ascites splenomegaly varices in left upper quadrant as well as esophageal varices.  Abdominal wall edema and small left ovarian dermoid.  Underwent paracentesis yielding 1.9 L, negative for SBP.  She was started on spironolactone 50 mg daily and Lasix 20 mg daily with improvement in peripheral edema.  She was started on prednisolone 40 mg daily on 8/8 for alcoholic hepatitis with plans to calculate Lilly score on 8/15.  Had considered EGD, but due to COVID-19 diagnosis and no active bleeding, this was postponed.  Suspected rectal bleeding was secondary to hemorrhoids.  Plan to monitor, consider colonoscopy if rectal bleeding continues or if patient develops  transfusion dependent anemia.  She was discharged on 8/12.  Recommended updating labs on 8/15 to calculate Lilly score, continue prednisolone 40 mg daily for now, continue propranolol, spironolactone 50 mg daily, Lasix 20 mg daily, PPI twice daily, and outpatient EGD.  Labs were never completed.  Patient was admitted 9/14-9/18 after presenting with chief complaint of abdominal pain and distention that had been progressing over 1 week.  She was found intoxicated in the ED.  Alcohol level 405.  She was found to have hypokalemia, hypomagnesemia, hypoalbuminemia. AST 160, ALT 28, alk phos 127, total bilirubin 11.6, platelets 65. GI was not consulted. She was treated with CIWA protocol, potassium/calcium/magnesium replaced, given IV albumin, underwent paracentesis yielding 5.3 L on 9/15.  No fluid analysis. Noted some soft blood pressures during admission.  Spironolactone was reduced to 25 mg daily, but appears she was discharged on 12.5 mg.  There is also concern of a urinary tract infection though urine cultures were not sent.  She was empirically treated with ciprofloxacin and discharged on 9/17 with a 10-day course of cipro. She was also discharged on potassium 10 meq daily.  Labs on day of discharge with sodium 134, potassium 3.3, albumin 3.2, AST 102, ALT 19, alk phos 68, total bilirubin 10.9, magnesium 1.5.   Today:      Request colonoscopy and flex sig results from [facility]   Past Medical History:  Diagnosis Date   Alcohol abuse    Alcoholic cirrhosis of liver with ascites ([place])    Anemia    Anxiety    Hemorrhoid    Hepatitis B    Major depressive disorder    PTSD (post-traumatic stress disorder)  Substance abuse ([place])     Past Surgical History:  Procedure Laterality Date   CESAREAN SECTION     CHOLECYSTECTOMY     MOUTH SURGERY     TUBAL LIGATION Bilateral 07/22/2014   Procedure: POST PARTUM TUBAL LIGATION;  Surgeon: Woodroe Mode, MD;  Location: [facility];  Service: Gynecology;   Laterality: Bilateral;    Current Outpatient Medications  Medication Sig Dispense Refill   famotidine (PEPCID) 40 MG tablet Take 1 tablet (40 mg total) by mouth every evening. 30 tablet 1   folic acid (FOLVITE) 1 MG tablet Take 1 tablet (1 mg total) by mouth daily. 30 tablet 0   furosemide (LASIX) 20 MG tablet Take 1 tablet (20 mg total) by mouth daily. 30 tablet 2   Multiple Vitamin (MULTIVITAMIN WITH MINERALS) TABS tablet Take 1 tablet by mouth daily. (Patient not taking: Reported on 07/15/2021) 120 tablet 3   potassium chloride (KLOR-CON) 10 MEQ tablet Take 1 tablet (10 mEq total) by mouth daily. Take While taking Lasix/furosemide 30 tablet 2   propranolol (INDERAL) 10 MG tablet Take 1 tablet (10 mg total) by mouth 2 (two) times daily. 60 tablet 1   sertraline (ZOLOFT) 100 MG tablet Take 1 tablet (100 mg total) by mouth daily. 30 tablet 1   spironolactone (ALDACTONE) 25 MG tablet Take 0.5 tablets (12.5 mg total) by mouth daily. 15 tablet 1   thiamine 100 MG tablet Take 1 tablet (100 mg total) by mouth daily. 30 tablet 3   No current facility-administered medications for this visit.    Allergies as of 08/05/2021 - Review Complete 07/16/2021  Allergen Reaction Noted   Vitamin k and related Hives, Shortness Of Breath, Itching, and Rash 09/24/2017   Vancomycin Hives and Itching 09/20/2017    Family History  Problem Relation Age of Onset   Diabetes Maternal Grandmother    Diabetes Paternal Grandfather     Social History   Socioeconomic History   Marital status: Single    Spouse name: Not on file   Number of children: Not on file   Years of education: Not on file   Highest education level: Not on file  Occupational History   Not on file  Tobacco Use   Smoking status: Never   Smokeless tobacco: Never  Vaping Use   Vaping Use: Never used  Substance and Sexual Activity   Alcohol use: Yes    Alcohol/week: 10.0 standard drinks    Types: 10 Cans of beer per week    Comment:  drinks heavily   Drug use: No   Sexual activity: Yes    Birth control/protection: None  Other Topics Concern   Not on file  Social History Narrative   Not on file   Social Determinants of Health   Financial Resource Strain: Not on file  Food Insecurity: Not on file  Transportation Needs: Not on file  Physical Activity: Not on file  Stress: Not on file  Social Connections: Not on file    Review of Systems: Gen: Denies fever, chills, anorexia. Denies fatigue, weakness, weight loss.  CV: Denies chest pain, palpitations, syncope, peripheral edema, and claudication. Resp: Denies dyspnea at rest, cough, wheezing, coughing up blood, and pleurisy. GI: Denies vomiting blood, jaundice, and fecal incontinence.   Denies dysphagia or odynophagia. Derm: Denies rash, itching, dry skin Psych: Denies depression, anxiety, memory loss, confusion. No homicidal or suicidal ideation.  Heme: Denies bruising, bleeding, and enlarged lymph nodes.  Physical Exam: There were no vitals taken  for this visit. General:   Alert and oriented. No distress noted. Pleasant and cooperative.  Head:  Normocephalic and atraumatic. Eyes:  Conjuctiva clear without scleral icterus. Mouth:  Oral mucosa pink and moist. Good dentition. No lesions. Heart:  S1, S2 present without murmurs appreciated. Lungs:  Clear to auscultation bilaterally. No wheezes, rales, or rhonchi. No distress.  Abdomen:  +BS, soft, non-tender and non-distended. No rebound or guarding. No HSM or masses noted. Msk:  Symmetrical without gross deformities. Normal posture. Extremities:  Without edema. Neurologic:  Alert and  oriented x4 Psych:  Alert and cooperative. Normal mood and affect.

## 2021-08-05 ENCOUNTER — Encounter: Payer: Self-pay | Admitting: Gastroenterology

## 2021-08-05 ENCOUNTER — Ambulatory Visit: Payer: Self-pay | Admitting: Gastroenterology

## 2021-08-20 ENCOUNTER — Emergency Department (HOSPITAL_COMMUNITY): Payer: Medicaid Other

## 2021-08-20 ENCOUNTER — Encounter (HOSPITAL_COMMUNITY): Payer: Self-pay | Admitting: *Deleted

## 2021-08-20 ENCOUNTER — Inpatient Hospital Stay (HOSPITAL_COMMUNITY)
Admission: EM | Admit: 2021-08-20 | Discharge: 2021-08-30 | DRG: 378 | Disposition: A | Discharge status: Home / Self Care | Payer: Medicaid Other | Attending: Family Medicine | Admitting: Family Medicine

## 2021-08-20 DIAGNOSIS — Z888 Allergy status to other drugs, medicaments and biological substances status: Secondary | ICD-10-CM | POA: Diagnosis not present

## 2021-08-20 DIAGNOSIS — K921 Melena: Secondary | ICD-10-CM | POA: Diagnosis present

## 2021-08-20 DIAGNOSIS — Z20822 Contact with and (suspected) exposure to covid-19: Secondary | ICD-10-CM | POA: Diagnosis present

## 2021-08-20 DIAGNOSIS — T1490XA Injury, unspecified, initial encounter: Secondary | ICD-10-CM | POA: Diagnosis present

## 2021-08-20 DIAGNOSIS — E871 Hypo-osmolality and hyponatremia: Secondary | ICD-10-CM | POA: Diagnosis present

## 2021-08-20 DIAGNOSIS — D500 Iron deficiency anemia secondary to blood loss (chronic): Secondary | ICD-10-CM | POA: Diagnosis present

## 2021-08-20 DIAGNOSIS — F4310 Post-traumatic stress disorder, unspecified: Secondary | ICD-10-CM | POA: Diagnosis present

## 2021-08-20 DIAGNOSIS — F1010 Alcohol abuse, uncomplicated: Secondary | ICD-10-CM | POA: Diagnosis present

## 2021-08-20 DIAGNOSIS — Z79899 Other long term (current) drug therapy: Secondary | ICD-10-CM

## 2021-08-20 DIAGNOSIS — R601 Generalized edema: Secondary | ICD-10-CM | POA: Diagnosis not present

## 2021-08-20 DIAGNOSIS — D689 Coagulation defect, unspecified: Secondary | ICD-10-CM | POA: Diagnosis not present

## 2021-08-20 DIAGNOSIS — F329 Major depressive disorder, single episode, unspecified: Secondary | ICD-10-CM | POA: Diagnosis present

## 2021-08-20 DIAGNOSIS — E8770 Fluid overload, unspecified: Secondary | ICD-10-CM | POA: Diagnosis present

## 2021-08-20 DIAGNOSIS — K766 Portal hypertension: Secondary | ICD-10-CM | POA: Diagnosis present

## 2021-08-20 DIAGNOSIS — I864 Gastric varices: Secondary | ICD-10-CM | POA: Diagnosis present

## 2021-08-20 DIAGNOSIS — A419 Sepsis, unspecified organism: Secondary | ICD-10-CM | POA: Diagnosis not present

## 2021-08-20 DIAGNOSIS — K7682 Hepatic encephalopathy: Secondary | ICD-10-CM | POA: Diagnosis present

## 2021-08-20 DIAGNOSIS — K7290 Hepatic failure, unspecified without coma: Secondary | ICD-10-CM | POA: Diagnosis not present

## 2021-08-20 DIAGNOSIS — E872 Acidosis, unspecified: Secondary | ICD-10-CM | POA: Diagnosis present

## 2021-08-20 DIAGNOSIS — K7031 Alcoholic cirrhosis of liver with ascites: Secondary | ICD-10-CM | POA: Diagnosis present

## 2021-08-20 DIAGNOSIS — D6959 Other secondary thrombocytopenia: Secondary | ICD-10-CM | POA: Diagnosis present

## 2021-08-20 DIAGNOSIS — D649 Anemia, unspecified: Secondary | ICD-10-CM | POA: Diagnosis present

## 2021-08-20 DIAGNOSIS — Z8619 Personal history of other infectious and parasitic diseases: Secondary | ICD-10-CM | POA: Diagnosis not present

## 2021-08-20 DIAGNOSIS — R161 Splenomegaly, not elsewhere classified: Secondary | ICD-10-CM | POA: Diagnosis present

## 2021-08-20 DIAGNOSIS — I959 Hypotension, unspecified: Secondary | ICD-10-CM | POA: Diagnosis present

## 2021-08-20 DIAGNOSIS — D61818 Other pancytopenia: Secondary | ICD-10-CM | POA: Diagnosis present

## 2021-08-20 DIAGNOSIS — I8510 Secondary esophageal varices without bleeding: Secondary | ICD-10-CM | POA: Diagnosis present

## 2021-08-20 DIAGNOSIS — E8809 Other disorders of plasma-protein metabolism, not elsewhere classified: Secondary | ICD-10-CM | POA: Diagnosis present

## 2021-08-20 DIAGNOSIS — E876 Hypokalemia: Secondary | ICD-10-CM | POA: Diagnosis not present

## 2021-08-20 DIAGNOSIS — Z881 Allergy status to other antibiotic agents status: Secondary | ICD-10-CM | POA: Diagnosis not present

## 2021-08-20 DIAGNOSIS — D684 Acquired coagulation factor deficiency: Secondary | ICD-10-CM | POA: Diagnosis present

## 2021-08-20 DIAGNOSIS — K3189 Other diseases of stomach and duodenum: Secondary | ICD-10-CM

## 2021-08-20 DIAGNOSIS — D62 Acute posthemorrhagic anemia: Secondary | ICD-10-CM | POA: Diagnosis not present

## 2021-08-20 DIAGNOSIS — J918 Pleural effusion in other conditions classified elsewhere: Secondary | ICD-10-CM | POA: Diagnosis present

## 2021-08-20 DIAGNOSIS — R6521 Severe sepsis with septic shock: Secondary | ICD-10-CM | POA: Diagnosis not present

## 2021-08-20 DIAGNOSIS — K7460 Unspecified cirrhosis of liver: Secondary | ICD-10-CM | POA: Diagnosis not present

## 2021-08-20 DIAGNOSIS — K648 Other hemorrhoids: Secondary | ICD-10-CM | POA: Diagnosis present

## 2021-08-20 DIAGNOSIS — R079 Chest pain, unspecified: Secondary | ICD-10-CM | POA: Diagnosis not present

## 2021-08-20 DIAGNOSIS — Z91199 Patient's noncompliance with other medical treatment and regimen due to unspecified reason: Secondary | ICD-10-CM

## 2021-08-20 DIAGNOSIS — K7030 Alcoholic cirrhosis of liver without ascites: Secondary | ICD-10-CM | POA: Diagnosis not present

## 2021-08-20 LAB — CBC WITH DIFFERENTIAL/PLATELET
Abs Immature Granulocytes: 0.06 10*3/uL (ref 0.00–0.07)
Basophils Absolute: 0 10*3/uL (ref 0.0–0.1)
Basophils Relative: 0 %
Eosinophils Absolute: 0.1 10*3/uL (ref 0.0–0.5)
Eosinophils Relative: 1 %
HCT: 7.6 % — ABNORMAL LOW (ref 36.0–46.0)
Hemoglobin: 2 g/dL — CL (ref 12.0–15.0)
Immature Granulocytes: 1 %
Lymphocytes Relative: 14 %
Lymphs Abs: 1.1 10*3/uL (ref 0.7–4.0)
MCH: 28.6 pg (ref 26.0–34.0)
MCHC: 26.3 g/dL — ABNORMAL LOW (ref 30.0–36.0)
MCV: 108.6 fL — ABNORMAL HIGH (ref 80.0–100.0)
Monocytes Absolute: 1 10*3/uL (ref 0.1–1.0)
Monocytes Relative: 12 %
Neutro Abs: 5.8 10*3/uL (ref 1.7–7.7)
Neutrophils Relative %: 72 %
Platelets: 59 10*3/uL — ABNORMAL LOW (ref 150–400)
RBC: 0.7 MIL/uL — ABNORMAL LOW (ref 3.87–5.11)
RDW: 22.5 % — ABNORMAL HIGH (ref 11.5–15.5)
WBC: 8 10*3/uL (ref 4.0–10.5)
nRBC: 0.3 % — ABNORMAL HIGH (ref 0.0–0.2)

## 2021-08-20 LAB — COMPREHENSIVE METABOLIC PANEL
ALT: 21 U/L (ref 0–44)
ALT: 19 U/L (ref 0–44)
AST: 64 U/L — ABNORMAL HIGH (ref 15–41)
AST: 62 U/L — ABNORMAL HIGH (ref 15–41)
Albumin: 1.9 g/dL — ABNORMAL LOW (ref 3.5–5.0)
Albumin: 1.7 g/dL — ABNORMAL LOW (ref 3.5–5.0)
Alkaline Phosphatase: 75 U/L (ref 38–126)
Alkaline Phosphatase: 68 U/L (ref 38–126)
Anion gap: 10 (ref 5–15)
Anion gap: 10 (ref 5–15)
BUN: 25 mg/dL — ABNORMAL HIGH (ref 6–20)
BUN: 24 mg/dL — ABNORMAL HIGH (ref 6–20)
CO2: 18 mmol/L — ABNORMAL LOW (ref 22–32)
CO2: 18 mmol/L — ABNORMAL LOW (ref 22–32)
Calcium: 7.2 mg/dL — ABNORMAL LOW (ref 8.9–10.3)
Calcium: 7.3 mg/dL — ABNORMAL LOW (ref 8.9–10.3)
Chloride: 97 mmol/L — ABNORMAL LOW (ref 98–111)
Chloride: 99 mmol/L (ref 98–111)
Creatinine, Ser: 0.78 mg/dL (ref 0.44–1.00)
Creatinine, Ser: 0.95 mg/dL (ref 0.44–1.00)
GFR, Estimated: >60 mL/min (ref >60)
GFR, Estimated: >60 mL/min (ref >60)
Glucose, Bld: 101 mg/dL — ABNORMAL HIGH (ref 70–99)
Glucose, Bld: 96 mg/dL (ref 70–99)
Potassium: 3.2 mmol/L — ABNORMAL LOW (ref 3.5–5.1)
Potassium: 3.3 mmol/L — ABNORMAL LOW (ref 3.5–5.1)
Sodium: 125 mmol/L — ABNORMAL LOW (ref 135–145)
Sodium: 127 mmol/L — ABNORMAL LOW (ref 135–145)
Total Bilirubin: 17 mg/dL — ABNORMAL HIGH (ref 0.3–1.2)
Total Bilirubin: 15.6 mg/dL — ABNORMAL HIGH (ref 0.3–1.2)
Total Protein: 5.2 g/dL — ABNORMAL LOW (ref 6.5–8.1)
Total Protein: 5 g/dL — ABNORMAL LOW (ref 6.5–8.1)

## 2021-08-20 LAB — PROTIME-INR
INR: 5 (ref 0.8–1.2)
Prothrombin Time: 46.2 seconds — ABNORMAL HIGH (ref 11.4–15.2)

## 2021-08-20 LAB — CBC
HCT: 7.8 % — ABNORMAL LOW (ref 36.0–46.0)
HCT: 7.2 % — ABNORMAL LOW (ref 36.0–46.0)
Hemoglobin: 2 g/dL — CL (ref 12.0–15.0)
Hemoglobin: 1.8 g/dL — CL (ref 12.0–15.0)
MCH: 29 pg (ref 26.0–34.0)
MCH: 27.3 pg (ref 26.0–34.0)
MCHC: 25.6 g/dL — ABNORMAL LOW (ref 30.0–36.0)
MCHC: 25 g/dL — ABNORMAL LOW (ref 30.0–36.0)
MCV: 113 fL — ABNORMAL HIGH (ref 80.0–100.0)
MCV: 109.1 fL — ABNORMAL HIGH (ref 80.0–100.0)
Platelets: 62 10*3/uL — ABNORMAL LOW (ref 150–400)
Platelets: 57 10*3/uL — ABNORMAL LOW (ref 150–400)
RBC: 0.69 MIL/uL — ABNORMAL LOW (ref 3.87–5.11)
RBC: <1 MIL/uL — ABNORMAL LOW (ref 3.87–5.11)
RDW: 22.3 % — ABNORMAL HIGH (ref 11.5–15.5)
RDW: 22.5 % — ABNORMAL HIGH (ref 11.5–15.5)
WBC: 7.4 10*3/uL (ref 4.0–10.5)
WBC: 6.9 10*3/uL (ref 4.0–10.5)
nRBC: 0.4 % — ABNORMAL HIGH (ref 0.0–0.2)
nRBC: 0.4 % — ABNORMAL HIGH (ref 0.0–0.2)

## 2021-08-20 LAB — LIPASE, BLOOD: Lipase: 65 U/L — ABNORMAL HIGH (ref 11–51)

## 2021-08-20 LAB — MAGNESIUM: Magnesium: 2.1 mg/dL (ref 1.7–2.4)

## 2021-08-20 LAB — RESP PANEL BY RT-PCR (FLU A&B, COVID) ARPGX2
Influenza A by PCR: NEGATIVE
Influenza B by PCR: NEGATIVE
SARS Coronavirus 2 by RT PCR: NEGATIVE

## 2021-08-20 LAB — LACTIC ACID, PLASMA
Lactic Acid, Venous: 5.3 mmol/L (ref 0.5–1.9)
Lactic Acid, Venous: 4.6 mmol/L (ref 0.5–1.9)

## 2021-08-20 LAB — PREPARE RBC (CROSSMATCH)

## 2021-08-20 LAB — ETHANOL
Alcohol, Ethyl (B): <10 mg/dL (ref <10)
Alcohol, Ethyl (B): <10 mg/dL (ref <10)

## 2021-08-20 LAB — TROPONIN I (HIGH SENSITIVITY)
Troponin I (High Sensitivity): 64 ng/L — ABNORMAL HIGH (ref <18)
Troponin I (High Sensitivity): 69 ng/L — ABNORMAL HIGH (ref <18)

## 2021-08-20 MED ORDER — ADULT MULTIVITAMIN W/MINERALS CH
1.0000 | ORAL_TABLET | Freq: Every day | ORAL | Status: DC
  Administered 2021-08-21 – 2021-08-30 (×10): 1 via ORAL
  Filled 2021-08-20 (×10): qty 1

## 2021-08-20 MED ORDER — THIAMINE HCL 100 MG/ML IJ SOLN
500.0000 mg | Freq: Every day | INTRAVENOUS | Status: AC
  Administered 2021-08-21 – 2021-08-22 (×3): 500 mg via INTRAVENOUS
  Filled 2021-08-20 (×5): qty 5

## 2021-08-20 MED ORDER — SODIUM CHLORIDE 0.9 % IV SOLN: 1.0000 mg | Freq: Once | INTRAVENOUS | Status: DC

## 2021-08-20 MED ORDER — PANTOPRAZOLE SODIUM 40 MG IV SOLR
40.0000 mg | Freq: Once | INTRAVENOUS | Status: AC
  Administered 2021-08-20: 40 mg via INTRAVENOUS
  Filled 2021-08-20: qty 40

## 2021-08-20 MED ORDER — SODIUM CHLORIDE 0.9 % IV SOLN
2.0000 g | Freq: Every day | INTRAVENOUS | Status: AC
  Administered 2021-08-21 – 2021-08-24 (×5): 2 g via INTRAVENOUS
  Filled 2021-08-20 (×5): qty 20

## 2021-08-20 MED ORDER — FOLIC ACID 5 MG/ML IJ SOLN
1.0000 mg | Freq: Once | INTRAMUSCULAR | Status: AC
  Administered 2021-08-21: 1 mg via INTRAVENOUS
  Filled 2021-08-20: qty 0.2

## 2021-08-20 MED ORDER — IOHEXOL 350 MG/ML SOLN
100.0000 mL | Freq: Once | INTRAVENOUS | Status: AC | PRN
  Administered 2021-08-20: 100 mL via INTRAVENOUS

## 2021-08-20 MED ORDER — SODIUM CHLORIDE 0.9 % IV BOLUS
1000.0000 mL | Freq: Once | INTRAVENOUS | Status: AC
  Administered 2021-08-20: 1000 mL via INTRAVENOUS

## 2021-08-20 MED ORDER — MIDODRINE HCL 5 MG PO TABS
5.0000 mg | ORAL_TABLET | Freq: Three times a day (TID) | ORAL | Status: DC
  Administered 2021-08-21 – 2021-08-27 (×20): 5 mg via ORAL
  Filled 2021-08-20 (×20): qty 1

## 2021-08-20 MED ORDER — ONDANSETRON HCL 4 MG/2ML IJ SOLN
4.0000 mg | Freq: Four times a day (QID) | INTRAMUSCULAR | Status: DC | PRN
  Administered 2021-08-21: 4 mg via INTRAVENOUS
  Filled 2021-08-20: qty 2

## 2021-08-20 MED ORDER — PANTOPRAZOLE SODIUM 40 MG IV SOLR
40.0000 mg | Freq: Two times a day (BID) | INTRAVENOUS | Status: DC
  Administered 2021-08-21 – 2021-08-23 (×6): 40 mg via INTRAVENOUS
  Filled 2021-08-20 (×6): qty 40

## 2021-08-20 MED ORDER — EMPTY CONTAINERS FLEXIBLE MISC
Freq: Once | Status: AC
  Filled 2021-08-20: qty 400

## 2021-08-20 MED ORDER — FENTANYL CITRATE PF 50 MCG/ML IJ SOSY
25.0000 ug | PREFILLED_SYRINGE | Freq: Once | INTRAMUSCULAR | Status: AC
  Administered 2021-08-20: 25 ug via INTRAVENOUS
  Filled 2021-08-20: qty 1

## 2021-08-20 NOTE — ED Notes (Signed)
RN spoke with blood bank regarding blood transfusion. Pt has significant antigens and will be another hour before blood can be completely typed and screened. Spoke with admitting provider Dr Valeta Harms states pt stable enough to continue to wait for complete screening and does nto require release of emergency blood.
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
## 2021-08-20 NOTE — ED Notes (Signed)
Patient transported to CT w/ RN. 
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
## 2021-08-20 NOTE — ED Notes (Signed)
Date and time results received: 08/20/21 2025 (use smartphrase ".now" to insert current time)  Test: Lactic Acid Critical Value: 5.3  Name of Provider Notified: K. Horton  Orders Received? Or Actions Taken?: na

## 2021-08-20 NOTE — ED Notes (Signed)
Pt has signed blood consent.
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
## 2021-08-21 DIAGNOSIS — K7031 Alcoholic cirrhosis of liver with ascites: Secondary | ICD-10-CM

## 2021-08-21 DIAGNOSIS — A419 Sepsis, unspecified organism: Secondary | ICD-10-CM

## 2021-08-21 DIAGNOSIS — K7030 Alcoholic cirrhosis of liver without ascites: Secondary | ICD-10-CM

## 2021-08-21 DIAGNOSIS — D62 Acute posthemorrhagic anemia: Secondary | ICD-10-CM

## 2021-08-21 DIAGNOSIS — I8510 Secondary esophageal varices without bleeding: Secondary | ICD-10-CM

## 2021-08-21 DIAGNOSIS — R6521 Severe sepsis with septic shock: Secondary | ICD-10-CM

## 2021-08-21 LAB — CBC
HCT: 6.3 % — ABNORMAL LOW (ref 36.0–46.0)
HCT: 15.4 % — ABNORMAL LOW (ref 36.0–46.0)
Hemoglobin: 1.7 g/dL — CL (ref 12.0–15.0)
Hemoglobin: 5.1 g/dL — CL (ref 12.0–15.0)
MCH: 28.3 pg (ref 26.0–34.0)
MCH: 27.9 pg (ref 26.0–34.0)
MCHC: 27 g/dL — ABNORMAL LOW (ref 30.0–36.0)
MCHC: 33.1 g/dL (ref 30.0–36.0)
MCV: 105 fL — ABNORMAL HIGH (ref 80.0–100.0)
MCV: 84.2 fL (ref 80.0–100.0)
Platelets: 49 10*3/uL — ABNORMAL LOW (ref 150–400)
Platelets: 40 10*3/uL — ABNORMAL LOW (ref 150–400)
RBC: <1 MIL/uL — ABNORMAL LOW (ref 3.87–5.11)
RBC: 1.83 MIL/uL — ABNORMAL LOW (ref 3.87–5.11)
RDW: 22.3 % — ABNORMAL HIGH (ref 11.5–15.5)
RDW: 21 % — ABNORMAL HIGH (ref 11.5–15.5)
WBC: 5.8 10*3/uL (ref 4.0–10.5)
WBC: 3.7 10*3/uL — ABNORMAL LOW (ref 4.0–10.5)
nRBC: 0.3 % — ABNORMAL HIGH (ref 0.0–0.2)
nRBC: 0.8 % — ABNORMAL HIGH (ref 0.0–0.2)

## 2021-08-21 LAB — BASIC METABOLIC PANEL
Anion gap: 8 (ref 5–15)
Anion gap: 7 (ref 5–15)
BUN: 24 mg/dL — ABNORMAL HIGH (ref 6–20)
BUN: 21 mg/dL — ABNORMAL HIGH (ref 6–20)
CO2: 20 mmol/L — ABNORMAL LOW (ref 22–32)
CO2: 21 mmol/L — ABNORMAL LOW (ref 22–32)
Calcium: 7.3 mg/dL — ABNORMAL LOW (ref 8.9–10.3)
Calcium: 7.8 mg/dL — ABNORMAL LOW (ref 8.9–10.3)
Chloride: 100 mmol/L (ref 98–111)
Chloride: 99 mmol/L (ref 98–111)
Creatinine, Ser: 0.89 mg/dL (ref 0.44–1.00)
Creatinine, Ser: 0.84 mg/dL (ref 0.44–1.00)
GFR, Estimated: >60 mL/min (ref >60)
GFR, Estimated: >60 mL/min (ref >60)
Glucose, Bld: 92 mg/dL (ref 70–99)
Glucose, Bld: 142 mg/dL — ABNORMAL HIGH (ref 70–99)
Potassium: 3.3 mmol/L — ABNORMAL LOW (ref 3.5–5.1)
Potassium: 3.1 mmol/L — ABNORMAL LOW (ref 3.5–5.1)
Sodium: 128 mmol/L — ABNORMAL LOW (ref 135–145)
Sodium: 127 mmol/L — ABNORMAL LOW (ref 135–145)

## 2021-08-21 LAB — GLUCOSE, CAPILLARY: Glucose-Capillary: 84 mg/dL (ref 70–99)

## 2021-08-21 LAB — PREPARE RBC (CROSSMATCH)

## 2021-08-21 LAB — PHOSPHORUS: Phosphorus: 4.1 mg/dL (ref 2.5–4.6)

## 2021-08-21 LAB — HEMOGLOBIN AND HEMATOCRIT, BLOOD
HCT: 7.9 % — ABNORMAL LOW (ref 36.0–46.0)
HCT: 11.2 % — ABNORMAL LOW (ref 36.0–46.0)
Hemoglobin: 2.3 g/dL — CL (ref 12.0–15.0)
Hemoglobin: 3.6 g/dL — CL (ref 12.0–15.0)

## 2021-08-21 LAB — GLUCOSE, PLEURAL OR PERITONEAL FLUID: Glucose, Fluid: 139 mg/dL

## 2021-08-21 LAB — URINALYSIS, ROUTINE W REFLEX MICROSCOPIC
Bilirubin Urine: NEGATIVE
Glucose, UA: NEGATIVE mg/dL
Ketones, ur: NEGATIVE mg/dL
Nitrite: NEGATIVE
Protein, ur: 30 mg/dL — AB
Specific Gravity, Urine: 1.039 — ABNORMAL HIGH (ref 1.005–1.030)
WBC, UA: >50 WBC/hpf — ABNORMAL HIGH (ref 0–5)
pH: 5 (ref 5.0–8.0)

## 2021-08-21 LAB — BODY FLUID CELL COUNT WITH DIFFERENTIAL
Eos, Fluid: 0 %
Lymphs, Fluid: 61 %
Monocyte-Macrophage-Serous Fluid: 27 % — ABNORMAL LOW (ref 50–90)
Neutrophil Count, Fluid: 12 % (ref 0–25)
Total Nucleated Cell Count, Fluid: 50 cu mm (ref 0–1000)

## 2021-08-21 LAB — MRSA NEXT GEN BY PCR, NASAL: MRSA by PCR Next Gen: NOT DETECTED

## 2021-08-21 LAB — MAGNESIUM: Magnesium: 2.2 mg/dL (ref 1.7–2.4)

## 2021-08-21 LAB — PROTEIN, PLEURAL OR PERITONEAL FLUID: Total protein, fluid: <3 g/dL

## 2021-08-21 LAB — PREGNANCY, URINE: Preg Test, Ur: NEGATIVE

## 2021-08-21 LAB — LACTIC ACID, PLASMA: Lactic Acid, Venous: 3.3 mmol/L (ref 0.5–1.9)

## 2021-08-21 MED ORDER — ORAL CARE MOUTH RINSE
15.0000 mL | Freq: Two times a day (BID) | OROMUCOSAL | Status: DC
  Administered 2021-08-21 – 2021-08-30 (×19): 15 mL via OROMUCOSAL

## 2021-08-21 MED ORDER — SODIUM CHLORIDE 0.9 % IV SOLN
50.0000 ug/h | INTRAVENOUS | Status: DC
  Administered 2021-08-21 – 2021-08-24 (×9): 50 ug/h via INTRAVENOUS
  Filled 2021-08-21 (×11): qty 1

## 2021-08-21 MED ORDER — SODIUM CHLORIDE 0.9% IV SOLUTION: Freq: Once | INTRAVENOUS | Status: AC

## 2021-08-21 MED ORDER — VITAMIN K1 10 MG/ML IJ SOLN
10.0000 mg | Freq: Once | INTRAVENOUS | Status: AC
  Administered 2021-08-21: 10 mg via INTRAVENOUS
  Filled 2021-08-21: qty 1

## 2021-08-21 MED ORDER — FOLIC ACID 1 MG PO TABS
1.0000 mg | ORAL_TABLET | Freq: Every day | ORAL | Status: DC
  Administered 2021-08-22 – 2021-08-30 (×9): 1 mg via ORAL
  Filled 2021-08-21 (×9): qty 1

## 2021-08-21 MED ORDER — SODIUM CHLORIDE 0.9 % IV SOLN
250.0000 mL | INTRAVENOUS | Status: DC
  Administered 2021-08-21: 250 mL via INTRAVENOUS

## 2021-08-21 MED ORDER — SODIUM CHLORIDE 0.9 % IV SOLN: INTRAVENOUS | Status: DC | PRN

## 2021-08-21 MED ORDER — LACTULOSE 10 GM/15ML PO SOLN
20.0000 g | Freq: Two times a day (BID) | ORAL | Status: DC
  Administered 2021-08-21 – 2021-08-30 (×19): 20 g via ORAL
  Filled 2021-08-21 (×19): qty 30

## 2021-08-21 MED ORDER — SODIUM CHLORIDE 0.9 % IV SOLN
INTRAVENOUS | Status: DC | PRN
  Administered 2021-08-21: 500 mL via INTRAVENOUS

## 2021-08-21 MED ORDER — POTASSIUM CHLORIDE 10 MEQ/100ML IV SOLN
10.0000 meq | INTRAVENOUS | Status: DC
  Administered 2021-08-21 (×4): 10 meq via INTRAVENOUS
  Filled 2021-08-21 (×4): qty 100

## 2021-08-21 MED ORDER — BOOST / RESOURCE BREEZE PO LIQD CUSTOM
1.0000 | Freq: Three times a day (TID) | ORAL | Status: DC
  Administered 2021-08-21 – 2021-08-30 (×27): 1 via ORAL
  Filled 2021-08-21: qty 1

## 2021-08-21 MED ORDER — NOREPINEPHRINE 4 MG/250ML-% IV SOLN
2.0000 ug/min | INTRAVENOUS | Status: DC
  Administered 2021-08-21: 2 ug/min via INTRAVENOUS
  Filled 2021-08-21: qty 250

## 2021-08-21 MED ORDER — RIFAXIMIN 550 MG PO TABS
550.0000 mg | ORAL_TABLET | Freq: Two times a day (BID) | ORAL | Status: DC
  Administered 2021-08-21 – 2021-08-30 (×19): 550 mg via ORAL
  Filled 2021-08-21 (×19): qty 1

## 2021-08-21 MED ORDER — FUROSEMIDE 10 MG/ML IJ SOLN
20.0000 mg | Freq: Once | INTRAMUSCULAR | Status: AC
  Administered 2021-08-21: 20 mg via INTRAVENOUS
  Filled 2021-08-21: qty 2

## 2021-08-21 MED ORDER — POTASSIUM CHLORIDE CRYS ER 20 MEQ PO TBCR
20.0000 meq | EXTENDED_RELEASE_TABLET | ORAL | Status: DC
  Administered 2021-08-21: 20 meq via ORAL
  Filled 2021-08-21: qty 1

## 2021-08-21 MED ORDER — DIPHENHYDRAMINE HCL 25 MG PO CAPS
50.0000 mg | ORAL_CAPSULE | Freq: Four times a day (QID) | ORAL | Status: DC | PRN
  Administered 2021-08-21 – 2021-08-23 (×3): 50 mg via ORAL
  Filled 2021-08-21 (×3): qty 2

## 2021-08-21 MED ORDER — SPIRONOLACTONE 25 MG PO TABS
25.0000 mg | ORAL_TABLET | Freq: Every day | ORAL | Status: DC
  Administered 2021-08-21 – 2021-08-22 (×2): 25 mg via ORAL
  Filled 2021-08-21 (×3): qty 1

## 2021-08-21 MED ORDER — CHLORHEXIDINE GLUCONATE CLOTH 2 % EX PADS
6.0000 | MEDICATED_PAD | Freq: Every day | CUTANEOUS | Status: DC
  Administered 2021-08-21 – 2021-08-29 (×8): 6 via TOPICAL

## 2021-08-21 MED ORDER — ALBUMIN HUMAN 25 % IV SOLN
25.0000 g | Freq: Four times a day (QID) | INTRAVENOUS | Status: DC
  Administered 2021-08-21 – 2021-08-22 (×4): 25 g via INTRAVENOUS
  Filled 2021-08-21 (×4): qty 100
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
## 2021-08-21 NOTE — Procedures (Addendum)
Paracentesis Procedure Note  Keleigh Kazee  [id number]  [birth year]  Date:08/21/21  Time:4:12 PM   Provider Performing:Lydian Chavous C Tamala Julian    Procedure: Paracentesis with imaging guidance (68341)  Indication(s) Ascites  Consent Risks of the procedure as well as the alternatives and risks of each were explained to the patient and/or caregiver.  Consent for the procedure was obtained and is signed in the bedside chart  Anesthesia Topical only with 1% lidocaine    Time Out Verified patient identification, verified procedure, site/side was marked, verified correct patient position, special equipment/implants available, medications/allergies/relevant history reviewed, required imaging and test results available.   Sterile Technique Maximal sterile technique including full sterile barrier drape, hand hygiene, sterile gown, sterile gloves, mask, hair covering, sterile ultrasound probe cover (if used).   Procedure Description Ultrasound used to identify appropriate peritoneal anatomy for placement and overlying skin marked.  (Fluid deep, large amount of subcutaneous edema to cross, confirmed on CT there is not that much).  Area of drainage cleaned and draped in sterile fashion. Lidocaine was used to anesthetize the skin and subcutaneous tissue.  500 cc's of straw appearing fluid was drained before no further could be aspirated. Catheter then removed and bandaid applied to site.   Complications/Tolerance None; patient tolerated the procedure well.   EBL Minimal   Specimen(s) Peritoneal fluid

## 2021-08-21 NOTE — Progress Notes (Signed)
[facility] ADULT ICU REPLACEMENT PROTOCOL   The patient does apply for the [facility] Adult ICU Electrolyte Replacment Protocol based on the criteria listed below:   1.Exclusion criteria: TCTS patients, ECMO patients, and Dialysis patients 2. Is GFR >/= 30 ml/min? Yes.    Patient's GFR today is >60 3. Is SCr </= 2? Yes.   Patient's SCr is 0.89 mg/dL 4. Did SCr increase >/= 0.5 in 24 hours? No. 5.Pt's weight >40kg  Yes.   6. Abnormal electrolyte(s):  K 3.3  7. Electrolytes replaced per protocol 8.  Call MD STAT for K+ </= 2.5, Phos </= 1, or Mag </= 1 Physician:  S. Rosie Fate R Amelianna Meller 08/21/2021 4:32 AM
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
## 2021-08-21 NOTE — Progress Notes (Signed)
[facility] Progress Note Patient Name: Victoria Gordon DOB: Jun 19, 1981 MRN: [id number]   Date of Service  08/21/2021  HPI/Events of Note  Severe Anemia - Hgb = 1.8. The blood bank has 1 unit typed and crossmatched, however, it has not been fully screened for antibodies and would be available for emergency release. The blood bank anticipates that the antibody testing will not be complete until tomorrow.  This is a difficult situation. However, anemia this severe has a significant risk of mortality if not treated.   eICU Interventions  Will transfuse 1 unit PRBC now on emergent basis.     Intervention Category Major Interventions: Other:  Jedediah Noda Cornelia Copa 08/21/2021, 1:55 AM

## 2021-08-22 ENCOUNTER — Other Ambulatory Visit: Payer: Self-pay

## 2021-08-22 DIAGNOSIS — R601 Generalized edema: Secondary | ICD-10-CM

## 2021-08-22 LAB — CBC
HCT: 20.3 % — ABNORMAL LOW (ref 36.0–46.0)
HCT: 23 % — ABNORMAL LOW (ref 36.0–46.0)
HCT: 23.2 % — ABNORMAL LOW (ref 36.0–46.0)
Hemoglobin: 6.4 g/dL — CL (ref 12.0–15.0)
Hemoglobin: 7.4 g/dL — ABNORMAL LOW (ref 12.0–15.0)
Hemoglobin: 7.6 g/dL — ABNORMAL LOW (ref 12.0–15.0)
MCH: 27.2 pg (ref 26.0–34.0)
MCH: 27.5 pg (ref 26.0–34.0)
MCH: 27.6 pg (ref 26.0–34.0)
MCHC: 31.5 g/dL (ref 30.0–36.0)
MCHC: 32.2 g/dL (ref 30.0–36.0)
MCHC: 32.8 g/dL (ref 30.0–36.0)
MCV: 86.4 fL (ref 80.0–100.0)
MCV: 85.5 fL (ref 80.0–100.0)
MCV: 84.4 fL (ref 80.0–100.0)
Platelets: 37 10*3/uL — ABNORMAL LOW (ref 150–400)
Platelets: 41 10*3/uL — ABNORMAL LOW (ref 150–400)
Platelets: 36 10*3/uL — ABNORMAL LOW (ref 150–400)
RBC: 2.35 MIL/uL — ABNORMAL LOW (ref 3.87–5.11)
RBC: 2.69 MIL/uL — ABNORMAL LOW (ref 3.87–5.11)
RBC: 2.75 MIL/uL — ABNORMAL LOW (ref 3.87–5.11)
RDW: 19.9 % — ABNORMAL HIGH (ref 11.5–15.5)
RDW: 20 % — ABNORMAL HIGH (ref 11.5–15.5)
RDW: 20.1 % — ABNORMAL HIGH (ref 11.5–15.5)
WBC: 2.8 10*3/uL — ABNORMAL LOW (ref 4.0–10.5)
WBC: 3.2 10*3/uL — ABNORMAL LOW (ref 4.0–10.5)
WBC: 3.4 10*3/uL — ABNORMAL LOW (ref 4.0–10.5)
nRBC: 2.2 % — ABNORMAL HIGH (ref 0.0–0.2)
nRBC: 4.1 % — ABNORMAL HIGH (ref 0.0–0.2)
nRBC: 2.3 % — ABNORMAL HIGH (ref 0.0–0.2)

## 2021-08-22 LAB — PROTIME-INR
INR: 4.5 (ref 0.8–1.2)
Prothrombin Time: 42.9 seconds — ABNORMAL HIGH (ref 11.4–15.2)

## 2021-08-22 LAB — BASIC METABOLIC PANEL
Anion gap: 9 (ref 5–15)
Anion gap: 7 (ref 5–15)
BUN: 18 mg/dL (ref 6–20)
BUN: 17 mg/dL (ref 6–20)
CO2: 21 mmol/L — ABNORMAL LOW (ref 22–32)
CO2: 21 mmol/L — ABNORMAL LOW (ref 22–32)
Calcium: 7.9 mg/dL — ABNORMAL LOW (ref 8.9–10.3)
Calcium: 7.3 mg/dL — ABNORMAL LOW (ref 8.9–10.3)
Chloride: 100 mmol/L (ref 98–111)
Chloride: 102 mmol/L (ref 98–111)
Creatinine, Ser: 0.74 mg/dL (ref 0.44–1.00)
Creatinine, Ser: 0.8 mg/dL (ref 0.44–1.00)
GFR, Estimated: >60 mL/min (ref >60)
GFR, Estimated: >60 mL/min (ref >60)
Glucose, Bld: 101 mg/dL — ABNORMAL HIGH (ref 70–99)
Glucose, Bld: 156 mg/dL — ABNORMAL HIGH (ref 70–99)
Potassium: 3.4 mmol/L — ABNORMAL LOW (ref 3.5–5.1)
Potassium: 3.6 mmol/L (ref 3.5–5.1)
Sodium: 130 mmol/L — ABNORMAL LOW (ref 135–145)
Sodium: 130 mmol/L — ABNORMAL LOW (ref 135–145)

## 2021-08-22 LAB — PREPARE RBC (CROSSMATCH)

## 2021-08-22 MED ORDER — FUROSEMIDE 40 MG PO TABS
40.0000 mg | ORAL_TABLET | Freq: Every day | ORAL | Status: DC
  Administered 2021-08-22 – 2021-08-24 (×3): 40 mg via ORAL
  Filled 2021-08-22 (×3): qty 1

## 2021-08-22 MED ORDER — FUROSEMIDE 10 MG/ML IJ SOLN
40.0000 mg | Freq: Once | INTRAMUSCULAR | Status: AC
  Administered 2021-08-22: 40 mg via INTRAVENOUS
  Filled 2021-08-22: qty 4

## 2021-08-22 MED ORDER — VITAMIN K1 10 MG/ML IJ SOLN
10.0000 mg | Freq: Once | INTRAVENOUS | Status: AC
  Administered 2021-08-22: 10 mg via INTRAVENOUS
  Filled 2021-08-22: qty 1

## 2021-08-22 MED ORDER — SODIUM CHLORIDE 0.9% IV SOLUTION: Freq: Once | INTRAVENOUS | Status: AC

## 2021-08-22 MED ORDER — POTASSIUM CHLORIDE CRYS ER 20 MEQ PO TBCR
40.0000 meq | EXTENDED_RELEASE_TABLET | Freq: Once | ORAL | Status: AC
  Administered 2021-08-22: 40 meq via ORAL
  Filled 2021-08-22: qty 2

## 2021-08-22 MED ORDER — INFLUENZA VAC SPLIT QUAD 0.5 ML IM SUSY
0.5000 mL | PREFILLED_SYRINGE | INTRAMUSCULAR | Status: DC | PRN
  Filled 2021-08-22: qty 0.5
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
## 2021-08-22 NOTE — Progress Notes (Signed)
[facility] Progress Note Patient Name: Victoria Gordon DOB: 08-27-1981 MRN: [id number]   Date of Service  08/22/2021  HPI/Events of Note  Multiple issues: 1. Anemia - Hgb = 5.1 and 2. Hypokalemia - K+ = 3.1 and Creatinine = 0.84.  eICU Interventions  Plan: Transfuse 2 units PRBC. Replace K+.     Intervention Category Major Interventions: Other:;Electrolyte abnormality - evaluation and management  Jaqwan Wieber Cornelia Copa 08/22/2021, 12:32 AM

## 2021-08-23 LAB — BPAM RBC
Blood Product Expiration Date: 202211032359
Blood Product Expiration Date: 202211042359
Blood Product Expiration Date: 202211212359
Blood Product Expiration Date: 202211222359
Blood Product Expiration Date: 202211232359
Blood Product Expiration Date: 202211252359
Blood Product Expiration Date: 202211262359
Blood Product Expiration Date: 202211272359
Blood Product Expiration Date: 202211302359
Blood Product Expiration Date: 202212012359
ISSUE DATE / TIME: 202210220202
ISSUE DATE / TIME: 202210221031
ISSUE DATE / TIME: 202210221456
ISSUE DATE / TIME: 202210221728
ISSUE DATE / TIME: 202210230042
ISSUE DATE / TIME: 202210230251
ISSUE DATE / TIME: 202210231212
Unit Type and Rh: 5100
Unit Type and Rh: 5100
Unit Type and Rh: 5100
Unit Type and Rh: 5100
Unit Type and Rh: 5100
Unit Type and Rh: 5100
Unit Type and Rh: 5100
Unit Type and Rh: 5100
Unit Type and Rh: 5100
Unit Type and Rh: 5100

## 2021-08-23 LAB — HEPATIC FUNCTION PANEL
ALT: 20 U/L (ref 0–44)
AST: 62 U/L — ABNORMAL HIGH (ref 15–41)
Albumin: 2.9 g/dL — ABNORMAL LOW (ref 3.5–5.0)
Alkaline Phosphatase: 49 U/L (ref 38–126)
Bilirubin, Direct: 6.6 mg/dL — ABNORMAL HIGH (ref 0.0–0.2)
Indirect Bilirubin: 11.2 mg/dL — ABNORMAL HIGH (ref 0.3–0.9)
Total Bilirubin: 17.8 mg/dL — ABNORMAL HIGH (ref 0.3–1.2)
Total Protein: 5.3 g/dL — ABNORMAL LOW (ref 6.5–8.1)

## 2021-08-23 LAB — TYPE AND SCREEN
ABO/RH(D): O POS
Antibody Screen: POSITIVE
Donor AG Type: NEGATIVE
Donor AG Type: NEGATIVE
Donor AG Type: NEGATIVE
Donor AG Type: NEGATIVE
Donor AG Type: NEGATIVE
Donor AG Type: NEGATIVE
Donor AG Type: NEGATIVE
Unit division: 0
Unit division: 0
Unit division: 0
Unit division: 0
Unit division: 0
Unit division: 0
Unit division: 0
Unit division: 0
Unit division: 0
Unit division: 0

## 2021-08-23 LAB — CBC
HCT: 24.7 % — ABNORMAL LOW (ref 36.0–46.0)
Hemoglobin: 7.8 g/dL — ABNORMAL LOW (ref 12.0–15.0)
MCH: 27.3 pg (ref 26.0–34.0)
MCHC: 31.6 g/dL (ref 30.0–36.0)
MCV: 86.4 fL (ref 80.0–100.0)
Platelets: 40 10*3/uL — ABNORMAL LOW (ref 150–400)
RBC: 2.86 MIL/uL — ABNORMAL LOW (ref 3.87–5.11)
RDW: 20.9 % — ABNORMAL HIGH (ref 11.5–15.5)
WBC: 3.7 10*3/uL — ABNORMAL LOW (ref 4.0–10.5)
nRBC: 1.3 % — ABNORMAL HIGH (ref 0.0–0.2)

## 2021-08-23 LAB — BASIC METABOLIC PANEL
Anion gap: 8 (ref 5–15)
BUN: 11 mg/dL (ref 6–20)
CO2: 23 mmol/L (ref 22–32)
Calcium: 7.4 mg/dL — ABNORMAL LOW (ref 8.9–10.3)
Chloride: 103 mmol/L (ref 98–111)
Creatinine, Ser: 0.62 mg/dL (ref 0.44–1.00)
GFR, Estimated: >60 mL/min (ref >60)
Glucose, Bld: 103 mg/dL — ABNORMAL HIGH (ref 70–99)
Potassium: 3.3 mmol/L — ABNORMAL LOW (ref 3.5–5.1)
Sodium: 134 mmol/L — ABNORMAL LOW (ref 135–145)

## 2021-08-23 LAB — PREPARE PLATELET PHERESIS: Unit division: 0

## 2021-08-23 LAB — GLUCOSE, CAPILLARY
Glucose-Capillary: 108 mg/dL — ABNORMAL HIGH (ref 70–99)
Glucose-Capillary: 115 mg/dL — ABNORMAL HIGH (ref 70–99)
Glucose-Capillary: 121 mg/dL — ABNORMAL HIGH (ref 70–99)
Glucose-Capillary: 99 mg/dL (ref 70–99)

## 2021-08-23 LAB — BLOOD PRODUCT ORDER (VERBAL) VERIFICATION

## 2021-08-23 LAB — PROTIME-INR
INR: 3.4 — ABNORMAL HIGH (ref 0.8–1.2)
Prothrombin Time: 34.6 seconds — ABNORMAL HIGH (ref 11.4–15.2)

## 2021-08-23 LAB — BPAM PLATELET PHERESIS
Blood Product Expiration Date: 202210242359
ISSUE DATE / TIME: 202210231728
Unit Type and Rh: 6200

## 2021-08-23 MED ORDER — POTASSIUM CHLORIDE CRYS ER 20 MEQ PO TBCR
40.0000 meq | EXTENDED_RELEASE_TABLET | ORAL | Status: AC
Start: 2021-08-23 — End: 2021-08-23
  Administered 2021-08-23 (×2): 40 meq via ORAL
  Filled 2021-08-23 (×2): qty 2

## 2021-08-23 MED ORDER — SPIRONOLACTONE 25 MG PO TABS
50.0000 mg | ORAL_TABLET | Freq: Every day | ORAL | Status: DC
  Administered 2021-08-23 – 2021-08-24 (×2): 50 mg via ORAL
  Filled 2021-08-23 (×4): qty 2

## 2021-08-23 MED ORDER — PHYTONADIONE 5 MG PO TABS
5.0000 mg | ORAL_TABLET | Freq: Once | ORAL | Status: AC
  Administered 2021-08-23: 5 mg via ORAL
  Filled 2021-08-23 (×2): qty 1
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
## 2021-08-23 NOTE — Progress Notes (Signed)
[facility] Progress Note Patient Name: Victoria Gordon DOB: 1981/05/26 MRN: [id number]   Date of Service  08/23/2021  HPI/Events of Note  Anemia - Hgb = 7.6 and 2. Thrombocytopenia - Platelets = 36. No evidence of active bleeding.   eICU Interventions  Plan: Hgb is satisfactory. Transfuse platelets for active bleeding or Platelets < 10.     Intervention Category Major Interventions: Other: Intermediate Interventions: Thrombocytopenia - evaluation and management  Lysle Dingwall 08/23/2021, 12:17 AM
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
## 2021-08-23 NOTE — Consult Note (Signed)
   [facility] Inpatient Consult   08/23/2021  Victoria Gordon [birth year] [id number]    Thank you for this referral.  Unfortunately, our [facility] management team will not be able to assist you with this patient. The patient is not on the current member enrollment rosters for any of the [place] risk contracted plans.    Reason:  Not a beneficiary currently attributed to one of the [place] populations/physician and is showing as not having primary care provider.  For questions, please contact:  Natividad Brood, RN BSN [facility] Liaison  [phone] business mobile phone Toll free office [phone]  Fax number: [phone] [email or web address]

## 2021-08-24 DIAGNOSIS — K7460 Unspecified cirrhosis of liver: Secondary | ICD-10-CM

## 2021-08-24 DIAGNOSIS — Z8619 Personal history of other infectious and parasitic diseases: Secondary | ICD-10-CM

## 2021-08-24 DIAGNOSIS — D500 Iron deficiency anemia secondary to blood loss (chronic): Secondary | ICD-10-CM

## 2021-08-24 DIAGNOSIS — D649 Anemia, unspecified: Secondary | ICD-10-CM

## 2021-08-24 DIAGNOSIS — K7031 Alcoholic cirrhosis of liver with ascites: Secondary | ICD-10-CM

## 2021-08-24 DIAGNOSIS — K7682 Hepatic encephalopathy: Secondary | ICD-10-CM

## 2021-08-24 DIAGNOSIS — K7290 Hepatic failure, unspecified without coma: Secondary | ICD-10-CM

## 2021-08-24 LAB — CBC
HCT: 27.1 % — ABNORMAL LOW (ref 36.0–46.0)
Hemoglobin: 8.6 g/dL — ABNORMAL LOW (ref 12.0–15.0)
MCH: 28 pg (ref 26.0–34.0)
MCHC: 31.7 g/dL (ref 30.0–36.0)
MCV: 88.3 fL (ref 80.0–100.0)
Platelets: 41 10*3/uL — ABNORMAL LOW (ref 150–400)
RBC: 3.07 MIL/uL — ABNORMAL LOW (ref 3.87–5.11)
RDW: 21.6 % — ABNORMAL HIGH (ref 11.5–15.5)
WBC: 3.5 10*3/uL — ABNORMAL LOW (ref 4.0–10.5)
nRBC: 0.9 % — ABNORMAL HIGH (ref 0.0–0.2)

## 2021-08-24 LAB — AFP TUMOR MARKER: AFP, Serum, Tumor Marker: 3.2 ng/mL (ref 0.0–6.4)

## 2021-08-24 LAB — PROTIME-INR
INR: 3.8 — ABNORMAL HIGH (ref 0.8–1.2)
Prothrombin Time: 37.8 seconds — ABNORMAL HIGH (ref 11.4–15.2)

## 2021-08-24 LAB — BASIC METABOLIC PANEL
Anion gap: 5 (ref 5–15)
BUN: 5 mg/dL — ABNORMAL LOW (ref 6–20)
CO2: 23 mmol/L (ref 22–32)
Calcium: 7.3 mg/dL — ABNORMAL LOW (ref 8.9–10.3)
Chloride: 106 mmol/L (ref 98–111)
Creatinine, Ser: 0.47 mg/dL (ref 0.44–1.00)
GFR, Estimated: >60 mL/min (ref >60)
Glucose, Bld: 114 mg/dL — ABNORMAL HIGH (ref 70–99)
Potassium: 3.4 mmol/L — ABNORMAL LOW (ref 3.5–5.1)
Sodium: 134 mmol/L — ABNORMAL LOW (ref 135–145)

## 2021-08-24 LAB — PATHOLOGIST SMEAR REVIEW

## 2021-08-24 LAB — CYTOLOGY - NON PAP

## 2021-08-24 LAB — MAGNESIUM: Magnesium: 1.5 mg/dL — ABNORMAL LOW (ref 1.7–2.4)

## 2021-08-24 LAB — PHOSPHORUS: Phosphorus: 2.3 mg/dL — ABNORMAL LOW (ref 2.5–4.6)

## 2021-08-24 MED ORDER — LIDOCAINE 5 % EX PTCH
1.0000 | MEDICATED_PATCH | CUTANEOUS | Status: AC
Start: 2021-08-24 — End: 2021-08-30
  Administered 2021-08-24 – 2021-08-29 (×5): 1 via TRANSDERMAL
  Filled 2021-08-24 (×7): qty 1

## 2021-08-24 MED ORDER — PHYTONADIONE 5 MG PO TABS
5.0000 mg | ORAL_TABLET | Freq: Every day | ORAL | Status: DC
  Administered 2021-08-24: 5 mg via ORAL
  Filled 2021-08-24 (×2): qty 1

## 2021-08-24 MED ORDER — SPIRONOLACTONE 100 MG PO TABS
100.0000 mg | ORAL_TABLET | Freq: Every day | ORAL | Status: DC
  Administered 2021-08-25 – 2021-08-26 (×2): 100 mg via ORAL
  Filled 2021-08-24 (×2): qty 1

## 2021-08-24 MED ORDER — POTASSIUM PHOSPHATES 15 MMOLE/5ML IV SOLN
15.0000 mmol | Freq: Once | INTRAVENOUS | Status: AC
  Administered 2021-08-24: 15 mmol via INTRAVENOUS
  Filled 2021-08-24: qty 5

## 2021-08-24 MED ORDER — MAGNESIUM SULFATE 4 GM/100ML IV SOLN
4.0000 g | Freq: Once | INTRAVENOUS | Status: AC
  Administered 2021-08-24: 4 g via INTRAVENOUS
  Filled 2021-08-24: qty 100

## 2021-08-24 MED ORDER — FUROSEMIDE 40 MG PO TABS
40.0000 mg | ORAL_TABLET | Freq: Two times a day (BID) | ORAL | Status: DC
  Administered 2021-08-24 – 2021-08-30 (×12): 40 mg via ORAL
  Filled 2021-08-24 (×12): qty 1

## 2021-08-24 MED ORDER — PANTOPRAZOLE SODIUM 40 MG PO TBEC
40.0000 mg | DELAYED_RELEASE_TABLET | Freq: Two times a day (BID) | ORAL | Status: DC
  Administered 2021-08-24 – 2021-08-28 (×10): 40 mg via ORAL
  Filled 2021-08-24 (×10): qty 1
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
## 2021-08-24 NOTE — Progress Notes (Signed)
Patient transferred to 6N26 via wheelchair

## 2021-08-24 NOTE — Progress Notes (Signed)
Mobility Specialist Progress Note:   08/24/21 1512  Mobility  Activity Ambulated in hall  Level of Assistance Standby assist, set-up cues, supervision of patient - no hands on  Assistive Device None  Distance Ambulated (ft) 200 ft  Mobility Ambulated with assistance in hallway  Mobility Response Tolerated well  Mobility performed by Mobility specialist  $Mobility charge 1 Mobility   Pt received in bed willing to participate in mobility. Complaints of dizziness during ambulation. Pt returned to bed with call bell in reach and all needs met.   [facility] Health and safety inspector Phone [phone]

## 2021-08-24 NOTE — Progress Notes (Signed)
[facility] ADULT ICU REPLACEMENT PROTOCOL   The patient does apply for the [facility] Adult ICU Electrolyte Replacment Protocol based on the criteria listed below:   1.Exclusion criteria: TCTS patients, ECMO patients, and Dialysis patients 2. Is GFR >/= 30 ml/min? Yes.    Patient's GFR today is >60 3. Is SCr </= 2? Yes.   Patient's SCr is 0.47 mg/dL 4. Did SCr increase >/= 0.5 in 24 hours? No. 5.Pt's weight >40kg  Yes.   6. Abnormal electrolyte(s): K+ 3.4, Phos 2.3, Mag 1.5  7. Electrolytes replaced per protocol 8.  Call MD STAT for K+ </= 2.5, Phos </= 1, or Mag </= 1 Physician:  Dr Otilio Jefferson, [place] Grumbling 08/24/2021 4:34 AM

## 2021-08-24 NOTE — Plan of Care (Signed)
  Problem: Pain Managment: Goal: General experience of comfort will improve Outcome: Progressing   Problem: Safety: Goal: Ability to remain free from injury will improve Outcome: Progressing   

## 2021-08-25 DIAGNOSIS — T1490XA Injury, unspecified, initial encounter: Secondary | ICD-10-CM

## 2021-08-25 LAB — CULTURE, BLOOD (ROUTINE X 2)
Culture: NO GROWTH
Culture: NO GROWTH
Special Requests: ADEQUATE

## 2021-08-25 LAB — COMPREHENSIVE METABOLIC PANEL
ALT: 16 U/L (ref 0–44)
AST: 50 U/L — ABNORMAL HIGH (ref 15–41)
Albumin: 2.5 g/dL — ABNORMAL LOW (ref 3.5–5.0)
Alkaline Phosphatase: 82 U/L (ref 38–126)
Anion gap: 5 (ref 5–15)
BUN: <5 mg/dL — ABNORMAL LOW (ref 6–20)
CO2: 26 mmol/L (ref 22–32)
Calcium: 7.2 mg/dL — ABNORMAL LOW (ref 8.9–10.3)
Chloride: 102 mmol/L (ref 98–111)
Creatinine, Ser: 0.5 mg/dL (ref 0.44–1.00)
GFR, Estimated: >60 mL/min (ref >60)
Glucose, Bld: 112 mg/dL — ABNORMAL HIGH (ref 70–99)
Potassium: 3 mmol/L — ABNORMAL LOW (ref 3.5–5.1)
Sodium: 133 mmol/L — ABNORMAL LOW (ref 135–145)
Total Bilirubin: 14.4 mg/dL — ABNORMAL HIGH (ref 0.3–1.2)
Total Protein: 4.9 g/dL — ABNORMAL LOW (ref 6.5–8.1)

## 2021-08-25 LAB — BODY FLUID CULTURE W GRAM STAIN
Culture: NO GROWTH
Gram Stain: NONE SEEN

## 2021-08-25 LAB — CBC
HCT: 28.8 % — ABNORMAL LOW (ref 36.0–46.0)
Hemoglobin: 9.2 g/dL — ABNORMAL LOW (ref 12.0–15.0)
MCH: 28.1 pg (ref 26.0–34.0)
MCHC: 31.9 g/dL (ref 30.0–36.0)
MCV: 88.1 fL (ref 80.0–100.0)
Platelets: 42 10*3/uL — ABNORMAL LOW (ref 150–400)
RBC: 3.27 MIL/uL — ABNORMAL LOW (ref 3.87–5.11)
RDW: 22.5 % — ABNORMAL HIGH (ref 11.5–15.5)
WBC: 4 10*3/uL (ref 4.0–10.5)
nRBC: 0.8 % — ABNORMAL HIGH (ref 0.0–0.2)

## 2021-08-25 LAB — PROTIME-INR
INR: 4.3 (ref 0.8–1.2)
Prothrombin Time: 41.3 seconds — ABNORMAL HIGH (ref 11.4–15.2)

## 2021-08-25 MED ORDER — VITAMIN K1 10 MG/ML IJ SOLN
1.0000 mg | Freq: Once | INTRAVENOUS | Status: AC
  Administered 2021-08-25: 1 mg via INTRAVENOUS
  Filled 2021-08-25: qty 0.1

## 2021-08-25 MED ORDER — POTASSIUM CHLORIDE CRYS ER 20 MEQ PO TBCR
40.0000 meq | EXTENDED_RELEASE_TABLET | Freq: Two times a day (BID) | ORAL | Status: DC
  Administered 2021-08-25 – 2021-08-30 (×11): 40 meq via ORAL
  Filled 2021-08-25 (×11): qty 2

## 2021-08-25 MED ORDER — CIPROFLOXACIN HCL 500 MG PO TABS
500.0000 mg | ORAL_TABLET | Freq: Every day | ORAL | Status: DC
  Administered 2021-08-25 – 2021-08-30 (×6): 500 mg via ORAL
  Filled 2021-08-25 (×6): qty 1

## 2021-08-25 NOTE — Progress Notes (Signed)
Mobility Specialist Progress Note:   08/25/21 1445  Mobility  Activity Ambulated in hall  Level of Assistance Independent  Assistive Device None  Distance Ambulated (ft) 300 ft  Mobility Ambulated independently in hallway  Mobility Response Tolerated well  Mobility performed by Mobility specialist  $Mobility charge 1 Mobility   Pre Mobility: SpO2 98% Post Mobility: SpO2 100%  Pt c/o the feeling of "something sitting on my chest". Displayed minor SOB during ambulation, otherwise asx. Gets up and goes to the BR independently.   Nelta Numbers Mobility Specialist  Phone [phone]
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
## 2021-08-25 NOTE — Progress Notes (Signed)
Progress Note   Subjective  Chief Complaint: Anemia, cirrhosis  Today, patient tells me that she continues to have mostly epigastric and right upper quadrant pain similar to yesterday.  She has not had a bowel movement overnight.  Denies any new complaints or concerns.  In general feeling better.   Objective   Vital signs in last 24 hours: Temp:  [98.3 F (36.8 C)-98.8 F (37.1 C)] 98.7 F (37.1 C) (10/26 0752) Pulse Rate:  [64-73] 71 (10/26 0752) Resp:  [18-20] 18 (10/26 0752) BP: (102-108)/(58-70) 102/58 (10/26 0752) SpO2:  [98 %-100 %] 100 % (10/26 0752) Last BM Date: 08/24/21 General:    Ill-appearing female in NAD with scleral icterus Heart:  Regular rate and rhythm; systolic murmur Lungs: Respirations even and unlabored, diminished in the right lower lobe Abdomen:  Soft, mild epigastric TTP and mild distention. Normal bowel sounds.  Right lower quadrant paracentesis site with ostomy bag with a small amount of watery red/orange drainage Extremities: Very mild lower extremity edema Neurologic:  Alert and oriented,  grossly normal neurologically.  No asterixis Psych:  Cooperative. Normal mood and affect.  Intake/Output from previous day: 10/25 0701 - 10/26 0700 In: 1595.1 [P.O.:1260; I.V.:171.3; IV Piggyback:163.8] Out: 550 [Urine:200] Intake/Output this shift: Total I/O In: 240 [P.O.:240] Out: -   Lab Results: Recent Labs    08/23/21 0719 08/24/21 0307 08/25/21 0206  WBC 3.7* 3.5* 4.0  HGB 7.8* 8.6* 9.2*  HCT 24.7* 27.1* 28.8*  PLT 40* 41* 42*   BMET Recent Labs    08/23/21 0719 08/24/21 0307 08/25/21 0206  NA 134* 134* 133*  K 3.3* 3.4* 3.0*  CL 103 106 102  CO2 23 23 26   GLUCOSE 103* 114* 112*  BUN 11 5* <5*  CREATININE 0.62 0.47 0.50  CALCIUM 7.4* 7.3* 7.2*   LFT Recent Labs    08/23/21 0719 08/25/21 0206  PROT 5.3* 4.9*  ALBUMIN 2.9* 2.5*  AST 62* 50*  ALT 20 16  ALKPHOS 49 82  BILITOT 17.8* 14.4*  BILIDIR 6.6*  --   IBILI 11.2*   --    PT/INR Recent Labs    08/24/21 0307 08/25/21 0206  LABPROT 37.8* 41.3*  INR 3.8* 4.3*    Assessment / Plan:   Assessment: 1.  Decompensated alcoholic cirrhosis: Complicated with ascites, portal hypertension, esophageal varices, splenomegaly, scleral icterus and abdominal pain, MDF greater than 32-not on prednisolone, MELD-NA 36, not a transplant candidate secondary to alcohol abuse (reported abstinence x2 months), CTAP 10/21 showed cirrhosis, portal hypertension, large varices, moderate ascites, splenomegaly and bilateral pleural effusions, status post paracentesis 10/20 with 500 cc of peritoneal fluid removed without SBP, on midodrine, received IV albumin 25 g IV every 6 hours x4 doses, right abdominal paracentesis site continues to drain bloody peritoneal fluid, no overt hepatic encephalopathy, T bili improved to 14.4 today 2.  Acute on chronic anemia: Felt multifactorial, rectal bleeding reported black stool, cirrhosis with splenomegaly and likely bone marrow suppression secondary to alcohol abuse, admission hemoglobin to--> transfused 7 units PRBCs since admission--> hemoglobin 7.6 on 10/23--> 7.8--> 8.6--> 9.2 today, octreotide has been DC'd, continue PPI IV twice daily, patient received 1 mg of IV vitamin K today 3.  Ascites/anasarca/hepatic hydrothorax 4.  Hepatic encephalopathy 5.  History of SBP  Plan: 1.  If INR does not respond to the 1 mg of IV vitamin K given today would consider giving 5 mg IV tomorrow as she was not able to absorb p.o. form 2.  Continue  to await possible EGD with variceal banding once INR closer to 2 3.  Continue Pantoprazole 40 mg twice daily 4.  Continue Rocephin x5 days, then switched to Ciprofloxacin 500 mg p.o. daily for SBP prophylaxis 5.  Again we will reassess the need for colonoscopy later this week 6.  Patient will need reengagement about rehabilitation closer to discharge if she is not a transplant candidate currently due to recent alcohol  consumption 7.  Continue Lasix 40 mg p.o. twice daily and Aldactone 100 mg daily 8.  Continue low-sodium/high-protein diet 9.  Again recommend PT work with patient to prevent further deconditioning 10.  Continue Lactulose 20 mg twice daily for now, patient did not report frequent loose stools overnight 11.  Continue Rifaximin 550 mg twice daily 12.  Recheck hepatic panel tomorrow  Thank you for your kind consultation, we will continue to follow.    LOS: 5 days   Levin Erp  08/25/2021, 10:17 AM

## 2021-08-25 NOTE — Progress Notes (Signed)
Critical lab called PT 41.3 INR 4.3 md paged messaged on critical lab

## 2021-08-26 DIAGNOSIS — D689 Coagulation defect, unspecified: Secondary | ICD-10-CM

## 2021-08-26 LAB — COMPREHENSIVE METABOLIC PANEL
ALT: 14 U/L (ref 0–44)
AST: 49 U/L — ABNORMAL HIGH (ref 15–41)
Albumin: 2.1 g/dL — ABNORMAL LOW (ref 3.5–5.0)
Alkaline Phosphatase: 86 U/L (ref 38–126)
Anion gap: 5 (ref 5–15)
BUN: 5 mg/dL — ABNORMAL LOW (ref 6–20)
CO2: 23 mmol/L (ref 22–32)
Calcium: 7.2 mg/dL — ABNORMAL LOW (ref 8.9–10.3)
Chloride: 104 mmol/L (ref 98–111)
Creatinine, Ser: 0.5 mg/dL (ref 0.44–1.00)
GFR, Estimated: >60 mL/min (ref >60)
Glucose, Bld: 95 mg/dL (ref 70–99)
Potassium: 3.5 mmol/L (ref 3.5–5.1)
Sodium: 132 mmol/L — ABNORMAL LOW (ref 135–145)
Total Bilirubin: 13.2 mg/dL — ABNORMAL HIGH (ref 0.3–1.2)
Total Protein: 4.5 g/dL — ABNORMAL LOW (ref 6.5–8.1)

## 2021-08-26 LAB — CBC
HCT: 28 % — ABNORMAL LOW (ref 36.0–46.0)
Hemoglobin: 8.9 g/dL — ABNORMAL LOW (ref 12.0–15.0)
MCH: 28.2 pg (ref 26.0–34.0)
MCHC: 31.8 g/dL (ref 30.0–36.0)
MCV: 88.6 fL (ref 80.0–100.0)
Platelets: 39 10*3/uL — ABNORMAL LOW (ref 150–400)
RBC: 3.16 MIL/uL — ABNORMAL LOW (ref 3.87–5.11)
RDW: 23.7 % — ABNORMAL HIGH (ref 11.5–15.5)
WBC: 4.3 10*3/uL (ref 4.0–10.5)
nRBC: 0 % (ref 0.0–0.2)

## 2021-08-26 LAB — PROTIME-INR
INR: 4.2 (ref 0.8–1.2)
Prothrombin Time: 40.6 seconds — ABNORMAL HIGH (ref 11.4–15.2)

## 2021-08-26 MED ORDER — VITAMIN K1 10 MG/ML IJ SOLN
5.0000 mg | Freq: Once | INTRAVENOUS | Status: AC
  Administered 2021-08-26: 5 mg via INTRAVENOUS
  Filled 2021-08-26: qty 0.5

## 2021-08-26 MED ORDER — SPIRONOLACTONE 100 MG PO TABS
100.0000 mg | ORAL_TABLET | Freq: Once | ORAL | Status: AC
  Administered 2021-08-26: 100 mg via ORAL
  Filled 2021-08-26: qty 1

## 2021-08-26 MED ORDER — SPIRONOLACTONE 100 MG PO TABS
100.0000 mg | ORAL_TABLET | Freq: Two times a day (BID) | ORAL | Status: DC
  Administered 2021-08-27 – 2021-08-30 (×7): 100 mg via ORAL
  Filled 2021-08-26 (×7): qty 1

## 2021-08-26 NOTE — Progress Notes (Addendum)
Daily Rounding Note  08/26/2021, 2:33 PM  LOS: 6 days   SUBJECTIVE:   Chief complaint:     Feels ok, + tired.  Winded w walking in hall.  3 BM's today.  No abdominal pain.   Still putting out deep orange ascites from R abdomen.  Ascites 350 mL 2 d ago and no entries for output yest or today  OBJECTIVE:         Vital signs in last 24 hours:    Temp:  [98.3 F (36.8 C)-98.7 F (37.1 C)] 98.7 F (37.1 C) (10/27 0752) Pulse Rate:  [68-75] 73 (10/27 0752) Resp:  [17-19] 19 (10/27 0752) BP: (103-112)/(55-70) 103/55 (10/27 0752) SpO2:  [98 %-100 %] 98 % (10/27 0752) Weight:  [90.9 kg] 90.9 kg (10/27 0510) Last BM Date: 08/24/21 Filed Weights   08/23/21 0308 08/24/21 0500 08/26/21 0510  Weight: 92.2 kg 87.6 kg 90.9 kg   General: Jaundiced, chronically and acutely ill-appearing.  Looks tired. Heart: RRR with soft harsh systolic murmur Chest: Absent breath sounds on the right, clear on the left.  No labored breathing at rest.  No cough Abdomen: Distended, moderately tense.  Not tender.  Deeply orange ascitic fluid draining into ostomy type collection bag on right abdomen. Extremities: Minor lower extremity edema.  Nonpitting swelling in the upper extremities. Neuro/Psych: Appropriate.  Affect flat but overall pleasant.  Alert x3.  Moves all 4 limbs.  No tremor, no asterixis.  Intake/Output from previous day: 10/26 0701 - 10/27 0700 In: 480 [P.O.:480] Out: -   Intake/Output this shift: Total I/O In: 700 [P.O.:700] Out: -   Lab Results: Recent Labs    08/24/21 0307 08/25/21 0206 08/26/21 0607  WBC 3.5* 4.0 4.3  HGB 8.6* 9.2* 8.9*  HCT 27.1* 28.8* 28.0*  PLT 41* 42* 39*   BMET Recent Labs    08/24/21 0307 08/25/21 0206 08/26/21 0116  NA 134* 133* 132*  K 3.4* 3.0* 3.5  CL 106 102 104  CO2 23 26 23   GLUCOSE 114* 112* 95  BUN 5* <5* 5*  CREATININE 0.47 0.50 0.50  CALCIUM 7.3* 7.2* 7.2*   LFT Recent  Labs    08/25/21 0206 08/26/21 0116  PROT 4.9* 4.5*  ALBUMIN 2.5* 2.1*  AST 50* 49*  ALT 16 14  ALKPHOS 82 86  BILITOT 14.4* 13.2*   PT/INR Recent Labs    08/25/21 0206 08/26/21 0529  LABPROT 41.3* 40.6*  INR 4.3* 4.2*   Hepatitis Panel No results for input(s): HEPBSAG, HCVAB, HEPAIGM, HEPBIGM in the last 72 hours.  Studies/Results: No results found.  Scheduled Meds:  Chlorhexidine Gluconate Cloth  6 each Topical Daily   ciprofloxacin  500 mg Oral Q breakfast   feeding supplement  1 Container Oral TID BM   folic acid  1 mg Oral Daily   furosemide  40 mg Oral BID   lactulose  20 g Oral BID   lidocaine  1 patch Transdermal Q24H   mouth rinse  15 mL Mouth Rinse BID   midodrine  5 mg Oral TID WC   multivitamin with minerals  1 tablet Oral Daily   pantoprazole  40 mg Oral BID   potassium chloride  40 mEq Oral BID   rifaximin  550 mg Oral BID   spironolactone  100 mg Oral Daily   Continuous Infusions:  sodium chloride     sodium chloride 10 mL/hr at 08/24/21 1000   PRN Meds:.sodium  chloride, sodium chloride, diphenhydrAMINE, influenza vac split quadrivalent PF, ondansetron (ZOFRAN) IV   ASSESMENT:   Decompensated alcoholic cirrhosis.  Superimposed ETOH hepatitis?  MELD-Na 36.. 33 today.  Not a transplant candidate.  T bili 17.8 ... 13.2.       Coagulopathy.  INR 4.2.  1 mg Vit K IV on 10/26, 5 mg IV today.    Anemia, FOBT +.  Hgb 8.9.  Splenic, gastric, esophageal varices.  Patent portal vein.  No EGD yet due to elevated INR.    Bilateral pleural effusions, R >> L.    Nonspecific colon wall thickening per CT scan.  Chronic rectal bleeding.  Previous hemorroidectomy.    Ascites, diffuse anasarca.  1.9 L tap on 06/07/21, 5.3 L tap  on 07/15/21, 500 mL tap on 10/22.  Lasix 40 bid, spironolactone 100 mg/daily in place w no associated deranged renal function.      Hx SBP, on po Cipro prophylaxis.    HE.  Lactulose, rifaximin in place.    Hyponatremia.  Na 132.     Splenomegaly.  Thrombocytopenia, 39K.     PLAN      EGD, timing?  Paracentesis?  Thoracentesis?  All procedures hinge on correction of INR.   Increase Aldactone to 100 bid.  Leave lasix at 40 mg bid.     Victoria Gordon  08/26/2021, 2:33 PM Phone [phone]

## 2021-08-26 NOTE — Plan of Care (Signed)

## 2021-08-26 NOTE — Progress Notes (Signed)
Mobility Specialist Progress Note:   08/26/21 1430  Mobility  Activity Ambulated in hall  Level of Assistance Independent  Assistive Device None  Distance Ambulated (ft) 560 ft  Mobility Ambulated independently in hallway  Mobility Response Tolerated well  Mobility performed by Mobility specialist  $Mobility charge 1 Mobility   2nd session with pt today. Pt displayed SOB during ambulation. C/o no pain.   Nelta Numbers Mobility Specialist  Phone [phone]

## 2021-08-26 NOTE — Progress Notes (Signed)
Mobility Specialist Progress Note:   08/26/21 1015  Mobility  Activity Ambulated in hall  Level of Assistance Independent  Assistive Device None  Distance Ambulated (ft) 550 ft  Mobility Ambulated independently in hallway  Mobility Response Tolerated well  Mobility performed by Mobility specialist  $Mobility charge 1 Mobility   Pt asx during ambulation. Still moving independently around room.  Nelta Numbers Mobility Specialist  Phone [phone]
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
## 2021-08-27 ENCOUNTER — Inpatient Hospital Stay (HOSPITAL_COMMUNITY): Payer: Medicaid Other

## 2021-08-27 DIAGNOSIS — R079 Chest pain, unspecified: Secondary | ICD-10-CM

## 2021-08-27 LAB — ECHOCARDIOGRAM COMPLETE
AR max vel: 1.8 cm2
AV Peak grad: 21.7 mmHg
Ao pk vel: 2.33 m/s
Area-P 1/2: 2.87 cm2
Calc EF: 63.2 %
Height: 61 in
S' Lateral: 3.3 cm
Single Plane A2C EF: 60.3 %
Single Plane A4C EF: 66.4 %
Weight: 3206.37 oz

## 2021-08-27 LAB — COMPREHENSIVE METABOLIC PANEL
ALT: 16 U/L (ref 0–44)
AST: 52 U/L — ABNORMAL HIGH (ref 15–41)
Albumin: 2.3 g/dL — ABNORMAL LOW (ref 3.5–5.0)
Alkaline Phosphatase: 92 U/L (ref 38–126)
Anion gap: 4 — ABNORMAL LOW (ref 5–15)
BUN: 5 mg/dL — ABNORMAL LOW (ref 6–20)
CO2: 23 mmol/L (ref 22–32)
Calcium: 7.7 mg/dL — ABNORMAL LOW (ref 8.9–10.3)
Chloride: 104 mmol/L (ref 98–111)
Creatinine, Ser: 0.32 mg/dL — ABNORMAL LOW (ref 0.44–1.00)
GFR, Estimated: >60 mL/min (ref >60)
Glucose, Bld: 107 mg/dL — ABNORMAL HIGH (ref 70–99)
Potassium: 4 mmol/L (ref 3.5–5.1)
Sodium: 131 mmol/L — ABNORMAL LOW (ref 135–145)
Total Bilirubin: 14.3 mg/dL — ABNORMAL HIGH (ref 0.3–1.2)
Total Protein: 5 g/dL — ABNORMAL LOW (ref 6.5–8.1)

## 2021-08-27 LAB — PROTIME-INR
INR: 3.8 — ABNORMAL HIGH (ref 0.8–1.2)
Prothrombin Time: 37.2 seconds — ABNORMAL HIGH (ref 11.4–15.2)

## 2021-08-27 LAB — TYPE AND SCREEN
ABO/RH(D): O POS
Antibody Screen: POSITIVE
Donor AG Type: NEGATIVE
Donor AG Type: NEGATIVE
Unit division: 0
Unit division: 0
Unit division: 0

## 2021-08-27 LAB — BPAM RBC
Blood Product Expiration Date: 202211222359
Blood Product Expiration Date: 202211252359
Blood Product Expiration Date: 202212012359
Unit Type and Rh: 5100
Unit Type and Rh: 5100
Unit Type and Rh: 5100

## 2021-08-27 LAB — CBC
HCT: 29.7 % — ABNORMAL LOW (ref 36.0–46.0)
Hemoglobin: 9.2 g/dL — ABNORMAL LOW (ref 12.0–15.0)
MCH: 28.3 pg (ref 26.0–34.0)
MCHC: 31 g/dL (ref 30.0–36.0)
MCV: 91.4 fL (ref 80.0–100.0)
Platelets: 40 10*3/uL — ABNORMAL LOW (ref 150–400)
RBC: 3.25 MIL/uL — ABNORMAL LOW (ref 3.87–5.11)
RDW: 24.6 % — ABNORMAL HIGH (ref 11.5–15.5)
WBC: 5 10*3/uL (ref 4.0–10.5)
nRBC: 0 % (ref 0.0–0.2)

## 2021-08-27 MED ORDER — VITAMIN K1 10 MG/ML IJ SOLN
10.0000 mg | Freq: Once | INTRAVENOUS | Status: AC
  Administered 2021-08-27: 10 mg via INTRAVENOUS
  Filled 2021-08-27 (×3): qty 1

## 2021-08-27 MED ORDER — BISACODYL 5 MG PO TBEC
20.0000 mg | DELAYED_RELEASE_TABLET | Freq: Once | ORAL | Status: AC
  Administered 2021-08-28: 20 mg via ORAL
  Filled 2021-08-27: qty 4

## 2021-08-27 MED ORDER — PEG-KCL-NACL-NASULF-NA ASC-C 100 G PO SOLR: 1.0000 | Freq: Once | ORAL | Status: DC

## 2021-08-27 MED ORDER — PHYTONADIONE 5 MG PO TABS: 5.0000 mg | ORAL_TABLET | Freq: Every day | ORAL | Status: DC

## 2021-08-27 MED ORDER — PEG-KCL-NACL-NASULF-NA ASC-C 100 G PO SOLR
0.5000 | Freq: Once | ORAL | Status: AC
  Administered 2021-08-29: 100 g via ORAL
  Filled 2021-08-27: qty 1

## 2021-08-27 MED ORDER — METOCLOPRAMIDE HCL 5 MG/ML IJ SOLN
10.0000 mg | Freq: Four times a day (QID) | INTRAMUSCULAR | Status: AC
  Administered 2021-08-28 (×2): 10 mg via INTRAVENOUS
  Filled 2021-08-27 (×2): qty 2

## 2021-08-27 MED ORDER — PEG-KCL-NACL-NASULF-NA ASC-C 100 G PO SOLR
0.5000 | Freq: Once | ORAL | Status: AC
  Administered 2021-08-28: 100 g via ORAL
  Filled 2021-08-27: qty 1

## 2021-08-27 NOTE — Progress Notes (Signed)
Mobility Specialist Progress Note:   08/27/21 1115  Mobility  Activity Ambulated in hall  Level of Assistance Independent  Assistive Device None  Distance Ambulated (ft) 320 ft  Mobility Ambulated independently in hallway  Mobility Response Tolerated fair  Mobility performed by Mobility specialist  $Mobility charge 1 Mobility   Pt displayed more SOB after ambulating a shorter distance today. Distance limited d/t fatigue.   Nelta Numbers Mobility Specialist  Phone [phone]

## 2021-08-27 NOTE — Plan of Care (Signed)

## 2021-08-27 NOTE — Progress Notes (Addendum)
Daily Rounding Note  08/27/2021, 10:04 AM  LOS: 7 days   SUBJECTIVE:   Chief complaint:  decompensated cirrhosis.   Bloody stool    Bloody stools persist.  No abdominal pain at rest but TTP and pain w engagement of abdominal muscles (sitting up for instance).  Eating well.  No nausea.  DOE when walking.  No cough  OBJECTIVE:         Vital signs in last 24 hours:    Temp:  [97.8 F (36.6 C)-98.9 F (37.2 C)] 98.6 F (37 C) (10/28 0736) Pulse Rate:  [71-80] 73 (10/28 0736) Resp:  [17-19] 18 (10/28 0736) BP: (106-119)/(68-75) 119/75 (10/28 0736) SpO2:  [97 %-100 %] 100 % (10/28 0736) Last BM Date: 08/26/21 Filed Weights   08/23/21 0308 08/24/21 0500 08/26/21 0510  Weight: 92.2 kg 87.6 kg 90.9 kg   General: jaundiced, sallow, ill appearing.  Comfortable.  Alert.     Heart: RRR Chest: R breath sounds nearly absent.   Abdomen: ND.  Diffuse mild tenderness throughout.  Active BS.  Soft.    Extremities: No CCE.   Neuro/Psych:  oriented fully.  Alert.  No tremor or asterixis.  Affect flat but cooperative, pleasant.    Intake/Output from previous day: 10/27 0701 - 10/28 0700 In: 1608.2 [P.O.:1040; I.V.:568.2] Out: -   Intake/Output this shift: Total I/O In: 220 [P.O.:220] Out: -   Lab Results: Recent Labs    08/25/21 0206 08/26/21 0607 08/27/21 0211  WBC 4.0 4.3 5.0  HGB 9.2* 8.9* 9.2*  HCT 28.8* 28.0* 29.7*  PLT 42* 39* 40*   BMET Recent Labs    08/25/21 0206 08/26/21 0116 08/27/21 0211  NA 133* 132* 131*  K 3.0* 3.5 4.0  CL 102 104 104  CO2 26 23 23   GLUCOSE 112* 95 107*  BUN <5* 5* 5*  CREATININE 0.50 0.50 0.32*  CALCIUM 7.2* 7.2* 7.7*   LFT Recent Labs    08/25/21 0206 08/26/21 0116 08/27/21 0211  PROT 4.9* 4.5* 5.0*  ALBUMIN 2.5* 2.1* 2.3*  AST 50* 49* 52*  ALT 16 14 16   ALKPHOS 82 86 92  BILITOT 14.4* 13.2* 14.3*   PT/INR Recent Labs    08/26/21 0529 08/27/21 0211  LABPROT  40.6* 37.2*  INR 4.2* 3.8*   Hepatitis Panel No results for input(s): HEPBSAG, HCVAB, HEPAIGM, HEPBIGM in the last 72 hours.  Studies/Results: No results found.  ASSESMENT:   Decompensated cirrhosis due to alcohol.  Meld-Na 36.  Ascites, hepatic hydrothorax, anasarca.  Now receiving Lasix 40 bid, spironolactone 100 mg bid.  Renal function preserved.    Hx SBP, Cipro prophylaxis in place.  Diagnostic tap at this admission without SBP.  Coagulopathy.  Received doses of IV vitamin K x2.  Received 1 packet platelets 10/23.  INR slightly improved still elevated at 3.8.    Anemia.  Received 7 PRBCs thus far all between 10/22 and 10/23.  Hgb stable 9.2.  Plan EGD colonoscopy when INR </= 2.      Bloody stools, this is chronic, for months.  2018 colonoscopy w large hemorrhoids.  Subsequent hemorrhoidectomy 2018.    Thrombocytopenia.  Platelets stable, 40 K.  HE.  On lactulose, rifaximin.    Hyponatremia.   Progressive.  Sodium 131.    PLAN     Getting another 5mg  dose IV Vit K today .  If INR still elevated,  ? Give FFP to temporarily normalize INR to allow  for colonoscopy and EGD??  Pt willing to undergo studies.      Repeat CXR to re assess pleural effusion/hydrothorax, possible R thoracentesis?     Victoria Gordon  08/27/2021, 10:04 AM Phone [phone]

## 2021-08-27 NOTE — Social Work (Signed)
CSW received referral that pt had questions about medicaid. CSW spoke with pt on the phone about medicaid and gave information sheet to apply on own. CSW also Personal assistant counseling to request medicaid screening.   Victoria Reeve, LCSW Clinical Social Worker
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
## 2021-08-28 LAB — COMPREHENSIVE METABOLIC PANEL
ALT: 17 U/L (ref 0–44)
AST: 59 U/L — ABNORMAL HIGH (ref 15–41)
Albumin: 2.4 g/dL — ABNORMAL LOW (ref 3.5–5.0)
Alkaline Phosphatase: 95 U/L (ref 38–126)
Anion gap: 5 (ref 5–15)
BUN: 6 mg/dL (ref 6–20)
CO2: 20 mmol/L — ABNORMAL LOW (ref 22–32)
Calcium: 8.1 mg/dL — ABNORMAL LOW (ref 8.9–10.3)
Chloride: 107 mmol/L (ref 98–111)
Creatinine, Ser: 0.46 mg/dL (ref 0.44–1.00)
GFR, Estimated: >60 mL/min (ref >60)
Glucose, Bld: 102 mg/dL — ABNORMAL HIGH (ref 70–99)
Potassium: 4.3 mmol/L (ref 3.5–5.1)
Sodium: 132 mmol/L — ABNORMAL LOW (ref 135–145)
Total Bilirubin: 15.3 mg/dL — ABNORMAL HIGH (ref 0.3–1.2)
Total Protein: 5.3 g/dL — ABNORMAL LOW (ref 6.5–8.1)

## 2021-08-28 LAB — CBC
HCT: 29.8 % — ABNORMAL LOW (ref 36.0–46.0)
Hemoglobin: 9.5 g/dL — ABNORMAL LOW (ref 12.0–15.0)
MCH: 28.7 pg (ref 26.0–34.0)
MCHC: 31.9 g/dL (ref 30.0–36.0)
MCV: 90 fL (ref 80.0–100.0)
Platelets: 41 10*3/uL — ABNORMAL LOW (ref 150–400)
RBC: 3.31 MIL/uL — ABNORMAL LOW (ref 3.87–5.11)
RDW: 24.9 % — ABNORMAL HIGH (ref 11.5–15.5)
WBC: 6.4 10*3/uL (ref 4.0–10.5)
nRBC: 0 % (ref 0.0–0.2)

## 2021-08-28 LAB — RETICULOCYTES
Immature Retic Fract: 19.7 % — ABNORMAL HIGH (ref 2.3–15.9)
RBC.: 3.55 MIL/uL — ABNORMAL LOW (ref 3.87–5.11)
Retic Count, Absolute: 194.9 10*3/uL — ABNORMAL HIGH (ref 19.0–186.0)
Retic Ct Pct: 5.5 % — ABNORMAL HIGH (ref 0.4–3.1)

## 2021-08-28 LAB — PROTIME-INR
INR: 3.5 — ABNORMAL HIGH (ref 0.8–1.2)
Prothrombin Time: 34.8 seconds — ABNORMAL HIGH (ref 11.4–15.2)

## 2021-08-28 LAB — BILIRUBIN, DIRECT: Bilirubin, Direct: 5.6 mg/dL — ABNORMAL HIGH (ref 0.0–0.2)

## 2021-08-28 LAB — LACTATE DEHYDROGENASE: LDH: 252 U/L — ABNORMAL HIGH (ref 98–192)
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
## 2021-08-29 ENCOUNTER — Inpatient Hospital Stay (HOSPITAL_COMMUNITY): Payer: Medicaid Other | Admitting: Anesthesiology

## 2021-08-29 ENCOUNTER — Encounter (HOSPITAL_COMMUNITY)
Admission: EM | Disposition: A | Discharge status: Home / Self Care | Payer: Self-pay | Source: Home / Self Care | Attending: Family Medicine

## 2021-08-29 ENCOUNTER — Encounter (HOSPITAL_COMMUNITY): Payer: Self-pay | Admitting: Pulmonary Disease

## 2021-08-29 DIAGNOSIS — K921 Melena: Principal | ICD-10-CM

## 2021-08-29 DIAGNOSIS — K3189 Other diseases of stomach and duodenum: Secondary | ICD-10-CM

## 2021-08-29 DIAGNOSIS — K766 Portal hypertension: Secondary | ICD-10-CM

## 2021-08-29 DIAGNOSIS — I8510 Secondary esophageal varices without bleeding: Secondary | ICD-10-CM

## 2021-08-29 HISTORY — PX: ESOPHAGOGASTRODUODENOSCOPY (EGD) WITH PROPOFOL: SHX5813

## 2021-08-29 HISTORY — PX: COLONOSCOPY WITH PROPOFOL: SHX5780

## 2021-08-29 HISTORY — PX: BIOPSY: SHX5522

## 2021-08-29 LAB — COMPREHENSIVE METABOLIC PANEL
ALT: 21 U/L (ref 0–44)
AST: 69 U/L — ABNORMAL HIGH (ref 15–41)
Albumin: 2.4 g/dL — ABNORMAL LOW (ref 3.5–5.0)
Alkaline Phosphatase: 78 U/L (ref 38–126)
Anion gap: 6 (ref 5–15)
BUN: <5 mg/dL — ABNORMAL LOW (ref 6–20)
CO2: 20 mmol/L — ABNORMAL LOW (ref 22–32)
Calcium: 8.1 mg/dL — ABNORMAL LOW (ref 8.9–10.3)
Chloride: 108 mmol/L (ref 98–111)
Creatinine, Ser: 0.44 mg/dL (ref 0.44–1.00)
GFR, Estimated: >60 mL/min (ref >60)
Glucose, Bld: 87 mg/dL (ref 70–99)
Potassium: 4.2 mmol/L (ref 3.5–5.1)
Sodium: 134 mmol/L — ABNORMAL LOW (ref 135–145)
Total Bilirubin: 16 mg/dL — ABNORMAL HIGH (ref 0.3–1.2)
Total Protein: 5.6 g/dL — ABNORMAL LOW (ref 6.5–8.1)

## 2021-08-29 LAB — CBC
HCT: 28.4 % — ABNORMAL LOW (ref 36.0–46.0)
Hemoglobin: 8.9 g/dL — ABNORMAL LOW (ref 12.0–15.0)
MCH: 28.3 pg (ref 26.0–34.0)
MCHC: 31.3 g/dL (ref 30.0–36.0)
MCV: 90.4 fL (ref 80.0–100.0)
Platelets: 40 10*3/uL — ABNORMAL LOW (ref 150–400)
RBC: 3.14 MIL/uL — ABNORMAL LOW (ref 3.87–5.11)
RDW: 25.9 % — ABNORMAL HIGH (ref 11.5–15.5)
WBC: 4.9 10*3/uL (ref 4.0–10.5)
nRBC: 0 % (ref 0.0–0.2)

## 2021-08-29 LAB — PROTIME-INR
INR: 3.1 — ABNORMAL HIGH (ref 0.8–1.2)
Prothrombin Time: 31.9 seconds — ABNORMAL HIGH (ref 11.4–15.2)

## 2021-08-29 LAB — HAPTOGLOBIN: Haptoglobin: <10 mg/dL — ABNORMAL LOW (ref 33–278)

## 2021-08-29 SURGERY — ESOPHAGOGASTRODUODENOSCOPY (EGD) WITH PROPOFOL
Anesthesia: Monitor Anesthesia Care

## 2021-08-29 MED ORDER — LIDOCAINE 2% (20 MG/ML) 5 ML SYRINGE
INTRAMUSCULAR | Status: DC | PRN
  Administered 2021-08-29: 40 mg via INTRAVENOUS

## 2021-08-29 MED ORDER — PROPRANOLOL HCL 20 MG PO TABS
20.0000 mg | ORAL_TABLET | Freq: Two times a day (BID) | ORAL | Status: DC
  Administered 2021-08-29 (×2): 20 mg via ORAL
  Filled 2021-08-29 (×2): qty 1

## 2021-08-29 MED ORDER — PROPOFOL 10 MG/ML IV BOLUS
INTRAVENOUS | Status: DC | PRN
  Administered 2021-08-29 (×5): 20 mg via INTRAVENOUS

## 2021-08-29 MED ORDER — PROPOFOL 500 MG/50ML IV EMUL
INTRAVENOUS | Status: DC | PRN
  Administered 2021-08-29: 125 ug/kg/min via INTRAVENOUS

## 2021-08-29 MED ORDER — PHENYLEPHRINE 40 MCG/ML (10ML) SYRINGE FOR IV PUSH (FOR BLOOD PRESSURE SUPPORT)
PREFILLED_SYRINGE | INTRAVENOUS | Status: DC | PRN
  Administered 2021-08-29 (×3): 40 ug via INTRAVENOUS

## 2021-08-29 MED ORDER — LACTATED RINGERS IV SOLN: INTRAVENOUS | Status: DC | PRN

## 2021-08-29 MED ORDER — PANTOPRAZOLE SODIUM 40 MG PO TBEC
40.0000 mg | DELAYED_RELEASE_TABLET | Freq: Every day | ORAL | Status: DC
  Administered 2021-08-29 – 2021-08-30 (×2): 40 mg via ORAL
  Filled 2021-08-29 (×2): qty 1

## 2021-08-29 NOTE — Op Note (Signed)
[facility] Patient Name: Victoria Gordon Procedure Date : 08/29/2021 MRN: [id number] Attending MD: Gladstone Pih. Candis Schatz , MD Date of Birth: 11-13-1980 CSN: [id number] Age: 40 Admit Type: Inpatient Procedure:                Colonoscopy Indications:              Hematochezia, Iron deficiency anemia secondary to                            chronic blood loss Providers:                Nicki Reaper E. Candis Schatz, MD, Doristine Johns, RN,                            Jaci Carrel, RN, [facility] Technician,                            Technician Referring MD:              Medicines:                Monitored Anesthesia Care Complications:            No immediate complications. Estimated Blood Loss:     Estimated blood loss: none. Procedure:                Pre-Anesthesia Assessment:                           - Prior to the procedure, a History and Physical                            was performed, and patient medications and                            allergies were reviewed. The patient's tolerance of                            previous anesthesia was also reviewed. The risks                            and benefits of the procedure and the sedation                            options and risks were discussed with the patient.                            All questions were answered, and informed consent                            was obtained. Prior Anticoagulants: The patient has                            taken no previous anticoagulant or antiplatelet                            agents. ASA Grade Assessment:  III - A patient with                            severe systemic disease. After reviewing the risks                            and benefits, the patient was deemed in                            satisfactory condition to undergo the procedure.                           After obtaining informed consent, the colonoscope                            was passed under direct vision.  Throughout the                            procedure, the patient's blood pressure, pulse, and                            oxygen saturations were monitored continuously. The                            CF-HQ190L ([id number]) Olympus coloscope was                            introduced through the anus and advanced to the the                            cecum, identified by the ileocecal valve. The                            colonoscopy was performed without difficulty. The                            patient tolerated the procedure well. The quality                            of the bowel preparation was fair. The ileocecal                            valve was photographed. Scope In: 7:53:45 AM Scope Out: 8:04:04 AM Scope Withdrawal Time: 0 hours 6 minutes 5 seconds  Total Procedure Duration: 0 hours 10 minutes 19 seconds  Findings:      Hemorrhoids were found on perianal exam.      A diffuse area of mildly friable mucosa with contact bleeding was found       in the entire colon.      The exam was otherwise normal throughout the examined colon.      Non-bleeding internal hemorrhoids and scarrring from previous       hemorrhoidectomy were found during retroflexion (picture did not       capture, apparently).      No additional abnormalities were found on  retroflexion. Impression:               - Preparation of the colon was fair. The cecum was                            only partially visualized due to extensive solid                            stool that could not be suctioned/lavaged.                            Appendiceal orifice was not definitively seen.                           - Hemorrhoids found on perianal exam.                           - Friability with contact bleeding in the entire                            examined colon. Attributable to thrombocytopenia                            and coagulopathy                           - Non-bleeding internal hemorrhoids.                            - No specimens collected. Recommendation:           - Return patient to hospital ward for ongoing care.                           - Resume previous diet.                           - Continue present medications.                           - Repeat colonoscopy in 7 years for colon cancer                            screening.                           - Recommend daily metamucil to reduce hemorrhoidal                            bleeding Procedure Code(s):        --- Professional ---                           [phone], Colonoscopy, flexible; diagnostic, including                            collection of specimen(s) by brushing or washing,  when performed (separate procedure) Diagnosis Code(s):        --- Professional ---                           K64.8, Other hemorrhoids                           K92.2, Gastrointestinal hemorrhage, unspecified                           K92.1, Melena (includes Hematochezia)                           D50.0, Iron deficiency anemia secondary to blood                            loss (chronic) CPT copyright 2019 [employer]. All rights reserved. The codes documented in this report are preliminary and upon coder review may  be revised to meet current compliance requirements. [name] E. Candis Schatz, MD 08/29/2021 8:31:45 AM This report has been signed electronically. Number of Addenda: 0

## 2021-08-29 NOTE — Anesthesia Postprocedure Evaluation (Signed)
Anesthesia Post Note  Patient: Victoria Gordon  Procedure(s) Performed: ESOPHAGOGASTRODUODENOSCOPY (EGD) WITH PROPOFOL COLONOSCOPY WITH PROPOFOL BIOPSY     Patient location during evaluation: Endoscopy Anesthesia Type: MAC Level of consciousness: awake and alert Pain management: pain level controlled Vital Signs Assessment: post-procedure vital signs reviewed and stable Respiratory status: spontaneous breathing, nonlabored ventilation and respiratory function stable Cardiovascular status: stable and blood pressure returned to baseline Postop Assessment: no apparent nausea or vomiting Anesthetic complications: no   No notable events documented.  Last Vitals:  Vitals:   08/29/21 1204 08/29/21 1606  BP: 100/61 (!) 99/56  Pulse: 61 62  Resp: 18 18  Temp: 36.5 C 36.7 C  SpO2: 100% 95%    Last Pain:  Vitals:   08/29/21 1606  TempSrc: Oral  PainSc:                  Catalina Gravel

## 2021-08-29 NOTE — Transfer of Care (Signed)
Immediate Anesthesia Transfer of Care Note  Patient: Victoria Gordon  Procedure(s) Performed: ESOPHAGOGASTRODUODENOSCOPY (EGD) WITH PROPOFOL COLONOSCOPY WITH PROPOFOL BIOPSY  Patient Location: Endoscopy Unit  Anesthesia Type:MAC  Level of Consciousness: sedated  Airway & Oxygen Therapy: Patient Spontanous Breathing  Post-op Assessment: Report given to RN and Post -op Vital signs reviewed and stable  Post vital signs: Reviewed and stable  Last Vitals:  Vitals Value Taken Time  BP 108/44 08/29/21 0808  Temp    Pulse 78 08/29/21 0810  Resp 18 08/29/21 0810  SpO2 98 % 08/29/21 0810  Vitals shown include unvalidated device data.  Last Pain:  Vitals:   08/29/21 0713  TempSrc: Temporal  PainSc: 4          Complications: No notable events documented.

## 2021-08-29 NOTE — Interval H&P Note (Signed)
History and Physical Interval Note:  No acute events overnight, INR did come down some more, but still 3.  Hgb stable.  Plan for diagnostic EGD/colonoscopy.  Consider therapeutic options (banding, APC) in future if INR improves (<2.5)  08/29/2021 8:08 AM  Victoria Gordon  has presented today for surgery, with the diagnosis of evaluate bloody stools and screen stomach for liver disease related changes.  anemia..  The various methods of treatment have been discussed with the patient and family. After consideration of risks, benefits and other options for treatment, the patient has consented to  Procedure(s): ESOPHAGOGASTRODUODENOSCOPY (EGD) WITH PROPOFOL (N/A) COLONOSCOPY WITH PROPOFOL (N/A) BIOPSY as a surgical intervention.  The patient's history has been reviewed, patient examined, no change in status, stable for surgery.  I have reviewed the patient's chart and labs.  Questions were answered to the patient's satisfaction.     Daryel November
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
## 2021-08-29 NOTE — Op Note (Signed)
[facility] Patient Name: Victoria Gordon Procedure Date : 08/29/2021 MRN: [id number] Attending MD: Gladstone Pih. Candis Schatz , MD Date of Birth: 1980-12-27 CSN: [id number] Age: 40 Admit Type: Inpatient Procedure:                Upper GI endoscopy Indications:              Iron deficiency anemia secondary to chronic blood                            loss, Cirrhosis with suspected esophageal varices Providers:                Nicki Reaper E. Candis Schatz, MD, Doristine Johns, RN,                            Jaci Carrel, RN, [facility] Technician,                            Technician Referring MD:              Medicines:                Monitored Anesthesia Care Complications:            No immediate complications. Estimated Blood Loss:     Estimated blood loss was minimal. Procedure:                Pre-Anesthesia Assessment:                           - Prior to the procedure, a History and Physical                            was performed, and patient medications and                            allergies were reviewed. The patient's tolerance of                            previous anesthesia was also reviewed. The risks                            and benefits of the procedure and the sedation                            options and risks were discussed with the patient.                            All questions were answered, and informed consent                            was obtained. Prior Anticoagulants: The patient has                            taken no previous anticoagulant or antiplatelet  agents. ASA Grade Assessment: III - A patient with                            severe systemic disease. After reviewing the risks                            and benefits, the patient was deemed in                            satisfactory condition to undergo the procedure.                           After obtaining informed consent, the endoscope was                             passed under direct vision. Throughout the                            procedure, the patient's blood pressure, pulse, and                            oxygen saturations were monitored continuously. The                            [id number]-[id number] ([id number]) Olympus endoscope was introduced                            through the mouth, and advanced to the second part                            of duodenum. The upper GI endoscopy was                            accomplished without difficulty. The patient                            tolerated the procedure well. Scope In: Scope Out: Findings:      The examined portions of the nasopharynx, oropharynx and larynx were       normal.      Large (> 5 mm) varices were found in the middle third of the esophagus       and in the lower third of the esophagus. No red wale signs or bleeding       stigmata      The exam of the esophagus was otherwise normal.      Moderate portal hypertensive gastropathy with friability (contact       oozing) was found in the gastric fundus and in the gastric body.       Biopsies were taken with a cold forceps for Helicobacter pylori testing.       Estimated blood loss was minimal.      A few localized small erosions with no stigmata of recent bleeding were       found in the gastric antrum.      The examined duodenum was normal. Impression:               -  The examined portions of the nasopharynx,                            oropharynx and larynx were normal other than                            icterus.                           - Large (> 5 mm) esophageal varices. No bleeding                            stigmata. Patient did not have hematemesis or                            profuse melena to suggest variceal bleeding.                            Varices not banded today for prophylaxis due to                            persistent coagulopathy (INR 3 from 5 despite over                            25 mg IV vit K                            - Portal hypertensive gastropathy with friability.                            Biopsied.                           - Erosive gastropathy with no stigmata of recent                            bleeding. I suspect this was a more likely source                            of chronic bleeding to get her hgb to 1.7                           - Normal examined duodenum. Recommendation:           - Return patient to hospital ward for ongoing care.                           - Resume previous diet.                           - Continue present medications.                           - Await pathology results.                           -  Start propranolol 20 mg PO BID for variceal                            prophylaxis.                           - Recommend outpatient follow up with primary                            gastroenterologist to reconsider prophylactic                            banding if INR improves with time                           - Decrease Protonix to once daily. Procedure Code(s):        --- Professional ---                           [phone], Esophagogastroduodenoscopy, flexible,                            transoral; with biopsy, single or multiple Diagnosis Code(s):        --- Professional ---                           K74.60, Unspecified cirrhosis of liver                           I85.10, Secondary esophageal varices without                            bleeding                           K76.6, Portal hypertension                           K31.89, Other diseases of stomach and duodenum                           D50.0, Iron deficiency anemia secondary to blood                            loss (chronic) CPT copyright 2019 American Medical Association. All rights reserved. The codes documented in this report are preliminary and upon coder review may  be revised to meet current compliance requirements. Cainan Trull E. Candis Schatz, MD 08/29/2021 8:22:03 AM This report has  been signed electronically. Number of Addenda: 0

## 2021-08-29 NOTE — Anesthesia Preprocedure Evaluation (Addendum)
Anesthesia Evaluation  Patient identified by MRN, date of birth, ID band Patient awake    Reviewed: Allergy & Precautions, NPO status , Patient's Chart, lab work & pertinent test results  Airway Mallampati: III  TM Distance: >3 FB Neck ROM: Full    Dental  (+) Teeth Intact, Dental Advisory Given   Pulmonary neg pulmonary ROS,    Pulmonary exam normal breath sounds clear to auscultation       Cardiovascular negative cardio ROS Normal cardiovascular exam Rhythm:Regular Rate:Normal     Neuro/Psych PSYCHIATRIC DISORDERS Anxiety Depression negative neurological ROS     GI/Hepatic hiatal hernia, (+)     substance abuse  alcohol use, Hepatitis -, B  Endo/Other  Obesity   Renal/GU negative Renal ROS     Musculoskeletal negative musculoskeletal ROS (+)   Abdominal   Peds  Hematology  (+) Blood dyscrasia (Thrombocytopenia), anemia ,   Anesthesia Other Findings Day of surgery medications reviewed with the patient.  Reproductive/Obstetrics                            Anesthesia Physical Anesthesia Plan  ASA: 3  Anesthesia Plan: MAC   Post-op Pain Management:    Induction: Intravenous  PONV Risk Score and Plan: 2 and Propofol infusion and Treatment may vary due to age or medical condition  Airway Management Planned: Nasal Cannula and Natural Airway  Additional Equipment:   Intra-op Plan:   Post-operative Plan:   Informed Consent: I have reviewed the patients History and Physical, chart, labs and discussed the procedure including the risks, benefits and alternatives for the proposed anesthesia with the patient or authorized representative who has indicated his/her understanding and acceptance.     Dental advisory given  Plan Discussed with: CRNA and Anesthesiologist  Anesthesia Plan Comments:         Anesthesia Quick Evaluation

## 2021-08-30 ENCOUNTER — Encounter (HOSPITAL_COMMUNITY): Payer: Self-pay | Admitting: Gastroenterology

## 2021-08-30 LAB — PROTIME-INR
INR: 3.3 — ABNORMAL HIGH (ref 0.8–1.2)
Prothrombin Time: 33.3 seconds — ABNORMAL HIGH (ref 11.4–15.2)

## 2021-08-30 MED ORDER — FOLIC ACID 1 MG PO TABS: 1.0000 mg | ORAL_TABLET | Freq: Every day | ORAL | 12 refills | Status: DC

## 2021-08-30 MED ORDER — FUROSEMIDE 40 MG PO TABS: 40.0000 mg | ORAL_TABLET | Freq: Two times a day (BID) | ORAL | 2 refills | Status: DC

## 2021-08-30 MED ORDER — LACTULOSE 10 GM/15ML PO SOLN
20.0000 g | Freq: Two times a day (BID) | ORAL | 0 refills | Status: DC
Start: 2021-08-30 — End: 2021-09-27

## 2021-08-30 MED ORDER — RIFAXIMIN 550 MG PO TABS: 550.0000 mg | ORAL_TABLET | Freq: Two times a day (BID) | ORAL | 3 refills | Status: DC

## 2021-08-30 MED ORDER — CIPROFLOXACIN HCL 500 MG PO TABS
500.0000 mg | ORAL_TABLET | Freq: Every day | ORAL | 1 refills | Status: DC
Start: 2021-08-30 — End: 2021-09-27

## 2021-08-30 MED ORDER — PANTOPRAZOLE SODIUM 40 MG PO TBEC: 40.0000 mg | DELAYED_RELEASE_TABLET | Freq: Every day | ORAL | 3 refills | Status: DC

## 2021-08-30 MED ORDER — PROPRANOLOL HCL 20 MG PO TABS
10.0000 mg | ORAL_TABLET | Freq: Two times a day (BID) | ORAL | Status: DC
  Administered 2021-08-30: 10 mg via ORAL
  Filled 2021-08-30: qty 1

## 2021-08-30 MED ORDER — SPIRONOLACTONE 100 MG PO TABS: 100.0000 mg | ORAL_TABLET | Freq: Two times a day (BID) | ORAL | 2 refills | Status: DC

## 2021-08-30 NOTE — Progress Notes (Signed)
Mobility Specialist Progress Note:   08/30/21 1100  Mobility  Activity Ambulated in hall  Level of Assistance Independent  Assistive Device None  Distance Ambulated (ft) 320 ft  Mobility Ambulated independently in hallway  Mobility Response Tolerated well  Mobility performed by Mobility specialist  $Mobility charge 1 Mobility   Pt displayed SOB during ambulation. Otherwise asx, and eager for d/c.  Nelta Numbers Mobility Specialist  Phone [phone]

## 2021-08-30 NOTE — Discharge Summary (Signed)
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 with  improper specimen collection/handling, submission of specimen other than nasopharyngeal swab, presence of viral mutation(s) within the areas targeted by this assay, and inadequate number of viral copies(<138 copies/mL). A negative result must be combined with clinical observations, patient history, and epidemiological information. The expected result is Negative.  Fact Sheet for Patients:  EntrepreneurPulse.com.au  Fact Sheet for Healthcare Providers:  IncredibleEmployment.be  This test is no t yet approved or cleared by the [place] FDA and  has been authorized for detection and/or diagnosis of SARS-CoV-2 by FDA under an Emergency Use Authorization (EUA). This EUA will remain  in effect (meaning this test can be used) for the duration of the COVID-19 declaration under Section 564(b)(1) of the Act,  21 U.S.C.section 360bbb-3(b)(1), unless the authorization is terminated  or revoked sooner.       Influenza A by PCR NEGATIVE NEGATIVE Final   Influenza B by PCR NEGATIVE NEGATIVE Final    Comment: (NOTE) The Xpert Xpress SARS-CoV-2/FLU/RSV plus assay is intended as an aid in the diagnosis of influenza  from Nasopharyngeal swab specimens and should not be used as a sole basis for treatment. Nasal washings and aspirates are unacceptable for Xpert Xpress SARS-CoV-2/FLU/RSV testing.  Fact Sheet for Patients: EntrepreneurPulse.com.au  Fact Sheet for Healthcare Providers: IncredibleEmployment.be  This test is not yet approved or cleared by the [place] FDA and has been authorized for detection and/or diagnosis of SARS-CoV-2 by FDA under an Emergency Use Authorization (EUA). This EUA will remain in effect (meaning this test can be used) for the duration of the COVID-19 declaration under Section 564(b)(1) of the Act, 21 U.S.C. section 360bbb-3(b)(1), unless the authorization is terminated or revoked.  Performed at [facility], [address], [place], [place] [address]   Blood culture (routine x 2)     Status: None   Collection Time: 08/20/21  7:45 PM   Specimen: BLOOD RIGHT ARM  Result Value Ref Range Status   Specimen Description BLOOD RIGHT ARM  Final   Special Requests   Final    Blood Culture adequate volume BOTTLES DRAWN AEROBIC AND ANAEROBIC   Culture   Final    NO GROWTH 5 DAYS Performed at [facility], [address] [place], [place] [address]    Report Status 08/25/2021 FINAL  Final  MRSA Next Gen by PCR, Nasal     Status: None   Collection Time: 08/21/21  1:41 AM   Specimen: Nasal Mucosa; Nasal Swab  Result Value Ref Range Status   MRSA by PCR Next Gen NOT DETECTED NOT DETECTED Final    Comment: (NOTE) The GeneXpert MRSA Assay (FDA approved for NASAL specimens only), is one component of a comprehensive MRSA colonization  surveillance program. It is not intended to diagnose MRSA infection nor to guide or monitor treatment for MRSA infections. Test performance is not FDA approved in patients less than 68 years old. Performed at [facility], [place] [address] [place], [place] [address]   Body fluid culture w Gram Stain     Status: None   Collection Time: 08/21/21  4:13 PM   Specimen: Peritoneal Washings; Peritoneal Fluid  Result Value Ref Range Status   Specimen Description PERITONEAL FLUID  Final   Special Requests NONE  Final   Gram Stain   Final    NO SQUAMOUS EPITHELIAL CELLS SEEN FEW WBC SEEN NO ORGANISMS SEEN    Culture   Final    NO GROWTH 3 DAYS Performed at [facility], [address], [place], [place] [address]    Report Status 08/25/2021 FINAL  Final     Labs: Basic Metabolic Panel: Recent Labs  Lab 08/24/21 0307 08/25/21 0206 08/26/21 0116 08/27/21 0211 08/28/21 0038 08/29/21 0851  NA 134* 133* 132* 131* 132* 134*  K 3.4* 3.0* 3.5 4.0 4.3 4.2  CL 106 102 104 104 107 108  CO2 23 26 23 23  20* 20*  GLUCOSE 114* 112* 95 107* 102* 87  BUN 5* <5* 5* 5* 6 <5*  CREATININE 0.47 0.50 0.50 0.32* 0.46 0.44  CALCIUM 7.3* 7.2* 7.2* 7.7* 8.1* 8.1*  MG 1.5*  --   --   --   --   --   PHOS 2.3*  --   --   --   --   --    Liver Function Tests: Recent Labs  Lab 08/25/21 0206 08/26/21 0116 08/27/21 0211 08/28/21 0038 08/29/21 0851  AST 50* 49* 52* 59* 69*  ALT 16 14 16 17 21   ALKPHOS 82 86 92 95 78  BILITOT 14.4* 13.2* 14.3* 15.3* 16.0*  PROT 4.9* 4.5* 5.0* 5.3* 5.6*  ALBUMIN 2.5* 2.1* 2.3* 2.4* 2.4*   No results for input(s): LIPASE, AMYLASE in the last 168 hours. No results for input(s): AMMONIA in the last 168 hours. CBC: Recent Labs  Lab 08/25/21 0206 08/26/21 0607 08/27/21 0211 08/28/21 0038 08/29/21 0851  WBC 4.0 4.3 5.0 6.4 4.9  HGB 9.2* 8.9* 9.2* 9.5* 8.9*  HCT 28.8* 28.0* 29.7* 29.8* 28.4*  MCV 88.1 88.6 91.4 90.0 90.4  PLT 42* 39* 40* 41* 40*    Cardiac Enzymes: No results for input(s): CKTOTAL, CKMB, CKMBINDEX, TROPONINI in the last 168 hours. BNP: BNP (last 3 results) No results for input(s): BNP in the last 8760 hours.  ProBNP (last 3 results) No results for input(s): PROBNP in the last 8760 hours.  CBG: Recent Labs  Lab 08/23/21 1118 08/23/21 1640  GLUCAP 115* 99       Signed:  Nita Sells MD   [facility] 08/30/2021, 8:45 AM

## 2021-08-30 NOTE — TOC Transition Note (Addendum)
Transition of Care [facility]) - CM/SW Discharge Note   Patient Details  Name: Victoria Gordon MRN: [id number] Date of Birth: Mar 29, 1981  [facility] CM/SW Contact:  Bartholomew Crews, RN Phone Number: [phone] 08/30/2021, 10:29 AM   Clinical Narrative:     Spoke with patient at the bedside to discuss plans for transition home. PTA home with mom. Has transportation home.   No PCP. Provided information on AVS for [facility] and advised patient to call and schedule an appointment. Patient verbalized understanding.   Discussed discharge medications. Rifaximin is over $2000 which patient confirmed is not affordable. Due to high cost there is no medication assistance available through [facility] for this medication. Patient advised to follow up with GI concerning this medication. Discussed importance of taking lactolose and expected side effects. [place] letter provided for other medications. Patient advised that vitamins are not covered under [facility] program. Patient agreeable to shared copay cost.   Follow up email sent to financial counselor with verified cell phone number.   No further TOC needs identified at this time.   Final next level of care: Home/Self Care Barriers to Discharge: No Barriers Identified   Patient Goals and CMS Choice Patient states their goals for this hospitalization and ongoing recovery are:: return home with mom CMS Medicare.gov Compare Post Acute Care list provided to:: Patient Choice offered to / list presented to : NA  Discharge Placement                       Discharge Plan and Services                DME Arranged: N/A DME Agency: NA         HH Agency: NA        Social Determinants of Health (SDOH) Interventions     Readmission Risk Interventions No flowsheet data found.

## 2021-08-31 LAB — TYPE AND SCREEN
ABO/RH(D): O POS
Antibody Screen: POSITIVE
DAT, IgG: NEGATIVE
DAT, complement: NEGATIVE

## 2021-08-31 LAB — SURGICAL PATHOLOGY

## 2021-09-03 ENCOUNTER — Telehealth: Payer: Self-pay

## 2021-09-03 NOTE — Telephone Encounter (Signed)
Left VM for patient to call me back to schedule Follow up appointment.

## 2021-09-06 NOTE — Progress Notes (Signed)
Victoria Gordon,  The biopsies taken from your stomach were notable for mild chronic gastritis (inflammation) which is a common finding, but there was no evidence of Helicobacter pylori infection. Please follow up with me in the clinic for ongoing management of your cirrhosis.

## 2021-09-27 ENCOUNTER — Other Ambulatory Visit (INDEPENDENT_AMBULATORY_CARE_PROVIDER_SITE_OTHER): Payer: Medicaid Other

## 2021-09-27 ENCOUNTER — Ambulatory Visit (INDEPENDENT_AMBULATORY_CARE_PROVIDER_SITE_OTHER): Payer: Medicaid Other | Admitting: Gastroenterology

## 2021-09-27 ENCOUNTER — Encounter: Payer: Self-pay | Admitting: Gastroenterology

## 2021-09-27 VITALS — BP 100/60 | HR 85 | Ht 61.0 in | Wt 172.0 lb

## 2021-09-27 DIAGNOSIS — I8510 Secondary esophageal varices without bleeding: Secondary | ICD-10-CM

## 2021-09-27 DIAGNOSIS — D500 Iron deficiency anemia secondary to blood loss (chronic): Secondary | ICD-10-CM

## 2021-09-27 DIAGNOSIS — K7682 Hepatic encephalopathy: Secondary | ICD-10-CM

## 2021-09-27 DIAGNOSIS — Z8619 Personal history of other infectious and parasitic diseases: Secondary | ICD-10-CM | POA: Diagnosis not present

## 2021-09-27 DIAGNOSIS — K7031 Alcoholic cirrhosis of liver with ascites: Secondary | ICD-10-CM | POA: Diagnosis not present

## 2021-09-27 DIAGNOSIS — K640 First degree hemorrhoids: Secondary | ICD-10-CM

## 2021-09-27 LAB — BASIC METABOLIC PANEL
BUN: 6 mg/dL (ref 6–23)
CO2: 22 mEq/L (ref 19–32)
Calcium: 7.5 mg/dL — ABNORMAL LOW (ref 8.4–10.5)
Chloride: 103 mEq/L (ref 96–112)
Creatinine, Ser: 0.57 mg/dL (ref 0.40–1.20)
GFR: 115.3 mL/min (ref >60.00)
Glucose, Bld: 90 mg/dL (ref 70–99)
Potassium: 3.8 mEq/L (ref 3.5–5.1)
Sodium: 131 mEq/L — ABNORMAL LOW (ref 135–145)

## 2021-09-27 LAB — CBC WITH DIFFERENTIAL/PLATELET
Basophils Absolute: 0 10*3/uL (ref 0.0–0.1)
Basophils Relative: 0.8 % (ref 0.0–3.0)
Eosinophils Absolute: 0.1 10*3/uL (ref 0.0–0.7)
Eosinophils Relative: 2.6 % (ref 0.0–5.0)
HCT: 28.2 % — ABNORMAL LOW (ref 36.0–46.0)
Hemoglobin: 9.7 g/dL — ABNORMAL LOW (ref 12.0–15.0)
Lymphocytes Relative: 20.7 % (ref 12.0–46.0)
Lymphs Abs: 1 10*3/uL (ref 0.7–4.0)
MCHC: 34.2 g/dL (ref 30.0–36.0)
MCV: 98.1 fl (ref 78.0–100.0)
Monocytes Absolute: 0.6 10*3/uL (ref 0.1–1.0)
Monocytes Relative: 12.4 % — ABNORMAL HIGH (ref 3.0–12.0)
Neutro Abs: 2.9 10*3/uL (ref 1.4–7.7)
Neutrophils Relative %: 63.5 % (ref 43.0–77.0)
Platelets: 47 10*3/uL — CL (ref 150.0–400.0)
RBC: 2.88 Mil/uL — ABNORMAL LOW (ref 3.87–5.11)
RDW: 22.6 % — ABNORMAL HIGH (ref 11.5–15.5)
WBC: 4.6 10*3/uL (ref 4.0–10.5)

## 2021-09-27 LAB — HEPATIC FUNCTION PANEL
ALT: 17 U/L (ref 0–35)
AST: 41 U/L — ABNORMAL HIGH (ref 0–37)
Albumin: 2.6 g/dL — ABNORMAL LOW (ref 3.5–5.2)
Alkaline Phosphatase: 220 U/L — ABNORMAL HIGH (ref 39–117)
Bilirubin, Direct: 4.5 mg/dL — ABNORMAL HIGH (ref 0.0–0.3)
Total Bilirubin: 9.9 mg/dL — ABNORMAL HIGH (ref 0.2–1.2)
Total Protein: 5.7 g/dL — ABNORMAL LOW (ref 6.0–8.3)

## 2021-09-27 LAB — PROTIME-INR
INR: 3.2 ratio — ABNORMAL HIGH (ref 0.8–1.0)
Prothrombin Time: 32.8 s — ABNORMAL HIGH (ref 9.6–13.1)

## 2021-09-27 MED ORDER — LACTULOSE 10 GM/15ML PO SOLN: 20.0000 g | Freq: Two times a day (BID) | ORAL | 0 refills | Status: DC

## 2021-09-27 MED ORDER — PROPRANOLOL HCL 20 MG PO TABS: 20.0000 mg | ORAL_TABLET | Freq: Three times a day (TID) | ORAL | 1 refills | Status: DC

## 2021-09-27 MED ORDER — CIPROFLOXACIN HCL 500 MG PO TABS: 500.0000 mg | ORAL_TABLET | Freq: Every day | ORAL | 3 refills | Status: DC

## 2021-09-27 MED ORDER — RIFAXIMIN 550 MG PO TABS: 550.0000 mg | ORAL_TABLET | Freq: Two times a day (BID) | ORAL | 3 refills | Status: DC

## 2021-09-27 NOTE — Patient Instructions (Addendum)
If you are age 38 or older, your body mass index should be between 23-30. Your Body mass index is 32.5 kg/m. If this is out of the aforementioned range listed, please consider follow up with your Primary Care Provider.  If you are age 66 or younger, your body mass index should be between 19-25. Your Body mass index is 32.5 kg/m. If this is out of the aformentioned range listed, please consider follow up with your Primary Care Provider.   Your provider has requested that you go to the basement level for lab work before leaving today. Press "B" on the elevator. The lab is located at the first door on the left as you exit the elevator.  We have sent the following medications to your pharmacy for you to pick up at your convenience:  Lactulose 10 gm/15 ml daily. Ciprofloxacin 500 mg daily. Propranolol 20 mg three times daily. Rifaxin 550 mg twice daily.    The [place] [facility] providers would like to encourage you to use [facility] to communicate with providers for non-urgent requests or questions.  Due to long hold times on the telephone, sending your provider a message by [facility] may be a faster and more efficient way to get a response.  Please allow 48 business hours for a response.  Please remember that this is for non-urgent requests.   It was a pleasure to see you today!  Thank you for trusting me with your gastrointestinal care!    Scott E.Candis Schatz, MD
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
## 2021-10-04 ENCOUNTER — Telehealth: Payer: Self-pay
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
## 2021-10-04 NOTE — Telephone Encounter (Signed)
Called patient to inform her on lab results and got no answer. I was not able to leave VM because of busy signal.

## 2021-10-06 ENCOUNTER — Telehealth: Payer: Self-pay | Admitting: Gastroenterology

## 2021-10-06 NOTE — Progress Notes (Signed)
Spoke with patient and informed her of labs and that a follow up appointment has been made for February.

## 2021-10-06 NOTE — Telephone Encounter (Signed)
Patient returned you call, can be reached at [phone]

## 2021-11-01 ENCOUNTER — Encounter (HOSPITAL_COMMUNITY): Payer: Self-pay | Admitting: *Deleted

## 2021-11-01 ENCOUNTER — Other Ambulatory Visit: Payer: Self-pay

## 2021-11-01 ENCOUNTER — Inpatient Hospital Stay (HOSPITAL_COMMUNITY)
Admission: EM | Admit: 2021-11-01 | Discharge: 2021-11-03 | DRG: 433 | Disposition: A | Discharge status: Home / Self Care | Payer: Medicaid Other | Attending: Internal Medicine | Admitting: Internal Medicine

## 2021-11-01 ENCOUNTER — Inpatient Hospital Stay (HOSPITAL_COMMUNITY): Payer: Medicaid Other

## 2021-11-01 DIAGNOSIS — K769 Liver disease, unspecified: Secondary | ICD-10-CM

## 2021-11-01 DIAGNOSIS — F419 Anxiety disorder, unspecified: Secondary | ICD-10-CM | POA: Diagnosis present

## 2021-11-01 DIAGNOSIS — Z20822 Contact with and (suspected) exposure to covid-19: Secondary | ICD-10-CM | POA: Diagnosis present

## 2021-11-01 DIAGNOSIS — E8809 Other disorders of plasma-protein metabolism, not elsewhere classified: Secondary | ICD-10-CM | POA: Diagnosis present

## 2021-11-01 DIAGNOSIS — D61818 Other pancytopenia: Secondary | ICD-10-CM | POA: Diagnosis present

## 2021-11-01 DIAGNOSIS — Z91048 Other nonmedicinal substance allergy status: Secondary | ICD-10-CM | POA: Diagnosis not present

## 2021-11-01 DIAGNOSIS — D649 Anemia, unspecified: Secondary | ICD-10-CM | POA: Diagnosis not present

## 2021-11-01 DIAGNOSIS — F4310 Post-traumatic stress disorder, unspecified: Secondary | ICD-10-CM | POA: Diagnosis present

## 2021-11-01 DIAGNOSIS — I959 Hypotension, unspecified: Secondary | ICD-10-CM | POA: Diagnosis present

## 2021-11-01 DIAGNOSIS — Z881 Allergy status to other antibiotic agents status: Secondary | ICD-10-CM | POA: Diagnosis not present

## 2021-11-01 DIAGNOSIS — K7031 Alcoholic cirrhosis of liver with ascites: Secondary | ICD-10-CM | POA: Diagnosis present

## 2021-11-01 DIAGNOSIS — R1030 Lower abdominal pain, unspecified: Secondary | ICD-10-CM

## 2021-11-01 DIAGNOSIS — K766 Portal hypertension: Secondary | ICD-10-CM | POA: Diagnosis present

## 2021-11-01 DIAGNOSIS — D684 Acquired coagulation factor deficiency: Secondary | ICD-10-CM | POA: Diagnosis present

## 2021-11-01 DIAGNOSIS — K625 Hemorrhage of anus and rectum: Secondary | ICD-10-CM | POA: Diagnosis present

## 2021-11-01 DIAGNOSIS — D696 Thrombocytopenia, unspecified: Secondary | ICD-10-CM | POA: Diagnosis present

## 2021-11-01 DIAGNOSIS — K921 Melena: Secondary | ICD-10-CM | POA: Diagnosis present

## 2021-11-01 DIAGNOSIS — F329 Major depressive disorder, single episode, unspecified: Secondary | ICD-10-CM | POA: Diagnosis present

## 2021-11-01 DIAGNOSIS — F1010 Alcohol abuse, uncomplicated: Secondary | ICD-10-CM | POA: Diagnosis present

## 2021-11-01 DIAGNOSIS — R188 Other ascites: Secondary | ICD-10-CM

## 2021-11-01 DIAGNOSIS — E669 Obesity, unspecified: Secondary | ICD-10-CM | POA: Diagnosis present

## 2021-11-01 DIAGNOSIS — J918 Pleural effusion in other conditions classified elsewhere: Secondary | ICD-10-CM | POA: Diagnosis not present

## 2021-11-01 DIAGNOSIS — E871 Hypo-osmolality and hyponatremia: Secondary | ICD-10-CM | POA: Diagnosis present

## 2021-11-01 DIAGNOSIS — Z9889 Other specified postprocedural states: Secondary | ICD-10-CM

## 2021-11-01 DIAGNOSIS — K7030 Alcoholic cirrhosis of liver without ascites: Secondary | ICD-10-CM | POA: Diagnosis present

## 2021-11-01 DIAGNOSIS — D62 Acute posthemorrhagic anemia: Secondary | ICD-10-CM | POA: Diagnosis present

## 2021-11-01 DIAGNOSIS — K644 Residual hemorrhoidal skin tags: Secondary | ICD-10-CM | POA: Diagnosis present

## 2021-11-01 DIAGNOSIS — J90 Pleural effusion, not elsewhere classified: Secondary | ICD-10-CM | POA: Diagnosis present

## 2021-11-01 DIAGNOSIS — Z79899 Other long term (current) drug therapy: Secondary | ICD-10-CM

## 2021-11-01 DIAGNOSIS — Z8619 Personal history of other infectious and parasitic diseases: Secondary | ICD-10-CM

## 2021-11-01 DIAGNOSIS — R0689 Other abnormalities of breathing: Secondary | ICD-10-CM

## 2021-11-01 DIAGNOSIS — Z6836 Body mass index (BMI) 36.0-36.9, adult: Secondary | ICD-10-CM

## 2021-11-01 LAB — URINALYSIS, ROUTINE W REFLEX MICROSCOPIC
Glucose, UA: 50 mg/dL — AB
Hgb urine dipstick: NEGATIVE
Ketones, ur: NEGATIVE mg/dL
Nitrite: NEGATIVE
Protein, ur: 30 mg/dL — AB
Specific Gravity, Urine: 1.024 (ref 1.005–1.030)
WBC, UA: >50 WBC/hpf — ABNORMAL HIGH (ref 0–5)
pH: 5 (ref 5.0–8.0)

## 2021-11-01 LAB — PROTIME-INR
INR: 2.9 — ABNORMAL HIGH (ref 0.8–1.2)
Prothrombin Time: 30.4 seconds — ABNORMAL HIGH (ref 11.4–15.2)

## 2021-11-01 LAB — COMPREHENSIVE METABOLIC PANEL
ALT: 23 U/L (ref 0–44)
AST: 51 U/L — ABNORMAL HIGH (ref 15–41)
Albumin: 2 g/dL — ABNORMAL LOW (ref 3.5–5.0)
Alkaline Phosphatase: 123 U/L (ref 38–126)
Anion gap: 6 (ref 5–15)
BUN: 13 mg/dL (ref 6–20)
CO2: 20 mmol/L — ABNORMAL LOW (ref 22–32)
Calcium: 7.5 mg/dL — ABNORMAL LOW (ref 8.9–10.3)
Chloride: 106 mmol/L (ref 98–111)
Creatinine, Ser: 0.81 mg/dL (ref 0.44–1.00)
GFR, Estimated: >60 mL/min (ref >60)
Glucose, Bld: 128 mg/dL — ABNORMAL HIGH (ref 70–99)
Potassium: 4.1 mmol/L (ref 3.5–5.1)
Sodium: 132 mmol/L — ABNORMAL LOW (ref 135–145)
Total Bilirubin: 14.4 mg/dL — ABNORMAL HIGH (ref 0.3–1.2)
Total Protein: 5.6 g/dL — ABNORMAL LOW (ref 6.5–8.1)

## 2021-11-01 LAB — RESP PANEL BY RT-PCR (FLU A&B, COVID) ARPGX2
Influenza A by PCR: NEGATIVE
Influenza B by PCR: NEGATIVE
SARS Coronavirus 2 by RT PCR: NEGATIVE

## 2021-11-01 LAB — CBC
HCT: 22.1 % — ABNORMAL LOW (ref 36.0–46.0)
Hemoglobin: 6.9 g/dL — CL (ref 12.0–15.0)
MCH: 31.4 pg (ref 26.0–34.0)
MCHC: 31.2 g/dL (ref 30.0–36.0)
MCV: 100.5 fL — ABNORMAL HIGH (ref 80.0–100.0)
Platelets: 52 10*3/uL — ABNORMAL LOW (ref 150–400)
RBC: 2.2 MIL/uL — ABNORMAL LOW (ref 3.87–5.11)
RDW: 17.2 % — ABNORMAL HIGH (ref 11.5–15.5)
WBC: 4.8 10*3/uL (ref 4.0–10.5)
nRBC: 0 % (ref 0.0–0.2)

## 2021-11-01 LAB — POC OCCULT BLOOD, ED: Fecal Occult Bld: NEGATIVE

## 2021-11-01 LAB — LIPASE, BLOOD: Lipase: 66 U/L — ABNORMAL HIGH (ref 11–51)

## 2021-11-01 LAB — PREPARE RBC (CROSSMATCH)

## 2021-11-01 LAB — APTT: aPTT: 50 seconds — ABNORMAL HIGH (ref 24–36)

## 2021-11-01 MED ORDER — SODIUM CHLORIDE 0.9% FLUSH
3.0000 mL | Freq: Two times a day (BID) | INTRAVENOUS | Status: DC
  Administered 2021-11-02: 3 mL via INTRAVENOUS

## 2021-11-01 MED ORDER — PROPRANOLOL HCL 20 MG PO TABS
20.0000 mg | ORAL_TABLET | Freq: Three times a day (TID) | ORAL | Status: DC
  Administered 2021-11-01: 20 mg via ORAL
  Filled 2021-11-01 (×3): qty 1

## 2021-11-01 MED ORDER — FUROSEMIDE 40 MG PO TABS
40.0000 mg | ORAL_TABLET | Freq: Two times a day (BID) | ORAL | Status: DC
  Administered 2021-11-01 – 2021-11-03 (×4): 40 mg via ORAL
  Filled 2021-11-01 (×4): qty 1

## 2021-11-01 MED ORDER — SODIUM CHLORIDE 0.9 % IV SOLN: 250.0000 mL | INTRAVENOUS | Status: DC | PRN

## 2021-11-01 MED ORDER — PANTOPRAZOLE SODIUM 40 MG PO TBEC
40.0000 mg | DELAYED_RELEASE_TABLET | Freq: Every day | ORAL | Status: DC
  Administered 2021-11-01 – 2021-11-03 (×3): 40 mg via ORAL
  Filled 2021-11-01 (×3): qty 1

## 2021-11-01 MED ORDER — SPIRONOLACTONE 25 MG PO TABS
100.0000 mg | ORAL_TABLET | Freq: Two times a day (BID) | ORAL | Status: DC
  Administered 2021-11-01 – 2021-11-03 (×4): 100 mg via ORAL
  Filled 2021-11-01 (×4): qty 4

## 2021-11-01 MED ORDER — SODIUM CHLORIDE 0.9% FLUSH: 3.0000 mL | INTRAVENOUS | Status: DC | PRN

## 2021-11-01 MED ORDER — FOLIC ACID 1 MG PO TABS
1.0000 mg | ORAL_TABLET | Freq: Every day | ORAL | Status: DC
  Administered 2021-11-01 – 2021-11-03 (×3): 1 mg via ORAL
  Filled 2021-11-01 (×3): qty 1

## 2021-11-01 MED ORDER — SODIUM CHLORIDE 0.9 % IV SOLN: 10.0000 mL/h | Freq: Once | INTRAVENOUS | Status: DC

## 2021-11-01 MED ORDER — RIFAXIMIN 550 MG PO TABS
550.0000 mg | ORAL_TABLET | Freq: Two times a day (BID) | ORAL | Status: DC
  Administered 2021-11-01 – 2021-11-03 (×4): 550 mg via ORAL
  Filled 2021-11-01 (×4): qty 1

## 2021-11-01 MED ORDER — LACTULOSE 10 GM/15ML PO SOLN
20.0000 g | Freq: Two times a day (BID) | ORAL | Status: DC
  Administered 2021-11-01 – 2021-11-03 (×4): 20 g via ORAL
  Filled 2021-11-01 (×4): qty 30
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
## 2021-11-01 NOTE — ED Notes (Signed)
Lab called this nurse to report positive antibody screen. Lab stated they would call back once they received the appropriate blood type to transfuse.
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
## 2021-11-01 NOTE — ED Triage Notes (Signed)
Abdominal swelling, history of cirrhosis, states her abdomen is swelling for the past 3 weeks

## 2021-11-01 NOTE — ED Triage Notes (Signed)
Patient also has rectal bleeding

## 2021-11-01 NOTE — Progress Notes (Signed)
UNMATCHED BLOOD PRODUCT NOTE  Compare the patient ID on the blood tag to the patient ID on the hospital armband and Blood Bank armband. Then confirm the unit number on the blood tag matches the unit number on the blood product.  If a discrepancy is discovered return the product to blood bank immediately.   Blood Product Type: Packed Red Blood Cells  Unit #: (Found on blood product bag, begins with U)[id number]  Product Code #: (Found on blood product bag, begins with E) [id number]   Start Time: 2053  Starting Rate: 120 ml/hr  Rate increase/decreased  (if applicable):      ml/hr  Rate changed time (if applicable):    Stop Time:  Blood verified by Ubaldo Glassing RN/Bree Wynetta Emery RN.  Blood product verified by lab before administration.   All Other Documentation should be documented within the Blood Admin Flowsheet per policy.

## 2021-11-01 NOTE — Progress Notes (Signed)
Patient arrived to 322 in NAD, VS stable. Patient oriented to room and call bell in reach.

## 2021-11-02 ENCOUNTER — Inpatient Hospital Stay (HOSPITAL_COMMUNITY): Payer: Medicaid Other

## 2021-11-02 ENCOUNTER — Encounter (HOSPITAL_COMMUNITY): Payer: Self-pay | Admitting: Osteopathic Medicine

## 2021-11-02 LAB — CBC
HCT: 20.2 % — ABNORMAL LOW (ref 36.0–46.0)
Hemoglobin: 6.2 g/dL — CL (ref 12.0–15.0)
MCH: 30.7 pg (ref 26.0–34.0)
MCHC: 30.7 g/dL (ref 30.0–36.0)
MCV: 100 fL (ref 80.0–100.0)
Platelets: 35 10*3/uL — ABNORMAL LOW (ref 150–400)
RBC: 2.02 MIL/uL — ABNORMAL LOW (ref 3.87–5.11)
RDW: 17.7 % — ABNORMAL HIGH (ref 11.5–15.5)
WBC: 3 10*3/uL — ABNORMAL LOW (ref 4.0–10.5)
nRBC: 0 % (ref 0.0–0.2)

## 2021-11-02 LAB — GRAM STAIN: Gram Stain: NONE SEEN

## 2021-11-02 LAB — CULTURE, BODY FLUID W GRAM STAIN -BOTTLE
Culture: NO GROWTH
Gram Stain: NONE SEEN

## 2021-11-02 LAB — COMPREHENSIVE METABOLIC PANEL
ALT: 20 U/L (ref 0–44)
AST: 39 U/L (ref 15–41)
Albumin: 1.6 g/dL — ABNORMAL LOW (ref 3.5–5.0)
Alkaline Phosphatase: 110 U/L (ref 38–126)
Anion gap: 7 (ref 5–15)
BUN: 13 mg/dL (ref 6–20)
CO2: 18 mmol/L — ABNORMAL LOW (ref 22–32)
Calcium: 7.5 mg/dL — ABNORMAL LOW (ref 8.9–10.3)
Chloride: 108 mmol/L (ref 98–111)
Creatinine, Ser: 0.69 mg/dL (ref 0.44–1.00)
GFR, Estimated: >60 mL/min (ref >60)
Glucose, Bld: 83 mg/dL (ref 70–99)
Potassium: 3.9 mmol/L (ref 3.5–5.1)
Sodium: 133 mmol/L — ABNORMAL LOW (ref 135–145)
Total Bilirubin: 11.2 mg/dL — ABNORMAL HIGH (ref 0.3–1.2)
Total Protein: 4.5 g/dL — ABNORMAL LOW (ref 6.5–8.1)

## 2021-11-02 LAB — PREPARE RBC (CROSSMATCH)

## 2021-11-02 MED ORDER — SODIUM CHLORIDE 0.9 % IV BOLUS
250.0000 mL | Freq: Once | INTRAVENOUS | Status: AC
  Administered 2021-11-02: 250 mL via INTRAVENOUS

## 2021-11-02 MED ORDER — SODIUM CHLORIDE 0.9% IV SOLUTION: Freq: Once | INTRAVENOUS | Status: AC

## 2021-11-02 MED ORDER — HYDROCORTISONE ACETATE 25 MG RE SUPP
25.0000 mg | Freq: Two times a day (BID) | RECTAL | Status: DC
  Administered 2021-11-02 – 2021-11-03 (×3): 25 mg via RECTAL
  Filled 2021-11-02 (×3): qty 1

## 2021-11-02 NOTE — Progress Notes (Addendum)
Victoria Gordon  YPP:[id number] DOB: [birth year] DOA: 11/01/2021 PCP: Patient, [name]    Brief Narrative:  41yo with a history of alcohol abuse and alcoholic cirrhosis of the liver, hepatitis B, major depression, PTSD, and hemorrhoids who presented to the ED with complaints of generalized abdominal pain for 2 weeks with significant nausea as well as bright red blood per rectum with each bowel movement for approximately 2 months.  In the ED she was found to have a blood pressure of 91/49 with hemoglobin of 6.9 and platelet count of 52.  Rectal exam confirmed hemorrhoids.  She was transfused with 1 unit PRBC in the ED.  INR was noted to be 2.9 consistent with her baseline.  Consultants:  None  Code Status: FULL CODE  Antimicrobials:  None  DVT prophylaxis: SCDs  Interim Hx: Afebrile.  Blood pressure 45-80 systolic but appears stable.  Saturation 99% room air.  Renal function stable.  Hemoglobin declined again to 6.2.  Platelet count 35. Resting comfortably s/p thoracentesis. Paracentesis not able to be accomplished due to paucity of ascites.   Assessment & Plan:  Acute blood loss anemia Transfuse as needed to keep hemoglobin 7.0 or > - transfuse an additional 2U PRBC today for a total of 3U thus far this admission   Lower GI bleed / hemorrhoids Exacerbated by coagulopathy - add steroid suppository to try to calm hemorrhoids   Coagulopathy due to cirrhosis Unable to take vitamin K due to reported allergic response   Thrombocytopenia Due to cirrhosis  Cirrhosis of the liver due to alcohol - known esophageal varices /portal HTN - ascites Continue home lactulose, rifaximin, Aldactone, Lasix - hold propranolol in setting of hypotension - no signif ascites via [place] in radiology today   Pleural effusion Likely hepatic hydrothorax - thoracentesis ordered by admitting MD yielding ~1.5L - f/u studies - likely to recur - follow clinically   Family Communication: no family  present at time of exam  Disposition: return home once Hgb stable/bleeding has ceased   Objective: Blood pressure (!) 93/54, pulse 62, temperature 98.3 F (36.8 C), temperature source Oral, resp. rate 17, height 5\' 1"  (1.549 m), weight 87.5 kg, SpO2 99 %.  Intake/Output Summary (Last 24 hours) at 11/02/2021 0919 Last data filed at 11/02/2021 0352 Gross per 24 hour  Intake 450.06 ml  Output --  Net 450.06 ml   Filed Weights   11/01/21 1808  Weight: 87.5 kg    Examination: General: No acute respiratory distress Lungs: Clear to auscultation bilaterally without wheezes or crackles Cardiovascular: Regular rate and rhythm without murmur gallop or rub normal S1 and S2 Abdomen: Nontender, nondistended, soft, bowel sounds positive, no rebound, no ascites, no appreciable mass Extremities: trace B LE edema w/o cyanosis   CBC: Recent Labs  Lab 11/01/21 1331 11/02/21 0523  WBC 4.8 3.0*  HGB 6.9* 6.2*  HCT 22.1* 20.2*  MCV 100.5* 100.0  PLT 52* 35*   Basic Metabolic Panel: Recent Labs  Lab 11/01/21 1331 11/02/21 0523  NA 132* 133*  K 4.1 3.9  CL 106 108  CO2 20* 18*  GLUCOSE 128* 83  BUN 13 13  CREATININE 0.81 0.69  CALCIUM 7.5* 7.5*   GFR: Estimated Creatinine Clearance: 95.9 mL/min (by C-G formula based on SCr of 0.69 mg/dL).  Liver Function Tests: Recent Labs  Lab 11/01/21 1331 11/02/21 0523  AST 51* 39  ALT 23 20  ALKPHOS 123 110  BILITOT 14.4* 11.2*  PROT 5.6* 4.5*  ALBUMIN 2.0* 1.6*   Recent Labs  Lab 11/01/21 1331  LIPASE 66*    Coagulation Profile: Recent Labs  Lab 11/01/21 1331  INR 2.9*    HbA1C: Hgb A1c MFr Bld  Date/Time Value Ref Range Status  02/02/2016 06:13 AM 5.7 (H) 4.8 - 5.6 % Final    Comment:    (NOTE)         Pre-diabetes: 5.7 - 6.4         Diabetes: >6.4         Glycemic control for adults with diabetes: <7.0      Scheduled Meds:  folic acid  1 mg Oral Daily   furosemide  40 mg Oral BID   lactulose  20 g Oral BID    pantoprazole  40 mg Oral Daily   propranolol  20 mg Oral TID   rifaximin  550 mg Oral BID   sodium chloride flush  3 mL Intravenous Q12H   spironolactone  100 mg Oral BID   Continuous Infusions:  sodium chloride     sodium chloride       LOS: 1 day   Cherene Altes, MD [facility] Office  [phone] Pager - Text Page per Shea Evans  If 7PM-7AM, please contact night-coverage per Amion 11/02/2021, 9:19 AM

## 2021-11-02 NOTE — Progress Notes (Signed)
PT tolerated right sided thoracentesis procedure well today and 1.55 Liters of serosanguinous fluid removed with labs collected and sent to lab for processing. PT left via stretcher at this time for chest xray with NAD and verbalized understanding of post procedure instructions.

## 2021-11-02 NOTE — Procedures (Signed)
PreOperative Dx: Alcoholic cirrhosis, RT pleural effusion Postoperative Dx: Alcoholic cirrhosis, RT  pleural effusion Procedure:   US guided RT thoracentesis Radiologist:  Thornton Papas Anesthesia:  10 ml of 1% lidocaine Specimen:  1.55 L of serosanguinous colored fluid EBL:   < 1 ml Complications: None

## 2021-11-03 ENCOUNTER — Encounter (HOSPITAL_COMMUNITY): Payer: Self-pay | Admitting: Osteopathic Medicine

## 2021-11-03 LAB — HEMOGLOBIN AND HEMATOCRIT, BLOOD
HCT: 26.1 % — ABNORMAL LOW (ref 36.0–46.0)
Hemoglobin: 8.4 g/dL — ABNORMAL LOW (ref 12.0–15.0)

## 2021-11-03 LAB — CBC
HCT: 24.6 % — ABNORMAL LOW (ref 36.0–46.0)
Hemoglobin: 7.7 g/dL — ABNORMAL LOW (ref 12.0–15.0)
MCH: 28.2 pg (ref 26.0–34.0)
MCHC: 31.3 g/dL (ref 30.0–36.0)
MCV: 90.1 fL (ref 80.0–100.0)
Platelets: 30 10*3/uL — ABNORMAL LOW (ref 150–400)
RBC: 2.73 MIL/uL — ABNORMAL LOW (ref 3.87–5.11)
RDW: 22 % — ABNORMAL HIGH (ref 11.5–15.5)
WBC: 3.7 10*3/uL — ABNORMAL LOW (ref 4.0–10.5)
nRBC: 0.5 % — ABNORMAL HIGH (ref 0.0–0.2)

## 2021-11-03 LAB — COMPREHENSIVE METABOLIC PANEL
ALT: 22 U/L (ref 0–44)
AST: 41 U/L (ref 15–41)
Albumin: 1.7 g/dL — ABNORMAL LOW (ref 3.5–5.0)
Alkaline Phosphatase: 106 U/L (ref 38–126)
Anion gap: 6 (ref 5–15)
BUN: 12 mg/dL (ref 6–20)
CO2: 20 mmol/L — ABNORMAL LOW (ref 22–32)
Calcium: 7.4 mg/dL — ABNORMAL LOW (ref 8.9–10.3)
Chloride: 106 mmol/L (ref 98–111)
Creatinine, Ser: 0.71 mg/dL (ref 0.44–1.00)
GFR, Estimated: >60 mL/min (ref >60)
Glucose, Bld: 99 mg/dL (ref 70–99)
Potassium: 3.5 mmol/L (ref 3.5–5.1)
Sodium: 132 mmol/L — ABNORMAL LOW (ref 135–145)
Total Bilirubin: 11.9 mg/dL — ABNORMAL HIGH (ref 0.3–1.2)
Total Protein: 4.6 g/dL — ABNORMAL LOW (ref 6.5–8.1)

## 2021-11-03 LAB — CYTOLOGY - NON PAP

## 2021-11-03 MED ORDER — HYDROCORTISONE ACETATE 25 MG RE SUPP: 25.0000 mg | Freq: Two times a day (BID) | RECTAL | 0 refills | Status: DC

## 2021-11-03 NOTE — TOC Progression Note (Signed)
Transition of Care [facility]) - Progression Note    Patient Details  Name: Honestie Kulik MRN: [id number] Date of Birth: 09-14-1981  Transition of Care [facility]) CM/SW Contact  Salome Arnt, [place] Phone Number: 11/03/2021, 11:06 AM  Clinical Narrative:   Transition of Care [facility]) Screening Note   Patient Details  Name: Ora Bollig Date of Birth: 03-31-1981   Transition of Care [facility]) CM/SW Contact:    Salome Arnt, [place] Phone Number: 11/03/2021, 11:06 AM    Transition of Care Department [facility]) has reviewed patient and no TOC needs have been identified at this time. We will continue to monitor patient advancement through interdisciplinary progression rounds. If new patient transition needs arise, please place a TOC consult.         Barriers to Discharge: Continued Medical Work up  Expected Discharge Plan and Services                                                 Social Determinants of Health (SDOH) Interventions    Readmission Risk Interventions No flowsheet data found.

## 2021-11-03 NOTE — Discharge Summary (Signed)
Physician Discharge Summary  Storm Dulski INO:[id number] DOB: 1981/08/08 DOA: 11/01/2021  PCP: Patient, [name]  Admit date: 11/01/2021 Discharge date: 11/03/2021  Discharge disposition: Home   Recommendations for Outpatient Follow-Up:   Recommend follow-up with a primary care physician in 1 to 2 weeks   Discharge Diagnosis:   Principal Problem:   Symptomatic anemia Active Problems:   Ascites   Alcoholic cirrhosis of liver with ascites (HCC)   Pleural effusion   Pleural effusion associated with hepatic disorder    Discharge Condition: Stable.  Diet recommendation:  Diet Order             Diet - low sodium heart healthy           Diet regular Room service appropriate? Yes; Fluid consistency: Thin  Diet effective now                     Code Status: Full Code     Hospital Course:   Ms. Laporshia Hogen is a 41 year old woman with history of alcohol use disorder, alcoholic liver cirrhosis, hepatitis B, major depression, PTSD, hemorrhoids, who presented to the hospital with generalized abdominal pain, nausea and rectal bleeding after each bowel movement.  Symptoms have been going on for about 2 weeks.  She was hypotensive and anemic on admission.  Her hemoglobin was 6.9 and platelets, 32,000 (from liver cirrhosis).  Rectal exam revealed hemorrhoids.  She was transfused with 1 units of packed red blood cells and she was hydrated with IV fluids.  She underwent right-sided thoracentesis for right pleural effusion and 1.55 L of pleural fluid was removed.  Her condition has improved.  BP has improved and H&H have improved post blood transfusion.  Though she still has some rectal bleeding, she said that she has actually had some bloody stools with defecation for over 3 months because of hemorrhoids.  Her condition has improved and she is deemed stable for discharge to home today.  Outpatient follow-up with the PCP and gastroenterologist was  recommended.  Discharge plan was discussed with the patient and her mother at the bedside.      Discharge Exam:    Vitals:   11/02/21 2108 11/03/21 0522 11/03/21 0533 11/03/21 1218  BP: (!) 90/50 (!) 79/41 (!) 92/50 (!) 113/53  Pulse: 67 66 63 63  Resp: 18 20 18 18   Temp: 98.8 F (37.1 C) 98.1 F (36.7 C)  97.8 F (36.6 C)  TempSrc: Oral   Oral  SpO2: 99% 98%  99%  Weight:  88.8 kg    Height:         GEN: NAD SKIN: No rash EYES: EOMI, icteric ENT: MMM CV: RRR PULM: CTA B ABD: soft, ND, NT, +BS CNS: AAO x 3, non focal EXT: No edema or tenderness   The results of significant diagnostics from this hospitalization (including imaging, microbiology, ancillary and laboratory) are listed below for reference.     Procedures and Diagnostic Studies:   DG Chest 1 View  Result Date: 11/02/2021 CLINICAL DATA:  RIGHT pleural effusion post thoracentesis EXAM: CHEST  1 VIEW COMPARISON:  11/01/2021 FINDINGS: Extra tori technique. Decreased RIGHT pleural effusion when accounting for expiratory technique. Mild RIGHT basilar atelectasis. No pneumothorax following thoracentesis. Normal heart size and pulmonary vascularity. 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 LEFT lung clear. IMPRESSION: No pneumothorax following RIGHT thoracentesis. Electronically Signed   By: Lavonia Dana M.D.   On:  11/02/2021 11:08   DG CHEST PORT 1 VIEW  Result Date: 11/01/2021 CLINICAL DATA:  Shortness of breath.  Cirrhosis. EXAM: PORTABLE CHEST 1 VIEW COMPARISON:  CT chest abdomen and pelvis 08/20/2021. Chest x-ray 08/20/2021. FINDINGS: There is a moderate to large right pleural effusion which has increased. Left lung is clear. Cardiomediastinal silhouette within normal limits. No pneumothorax. No acute fractures. IMPRESSION: 1. Moderate to large right pleural effusion has increased. Can not exclude underlying atelectasis/airspace disease in the right lung base. Electronically  Signed   By: Ronney Asters M.D.   On: 11/01/2021 17:19   [place] ASCITES (ABDOMEN LIMITED)  Result Date: 11/02/2021 CLINICAL DATA:  Cirrhosis, ascites, question sufficient for paracentesis EXAM: LIMITED ABDOMEN ULTRASOUND FOR ASCITES TECHNIQUE: Limited ultrasound survey for ascites was performed in all four abdominal quadrants. COMPARISON:  CT abdomen and pelvis 08/20/2021 FINDINGS: Trace ascites identified upon survey imaging of the 4 quadrants. Volume of ascites is insufficient for paracentesis. IMPRESSION: Trace ascites, insufficient for paracentesis. Electronically Signed   By: Lavonia Dana M.D.   On: 11/02/2021 09:53   US THORACENTESIS ASP PLEURAL SPACE W/IMG GUIDE  Result Date: 11/02/2021 INDICATION: Cirrhosis, RIGHT pleural effusion EXAM: ULTRASOUND GUIDED DIAGNOSTIC AND THERAPEUTIC RIGHT THORACENTESIS MEDICATIONS: None. COMPLICATIONS: None immediate. PROCEDURE: An ultrasound guided thoracentesis was thoroughly discussed with the patient and questions answered. The benefits, risks, alternatives and complications were also discussed. The patient understands and wishes to proceed with the procedure. Written consent was obtained. Ultrasound was performed to localize and mark an adequate pocket of fluid in the RIGHT chest. The area was then prepped and draped in the normal sterile fashion. 1% Lidocaine was used for local anesthesia. Under ultrasound guidance a 8 French thoracentesis catheter was introduced. Thoracentesis was performed. The catheter was removed and a dressing applied. FINDINGS: A total of approximately 1.55 L of serosanguineous fluid was removed. Samples were sent to the laboratory as requested by the clinical team. IMPRESSION: Successful ultrasound guided RIGHT thoracentesis yielding 1.55 L of pleural fluid. Electronically Signed   By: Lavonia Dana M.D.   On: 11/02/2021 11:06     Labs:   Basic Metabolic Panel: Recent Labs  Lab 11/01/21 1331 11/02/21 0523 11/03/21 0611  NA 132* 133*  132*  K 4.1 3.9 3.5  CL 106 108 106  CO2 20* 18* 20*  GLUCOSE 128* 83 99  BUN 13 13 12   CREATININE 0.81 0.69 0.71  CALCIUM 7.5* 7.5* 7.4*   GFR Estimated Creatinine Clearance: 96.6 mL/min (by C-G formula based on SCr of 0.71 mg/dL). Liver Function Tests: Recent Labs  Lab 11/01/21 1331 11/02/21 0523 11/03/21 0611  AST 51* 39 41  ALT 23 20 22   ALKPHOS 123 110 106  BILITOT 14.4* 11.2* 11.9*  PROT 5.6* 4.5* 4.6*  ALBUMIN 2.0* 1.6* 1.7*   Recent Labs  Lab 11/01/21 1331  LIPASE 66*   No results for input(s): AMMONIA in the last 168 hours. Coagulation profile Recent Labs  Lab 11/01/21 1331  INR 2.9*    CBC: Recent Labs  Lab 11/01/21 1331 11/02/21 0523 11/03/21 0611 11/03/21 1356  WBC 4.8 3.0* 3.7*  --   HGB 6.9* 6.2* 7.7* 8.4*  HCT 22.1* 20.2* 24.6* 26.1*  MCV 100.5* 100.0 90.1  --   PLT 52* 35* 30*  --    Cardiac Enzymes: No results for input(s): CKTOTAL, CKMB, CKMBINDEX, TROPONINI in the last 168 hours. BNP: Invalid input(s): POCBNP CBG: No results for input(s): GLUCAP in the last 168 hours. D-Dimer No results for  input(s): DDIMER in the last 72 hours. Hgb A1c No results for input(s): HGBA1C in the last 72 hours. Lipid Profile No results for input(s): CHOL, HDL, LDLCALC, TRIG, CHOLHDL, LDLDIRECT in the last 72 hours. Thyroid function studies No results for input(s): TSH, T4TOTAL, T3FREE, THYROIDAB in the last 72 hours.  Invalid input(s): FREET3 Anemia work up No results for input(s): VITAMINB12, FOLATE, FERRITIN, TIBC, IRON, RETICCTPCT in the last 72 hours. Microbiology Recent Results (from the past 240 hour(s))  Resp Panel by RT-PCR (Flu A&B, Covid) Nasopharyngeal Swab     Status: None   Collection Time: 11/01/21  5:30 PM   Specimen: Nasopharyngeal Swab; Nasopharyngeal(NP) swabs in vial transport medium  Result Value Ref Range Status   SARS Coronavirus 2 by RT PCR NEGATIVE NEGATIVE Final    Comment: (NOTE) SARS-CoV-2 target nucleic acids are NOT  DETECTED.  The SARS-CoV-2 RNA is generally detectable in upper respiratory specimens during the acute phase of infection. The lowest concentration of SARS-CoV-2 viral copies this assay can detect is 138 copies/mL. A negative result does not preclude SARS-Cov-2 infection and should not be used as the sole basis for treatment or other patient management decisions. A negative result may occur with  improper specimen collection/handling, submission of specimen other than nasopharyngeal swab, presence of viral mutation(s) within the areas targeted by this assay, and inadequate number of viral copies(<138 copies/mL). A negative result must be combined with clinical observations, patient history, and epidemiological information. The expected result is Negative.  Fact Sheet for Patients:  EntrepreneurPulse.com.au  Fact Sheet for Healthcare Providers:  IncredibleEmployment.be  This test is no t yet approved or cleared by the [place] FDA and  has been authorized for detection and/or diagnosis of SARS-CoV-2 by FDA under an Emergency Use Authorization (EUA). This EUA will remain  in effect (meaning this test can be used) for the duration of the COVID-19 declaration under Section 564(b)(1) of the Act, 21 U.S.C.section 360bbb-3(b)(1), unless the authorization is terminated  or revoked sooner.       Influenza A by PCR NEGATIVE NEGATIVE Final   Influenza B by PCR NEGATIVE NEGATIVE Final    Comment: (NOTE) The Xpert Xpress SARS-CoV-2/FLU/RSV plus assay is intended as an aid in the diagnosis of influenza from Nasopharyngeal swab specimens and should not be used as a sole basis for treatment. Nasal washings and aspirates are unacceptable for Xpert Xpress SARS-CoV-2/FLU/RSV testing.  Fact Sheet for Patients: EntrepreneurPulse.com.au  Fact Sheet for Healthcare Providers: IncredibleEmployment.be  This test is not yet  approved or cleared by the [place] FDA and has been authorized for detection and/or diagnosis of SARS-CoV-2 by FDA under an Emergency Use Authorization (EUA). This EUA will remain in effect (meaning this test can be used) for the duration of the COVID-19 declaration under Section 564(b)(1) of the Act, 21 U.S.C. section 360bbb-3(b)(1), unless the authorization is terminated or revoked.  Performed at [facility], [address] [place], [place] [address]   Gram stain     Status: None   Collection Time: 11/02/21 10:35 AM   Specimen: Pleura  Result Value Ref Range Status   Specimen Description PLEURAL  Final   Special Requests NONE  Final   Gram Stain   Final    NO ORGANISMS SEEN WBC PRESENT,BOTH PMN AND MONONUCLEAR CYTOSPIN SMEAR Performed at [facility], [address] [place], [place] [address]    Report Status 11/02/2021 FINAL  Final  Culture, body fluid w Gram Stain-bottle     Status: None  Collection Time: 11/02/21 10:35 AM   Specimen: Pleura  Result Value Ref Range Status   Specimen Description PLEURAL  Final   Special Requests BOTTLES DRAWN AEROBIC AND ANAEROBIC 10 CC  Final   Gram Stain   Final    NO ORGANISMS SEEN WBC PRESENT,BOTH PMN AND MONONUCLEAR CYTOSPIN SMEAR    Culture   Final    NO GROWTH < 12 HOURS Performed at [facility], [address], [place], [place] [address]    Report Status 11/02/2021 FINAL  Final     Discharge Instructions:   Discharge Instructions     Diet - low sodium heart healthy   Complete by: As directed    Increase activity slowly   Complete by: As directed       Allergies as of 11/03/2021       Reactions   Vitamin K And Related Hives, Shortness Of Breath, Itching, Rash   Tolerated on 08/21/21 without reaction   Vancomycin Hives, Itching        Medication List     TAKE these medications    ciprofloxacin 500 MG tablet Commonly known as: CIPRO Take 1 tablet (500 mg total) by mouth daily.   folic acid 1 MG  tablet Commonly known as: FOLVITE Take 1 tablet (1 mg total) by mouth daily.   furosemide 40 MG tablet Commonly known as: LASIX Take 1 tablet (40 mg total) by mouth 2 (two) times daily.   hydrocortisone 25 MG suppository Commonly known as: ANUSOL-HC Place 1 suppository (25 mg total) rectally 2 (two) times daily.   lactulose 10 GM/15ML solution Commonly known as: CHRONULAC Take 30 mLs (20 g total) by mouth 2 (two) times daily.   pantoprazole 40 MG tablet Commonly known as: PROTONIX Take 1 tablet (40 mg total) by mouth daily.   propranolol 20 MG tablet Commonly known as: INDERAL Take 1 tablet (20 mg total) by mouth 3 (three) times daily.   rifaximin 550 MG Tabs tablet Commonly known as: XIFAXAN Take 1 tablet (550 mg total) by mouth 2 (two) times daily.   spironolactone 100 MG tablet Commonly known as: ALDACTONE Take 1 tablet (100 mg total) by mouth 2 (two) times daily.           If you experience worsening of your admission symptoms, develop shortness of breath, life threatening emergency, suicidal or homicidal thoughts you must seek medical attention immediately by calling 911 or calling your MD immediately  if symptoms less severe.   You must read complete instructions/literature along with all the possible adverse reactions/side effects for all the medicines you take and that have been prescribed to you. Take any new medicines after you have completely understood and accept all the possible adverse reactions/side effects.    Please note   You were cared for by a hospitalist during your hospital stay. If you have any questions about your discharge medications or the care you received while you were in the hospital after you are discharged, you can call the unit and asked to speak with the hospitalist on call if the hospitalist that took care of you is not available. Once you are discharged, your primary care physician will handle any further medical issues. Please note  that NO REFILLS for any discharge medications will be authorized once you are discharged, as it is imperative that you return to your primary care physician (or establish a relationship with a primary care physician if you do not have one) for your aftercare needs so  that they can reassess your need for medications and monitor your lab values.       Time coordinating discharge: 32 minutes  Signed:  Larra Crunkleton  [facility] 11/03/2021, 3:26 PM   Pager on [email or web address]. If 7PM-7AM, please contact night-coverage at [email or web address]

## 2021-11-03 NOTE — Progress Notes (Incomplete)
Progress Note    Victoria Gordon  NFA:[id number] DOB: 11/23/1980  DOA: 11/01/2021 PCP: Patient, [name]      Brief Narrative:    Medical records reviewed and are as summarized below:  Victoria Gordon is a 41 y.o. female ***      Assessment/Plan:   Principal Problem:   Symptomatic anemia Active Problems:   Ascites   Alcoholic cirrhosis of liver with ascites (HCC)   Pleural effusion   Pleural effusion associated with hepatic disorder          Body mass index is 36.99 kg/m.  Diet Order             Diet regular Room service appropriate? Yes; Fluid consistency: Thin  Diet effective now                      Consultants: ***  Procedures: ***    Medications:    folic acid  1 mg Oral Daily   furosemide  40 mg Oral BID   hydrocortisone  25 mg Rectal BID   lactulose  20 g Oral BID   pantoprazole  40 mg Oral Daily   rifaximin  550 mg Oral BID   spironolactone  100 mg Oral BID   Continuous Infusions:   Anti-infectives (From admission, onward)    Start     Dose/Rate Route Frequency Ordered Stop   11/01/21 2200  rifaximin (XIFAXAN) tablet 550 mg        550 mg Oral 2 times daily 11/01/21 1658                Family Communication/Anticipated D/C date and plan/Code Status   DVT prophylaxis: SCDs Start: 11/01/21 1658     Code Status: Full Code  Family Communication: *** Disposition Plan: ***   Status is: Inpatient  {Inpatient:23812}           Subjective:   ***  Objective:    Vitals:   11/02/21 1911 11/02/21 2108 11/03/21 0522 11/03/21 0533  BP: (!) 99/53 (!) 90/50 (!) 79/41 (!) 92/50  Pulse: 66 67 66 63  Resp: 18 18 20 18   Temp: 98.2 F (36.8 C) 98.8 F (37.1 C) 98.1 F (36.7 C)   TempSrc: Oral Oral    SpO2: 99% 99% 98%   Weight:   88.8 kg   Height:       No data found.   Intake/Output Summary (Last 24 hours) at 11/03/2021 1055 Last data filed at 11/03/2021 0957 Gross per  24 hour  Intake 1400 ml  Output --  Net 1400 ml   Filed Weights   11/01/21 1808 11/03/21 0522  Weight: 87.5 kg 88.8 kg    Exam:  ***       Data Reviewed:   I have personally reviewed following labs and imaging studies:  Labs: Labs show the following:   Basic Metabolic Panel: Recent Labs  Lab 11/01/21 1331 11/02/21 0523 11/03/21 0611  NA 132* 133* 132*  K 4.1 3.9 3.5  CL 106 108 106  CO2 20* 18* 20*  GLUCOSE 128* 83 99  BUN 13 13 12   CREATININE 0.81 0.69 0.71  CALCIUM 7.5* 7.5* 7.4*   GFR Estimated Creatinine Clearance: 96.6 mL/min (by C-G formula based on SCr of 0.71 mg/dL). Liver Function Tests: Recent Labs  Lab 11/01/21 1331 11/02/21 0523 11/03/21 0611  AST 51* 39 41  ALT 23 20 22   ALKPHOS 123 110 106  BILITOT 14.4* 11.2* 11.9*  PROT 5.6* 4.5* 4.6*  ALBUMIN 2.0* 1.6* 1.7*   Recent Labs  Lab 11/01/21 1331  LIPASE 66*   No results for input(s): AMMONIA in the last 168 hours. Coagulation profile Recent Labs  Lab 11/01/21 1331  INR 2.9*    CBC: Recent Labs  Lab 11/01/21 1331 11/02/21 0523 11/03/21 0611  WBC 4.8 3.0* 3.7*  HGB 6.9* 6.2* 7.7*  HCT 22.1* 20.2* 24.6*  MCV 100.5* 100.0 90.1  PLT 52* 35* 30*   Cardiac Enzymes: No results for input(s): CKTOTAL, CKMB, CKMBINDEX, TROPONINI in the last 168 hours. BNP (last 3 results) No results for input(s): PROBNP in the last 8760 hours. CBG: No results for input(s): GLUCAP in the last 168 hours. D-Dimer: No results for input(s): DDIMER in the last 72 hours. Hgb A1c: No results for input(s): HGBA1C in the last 72 hours. Lipid Profile: No results for input(s): CHOL, HDL, LDLCALC, TRIG, CHOLHDL, LDLDIRECT in the last 72 hours. Thyroid function studies: No results for input(s): TSH, T4TOTAL, T3FREE, THYROIDAB in the last 72 hours.  Invalid input(s): FREET3 Anemia work up: No results for input(s): VITAMINB12, FOLATE, FERRITIN, TIBC, IRON, RETICCTPCT in the last 72 hours. Sepsis  Labs: Recent Labs  Lab 11/01/21 1331 11/02/21 0523 11/03/21 0611  WBC 4.8 3.0* 3.7*    Microbiology Recent Results (from the past 240 hour(s))  Resp Panel by RT-PCR (Flu A&B, Covid) Nasopharyngeal Swab     Status: None   Collection Time: 11/01/21  5:30 PM   Specimen: Nasopharyngeal Swab; Nasopharyngeal(NP) swabs in vial transport medium  Result Value Ref Range Status   SARS Coronavirus 2 by RT PCR NEGATIVE NEGATIVE Final    Comment: (NOTE) SARS-CoV-2 target nucleic acids are NOT DETECTED.  The SARS-CoV-2 RNA is generally detectable in upper respiratory specimens during the acute phase of infection. The lowest concentration of SARS-CoV-2 viral copies this assay can detect is 138 copies/mL. A negative result does not preclude SARS-Cov-2 infection and should not be used as the sole basis for treatment or other patient management decisions. A negative result may occur with  improper specimen collection/handling, submission of specimen other than nasopharyngeal swab, presence of viral mutation(s) within the areas targeted by this assay, and inadequate number of viral copies(<138 copies/mL). A negative result must be combined with clinical observations, patient history, and epidemiological information. The expected result is Negative.  Fact Sheet for Patients:  EntrepreneurPulse.com.au  Fact Sheet for Healthcare Providers:  IncredibleEmployment.be  This test is no t yet approved or cleared by the [place] FDA and  has been authorized for detection and/or diagnosis of SARS-CoV-2 by FDA under an Emergency Use Authorization (EUA). This EUA will remain  in effect (meaning this test can be used) for the duration of the COVID-19 declaration under Section 564(b)(1) of the Act, 21 U.S.C.section 360bbb-3(b)(1), unless the authorization is terminated  or revoked sooner.       Influenza A by PCR NEGATIVE NEGATIVE Final   Influenza B by PCR  NEGATIVE NEGATIVE Final    Comment: (NOTE) The Xpert Xpress SARS-CoV-2/FLU/RSV plus assay is intended as an aid in the diagnosis of influenza from Nasopharyngeal swab specimens and should not be used as a sole basis for treatment. Nasal washings and aspirates are unacceptable for Xpert Xpress SARS-CoV-2/FLU/RSV testing.  Fact Sheet for Patients: EntrepreneurPulse.com.au  Fact Sheet for Healthcare Providers: IncredibleEmployment.be  This test is not yet approved or cleared by the [place] FDA and has been authorized for detection and/or  diagnosis of SARS-CoV-2 by FDA under an Emergency Use Authorization (EUA). This EUA will remain in effect (meaning this test can be used) for the duration of the COVID-19 declaration under Section 564(b)(1) of the Act, 21 U.S.C. section 360bbb-3(b)(1), unless the authorization is terminated or revoked.  Performed at [facility], [address], [place], [place] [address]   Gram stain     Status: None   Collection Time: 11/02/21 10:35 AM   Specimen: Pleura  Result Value Ref Range Status   Specimen Description PLEURAL  Final   Special Requests NONE  Final   Gram Stain   Final    NO ORGANISMS SEEN WBC PRESENT,BOTH PMN AND MONONUCLEAR CYTOSPIN SMEAR Performed at [facility], [address], [place], [place] [address]    Report Status 11/02/2021 FINAL  Final  Culture, body fluid w Gram Stain-bottle     Status: None   Collection Time: 11/02/21 10:35 AM   Specimen: Pleura  Result Value Ref Range Status   Specimen Description PLEURAL  Final   Special Requests BOTTLES DRAWN AEROBIC AND ANAEROBIC 10 CC  Final   Gram Stain   Final    NO ORGANISMS SEEN WBC PRESENT,BOTH PMN AND MONONUCLEAR CYTOSPIN SMEAR    Culture   Final    NO GROWTH < 12 HOURS Performed at [facility], [address] [place],  [address]    Report Status 11/02/2021 FINAL  Final    Procedures and diagnostic studies:  DG Chest  1 View  Result Date: 11/02/2021 CLINICAL DATA:  RIGHT pleural effusion post thoracentesis EXAM: CHEST  1 VIEW COMPARISON:  11/01/2021 FINDINGS: Extra tori technique. Decreased RIGHT pleural effusion when accounting for expiratory technique. Mild RIGHT basilar atelectasis. No pneumothorax following thoracentesis. Normal heart size and pulmonary vascularity. 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 LEFT lung clear. IMPRESSION: No pneumothorax following RIGHT thoracentesis. Electronically Signed   By: Lavonia Dana M.D.   On: 11/02/2021 11:08   DG CHEST PORT 1 VIEW  Result Date: 11/01/2021 CLINICAL DATA:  Shortness of breath.  Cirrhosis. EXAM: PORTABLE CHEST 1 VIEW COMPARISON:  CT chest abdomen and pelvis 08/20/2021. Chest x-ray 08/20/2021. FINDINGS: There is a moderate to large right pleural effusion which has increased. Left lung is clear. Cardiomediastinal silhouette within normal limits. No pneumothorax. No acute fractures. IMPRESSION: 1. Moderate to large right pleural effusion has increased. Can not exclude underlying atelectasis/airspace disease in the right lung base. Electronically Signed   By: Ronney Asters M.D.   On: 11/01/2021 17:19   [place] ASCITES (ABDOMEN LIMITED)  Result Date: 11/02/2021 CLINICAL DATA:  Cirrhosis, ascites, question sufficient for paracentesis EXAM: LIMITED ABDOMEN ULTRASOUND FOR ASCITES TECHNIQUE: Limited ultrasound survey for ascites was performed in all four abdominal quadrants. COMPARISON:  CT abdomen and pelvis 08/20/2021 FINDINGS: Trace ascites identified upon survey imaging of the 4 quadrants. Volume of ascites is insufficient for paracentesis. IMPRESSION: Trace ascites, insufficient for paracentesis. Electronically Signed   By: Lavonia Dana M.D.   On: 11/02/2021 09:53   US THORACENTESIS ASP PLEURAL SPACE W/IMG GUIDE  Result Date: 11/02/2021 INDICATION: Cirrhosis, RIGHT pleural effusion EXAM: ULTRASOUND GUIDED DIAGNOSTIC AND THERAPEUTIC  RIGHT THORACENTESIS MEDICATIONS: None. COMPLICATIONS: None immediate. PROCEDURE: An ultrasound guided thoracentesis was thoroughly discussed with the patient and questions answered. The benefits, risks, alternatives and complications were also discussed. The patient understands and wishes to proceed with the procedure. Written consent was obtained. Ultrasound was performed to localize and mark an adequate pocket of  fluid in the RIGHT chest. The area was then prepped and draped in the normal sterile fashion. 1% Lidocaine was used for local anesthesia. Under ultrasound guidance a 8 French thoracentesis catheter was introduced. Thoracentesis was performed. The catheter was removed and a dressing applied. FINDINGS: A total of approximately 1.55 L of serosanguineous fluid was removed. Samples were sent to the laboratory as requested by the clinical team. IMPRESSION: Successful ultrasound guided RIGHT thoracentesis yielding 1.55 L of pleural fluid. Electronically Signed   By: Lavonia Dana M.D.   On: 11/02/2021 11:06               LOS: 2 days   Shireen Rayburn  [facility]   Pager on [email or web address]. If 7PM-7AM, please contact night-coverage at [email or web address]     11/03/2021, 10:55 AM

## 2021-11-05 ENCOUNTER — Other Ambulatory Visit: Payer: Self-pay

## 2021-11-05 ENCOUNTER — Encounter (HOSPITAL_COMMUNITY): Payer: Self-pay | Admitting: Family Medicine

## 2021-11-05 ENCOUNTER — Emergency Department (HOSPITAL_COMMUNITY): Payer: Medicaid Other

## 2021-11-05 ENCOUNTER — Inpatient Hospital Stay (HOSPITAL_COMMUNITY)
Admission: EM | Admit: 2021-11-05 | Discharge: 2021-11-11 | DRG: 433 | Disposition: A | Discharge status: Home / Self Care | Payer: Medicaid Other | Attending: Family Medicine | Admitting: Family Medicine

## 2021-11-05 DIAGNOSIS — F329 Major depressive disorder, single episode, unspecified: Secondary | ICD-10-CM | POA: Diagnosis present

## 2021-11-05 DIAGNOSIS — D696 Thrombocytopenia, unspecified: Secondary | ICD-10-CM | POA: Diagnosis present

## 2021-11-05 DIAGNOSIS — D61818 Other pancytopenia: Secondary | ICD-10-CM | POA: Diagnosis present

## 2021-11-05 DIAGNOSIS — K769 Liver disease, unspecified: Secondary | ICD-10-CM | POA: Diagnosis not present

## 2021-11-05 DIAGNOSIS — Z833 Family history of diabetes mellitus: Secondary | ICD-10-CM | POA: Diagnosis not present

## 2021-11-05 DIAGNOSIS — R1010 Upper abdominal pain, unspecified: Secondary | ICD-10-CM | POA: Diagnosis present

## 2021-11-05 DIAGNOSIS — K7031 Alcoholic cirrhosis of liver with ascites: Secondary | ICD-10-CM | POA: Diagnosis present

## 2021-11-05 DIAGNOSIS — A5901 Trichomonal vulvovaginitis: Secondary | ICD-10-CM | POA: Diagnosis present

## 2021-11-05 DIAGNOSIS — T378X5A Adverse effect of other specified systemic anti-infectives and antiparasitics, initial encounter: Secondary | ICD-10-CM | POA: Diagnosis not present

## 2021-11-05 DIAGNOSIS — I8500 Esophageal varices without bleeding: Secondary | ICD-10-CM

## 2021-11-05 DIAGNOSIS — I9589 Other hypotension: Secondary | ICD-10-CM | POA: Diagnosis not present

## 2021-11-05 DIAGNOSIS — Z888 Allergy status to other drugs, medicaments and biological substances status: Secondary | ICD-10-CM | POA: Diagnosis not present

## 2021-11-05 DIAGNOSIS — J918 Pleural effusion in other conditions classified elsewhere: Secondary | ICD-10-CM | POA: Diagnosis present

## 2021-11-05 DIAGNOSIS — K7469 Other cirrhosis of liver: Secondary | ICD-10-CM | POA: Diagnosis not present

## 2021-11-05 DIAGNOSIS — I959 Hypotension, unspecified: Secondary | ICD-10-CM | POA: Diagnosis present

## 2021-11-05 DIAGNOSIS — K7682 Hepatic encephalopathy: Secondary | ICD-10-CM | POA: Diagnosis present

## 2021-11-05 DIAGNOSIS — D649 Anemia, unspecified: Secondary | ICD-10-CM | POA: Diagnosis present

## 2021-11-05 DIAGNOSIS — K7030 Alcoholic cirrhosis of liver without ascites: Principal | ICD-10-CM | POA: Diagnosis present

## 2021-11-05 DIAGNOSIS — R0602 Shortness of breath: Secondary | ICD-10-CM

## 2021-11-05 DIAGNOSIS — K625 Hemorrhage of anus and rectum: Secondary | ICD-10-CM | POA: Diagnosis present

## 2021-11-05 DIAGNOSIS — R110 Nausea: Secondary | ICD-10-CM | POA: Diagnosis present

## 2021-11-05 DIAGNOSIS — T373X5A Adverse effect of other antiprotozoal drugs, initial encounter: Secondary | ICD-10-CM | POA: Diagnosis not present

## 2021-11-05 DIAGNOSIS — Z20822 Contact with and (suspected) exposure to covid-19: Secondary | ICD-10-CM | POA: Diagnosis present

## 2021-11-05 DIAGNOSIS — K7011 Alcoholic hepatitis with ascites: Secondary | ICD-10-CM

## 2021-11-05 DIAGNOSIS — E871 Hypo-osmolality and hyponatremia: Secondary | ICD-10-CM | POA: Diagnosis present

## 2021-11-05 DIAGNOSIS — Y92239 Unspecified place in hospital as the place of occurrence of the external cause: Secondary | ICD-10-CM | POA: Diagnosis not present

## 2021-11-05 DIAGNOSIS — D62 Acute posthemorrhagic anemia: Secondary | ICD-10-CM | POA: Diagnosis present

## 2021-11-05 DIAGNOSIS — E7220 Disorder of urea cycle metabolism, unspecified: Secondary | ICD-10-CM | POA: Diagnosis present

## 2021-11-05 DIAGNOSIS — Z9049 Acquired absence of other specified parts of digestive tract: Secondary | ICD-10-CM

## 2021-11-05 DIAGNOSIS — D689 Coagulation defect, unspecified: Secondary | ICD-10-CM | POA: Diagnosis present

## 2021-11-05 DIAGNOSIS — Z881 Allergy status to other antibiotic agents status: Secondary | ICD-10-CM | POA: Diagnosis not present

## 2021-11-05 DIAGNOSIS — Z79899 Other long term (current) drug therapy: Secondary | ICD-10-CM

## 2021-11-05 DIAGNOSIS — F1020 Alcohol dependence, uncomplicated: Secondary | ICD-10-CM | POA: Diagnosis present

## 2021-11-05 DIAGNOSIS — J948 Other specified pleural conditions: Secondary | ICD-10-CM | POA: Diagnosis present

## 2021-11-05 DIAGNOSIS — K766 Portal hypertension: Secondary | ICD-10-CM | POA: Diagnosis present

## 2021-11-05 DIAGNOSIS — E861 Hypovolemia: Secondary | ICD-10-CM | POA: Diagnosis not present

## 2021-11-05 DIAGNOSIS — J90 Pleural effusion, not elsewhere classified: Secondary | ICD-10-CM

## 2021-11-05 DIAGNOSIS — G8929 Other chronic pain: Secondary | ICD-10-CM | POA: Diagnosis present

## 2021-11-05 DIAGNOSIS — F419 Anxiety disorder, unspecified: Secondary | ICD-10-CM | POA: Diagnosis present

## 2021-11-05 DIAGNOSIS — E8770 Fluid overload, unspecified: Secondary | ICD-10-CM | POA: Diagnosis present

## 2021-11-05 LAB — CBC WITH DIFFERENTIAL/PLATELET
Abs Immature Granulocytes: 0.02 10*3/uL (ref 0.00–0.07)
Basophils Absolute: 0 10*3/uL (ref 0.0–0.1)
Basophils Relative: 1 %
Eosinophils Absolute: 0.2 10*3/uL (ref 0.0–0.5)
Eosinophils Relative: 3 %
HCT: 28.8 % — ABNORMAL LOW (ref 36.0–46.0)
Hemoglobin: 9 g/dL — ABNORMAL LOW (ref 12.0–15.0)
Immature Granulocytes: 0 %
Lymphocytes Relative: 18 %
Lymphs Abs: 0.9 10*3/uL (ref 0.7–4.0)
MCH: 29 pg (ref 26.0–34.0)
MCHC: 31.3 g/dL (ref 30.0–36.0)
MCV: 92.9 fL (ref 80.0–100.0)
Monocytes Absolute: 0.6 10*3/uL (ref 0.1–1.0)
Monocytes Relative: 11 %
Neutro Abs: 3.4 10*3/uL (ref 1.7–7.7)
Neutrophils Relative %: 67 %
Platelets: 38 10*3/uL — ABNORMAL LOW (ref 150–400)
RBC: 3.1 MIL/uL — ABNORMAL LOW (ref 3.87–5.11)
RDW: 22.5 % — ABNORMAL HIGH (ref 11.5–15.5)
WBC: 5.1 10*3/uL (ref 4.0–10.5)
nRBC: 0 % (ref 0.0–0.2)

## 2021-11-05 LAB — COMPREHENSIVE METABOLIC PANEL
ALT: 21 U/L (ref 0–44)
AST: 65 U/L — ABNORMAL HIGH (ref 15–41)
Albumin: 1.8 g/dL — ABNORMAL LOW (ref 3.5–5.0)
Alkaline Phosphatase: 150 U/L — ABNORMAL HIGH (ref 38–126)
Anion gap: 4 — ABNORMAL LOW (ref 5–15)
BUN: 8 mg/dL (ref 6–20)
CO2: 20 mmol/L — ABNORMAL LOW (ref 22–32)
Calcium: 7.4 mg/dL — ABNORMAL LOW (ref 8.9–10.3)
Chloride: 103 mmol/L (ref 98–111)
Creatinine, Ser: 0.8 mg/dL (ref 0.44–1.00)
GFR, Estimated: >60 mL/min (ref >60)
Glucose, Bld: 106 mg/dL — ABNORMAL HIGH (ref 70–99)
Potassium: 4.8 mmol/L (ref 3.5–5.1)
Sodium: 127 mmol/L — ABNORMAL LOW (ref 135–145)
Total Bilirubin: 14.4 mg/dL — ABNORMAL HIGH (ref 0.3–1.2)
Total Protein: 5.4 g/dL — ABNORMAL LOW (ref 6.5–8.1)

## 2021-11-05 LAB — AMMONIA: Ammonia: 49 umol/L — ABNORMAL HIGH (ref 9–35)

## 2021-11-05 LAB — LACTIC ACID, PLASMA
Lactic Acid, Venous: 0.9 mmol/L (ref 0.5–1.9)
Lactic Acid, Venous: 1.2 mmol/L (ref 0.5–1.9)

## 2021-11-05 LAB — I-STAT CHEM 8, ED
BUN: 7 mg/dL (ref 6–20)
Calcium, Ion: 1.02 mmol/L — ABNORMAL LOW (ref 1.15–1.40)
Chloride: 103 mmol/L (ref 98–111)
Creatinine, Ser: 0.8 mg/dL (ref 0.44–1.00)
Glucose, Bld: 82 mg/dL (ref 70–99)
HCT: 26 % — ABNORMAL LOW (ref 36.0–46.0)
Hemoglobin: 8.8 g/dL — ABNORMAL LOW (ref 12.0–15.0)
Potassium: 4.3 mmol/L (ref 3.5–5.1)
Sodium: 132 mmol/L — ABNORMAL LOW (ref 135–145)
TCO2: 20 mmol/L — ABNORMAL LOW (ref 22–32)

## 2021-11-05 LAB — URINALYSIS, ROUTINE W REFLEX MICROSCOPIC

## 2021-11-05 LAB — URINALYSIS, MICROSCOPIC (REFLEX)

## 2021-11-05 LAB — TROPONIN I (HIGH SENSITIVITY)
Troponin I (High Sensitivity): 6 ng/L (ref <18)
Troponin I (High Sensitivity): 4 ng/L (ref <18)

## 2021-11-05 LAB — PROTIME-INR
INR: 3.1 — ABNORMAL HIGH (ref 0.8–1.2)
Prothrombin Time: 31.7 seconds — ABNORMAL HIGH (ref 11.4–15.2)

## 2021-11-05 LAB — I-STAT BETA HCG BLOOD, ED (MC, WL, AP ONLY): I-stat hCG, quantitative: <5 m[IU]/mL (ref <5)

## 2021-11-05 LAB — ETHANOL: Alcohol, Ethyl (B): <10 mg/dL (ref <10)

## 2021-11-05 LAB — LIPASE, BLOOD: Lipase: 60 U/L — ABNORMAL HIGH (ref 11–51)

## 2021-11-05 MED ORDER — LACTATED RINGERS IV BOLUS
500.0000 mL | Freq: Once | INTRAVENOUS | Status: AC
  Administered 2021-11-05: 500 mL via INTRAVENOUS

## 2021-11-05 MED ORDER — IOHEXOL 350 MG/ML SOLN
80.0000 mL | Freq: Once | INTRAVENOUS | Status: AC | PRN
  Administered 2021-11-05: 80 mL via INTRAVENOUS

## 2021-11-05 MED ORDER — SODIUM CHLORIDE 0.9 % IV SOLN
8.0000 mg/kg | Freq: Every day | INTRAVENOUS | Status: DC
  Administered 2021-11-05: 700 mg via INTRAVENOUS
  Filled 2021-11-05 (×2): qty 14

## 2021-11-05 MED ORDER — SODIUM CHLORIDE 0.9 % IV BOLUS
500.0000 mL | Freq: Once | INTRAVENOUS | Status: AC
Start: 2021-11-05 — End: 2021-11-05
  Administered 2021-11-05: 500 mL via INTRAVENOUS

## 2021-11-05 MED ORDER — SODIUM CHLORIDE 0.9 % IV SOLN
2.0000 g | Freq: Three times a day (TID) | INTRAVENOUS | Status: DC
  Administered 2021-11-05 – 2021-11-06 (×3): 2 g via INTRAVENOUS
  Filled 2021-11-05 (×3): qty 2

## 2021-11-05 MED ORDER — SODIUM CHLORIDE 0.9 % IV BOLUS
500.0000 mL | Freq: Once | INTRAVENOUS | Status: AC
  Administered 2021-11-05: 500 mL via INTRAVENOUS

## 2021-11-05 MED ORDER — ALBUMIN HUMAN 25 % IV SOLN
12.5000 g | Freq: Once | INTRAVENOUS | Status: AC
  Administered 2021-11-05: 12.5 g via INTRAVENOUS
  Filled 2021-11-05: qty 50

## 2021-11-05 MED ORDER — LACTATED RINGERS IV BOLUS
1000.0000 mL | Freq: Once | INTRAVENOUS | Status: AC
  Administered 2021-11-05: 1000 mL via INTRAVENOUS

## 2021-11-05 MED ORDER — LINEZOLID 600 MG/300ML IV SOLN
600.0000 mg | Freq: Two times a day (BID) | INTRAVENOUS | Status: DC
  Filled 2021-11-05: qty 300

## 2021-11-05 NOTE — ED Notes (Signed)
Notified Dr. Jeanell Sparrow that pt's BP continue to trend in the 85/30 range. Dr. Jeanell Sparrow to order 500 NS bolus and albumin. Patient still mentating appropriately Aox4. All other VSS.

## 2021-11-05 NOTE — H&P (Signed)
History and Physical    Victoria Gordon PYP:[id number] DOB: Mar 09, 1981 DOA: 11/05/2021  PCP: Patient, [name]   Patient coming from: Home  Chief Complaint: RUQ abdominal pain, SOB  HPI: Victoria Gordon is a 41 y.o. female with medical history significant fAlcohol abuse, Alcoholic cirrhosis of liver with ascites, Anemia, Anxiety, Hemorrhoid, Hepatitis B, Major depressive disorder, PTSD, and Substance abuse who was discharged from [facility] two days ago after having thoracentesis for pleural effusion.  During her hospitalization she received 1 unit of packed red blood cells for anemia with hemoglobin of 6.9.  Today hemoglobin is 0.8.  This morning she woke up and had epigastric right upper quadrant abdominal pain as well as pain in her right lateral chest wall with increased shortness of breath.  She also had nausea she reports having black tarry stools for the last few days.  She reports she vomited once at home this morning.  She states she has not had any chest pain.  She does admit to having a occasional periods of confusion which she attributes to her liver disease.  She continues on her medications that were prescribed to her when she left the hospital.  She has not followed up with any outpatient physician and does not have a PCP.  She states she has not had any fever or chills and has not had no known sick contacts.  Has had decreased appetite the last 2 days.  She has jaundice of her skin which is unchanged for the past few weeks she states.  She describes the pain in her abdomen and side is an aching pain that does not radiate.  He has not had any falls or injury to her abdomen chest or back.  She denies any urinary symptoms.  ED Course: In the emergency room he has had soft blood pressure in the 80-100/43-56 range, pulse 50-60 respirations 15-27.  She has been afebrile.  She was started on cefepime and daptomycin for possible early sepsis with bradycardia and  low blood pressures below 90 systolically..  She does have history of bacteremia.  There is risk of spontaneous bacterial peritonitis although this is less likely as abdominal pain is not severe to palpation.  She was started on empiric antibiotics which is prudent.  Lactic acid level 0.9 troponin negative at 6 and 4.  Lipase 60.  Sodium 127 potassium 4.8 chloride 103 bicarb 20 creatinine 0.80 BUN 8 calcium 7.4 alkaline phosphatase 150 AST 65 ALT 21 albumin 1.8 bilirubin 14.4 ammonia 49 glucose 106.  WBC 5100 hemoglobin 9 hematocrit 20.8 8 with 38,000.  Patient was given albumin infusion in the emergency room.  Hospital service was asked to admit for further management  Review of Systems:  General: Denies fever, chills, weight loss, night sweats. Reports decrease in appetite HENT: Denies head trauma, headache, denies change in hearing, tinnitus.  Denies nasal bleeding.  Denies sore throat.  Denies difficulty swallowing Eyes: Denies blurry vision, pain in eye, drainage.  Denies discoloration of eyes. Neck: Denies pain.  Denies swelling.  Denies pain with movement. Cardiovascular: Denies chest pain, palpitations.  Denies edema.  Denies orthopnea Respiratory: Reports shortness of breath. Denies cough. Denies wheezing.  Denies sputum production Gastrointestinal: reports abdominal pain, swelling. Reports nausea, vomiting. Denies diarrhea.  Denies melena.  Denies hematemesis. Musculoskeletal: Denies limitation of movement.  Denies deformity or swelling.  Denies arthralgias or myalgias. Genitourinary: Denies pelvic pain.  Denies urinary frequency or hesitancy.  Denies dysuria.  Skin:  Denies rash.  Denies petechiae, purpura, ecchymosis. Neurological: Denies syncope. Denies seizure activity. Denies paresthesia. Denies slurred speech, drooping face.  Denies visual change. Psychiatric: Denies depression, anxiety. Denies hallucinations.  Past Medical History:  Diagnosis Date   Alcohol abuse    Alcoholic  cirrhosis of liver with ascites ([place])    Anemia    Anxiety    Hemorrhoid    Hepatitis B    Major depressive disorder    PTSD (post-traumatic stress disorder)    Substance abuse [facility])     Past Surgical History:  Procedure Laterality Date   BIOPSY  08/29/2021   Procedure: BIOPSY;  Surgeon: Daryel November, MD;  Location: [place];  Service: Gastroenterology;;   CESAREAN SECTION     CHOLECYSTECTOMY     COLONOSCOPY WITH PROPOFOL N/A 08/29/2021   Procedure: COLONOSCOPY WITH PROPOFOL;  Surgeon: Daryel November, MD;  Location: [facility] ENDOSCOPY;  Service: Gastroenterology;  Laterality: N/A;   ESOPHAGOGASTRODUODENOSCOPY (EGD) WITH PROPOFOL N/A 08/29/2021   Procedure: ESOPHAGOGASTRODUODENOSCOPY (EGD) WITH PROPOFOL;  Surgeon: Daryel November, MD;  Location: [place];  Service: Gastroenterology;  Laterality: N/A;   MOUTH SURGERY     TUBAL LIGATION Bilateral 07/22/2014   Procedure: POST PARTUM TUBAL LIGATION;  Surgeon: Woodroe Mode, MD;  Location: [facility];  Service: Gynecology;  Laterality: Bilateral;    Social History  reports that she has never smoked. She has never used smokeless tobacco. She reports that she does not currently use alcohol. She reports that she does not use drugs.  Allergies  Allergen Reactions   Vitamin K And Related Hives, Shortness Of Breath, Itching and Rash    Tolerated on 08/21/21 without reaction   Vancomycin Hives and Itching    Family History  Problem Relation Age of Onset   Diabetes Maternal Grandmother    Diabetes Paternal Grandfather      Prior to Admission medications   Medication Sig Start Date End Date Taking? Authorizing Provider  ciprofloxacin (CIPRO) 500 MG tablet Take 1 tablet (500 mg total) by mouth daily. 09/27/21  Yes Daryel November, MD  folic acid (FOLVITE) 1 MG tablet Take 1 tablet (1 mg total) by mouth daily. 08/30/21  Yes Nita Sells, MD  furosemide (LASIX) 40 MG tablet Take 1 tablet (40 mg total) by mouth 2  (two) times daily. 08/30/21  Yes Nita Sells, MD  pantoprazole (PROTONIX) 40 MG tablet Take 1 tablet (40 mg total) by mouth daily. 08/30/21  Yes Nita Sells, MD  propranolol (INDERAL) 20 MG tablet Take 1 tablet (20 mg total) by mouth 3 (three) times daily. 09/27/21  Yes Daryel November, MD  spironolactone (ALDACTONE) 100 MG tablet Take 1 tablet (100 mg total) by mouth 2 (two) times daily. 08/30/21  Yes Nita Sells, MD  hydrocortisone (ANUSOL-HC) 25 MG suppository Place 1 suppository (25 mg total) rectally 2 (two) times daily. Patient not taking: Reported on 11/05/2021 11/03/21   Jennye Boroughs, MD  lactulose (CHRONULAC) 10 GM/15ML solution Take 30 mLs (20 g total) by mouth 2 (two) times daily. 09/27/21   Daryel November, MD  rifaximin (XIFAXAN) 550 MG TABS tablet Take 1 tablet (550 mg total) by mouth 2 (two) times daily. Patient not taking: Reported on 11/05/2021 09/27/21   Daryel November, MD    Physical Exam: Vitals:   11/05/21 1805 11/05/21 1845 11/05/21 1945 11/05/21 2000  BP: (!) 84/47 (!) 93/58 (!) 82/52 (!) 89/47  Pulse: (!) 58 (!) 56 (!) 54 (!) 55  Resp: 20  17 13 (!) 27  Temp:      TempSrc:      SpO2: 100% 100% 100% 100%    Constitutional: NAD, calm, comfortable Vitals:   11/05/21 1805 11/05/21 1845 11/05/21 1945 11/05/21 2000  BP: (!) 84/47 (!) 93/58 (!) 82/52 (!) 89/47  Pulse: (!) 58 (!) 56 (!) 54 (!) 55  Resp: 20 17 13  (!) 27  Temp:      TempSrc:      SpO2: 100% 100% 100% 100%   General: WDWN, Alert and oriented x3.  Eyes: EOMI, PERRL, conjunctivae without erythema or drainage.  Sclera icteric HENT:  [place]/AT, external ears normal.  Nares patent without epistasis.  Mucous membranes are moist.  Neck: Soft, normal range of motion, supple, no masses, Trachea midline Respiratory: clear to auscultation bilaterally, no wheezing, no crackles. Normal respiratory effort. No accessory muscle use.  Cardiovascular: Regular rate and rhythm, no murmurs  / rubs / gallops. Mild lower extremity edema. 2+ pedal pulses. Abdomen: Soft, mild tenderness, distended, no rebound or guarding. Bowel sounds hypoactive Musculoskeletal: FROM. no cyanosis. No joint deformity upper and lower extremities. Normal muscle tone.  Skin: Warm, dry, intact no rashes, lesions, ulcers. No induration.  Jaundice of face, chest and abdomen. Neurologic: CN 2-12 grossly intact.  Normal speech.  Sensation intact   Psychiatric: Normal judgment and insight. Normal mood.    Labs on Admission: I have personally reviewed following labs and imaging studies  CBC: Recent Labs  Lab 11/01/21 1331 11/02/21 0523 11/03/21 0611 11/03/21 1356 11/05/21 1314 11/05/21 1727  WBC 4.8 3.0* 3.7*  --  5.1  --   NEUTROABS  --   --   --   --  3.4  --   HGB 6.9* 6.2* 7.7* 8.4* 9.0* 8.8*  HCT 22.1* 20.2* 24.6* 26.1* 28.8* 26.0*  MCV 100.5* 100.0 90.1  --  92.9  --   PLT 52* 35* 30*  --  38*  --     Basic Metabolic Panel: Recent Labs  Lab 11/01/21 1331 11/02/21 0523 11/03/21 0611 11/05/21 1314 11/05/21 1727  NA 132* 133* 132* 127* 132*  K 4.1 3.9 3.5 4.8 4.3  CL 106 108 106 103 103  CO2 20* 18* 20* 20*  --   GLUCOSE 128* 83 99 106* 82  BUN 13 13 12 8 7   CREATININE 0.81 0.69 0.71 0.80 0.80  CALCIUM 7.5* 7.5* 7.4* 7.4*  --     GFR: Estimated Creatinine Clearance: 96.6 mL/min (by C-G formula based on SCr of 0.8 mg/dL).  Liver Function Tests: Recent Labs  Lab 11/01/21 1331 11/02/21 0523 11/03/21 0611 11/05/21 1314  AST 51* 39 41 65*  ALT 23 20 22 21   ALKPHOS 123 110 106 150*  BILITOT 14.4* 11.2* 11.9* 14.4*  PROT 5.6* 4.5* 4.6* 5.4*  ALBUMIN 2.0* 1.6* 1.7* 1.8*    Urine analysis:    Component Value Date/Time   COLORURINE AMBER (A) 11/01/2021 1828   APPEARANCEUR CLOUDY (A) 11/01/2021 1828   LABSPEC 1.024 11/01/2021 1828   PHURINE 5.0 11/01/2021 1828   GLUCOSEU 50 (A) 11/01/2021 1828   HGBUR NEGATIVE 11/01/2021 1828   BILIRUBINUR MODERATE (A) 11/01/2021 1828    KETONESUR NEGATIVE 11/01/2021 1828   PROTEINUR 30 (A) 11/01/2021 1828   UROBILINOGEN 0.2 02/17/2014 1509   NITRITE NEGATIVE 11/01/2021 1828   LEUKOCYTESUR MODERATE (A) 11/01/2021 1828    Radiological Exams on Admission: DG Chest 2 View  Result Date: 11/05/2021 CLINICAL DATA:  Chest pain, shortness of breath. EXAM:  CHEST - 2 VIEW COMPARISON:  November 02, 2021. FINDINGS: The heart size and mediastinal contours are within normal limits. Left lung is clear. Increased right pleural effusion is noted with associated right basilar atelectasis or infiltrate. The visualized skeletal structures are unremarkable. IMPRESSION: Increased right pleural effusion is noted with associated right basilar atelectasis or infiltrate. Electronically Signed   By: Marijo Conception M.D.   On: 11/05/2021 13:39   CT CHEST ABDOMEN PELVIS W CONTRAST  Result Date: 11/05/2021 CLINICAL DATA:  Worsened right back pain, swelling, recent thoracentesis, thrombocytopenia, abdominal pain EXAM: CT CHEST, ABDOMEN, AND PELVIS WITH CONTRAST TECHNIQUE: Multidetector CT imaging of the chest, abdomen and pelvis was performed following the standard protocol during bolus administration of intravenous contrast. CONTRAST:  58mL OMNIPAQUE IOHEXOL 350 MG/ML SOLN COMPARISON:  08/20/2021, 11/05/2021 FINDINGS: CT CHEST FINDINGS Cardiovascular: The heart and great vessels are unremarkable without pericardial effusion. No evidence of thoracic aortic aneurysm or dissection. Mediastinum/Nodes: No enlarged mediastinal, hilar, or axillary lymph nodes. Thyroid gland, trachea, and esophagus demonstrate no significant findings. Large gastric and esophageal varices are noted. Lungs/Pleura: Large right pleural effusion volume estimated approximately 2 L. compressive atelectasis within the dependent right lung. Left chest is clear. No pneumothorax. Musculoskeletal: There are no acute or destructive bony lesions. Subcutaneous edema within the lower back and bilateral  flanks. Reconstructed images demonstrate no additional findings. CT ABDOMEN PELVIS FINDINGS Hepatobiliary: Nodular shrunken appearance of the liver consistent with cirrhosis. No focal liver abnormality on this single phase exam. Gallbladder is surgically absent. Pancreas: Unremarkable. No pancreatic ductal dilatation or surrounding inflammatory changes. Spleen: The spleen is enlarged, measuring approximately 15.6 cm in craniocaudal length. No focal parenchymal abnormalities. Adrenals/Urinary Tract: Adrenal glands are unremarkable. Kidneys are normal, without renal calculi, focal lesion, or hydronephrosis. Bladder is unremarkable. Stomach/Bowel: No bowel obstruction or ileus. Normal gas-filled appendix right lower quadrant. No bowel wall thickening or inflammatory change. 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 Splenic vein, portal vein, and SMV are patent. No other significant vascular findings. No pathologic adenopathy. Reproductive: Uterus and bilateral adnexa are unremarkable. Other: Small volume ascites. No free intraperitoneal gas. There is diffuse body wall edema. No abdominal wall hernia. Musculoskeletal: No acute or destructive bony lesions. Reconstructed images demonstrate no additional findings. IMPRESSION: 1. Large right pleural effusion volume estimated 2 L. No complication after reported right thoracentesis. 2. Cirrhosis, with portal venous hypertension manifested by large gastric/esophageal varices, splenomegaly, and small volume ascites. 3. Diffuse body wall edema. Electronically Signed   By: Randa Ngo M.D.   On: 11/05/2021 18:10    EKG: Independently reviewed.  EKG shows normal sinus rhythm with no acute ST elevation or depression.  QTc 454  Assessment/Plan Principal Problem:   Pleural effusion associated with hepatic disorder Victoria Gordon is admitted to progressive care unit with increasing pleural effusion with symptoms of SOB.   Consult IR in am for thoracentesis Continue Lasix.  Active Problems:   Alcoholic cirrhosis of liver with ascites  Continue lactulose, spironolactone, Lasix. Has received albumin in the emergency room.  We will recheck albumin level with labs in the morning. Recheck LFTs in the morning.  Monitor electrolytes and renal function.     SIRS Patient would decreased blood pressure readings.  Concern will be for early sepsis with bacteremia due to her history of bacteremia and significant cirrhosis and liver failure. And started on cefepime and daptomycin the emergency room which will be continued per pharmacy consult.    Hyperammonemia  To mildly elevated.  Continue lactulose.    Hypotension Patient was given 2 small boluses of normal saline in the emergency room.  We will give a small bolus of LR as it has been more oncotic pressure and keeps fluid intravascularly more than normal saline. Patient's blood pressures been soft but her map has been normal.  Monitor blood pressure closely. Resume propranolol when blood pressure improves    Thrombocytopenia  Hold anticoagulation.  SCDs for DVT prophylaxis    Hyperbilirubinemia Chronic.  Secondary to cirrhosis.    Anemia Stable.  Monitor CBC in morning  DVT prophylaxis: SCDs for DVT prophylaxis.  No anticoagulation with thrombocytopenia Code Status:   Full code Family Communication:  Diagnosis and plan discussed with patient.  She verbalized understanding agrees to plan.  Further recommendation follow as clinical indicated Disposition Plan:   Patient is from:  Home              Anticipated discharge to:        To be determined Home with home health versus rehab anticipated DC date:  Anticipate 2 midnight or more stay in the hospital  Time spent on admission: 75 minutes  Consults called:  IR consulted for thoracentesis of pleural effusion  Admission status:  Inpatient  Yevonne Aline Wenceslao Loper MD [facility]  How to contact the [facility]  Attending or Consulting provider [place] or covering provider during after hours [place], for this patient?   Check the care team in [facility] and look for a) attending/consulting [facility] provider listed and b) the [facility] team listed Log into [email or web address] and use [place]'s universal password to access. If you do not have the password, please contact the hospital operator. Locate the [facility] provider you are looking for under [facility] and page to a number that you can be directly reached. If you still have difficulty reaching the provider, please page the [facility] (Director on Call) for the Hospitalists listed on amion for assistance.  11/05/2021, 8:30 PM

## 2021-11-05 NOTE — ED Notes (Signed)
RN aware of Pt BP

## 2021-11-05 NOTE — ED Provider Triage Note (Signed)
Emergency Medicine Provider Triage Evaluation Note  Victoria Gordon , a 41 y.o. female  was evaluated in triage.  Pt complains of abdominal pain.  Patient states that she has liver cirrhosis from alcohol abuse and has been having fluid buildup on her abdomen.  She states that she recently had paracentesis on Monday at [facility] with 1.5 L removed.  She states that she has had refilling as well as increasing shortness of breath.  She denies fevers.  She states she has only had paracentesis 3 times since August of last year.  States she is no longer drinking.  Review of Systems  Positive: See above Negative:   Physical Exam  BP (!) 101/53    Pulse 61    Temp 98.2 F (36.8 C) (Oral)    Resp 18    LMP 10/19/2021 (Approximate)    SpO2 100%  Gen:   Awake, no distress, alert and oriented Resp:  Normal effort  MSK:   Moves extremities without difficulty  Other:  Scleral icterus, jaundiced.  She does have a protuberant abdomen that is soft and tender to palpation.  Medical Decision Making  Medically screening exam initiated at 1:03 PM.  Appropriate orders placed.  Uri Covey was informed that the remainder of the evaluation will be completed by another provider, this initial triage assessment does not replace that evaluation, and the importance of remaining in the ED until their evaluation is complete.     Mickie Hillier, PA-C 11/05/21 1305

## 2021-11-05 NOTE — Progress Notes (Addendum)
Pharmacy Antibiotic Note  Victoria Gordon is a 41 y.o. female admitted on 11/05/2021 with concern for sepsis. PMH significant for alcoholic liver cirrhosis. Pharmacy has been consulted MRSA coverage and cefepime dosing. Patient with documented allergy (hives) to vancomycin from prior admission which required daptomycin use. Patient currently thrombocytopenic with platelets in the 30's, will defer on linezolid.   WBC is within normal limits, patient remains afebrile. Patient hypotensive with systolic blood pressures in the 80's.   Plan: Start cefepime IV 2g q 8h Start daptomycin 700 mg (8 mg/kg) q 24 h Monitor for signs of clinical improvement Follow-up blood cultures   Temp (24hrs), Avg:98.2 F (36.8 C), Min:98.2 F (36.8 C), Max:98.2 F (36.8 C)  Recent Labs  Lab 11/01/21 1331 11/02/21 0523 11/03/21 0611 11/05/21 1314 11/05/21 1727  WBC 4.8 3.0* 3.7* 5.1  --   CREATININE 0.81 0.69 0.71 0.80 0.80    Estimated Creatinine Clearance: 96.6 mL/min (by C-G formula based on SCr of 0.8 mg/dL).    Allergies  Allergen Reactions   Vitamin K And Related Hives, Shortness Of Breath, Itching and Rash    Tolerated on 08/21/21 without reaction   Vancomycin Hives and Itching    Antimicrobials this admission: 1/6 cefepime >>  1/6 daptomycin >>   Dose adjustments this admission: none  Microbiology results: 1/6 BCx: pending  Thank you for involving pharmacy in this patient's care.  Elita Quick, PharmD PGY1 Ambulatory Care Pharmacy Resident 11/05/2021 5:55 PM  **Pharmacist phone directory can be found on [email or web address] listed under [place]**

## 2021-11-05 NOTE — ED Notes (Signed)
Notified Dr. Jeanell Sparrow of pt's BP 73/37, 85/51. 500 ml NS bolus infusing. No new orders received at this time.

## 2021-11-05 NOTE — ED Provider Notes (Signed)
[facility] EMERGENCY DEPARTMENT Provider Note   CSN: [id number] Arrival date & time: 11/05/21  1138     History  Chief Complaint  Patient presents with   Abdominal Pain    Victoria Gordon is a 41 y.o. female.  The history is provided by the patient and medical records. No language interpreter was used.  Abdominal Pain Pain location:  RUQ and epigastric Pain quality: aching   Pain radiates to:  Back Pain severity:  Moderate Timing:  Constant Relieved by:  Nothing Worsened by:  Nothing Associated symptoms: fatigue, melena, nausea, shortness of breath and vomiting   Associated symptoms: no chest pain, no chills, no constipation, no cough, no diarrhea and no dysuria       Home Medications Prior to Admission medications   Medication Sig Start Date End Date Taking? Authorizing Provider  ciprofloxacin (CIPRO) 500 MG tablet Take 1 tablet (500 mg total) by mouth daily. 09/27/21   Daryel November, MD  folic acid (FOLVITE) 1 MG tablet Take 1 tablet (1 mg total) by mouth daily. 08/30/21   Nita Sells, MD  furosemide (LASIX) 40 MG tablet Take 1 tablet (40 mg total) by mouth 2 (two) times daily. 08/30/21   Nita Sells, MD  hydrocortisone (ANUSOL-HC) 25 MG suppository Place 1 suppository (25 mg total) rectally 2 (two) times daily. 11/03/21   Jennye Boroughs, MD  lactulose (CHRONULAC) 10 GM/15ML solution Take 30 mLs (20 g total) by mouth 2 (two) times daily. 09/27/21   Daryel November, MD  pantoprazole (PROTONIX) 40 MG tablet Take 1 tablet (40 mg total) by mouth daily. 08/30/21   Nita Sells, MD  propranolol (INDERAL) 20 MG tablet Take 1 tablet (20 mg total) by mouth 3 (three) times daily. 09/27/21   Daryel November, MD  rifaximin (XIFAXAN) 550 MG TABS tablet Take 1 tablet (550 mg total) by mouth 2 (two) times daily. 09/27/21   Daryel November, MD  spironolactone (ALDACTONE) 100 MG tablet Take 1 tablet (100 mg total) by mouth 2  (two) times daily. 08/30/21   Nita Sells, MD      Allergies    Vitamin k and related and Vancomycin    Review of Systems   Review of Systems  Constitutional:  Positive for fatigue. Negative for chills.  HENT:  Negative for congestion.   Respiratory:  Positive for shortness of breath. Negative for cough, chest tightness and wheezing.   Cardiovascular:  Negative for chest pain, palpitations and leg swelling.  Gastrointestinal:  Positive for abdominal pain, melena, nausea and vomiting. Negative for constipation and diarrhea.  Genitourinary:  Negative for dysuria and flank pain.  Musculoskeletal:  Positive for back pain. Negative for neck pain and neck stiffness.  Skin:  Negative for rash and wound.  Neurological:  Positive for light-headedness. Negative for headaches.  Psychiatric/Behavioral:  Negative for agitation.   All other systems reviewed and are negative.  Physical Exam Updated Vital Signs BP (!) 101/53    Pulse 61    Temp 98.2 F (36.8 C) (Oral)    Resp 18    LMP 10/19/2021 (Approximate)    SpO2 100%  Physical Exam Vitals and nursing note reviewed.  Constitutional:      General: She is not in acute distress.    Appearance: She is well-developed. She is ill-appearing. She is not toxic-appearing or diaphoretic.  HENT:     Head: Normocephalic and atraumatic.     Mouth/Throat:     Mouth: Mucous membranes are  moist.  Eyes:     General: Scleral icterus present.     Extraocular Movements: Extraocular movements intact.     Conjunctiva/sclera: Conjunctivae normal.  Cardiovascular:     Rate and Rhythm: Normal rate and regular rhythm.     Heart sounds: No murmur heard. Pulmonary:     Effort: Pulmonary effort is normal. No respiratory distress.     Breath sounds: Rhonchi and rales present. No wheezing.  Chest:     Chest wall: No tenderness.  Abdominal:     General: Abdomen is flat. Bowel sounds are normal. There is no distension.     Palpations: Abdomen is soft.      Tenderness: There is abdominal tenderness in the right upper quadrant and epigastric area. There is right CVA tenderness.  Musculoskeletal:        General: No swelling.     Cervical back: Neck supple.  Skin:    General: Skin is warm and dry.     Capillary Refill: Capillary refill takes less than 2 seconds.  Neurological:     General: No focal deficit present.     Mental Status: She is alert.  Psychiatric:        Mood and Affect: Mood normal.    ED Results / Procedures / Treatments   Labs (all labs ordered are listed, but only abnormal results are displayed) Labs Reviewed  COMPREHENSIVE METABOLIC PANEL - Abnormal; Notable for the following components:      Result Value   Sodium 127 (*)    CO2 20 (*)    Glucose, Bld 106 (*)    Calcium 7.4 (*)    Total Protein 5.4 (*)    Albumin 1.8 (*)    AST 65 (*)    Alkaline Phosphatase 150 (*)    Total Bilirubin 14.4 (*)    Anion gap 4 (*)    All other components within normal limits  CBC WITH DIFFERENTIAL/PLATELET - Abnormal; Notable for the following components:   RBC 3.10 (*)    Hemoglobin 9.0 (*)    HCT 28.8 (*)    RDW 22.5 (*)    Platelets 38 (*)    All other components within normal limits  PROTIME-INR - Abnormal; Notable for the following components:   Prothrombin Time 31.7 (*)    INR 3.1 (*)    All other components within normal limits  AMMONIA - Abnormal; Notable for the following components:   Ammonia 49 (*)    All other components within normal limits  URINE CULTURE  URINALYSIS, ROUTINE W REFLEX MICROSCOPIC  LIPASE, BLOOD  ETHANOL  I-STAT BETA HCG BLOOD, ED ([facility], [facility], [facility] ONLY)  TYPE AND SCREEN  TROPONIN I (HIGH SENSITIVITY)  TROPONIN I (HIGH SENSITIVITY)    EKG EKG Interpretation  Date/Time:  Friday November 05 2021 12:38:41 EST Ventricular Rate:  60 PR Interval:  172 QRS Duration: 98 QT Interval:  454 QTC Calculation: 454 R Axis:   -28 Text Interpretation: Normal sinus rhythm Possible Anterolateral infarct  , age undetermined Abnormal ECG When compared with ECG of 01-Nov-2021 13:24, PREVIOUS ECG IS PRESENT When compared to prior, similar appearance. No STEMI Confirmed by Antony Blackbird [phone]) on 11/05/2021 1:47:22 PM  Radiology DG Chest 2 View  Result Date: 11/05/2021 CLINICAL DATA:  Chest pain, shortness of breath. EXAM: CHEST - 2 VIEW COMPARISON:  November 02, 2021. FINDINGS: The heart size and mediastinal contours are within normal limits. Left lung is clear. Increased right pleural effusion is noted with associated right  basilar atelectasis or infiltrate. The visualized skeletal structures are unremarkable. IMPRESSION: Increased right pleural effusion is noted with associated right basilar atelectasis or infiltrate. Electronically Signed   By: Marijo Conception M.D.   On: 11/05/2021 13:39    Procedures Procedures    Medications Ordered in ED Medications  sodium chloride 0.9 % bolus 500 mL (500 mLs Intravenous New Bag/Given 11/05/21 1535)    ED Course/ Medical Decision Making/ A&P                           Medical Decision Making   Siddhi Ciria Bernardini is a 41 y.o. female with a past medical history significant for alcoholic liver disease, GI bleeding, recent admission for symptomatic anemia and symptomatic pleural effusion requiring thoracentesis discharged 2 days ago who presents with worsening fatigue, sleepiness, shortness of breath, right back and flank pain, nausea, vomiting, darkened urine, and abdominal discomfort.  According to patient, she was discharged in the hospital 2 days ago after needing to get blood for anemia from chronic GI bleeding.  She also had to have 1/2 L of fluid taken off her right chest via thoracentesis which was contributing to shortness of breath.  She says that after leaving, she is getting more more winded when she tries to walk around and is having more discomfort in her right back and her right upper quadrant of the abdomen.  She denies diffuse abdominal pain  however.  She is reporting no fevers, chills, congestion, or cough but is having difficulty breathing at times.  She says she is looking more jaundiced again and her urine has been darker and smelling different.  On exam, lungs are diminished on the right base and have some rales and faint rhonchi.  Chest was nontender.  Abdomen is tender in the epigastric area and right upper quadrant and right flank.  Right CVA and back is very tender to palpation and slightly more swollen than the left side.  No crepitance however.  Bowel sounds were appreciated.  No focal neurologic deficits initially.  Patient is jaundiced and has scleral icterus.  Patient's vital signs on arrival show soft pressure around 194 systolic.  She is not hypoxic or tachycardic.  Clinically I am concerned that patient is developing recurrent shortness of breath from a symptomatic pleural effusion versus a problem or complication from the recent thoracentesis as she is having the tenderness swelling and pain at the thoracentesis site.  Given her low platelets, patient could have bleeding in a subcutaneous or retroperitoneal space causing her discomfort.  Her abdomen is not remarkably distended but due to the discomfort wrapping around, we will get CT imaging of her chest/abdomen/pelvis to look for bleeding or other acute abnormalities.  Blood pressures appear to soft for significant pain medicine at this time however we will try to give her pain medicine when we can.  Patient had some work-up started including some labs.  Metabolic panel shows worsening of AST alk phos and bilirubin now with a bilirubin up to 14.4.  CBC shows no leukocytosis and hemoglobin has improved up to 9.  Platelets are still low at 38.  Ammonia slightly elevated at 49.  X-ray shows recurrent and worsening right-sided pleural effusion versus infiltrate.  Anticipate reassessment after CT imaging and urine to determine disposition.  Due to worsening symptoms I do  anticipate she will likely need admission for repeat thoracentesis and monitoring.  Final Clinical Impression(s) / ED Diagnoses Final diagnoses:  Shortness of breath     Clinical Impression: 1. Shortness of breath     Disposition: Care transferred to oncoming team awaiting for work-up results to be completed.  Anticipate admission.  This note was prepared with assistance of Systems analyst. Occasional wrong-word or sound-a-like substitutions may have occurred due to the inherent limitations of voice recognition software.      Arion Shankles, Gwenyth Allegra, MD 11/05/21 [phone]

## 2021-11-05 NOTE — ED Notes (Signed)
Patient transported to CT 

## 2021-11-05 NOTE — ED Triage Notes (Signed)
Pt here POV with c/o SOB, abdominal pain and fatigue. Pt seen Forestine Na Monday. Pt reports 1.5L fluid removed. Pt states it has returned. Pt rates pain 7/10. Jaundice severe

## 2021-11-05 NOTE — ED Provider Notes (Signed)
°  Physical Exam  BP (!) 87/50    Pulse (!) 59    Temp 98.2 F (36.8 C) (Oral)    Resp (!) 26    LMP 10/19/2021 (Approximate)    SpO2 98%   Physical Exam  Procedures  Procedures  ED Course / MDM    Medical Decision Making  41 year old female history of hepatic cirrhosis, liver failure, recent admission for 2 any pain with anemia and new pleural effusion which was tapped there.  Presents here today with complaints of Worsening pain in the area where cath was done. Work-up here significant for worsening pleural effusion Hemoglobin is stable Given pain at site there is also concern for bleeding given patient's low platelets however her hemoglobin is stable CT pending Patient now hypotensive with blood pressures ranging from 74-90.  500 cc of fluid given.  Current blood pressures 87/50 Plan blood cultures, lactic, will reassess after 500 cc bolus Discussed with Dr. Havery Moros, on-call for gastroenterology Discussed with Vedia Coffer , pharmacist.  She will dose broad-spectrum antibiotics with new hypotension, concern for infection pending lactic, and albumin. Discussed with Dr. Tonie Griffith who will see for admission     Pattricia Boss, MD 11/07/21 2151

## 2021-11-06 ENCOUNTER — Inpatient Hospital Stay (HOSPITAL_COMMUNITY): Payer: Medicaid Other

## 2021-11-06 DIAGNOSIS — K769 Liver disease, unspecified: Secondary | ICD-10-CM | POA: Diagnosis not present

## 2021-11-06 DIAGNOSIS — J918 Pleural effusion in other conditions classified elsewhere: Secondary | ICD-10-CM

## 2021-11-06 DIAGNOSIS — D696 Thrombocytopenia, unspecified: Secondary | ICD-10-CM

## 2021-11-06 DIAGNOSIS — K7031 Alcoholic cirrhosis of liver with ascites: Secondary | ICD-10-CM | POA: Diagnosis not present

## 2021-11-06 DIAGNOSIS — D649 Anemia, unspecified: Secondary | ICD-10-CM

## 2021-11-06 LAB — TYPE AND SCREEN
ABO/RH(D): O POS
Antibody Screen: POSITIVE
Donor AG Type: NEGATIVE
Donor AG Type: NEGATIVE
Donor AG Type: NEGATIVE
Donor AG Type: NEGATIVE
Unit division: 0
Unit division: 0
Unit division: 0
Unit division: 0

## 2021-11-06 LAB — BODY FLUID CELL COUNT WITH DIFFERENTIAL
Lymphs, Fluid: 68 %
Monocyte-Macrophage-Serous Fluid: 17 % — ABNORMAL LOW (ref 50–90)
Neutrophil Count, Fluid: 15 % (ref 0–25)
Total Nucleated Cell Count, Fluid: 115 cu mm (ref 0–1000)

## 2021-11-06 LAB — CBC WITH DIFFERENTIAL/PLATELET
Abs Immature Granulocytes: 0.01 10*3/uL (ref 0.00–0.07)
Basophils Absolute: 0 10*3/uL (ref 0.0–0.1)
Basophils Relative: 1 %
Eosinophils Absolute: 0.1 10*3/uL (ref 0.0–0.5)
Eosinophils Relative: 3 %
HCT: 23.5 % — ABNORMAL LOW (ref 36.0–46.0)
Hemoglobin: 7.2 g/dL — ABNORMAL LOW (ref 12.0–15.0)
Immature Granulocytes: 0 %
Lymphocytes Relative: 23 %
Lymphs Abs: 0.7 10*3/uL (ref 0.7–4.0)
MCH: 28.1 pg (ref 26.0–34.0)
MCHC: 30.6 g/dL (ref 30.0–36.0)
MCV: 91.8 fL (ref 80.0–100.0)
Monocytes Absolute: 0.3 10*3/uL (ref 0.1–1.0)
Monocytes Relative: 8 %
Neutro Abs: 1.9 10*3/uL (ref 1.7–7.7)
Neutrophils Relative %: 65 %
Platelets: 31 10*3/uL — ABNORMAL LOW (ref 150–400)
RBC: 2.56 MIL/uL — ABNORMAL LOW (ref 3.87–5.11)
RDW: 22.8 % — ABNORMAL HIGH (ref 11.5–15.5)
WBC: 3 10*3/uL — ABNORMAL LOW (ref 4.0–10.5)
nRBC: 0 % (ref 0.0–0.2)

## 2021-11-06 LAB — BPAM RBC
Blood Product Expiration Date: 202301112359
Blood Product Expiration Date: 202301142359
Blood Product Expiration Date: 202302082359
Blood Product Expiration Date: 202302082359
ISSUE DATE / TIME: 202301022019
ISSUE DATE / TIME: 202301031603
ISSUE DATE / TIME: 202301031852
Unit Type and Rh: 5100
Unit Type and Rh: 5100
Unit Type and Rh: 5100
Unit Type and Rh: 5100

## 2021-11-06 LAB — LACTATE DEHYDROGENASE, PLEURAL OR PERITONEAL FLUID: LD, Fluid: 21 U/L (ref 3–23)

## 2021-11-06 LAB — GRAM STAIN

## 2021-11-06 LAB — AMYLASE, PLEURAL OR PERITONEAL FLUID: Amylase, Fluid: 21 U/L

## 2021-11-06 LAB — ALBUMIN, PLEURAL OR PERITONEAL FLUID: Albumin, Fluid: <1.5 g/dL

## 2021-11-06 LAB — PROTEIN, PLEURAL OR PERITONEAL FLUID: Total protein, fluid: <3 g/dL

## 2021-11-06 LAB — CBC
HCT: 23.2 % — ABNORMAL LOW (ref 36.0–46.0)
Hemoglobin: 7.3 g/dL — ABNORMAL LOW (ref 12.0–15.0)
MCH: 29 pg (ref 26.0–34.0)
MCHC: 31.5 g/dL (ref 30.0–36.0)
MCV: 92.1 fL (ref 80.0–100.0)
Platelets: UNDETERMINED 10*3/uL (ref 150–400)
RBC: 2.52 MIL/uL — ABNORMAL LOW (ref 3.87–5.11)
RDW: 22.8 % — ABNORMAL HIGH (ref 11.5–15.5)
WBC: 3.5 10*3/uL — ABNORMAL LOW (ref 4.0–10.5)
nRBC: 0 % (ref 0.0–0.2)

## 2021-11-06 LAB — RESP PANEL BY RT-PCR (FLU A&B, COVID) ARPGX2
Influenza A by PCR: NEGATIVE
Influenza B by PCR: NEGATIVE
SARS Coronavirus 2 by RT PCR: NEGATIVE

## 2021-11-06 LAB — PROCALCITONIN: Procalcitonin: <0.1 ng/mL

## 2021-11-06 LAB — LACTATE DEHYDROGENASE: LDH: 216 U/L — ABNORMAL HIGH (ref 98–192)

## 2021-11-06 MED ORDER — MIDODRINE HCL 5 MG PO TABS
10.0000 mg | ORAL_TABLET | Freq: Three times a day (TID) | ORAL | Status: DC
  Administered 2021-11-06 – 2021-11-11 (×17): 10 mg via ORAL
  Filled 2021-11-06 (×17): qty 2

## 2021-11-06 MED ORDER — FUROSEMIDE 40 MG PO TABS
40.0000 mg | ORAL_TABLET | Freq: Two times a day (BID) | ORAL | Status: DC
  Administered 2021-11-06 – 2021-11-07 (×2): 40 mg via ORAL
  Filled 2021-11-06: qty 2
  Filled 2021-11-06 (×2): qty 1

## 2021-11-06 MED ORDER — LIDOCAINE HCL (PF) 1 % IJ SOLN
INTRAMUSCULAR | Status: AC
  Filled 2021-11-06: qty 30

## 2021-11-06 MED ORDER — ONDANSETRON HCL 4 MG PO TABS: 4.0000 mg | ORAL_TABLET | Freq: Four times a day (QID) | ORAL | Status: DC | PRN

## 2021-11-06 MED ORDER — KETOROLAC TROMETHAMINE 15 MG/ML IJ SOLN: 15.0000 mg | Freq: Three times a day (TID) | INTRAMUSCULAR | Status: DC | PRN

## 2021-11-06 MED ORDER — SODIUM CHLORIDE 0.9 % IV SOLN
2.0000 g | INTRAVENOUS | Status: DC
  Administered 2021-11-06: 2 g via INTRAVENOUS
  Filled 2021-11-06 (×2): qty 20

## 2021-11-06 MED ORDER — LACTULOSE 10 GM/15ML PO SOLN
20.0000 g | Freq: Two times a day (BID) | ORAL | Status: DC
  Administered 2021-11-06 (×2): 20 g via ORAL
  Filled 2021-11-06 (×2): qty 30

## 2021-11-06 MED ORDER — SODIUM CHLORIDE 0.9 % IV SOLN: 2.0000 g | INTRAVENOUS | Status: DC

## 2021-11-06 MED ORDER — PANTOPRAZOLE SODIUM 40 MG IV SOLR
40.0000 mg | Freq: Two times a day (BID) | INTRAVENOUS | Status: DC
  Administered 2021-11-06 – 2021-11-08 (×5): 40 mg via INTRAVENOUS
  Filled 2021-11-06 (×6): qty 40

## 2021-11-06 MED ORDER — ONDANSETRON HCL 4 MG/2ML IJ SOLN: 4.0000 mg | Freq: Four times a day (QID) | INTRAMUSCULAR | Status: DC | PRN

## 2021-11-06 MED ORDER — LACTATED RINGERS IV SOLN: INTRAVENOUS | Status: AC

## 2021-11-06 MED ORDER — LACTATED RINGERS IV SOLN: INTRAVENOUS | Status: DC

## 2021-11-06 MED ORDER — ALBUMIN HUMAN 25 % IV SOLN
12.5000 g | Freq: Once | INTRAVENOUS | Status: AC
  Administered 2021-11-06: 12.5 g via INTRAVENOUS
  Filled 2021-11-06: qty 50

## 2021-11-06 MED ORDER — ALBUMIN HUMAN 25 % IV SOLN
25.0000 g | Freq: Once | INTRAVENOUS | Status: AC
  Administered 2021-11-06: 25 g via INTRAVENOUS
  Filled 2021-11-06: qty 100

## 2021-11-06 MED ORDER — FOLIC ACID 1 MG PO TABS
1.0000 mg | ORAL_TABLET | Freq: Every day | ORAL | Status: DC
  Administered 2021-11-06 – 2021-11-11 (×6): 1 mg via ORAL
  Filled 2021-11-06 (×6): qty 1

## 2021-11-06 MED ORDER — PANTOPRAZOLE SODIUM 40 MG PO TBEC
40.0000 mg | DELAYED_RELEASE_TABLET | Freq: Every day | ORAL | Status: DC
  Administered 2021-11-06: 40 mg via ORAL
  Filled 2021-11-06: qty 1

## 2021-11-06 MED ORDER — METRONIDAZOLE 500 MG PO TABS
500.0000 mg | ORAL_TABLET | Freq: Two times a day (BID) | ORAL | Status: DC
  Administered 2021-11-06 – 2021-11-11 (×11): 500 mg via ORAL
  Filled 2021-11-06 (×11): qty 1

## 2021-11-06 NOTE — Consult Note (Signed)
Consultation  Referring Provider:     Harrold Donath Primary Care Physician:  Patient, [name] Primary Gastroenterologist:        Dustin Flock Reason for Consultation:     pleural effusion, decompensated cirrhosis         HPI:   Victoria Gordon is a 41 y.o. female with a history of decompensated alcoholic cirrhosis (has had extensive evaluation in the past including liver biopsy in [place]), history of ascites, SBP, large esophageal varices (no variceal bleeding noted yet), hepatic encephalopathy, severe anemia, admitted to the hospital with worsening ascites/pleural effusion.  She is followed by Dr. Candis Schatz in our office.  She has been on Lasix 40 mg twice daily and Aldactone 100 mg twice daily.  She states she is compliant with this, she states she avoids healthy foods and is on a low-sodium diet.  She denies any alcohol use since August of this past year.  She was admitted to [facility] earlier this week with abdominal pain and shortness of breath, rectal bleeding.  Her rectal bleeding has been presumed due to hemorrhoids after she has had an EGD and colonoscopy with no other clear cause for that.  She had a right-sided pleural effusion and 1.5 L of fluid was removed, I do not see that this was sent for cell count, only culture and cytology which were negative.  She is supposed to be taking Cipro daily for SBP prophylaxis.  She was discharged from [facility] on January 4, presented to our ED yesterday with worsening shortness of breath and ongoing ascites with abdominal discomfort.  In the ED she was hypotensive with blood pressures in the 63O to 75I systolics, afebrile.  She was started empirically on cefepime and daptomycin for possible sepsis.  Her renal function is normal, sodium low at 127.  She has chronically elevated livers enzymes with jaundice but her bilirubin is 14.4 on admission.  Baseline bili anywhere between 10 and 16 over the past few  months.  She is chronically coagulopathic with an INR greater than 3.  Her platelets are in the 30s.  White blood cell count 3.5, hemoglobin 7.3 today.  It looks like she was given some boluses of normal saline in the ED and then a bolus of lactated ringer, also given some albumin infusions.  I see that the patient was ordered Toradol however I do not believe she received any of this and this order was discontinued when it was noted.  She is not having any GI bleeding symptoms.  Her main problem is shortness of breath and abdominal discomfort which has been ongoing for the past several days.  Her mental status appears appropriate today, she states she ran out of lactulose and has not been taking that.  She states she is compliant with her diuretics as recommended and takes propranolol for her large varices.  She had an EGD in October of this year with Dr. Candis Schatz with large varices noted but she did not have banding done given her INR was almost 5 and platelets in the 30s without active bleeding at the time and was treated with propranolol.  He has not been to alcohol rehab but has been abstinent from alcohol for about 6 months.  She has not followed by hepatology at this time.  At time of evaluation in the ED today she had lactated Ringer running at 50 cc an hour.  She is scheduled for thoracentesis.   Past Medical  History:  Diagnosis Date   Alcohol abuse    Alcoholic cirrhosis of liver with ascites ([place])    Anemia    Anxiety    Hemorrhoid    Hepatitis B    Major depressive disorder    PTSD (post-traumatic stress disorder)    Substance abuse [facility])     Past Surgical History:  Procedure Laterality Date   BIOPSY  08/29/2021   Procedure: BIOPSY;  Surgeon: Daryel November, MD;  Location: [place];  Service: Gastroenterology;;   CESAREAN SECTION     CHOLECYSTECTOMY     COLONOSCOPY WITH PROPOFOL N/A 08/29/2021   Procedure: COLONOSCOPY WITH PROPOFOL;  Surgeon: Daryel November, MD;   Location: [facility] ENDOSCOPY;  Service: Gastroenterology;  Laterality: N/A;   ESOPHAGOGASTRODUODENOSCOPY (EGD) WITH PROPOFOL N/A 08/29/2021   Procedure: ESOPHAGOGASTRODUODENOSCOPY (EGD) WITH PROPOFOL;  Surgeon: Daryel November, MD;  Location: [place];  Service: Gastroenterology;  Laterality: N/A;   MOUTH SURGERY     TUBAL LIGATION Bilateral 07/22/2014   Procedure: POST PARTUM TUBAL LIGATION;  Surgeon: Woodroe Mode, MD;  Location: [facility];  Service: Gynecology;  Laterality: Bilateral;    Family History  Problem Relation Age of Onset   Diabetes Maternal Grandmother    Diabetes Paternal Grandfather      Social History   Tobacco Use   Smoking status: Never   Smokeless tobacco: Never  Vaping Use   Vaping Use: Never used  Substance Use Topics   Alcohol use: Not Currently    Comment: last drink in august   Drug use: No    Prior to Admission medications   Medication Sig Start Date End Date Taking? Authorizing Provider  ciprofloxacin (CIPRO) 500 MG tablet Take 1 tablet (500 mg total) by mouth daily. 09/27/21  Yes Daryel November, MD  folic acid (FOLVITE) 1 MG tablet Take 1 tablet (1 mg total) by mouth daily. 08/30/21  Yes Nita Sells, MD  furosemide (LASIX) 40 MG tablet Take 1 tablet (40 mg total) by mouth 2 (two) times daily. 08/30/21  Yes Nita Sells, MD  pantoprazole (PROTONIX) 40 MG tablet Take 1 tablet (40 mg total) by mouth daily. 08/30/21  Yes Nita Sells, MD  propranolol (INDERAL) 20 MG tablet Take 1 tablet (20 mg total) by mouth 3 (three) times daily. 09/27/21  Yes Daryel November, MD  spironolactone (ALDACTONE) 100 MG tablet Take 1 tablet (100 mg total) by mouth 2 (two) times daily. 08/30/21  Yes Nita Sells, MD  hydrocortisone (ANUSOL-HC) 25 MG suppository Place 1 suppository (25 mg total) rectally 2 (two) times daily. Patient not taking: Reported on 11/05/2021 11/03/21   Jennye Boroughs, MD  lactulose (CHRONULAC) 10 GM/15ML solution  Take 30 mLs (20 g total) by mouth 2 (two) times daily. 09/27/21   Daryel November, MD  rifaximin (XIFAXAN) 550 MG TABS tablet Take 1 tablet (550 mg total) by mouth 2 (two) times daily. Patient not taking: Reported on 11/05/2021 09/27/21   Daryel November, MD    Current Facility-Administered Medications  Medication Dose Route Frequency Provider Last Rate Last Admin   cefTRIAXone (ROCEPHIN) 2 g in sodium chloride 0.9 % 100 mL IVPB  2 g Intravenous Q24H Darliss Cheney, MD       folic acid (FOLVITE) tablet 1 mg  1 mg Oral Daily Chotiner, Yevonne Aline, MD   1 mg at 11/06/21 2542   furosemide (LASIX) tablet 40 mg  40 mg Oral BID Chotiner, Yevonne Aline, MD   40 mg at 11/06/21  0804   lactulose (CHRONULAC) 10 GM/15ML solution 20 g  20 g Oral BID Chotiner, Yevonne Aline, MD   20 g at 11/06/21 0927   lidocaine (PF) (XYLOCAINE) 1 % injection            metroNIDAZOLE (FLAGYL) tablet 500 mg  500 mg Oral Q12H Pahwani, Einar Grad, MD   500 mg at 11/06/21 0927   midodrine (PROAMATINE) tablet 10 mg  10 mg Oral TID Kristopher Oppenheim, DO   10 mg at 11/06/21 0927   ondansetron (ZOFRAN) tablet 4 mg  4 mg Oral Q6H PRN Chotiner, Yevonne Aline, MD       Or   ondansetron (ZOFRAN) injection 4 mg  4 mg Intravenous Q6H PRN Chotiner, Yevonne Aline, MD       pantoprazole (PROTONIX) EC tablet 40 mg  40 mg Oral Daily Chotiner, Yevonne Aline, MD   40 mg at 11/06/21 1884   Current Outpatient Medications  Medication Sig Dispense Refill   ciprofloxacin (CIPRO) 500 MG tablet Take 1 tablet (500 mg total) by mouth daily. 90 tablet 3   folic acid (FOLVITE) 1 MG tablet Take 1 tablet (1 mg total) by mouth daily. 30 tablet 12   furosemide (LASIX) 40 MG tablet Take 1 tablet (40 mg total) by mouth 2 (two) times daily. 60 tablet 2   pantoprazole (PROTONIX) 40 MG tablet Take 1 tablet (40 mg total) by mouth daily. 30 tablet 3   propranolol (INDERAL) 20 MG tablet Take 1 tablet (20 mg total) by mouth 3 (three) times daily. 60 tablet 1   spironolactone (ALDACTONE) 100 MG  tablet Take 1 tablet (100 mg total) by mouth 2 (two) times daily. 60 tablet 2   hydrocortisone (ANUSOL-HC) 25 MG suppository Place 1 suppository (25 mg total) rectally 2 (two) times daily. (Patient not taking: Reported on 11/05/2021) 12 suppository 0   lactulose (CHRONULAC) 10 GM/15ML solution Take 30 mLs (20 g total) by mouth 2 (two) times daily. 236 mL 0   rifaximin (XIFAXAN) 550 MG TABS tablet Take 1 tablet (550 mg total) by mouth 2 (two) times daily. (Patient not taking: Reported on 11/05/2021) 42 tablet 3    Allergies as of 11/05/2021 - Review Complete 11/05/2021  Allergen Reaction Noted   Vitamin k and related Hives, Shortness Of Breath, Itching, and Rash 09/24/2017   Vancomycin Hives and Itching 09/20/2017     Review of Systems:    As per HPI, otherwise negative    Physical Exam:  Vital signs in last 24 hours: Temp:  [98.2 F (36.8 C)] 98.2 F (36.8 C) (01/06 1247) Pulse Rate:  [54-68] 63 (01/07 0915) Resp:  [13-29] 16 (01/07 0915) BP: (73-101)/(35-70) 95/55 (01/07 0915) SpO2:  [97 %-100 %] 100 % (01/07 0915)   General:   Pleasant female in NAD Head:  Normocephalic and atraumatic. Eyes:   (+) icterus.   Conjunctiva pale Ears:  Normal auditory acuity. Neck:  Supple Lungs:  Respirations even and unlabored. Decreased breath sounds at right upper mid lung field and below.  Heart:  Regular rate and rhythm;  Abdomen:  Soft, protuberant, mildly distended with suspected ascites.  Rectal:  Not performed.  Msk:  Symmetrical without gross deformities.  Extremities:  (+) LE edema. Neurologic:  Alert and  oriented x4;  grossly normal neurologically. Skin:  Intact without significant lesions or rashes. Psych:  Alert and cooperative. Normal affect.  LAB RESULTS: Recent Labs    11/05/21 1314 11/05/21 1727 11/06/21 0602  WBC 5.1  --  3.5*  HGB 9.0* 8.8* 7.3*  HCT 28.8* 26.0* 23.2*  PLT 38*  --  PLATELET CLUMPS NOTED ON SMEAR, UNABLE TO ESTIMATE   BMET Recent Labs     11/05/21 1314 11/05/21 1727  NA 127* 132*  K 4.8 4.3  CL 103 103  CO2 20*  --   GLUCOSE 106* 82  BUN 8 7  CREATININE 0.80 0.80  CALCIUM 7.4*  --    LFT Recent Labs    11/05/21 1314  PROT 5.4*  ALBUMIN 1.8*  AST 65*  ALT 21  ALKPHOS 150*  BILITOT 14.4*   PT/INR Recent Labs    11/05/21 1314  LABPROT 31.7*  INR 3.1*    STUDIES: DG Chest 2 View  Result Date: 11/05/2021 CLINICAL DATA:  Chest pain, shortness of breath. EXAM: CHEST - 2 VIEW COMPARISON:  November 02, 2021. FINDINGS: The heart size and mediastinal contours are within normal limits. Left lung is clear. Increased right pleural effusion is noted with associated right basilar atelectasis or infiltrate. The visualized skeletal structures are unremarkable. IMPRESSION: Increased right pleural effusion is noted with associated right basilar atelectasis or infiltrate. Electronically Signed   By: Marijo Conception M.D.   On: 11/05/2021 13:39   CT CHEST ABDOMEN PELVIS W CONTRAST  Result Date: 11/05/2021 CLINICAL DATA:  Worsened right back pain, swelling, recent thoracentesis, thrombocytopenia, abdominal pain EXAM: CT CHEST, ABDOMEN, AND PELVIS WITH CONTRAST TECHNIQUE: Multidetector CT imaging of the chest, abdomen and pelvis was performed following the standard protocol during bolus administration of intravenous contrast. CONTRAST:  49mL OMNIPAQUE IOHEXOL 350 MG/ML SOLN COMPARISON:  08/20/2021, 11/05/2021 FINDINGS: CT CHEST FINDINGS Cardiovascular: The heart and great vessels are unremarkable without pericardial effusion. No evidence of thoracic aortic aneurysm or dissection. Mediastinum/Nodes: No enlarged mediastinal, hilar, or axillary lymph nodes. Thyroid gland, trachea, and esophagus demonstrate no significant findings. Large gastric and esophageal varices are noted. Lungs/Pleura: Large right pleural effusion volume estimated approximately 2 L. compressive atelectasis within the dependent right lung. Left chest is clear. No  pneumothorax. Musculoskeletal: There are no acute or destructive bony lesions. Subcutaneous edema within the lower back and bilateral flanks. Reconstructed images demonstrate no additional findings. CT ABDOMEN PELVIS FINDINGS Hepatobiliary: Nodular shrunken appearance of the liver consistent with cirrhosis. No focal liver abnormality on this single phase exam. Gallbladder is surgically absent. Pancreas: Unremarkable. No pancreatic ductal dilatation or surrounding inflammatory changes. Spleen: The spleen is enlarged, measuring approximately 15.6 cm in craniocaudal length. No focal parenchymal abnormalities. Adrenals/Urinary Tract: Adrenal glands are unremarkable. Kidneys are normal, without renal calculi, focal lesion, or hydronephrosis. Bladder is unremarkable. Stomach/Bowel: No bowel obstruction or ileus. Normal gas-filled appendix right lower quadrant. No bowel wall thickening or inflammatory change. 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 Splenic vein, portal vein, and SMV are patent. No other significant vascular findings. No pathologic adenopathy. Reproductive: Uterus and bilateral adnexa are unremarkable. Other: Small volume ascites. No free intraperitoneal gas. There is diffuse body wall edema. No abdominal wall hernia. Musculoskeletal: No acute or destructive bony lesions. Reconstructed images demonstrate no additional findings. IMPRESSION: 1. Large right pleural effusion volume estimated 2 L. No complication after reported right thoracentesis. 2. Cirrhosis, with portal venous hypertension manifested by large gastric/esophageal varices, splenomegaly, and small volume ascites. 3. Diffuse body wall edema. Electronically Signed   By: Randa Ngo M.D.   On: 11/05/2021 18:10       Impression / Plan:   41 year old female with a history of decompensated alcoholic  cirrhosis admitted with worsening shortness of breath/abdominal pain, found to have  recurrent large right-sided pleural effusion, hypotensive on presentation and improved following albumin administration.  This patient is critically ill, her MELD-Na is 33, she has significant coagulopathy/thrombocytopenia with large varices and is at risk for bleeding.  She was previously ordered tramadol in the ED which was canceled, I do not believe he received this.  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  She is in a very difficult situation at this point time.  We need to maximize her diuretic regimen to diurese her effusion however she is having some relative hypotension.  Recommend stopping IV fluids as this will third space and would use albumin infusions as needed. Recommend escalating her diuretics as her renal function allows once BP is stable.  The other way to manage this would be with a TIPS which would treat the hepatic hydrothorax, ascites, and her varices; however, her MELD and bilirubinemia make TIPS a much higher risk procedure and she also has hepatic encephalopathy which could worsen.  Agree with thoracentesis today to palliate her breathing, please send for cell count, total protein, albumin etc.  We will rule out infection in the fluid.  Agree with empiric antibiotics for now, she has a history of SBP and has been on prophylaxis.  We will escalate her diuretics as BP allows and monitor electrolytes and renal function closely.  She warrants referral to hepatologist for transplant evaluation, she is almost 6 months into sobriety from what she tells me.  Hopefully she can be stabilized / improved during this admission, if she deteriorates further spite these measures may consider inpatient transfer to a liver transplant center, she has seen Baldo Ash in the past.  Recommend: - stop toradol, NO NSAIDS - high risk for bleeding - stop IVF, would use albumin infusions PRN (BP seems better this AM) - continue midodrine - if her  BP stabilizes, ideally would increase diuretics from her prior baseline regimen - increase lasix to 40mg  TID and aldactone to 100mg  TID  - low Na diet - hold propranolol for now given issues with volume status - thoracentesis per IR today - send for fluid analysis to confirm hepatic hydrothorax. Per IR only one tap today, tomorrow would perform diagnostic paracentesis but would continue antibiotics regardless today - resume lactulose for history of hepatic encephalopathy - continue empiric antibiotics - ceftriaxone - continue protonix daily - trend renal function, INR, CBC daily - not a good candidate for TIPS unfortunately, warrants Hepatology / transplant evaluation in near future as above.  Call with questions we will follow with you.  Jolly Mango, MD [facility] Gastroenterology

## 2021-11-06 NOTE — Progress Notes (Signed)
PROGRESS NOTE    Victoria Gordon  BMW:[id number] DOB: 10-09-1981 DOA: 11/05/2021 PCP: Patient, [name]   Brief Narrative:  Victoria Gordon is a 41 y.o. female with medical history significant of Alcohol abuse, Alcoholic cirrhosis of liver with ascites, Anemia, Anxiety, Hemorrhoid, Hepatitis B, Major depressive disorder, PTSD, and Substance abuse who was discharged from [facility] on 11/03/2021 after having thoracentesis for pleural effusion.  During her hospitalization she received 1 unit of packed red blood cells for anemia with hemoglobin of 6.9.  GI was not consulted.  She was discharged home since she was stable.  She returned to ED with epigastric right upper quadrant abdominal pain as well as pain in her right lateral chest wall with increased shortness of breath.  She also had nausea she reports having black tarry stools for the last few weeks along with rectal bright red bleed.  She does admit to having a occasional periods of confusion which she attributes to her liver disease. She has not had any fever or chills and has not had no known sick contacts. Has had decreased appetite the last 2 days.  She has jaundice of her skin which is unchanged for the past few weeks she states.  She describes the pain in her abdomen and side is an aching pain that does not radiate.    In the emergency room he has had soft blood pressure in the 80-100/43-56 range, pulse 50-60 respirations 15-27.  She has been afebrile.  She was started on cefepime and daptomycin for possible early sepsis with bradycardia and low blood pressures below 90 systolically..  She does have history of bacteremia.  There is risk of spontaneous bacterial peritonitis  Lactic acid level 0.9, WBC 5100 hemoglobin 9. Patient was given albumin infusion in the emergency room.  Hospital service was asked to admit for further management  Assessment & Plan:   Principal Problem:   Pleural effusion associated with  hepatic disorder Active Problems:   Thrombocytopenia (HCC)   Hyperammonemia (HCC)   Alcoholic cirrhosis of liver with ascites (HCC)   Anemia   Hypotension   Hyperbilirubinemia  Large right-sided pleural effusion/hydrothorax: Likely secondary to liver cirrhosis.  Patient appears to be comfortable, she is not hypoxic or dyspneic.  Thoracentesis ordered, IR consulted.  Alcoholic cirrhosis of liver with ascites/possible SBP: Patient does endorse some abdominal pain and she is tender on palpation as well.  Has minimal ascites on my examination.  I have altered ultrasound abdomen to assess the size of the ascites and have requested IR to perform diagnostic paracentesis.  She is on cefepime and daptomycin for what admitting hospitalist called sepsis but patient does not meet any criteria for sepsis.  I will stop those and transition her to Rocephin.  Melena/bright red blood per rectum: Acute blood loss anemia with known history of esophageal varices: Her hemoglobin was 8.4 when she left [facility] on 11/03/2021, now dropped to 7.3.  Will monitor every 12 hours and transfuse if less than 7.  I will switch her from p.o. Protonix to IV twice daily Protonix.  GI is consulted and we will defer to them about decision for EGD and colonoscopy.  Hyperammonemia: Elevated but this is her baseline.   Hypotension: Blood pressure systolic around 27O.  Per patient, usually her systolic runs around 536.  She feels little dizzy.  This is likely due to large pleural effusion.  Hopefully blood pressure will improve after thoracentesis. Resume propranolol when blood pressure  improves   Chronic pancytopenia: Likely secondary to marrow suppression due to chronic alcoholism.  Platelets at her baseline.  Avoid heparin products.   Hyperbilirubinemia Chronic and stable.  Secondary to cirrhosis.  Trichomonas vaginalis: Her UA is positive for trichomonas.  Will treat with Flagyl 500 mg p.o. twice daily for 7 days.  DVT  prophylaxis: SCDs Start: 11/06/21 0602   Code Status: Full Code  Family Communication:  None present at bedside.  Plan of care discussed with patient in length and he/she verbalized understanding and agreed with it.  Status is: Inpatient  Remains inpatient appropriate because: She is very sick.  Estimated body mass index is 36.99 kg/m as calculated from the following:   Height as of 11/01/21: 5\' 1"  (1.549 m).   Weight as of 11/03/21: 88.8 kg.    Nutritional Assessment: There is no height or weight on file to calculate BMI.. Seen by dietician.  I agree with the assessment and plan as outlined below: Nutrition Status:    Skin Assessment: I have examined the patient's skin and I agree with the wound assessment as performed by the wound care RN as outlined below:    Consultants:  GI IR  Procedures:  None  Antimicrobials:  Anti-infectives (From admission, onward)    Start     Dose/Rate Route Frequency Ordered Stop   11/06/21 1030  cefTRIAXone (ROCEPHIN) 2 g in sodium chloride 0.9 % 100 mL IVPB        2 g 200 mL/hr over 30 Minutes Intravenous Every 24 hours 11/06/21 1023     11/06/21 1000  metroNIDAZOLE (FLAGYL) tablet 500 mg        500 mg Oral Every 12 hours 11/06/21 0852 11/13/21 0959   11/05/21 2200  linezolid (ZYVOX) IVPB 600 mg  Status:  Discontinued        600 mg 300 mL/hr over 60 Minutes Intravenous Every 12 hours 11/05/21 1749 11/05/21 1800   11/05/21 1815  DAPTOmycin (CUBICIN) 700 mg in sodium chloride 0.9 % IVPB  Status:  Discontinued        8 mg/kg  88.8 kg 128 mL/hr over 30 Minutes Intravenous Daily 11/05/21 1808 11/06/21 1023   11/05/21 1745  ceFEPIme (MAXIPIME) 2 g in sodium chloride 0.9 % 100 mL IVPB  Status:  Discontinued        2 g 200 mL/hr over 30 Minutes Intravenous Every 8 hours 11/05/21 1739 11/06/21 1023         Subjective: Seen and examined in the ER.  Complains of some left-sided lower chest pain and left upper quadrant abdominal pain.  Denies  any shortness of breath.  No other complaint.  Objective: Vitals:   11/06/21 0415 11/06/21 0430 11/06/21 0715 11/06/21 0915  BP: (!) 87/48 (!) 87/52 (!) 91/50 (!) 95/55  Pulse: 63 (!) 58 64 63  Resp: (!) 25 (!) 21 (!) 23 16  Temp:      TempSrc:      SpO2: 97% 99% 99% 100%    Intake/Output Summary (Last 24 hours) at 11/06/2021 1024 Last data filed at 11/06/2021 0802 Gross per 24 hour  Intake 2652.18 ml  Output 400 ml  Net 2252.18 ml   There were no vitals filed for this visit.  Examination:  General exam: Appears calm and comfortable, visibly icteric Respiratory system: Clear to auscultation except in the right middle and lower lobe where she has diminished breath sounds. Respiratory effort normal. Cardiovascular system: S1 & S2 heard, RRR. No JVD, murmurs, rubs,  gallops or clicks. No pedal edema. Gastrointestinal system: Abdomen is nondistended, soft, positive fluid shift, generalized tenderness. No organomegaly or masses felt. Normal bowel sounds heard. Central nervous system: Alert and oriented. No focal neurological deficits. Extremities: Symmetric 5 x 5 power. Skin: No rashes, lesions or ulcers Psychiatry: Judgement and insight appear normal. Mood & affect appropriate.    Data Reviewed: I have personally reviewed following labs and imaging studies  CBC: Recent Labs  Lab 11/01/21 1331 11/02/21 0523 11/03/21 0611 11/03/21 1356 11/05/21 1314 11/05/21 1727 11/06/21 0602  WBC 4.8 3.0* 3.7*  --  5.1  --  3.5*  NEUTROABS  --   --   --   --  3.4  --   --   HGB 6.9* 6.2* 7.7* 8.4* 9.0* 8.8* 7.3*  HCT 22.1* 20.2* 24.6* 26.1* 28.8* 26.0* 23.2*  MCV 100.5* 100.0 90.1  --  92.9  --  92.1  PLT 52* 35* 30*  --  38*  --  PLATELET CLUMPS NOTED ON SMEAR, UNABLE TO ESTIMATE   Basic Metabolic Panel: Recent Labs  Lab 11/01/21 1331 11/02/21 0523 11/03/21 0611 11/05/21 1314 11/05/21 1727  NA 132* 133* 132* 127* 132*  K 4.1 3.9 3.5 4.8 4.3  CL 106 108 106 103 103  CO2 20* 18*  20* 20*  --   GLUCOSE 128* 83 99 106* 82  BUN 13 13 12 8 7   CREATININE 0.81 0.69 0.71 0.80 0.80  CALCIUM 7.5* 7.5* 7.4* 7.4*  --    GFR: Estimated Creatinine Clearance: 96.6 mL/min (by C-G formula based on SCr of 0.8 mg/dL). Liver Function Tests: Recent Labs  Lab 11/01/21 1331 11/02/21 0523 11/03/21 0611 11/05/21 1314  AST 51* 39 41 65*  ALT 23 20 22 21   ALKPHOS 123 110 106 150*  BILITOT 14.4* 11.2* 11.9* 14.4*  PROT 5.6* 4.5* 4.6* 5.4*  ALBUMIN 2.0* 1.6* 1.7* 1.8*   Recent Labs  Lab 11/01/21 1331 11/05/21 1500  LIPASE 66* 60*   Recent Labs  Lab 11/05/21 1314  AMMONIA 49*   Coagulation Profile: Recent Labs  Lab 11/01/21 1331 11/05/21 1314  INR 2.9* 3.1*   Cardiac Enzymes: No results for input(s): CKTOTAL, CKMB, CKMBINDEX, TROPONINI in the last 168 hours. BNP (last 3 results) No results for input(s): PROBNP in the last 8760 hours. HbA1C: No results for input(s): HGBA1C in the last 72 hours. CBG: No results for input(s): GLUCAP in the last 168 hours. Lipid Profile: No results for input(s): CHOL, HDL, LDLCALC, TRIG, CHOLHDL, LDLDIRECT in the last 72 hours. Thyroid Function Tests: No results for input(s): TSH, T4TOTAL, FREET4, T3FREE, THYROIDAB in the last 72 hours. Anemia Panel: No results for input(s): VITAMINB12, FOLATE, FERRITIN, TIBC, IRON, RETICCTPCT in the last 72 hours. Sepsis Labs: Recent Labs  Lab 11/05/21 1606 11/05/21 1806  LATICACIDVEN 0.9 1.2    Recent Results (from the past 240 hour(s))  Resp Panel by RT-PCR (Flu A&B, Covid) Nasopharyngeal Swab     Status: None   Collection Time: 11/01/21  5:30 PM   Specimen: Nasopharyngeal Swab; Nasopharyngeal(NP) swabs in vial transport medium  Result Value Ref Range Status   SARS Coronavirus 2 by RT PCR NEGATIVE NEGATIVE Final    Comment: (NOTE) SARS-CoV-2 target nucleic acids are NOT DETECTED.  The SARS-CoV-2 RNA is generally detectable in upper respiratory specimens during the acute phase of  infection. The lowest concentration of SARS-CoV-2 viral copies this assay can detect is 138 copies/mL. A negative result does not preclude SARS-Cov-2 infection and should  not be used as the sole basis for treatment or other patient management decisions. A negative result may occur with  improper specimen collection/handling, submission of specimen other than nasopharyngeal swab, presence of viral mutation(s) within the areas targeted by this assay, and inadequate number of viral copies(<138 copies/mL). A negative result must be combined with clinical observations, patient history, and epidemiological information. The expected result is Negative.  Fact Sheet for Patients:  EntrepreneurPulse.com.au  Fact Sheet for Healthcare Providers:  IncredibleEmployment.be  This test is no t yet approved or cleared by the [place] FDA and  has been authorized for detection and/or diagnosis of SARS-CoV-2 by FDA under an Emergency Use Authorization (EUA). This EUA will remain  in effect (meaning this test can be used) for the duration of the COVID-19 declaration under Section 564(b)(1) of the Act, 21 U.S.C.section 360bbb-3(b)(1), unless the authorization is terminated  or revoked sooner.       Influenza A by PCR NEGATIVE NEGATIVE Final   Influenza B by PCR NEGATIVE NEGATIVE Final    Comment: (NOTE) The Xpert Xpress SARS-CoV-2/FLU/RSV plus assay is intended as an aid in the diagnosis of influenza from Nasopharyngeal swab specimens and should not be used as a sole basis for treatment. Nasal washings and aspirates are unacceptable for Xpert Xpress SARS-CoV-2/FLU/RSV testing.  Fact Sheet for Patients: EntrepreneurPulse.com.au  Fact Sheet for Healthcare Providers: IncredibleEmployment.be  This test is not yet approved or cleared by the [place] FDA and has been authorized for detection and/or diagnosis of SARS-CoV-2  by FDA under an Emergency Use Authorization (EUA). This EUA will remain in effect (meaning this test can be used) for the duration of the COVID-19 declaration under Section 564(b)(1) of the Act, 21 U.S.C. section 360bbb-3(b)(1), unless the authorization is terminated or revoked.  Performed at [facility], [address] [place], [place] [address]   Gram stain     Status: None   Collection Time: 11/02/21 10:35 AM   Specimen: Pleura  Result Value Ref Range Status   Specimen Description PLEURAL  Final   Special Requests NONE  Final   Gram Stain   Final    NO ORGANISMS SEEN WBC PRESENT,BOTH PMN AND MONONUCLEAR CYTOSPIN SMEAR Performed at [facility], [address] [place], [place] [address]    Report Status 11/02/2021 FINAL  Final  Culture, body fluid w Gram Stain-bottle     Status: None   Collection Time: 11/02/21 10:35 AM   Specimen: Pleura  Result Value Ref Range Status   Specimen Description PLEURAL  Final   Special Requests BOTTLES DRAWN AEROBIC AND ANAEROBIC 10 CC  Final   Gram Stain   Final    NO ORGANISMS SEEN WBC PRESENT,BOTH PMN AND MONONUCLEAR CYTOSPIN SMEAR    Culture   Final    NO GROWTH < 12 HOURS Performed at [facility], [address] [place], [place] [address]    Report Status 11/02/2021 FINAL  Final  Blood culture (routine x 2)     Status: None (Preliminary result)   Collection Time: 11/05/21  4:09 PM   Specimen: BLOOD  Result Value Ref Range Status   Specimen Description BLOOD SITE NOT SPECIFIED  Final   Special Requests   Final    BOTTLES DRAWN AEROBIC AND ANAEROBIC Blood Culture adequate volume   Culture   Final    NO GROWTH < 24 HOURS Performed at [facility], [place] [address] [place], [place] [address]    Report Status PENDING  Incomplete  Resp Panel by RT-PCR (Flu A&B, Covid) Nasopharyngeal Swab     Status: None   Collection Time: 11/05/21 11:10 PM   Specimen: Nasopharyngeal Swab; Nasopharyngeal(NP) swabs in vial transport medium   Result Value Ref Range Status   SARS Coronavirus 2 by RT PCR NEGATIVE NEGATIVE Final    Comment: (NOTE) SARS-CoV-2 target nucleic acids are NOT DETECTED.  The SARS-CoV-2 RNA is generally detectable in upper respiratory specimens during the acute phase of infection. The lowest concentration of SARS-CoV-2 viral copies this assay can detect is 138 copies/mL. A negative result does not preclude SARS-Cov-2 infection and should not be used as the sole basis for treatment or other patient management decisions. A negative result may occur with  improper specimen collection/handling, submission of specimen other than nasopharyngeal swab, presence of viral mutation(s) within the areas targeted by this assay, and inadequate number of viral copies(<138 copies/mL). A negative result must be combined with clinical observations, patient history, and epidemiological information. The expected result is Negative.  Fact Sheet for Patients:  EntrepreneurPulse.com.au  Fact Sheet for Healthcare Providers:  IncredibleEmployment.be  This test is no t yet approved or cleared by the [place] FDA and  has been authorized for detection and/or diagnosis of SARS-CoV-2 by FDA under an Emergency Use Authorization (EUA). This EUA will remain  in effect (meaning this test can be used) for the duration of the COVID-19 declaration under Section 564(b)(1) of the Act, 21 U.S.C.section 360bbb-3(b)(1), unless the authorization is terminated  or revoked sooner.       Influenza A by PCR NEGATIVE NEGATIVE Final   Influenza B by PCR NEGATIVE NEGATIVE Final    Comment: (NOTE) The Xpert Xpress SARS-CoV-2/FLU/RSV plus assay is intended as an aid in the diagnosis of influenza from Nasopharyngeal swab specimens and should not be used as a sole basis for treatment. Nasal washings and aspirates are unacceptable for Xpert Xpress SARS-CoV-2/FLU/RSV testing.  Fact Sheet for  Patients: EntrepreneurPulse.com.au  Fact Sheet for Healthcare Providers: IncredibleEmployment.be  This test is not yet approved or cleared by the [place] FDA and has been authorized for detection and/or diagnosis of SARS-CoV-2 by FDA under an Emergency Use Authorization (EUA). This EUA will remain in effect (meaning this test can be used) for the duration of the COVID-19 declaration under Section 564(b)(1) of the Act, 21 U.S.C. section 360bbb-3(b)(1), unless the authorization is terminated or revoked.  Performed at [facility], [place] [address] [place], [place] [address]      Radiology Studies: DG Chest 2 View  Result Date: 11/05/2021 CLINICAL DATA:  Chest pain, shortness of breath. EXAM: CHEST - 2 VIEW COMPARISON:  November 02, 2021. FINDINGS: The heart size and mediastinal contours are within normal limits. Left lung is clear. Increased right pleural effusion is noted with associated right basilar atelectasis or infiltrate. The visualized skeletal structures are unremarkable. IMPRESSION: Increased right pleural effusion is noted with associated right basilar atelectasis or infiltrate. Electronically Signed   By: Marijo Conception M.D.   On: 11/05/2021 13:39   CT CHEST ABDOMEN PELVIS W CONTRAST  Result Date: 11/05/2021 CLINICAL DATA:  Worsened right back pain, swelling, recent thoracentesis, thrombocytopenia, abdominal pain EXAM: CT CHEST, ABDOMEN, AND PELVIS WITH CONTRAST TECHNIQUE: Multidetector CT imaging of the chest, abdomen and pelvis was performed following the standard protocol during bolus administration of intravenous contrast. CONTRAST:  20mL OMNIPAQUE IOHEXOL 350 MG/ML SOLN COMPARISON:  08/20/2021, 11/05/2021 FINDINGS: CT CHEST FINDINGS Cardiovascular: The heart and great vessels are unremarkable without  pericardial effusion. No evidence of thoracic aortic aneurysm or dissection. Mediastinum/Nodes: No enlarged mediastinal, hilar, or  axillary lymph nodes. Thyroid gland, trachea, and esophagus demonstrate no significant findings. Large gastric and esophageal varices are noted. Lungs/Pleura: Large right pleural effusion volume estimated approximately 2 L. compressive atelectasis within the dependent right lung. Left chest is clear. No pneumothorax. Musculoskeletal: There are no acute or destructive bony lesions. Subcutaneous edema within the lower back and bilateral flanks. Reconstructed images demonstrate no additional findings. CT ABDOMEN PELVIS FINDINGS Hepatobiliary: Nodular shrunken appearance of the liver consistent with cirrhosis. No focal liver abnormality on this single phase exam. Gallbladder is surgically absent. Pancreas: Unremarkable. No pancreatic ductal dilatation or surrounding inflammatory changes. Spleen: The spleen is enlarged, measuring approximately 15.6 cm in craniocaudal length. No focal parenchymal abnormalities. Adrenals/Urinary Tract: Adrenal glands are unremarkable. Kidneys are normal, without renal calculi, focal lesion, or hydronephrosis. Bladder is unremarkable. Stomach/Bowel: No bowel obstruction or ileus. Normal gas-filled appendix right lower quadrant. No bowel wall thickening or inflammatory change. 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 Splenic vein, portal vein, and SMV are patent. No other significant vascular findings. No pathologic adenopathy. Reproductive: Uterus and bilateral adnexa are unremarkable. Other: Small volume ascites. No free intraperitoneal gas. There is diffuse body wall edema. No abdominal wall hernia. Musculoskeletal: No acute or destructive bony lesions. Reconstructed images demonstrate no additional findings. IMPRESSION: 1. Large right pleural effusion volume estimated 2 L. No complication after reported right thoracentesis. 2. Cirrhosis, with portal venous hypertension manifested by large gastric/esophageal varices, splenomegaly,  and small volume ascites. 3. Diffuse body wall edema. Electronically Signed   By: Randa Ngo M.D.   On: 11/05/2021 18:10    Scheduled Meds:  folic acid  1 mg Oral Daily   furosemide  40 mg Oral BID   lactulose  20 g Oral BID   metroNIDAZOLE  500 mg Oral Q12H   midodrine  10 mg Oral TID   pantoprazole  40 mg Oral Daily   Continuous Infusions:  cefTRIAXone (ROCEPHIN)  IV     lactated ringers 50 mL/hr at 11/06/21 0805     LOS: 1 day   Time spent: 106 minutes  Darliss Cheney, MD [facility]  11/06/2021, 10:24 AM  Please page via [facility] and do not message via secure chat for anything urgent. Secure chat can be used for anything non urgent.  How to contact the [facility] Attending or Consulting provider [place] or covering provider during after hours [place], for this patient?  Check the care team in [facility] and look for a) attending/consulting [facility] provider listed and b) the [facility] team listed. Page or secure chat [phone]. Log into [email or web address] and use [facility]'s universal password to access. If you do not have the password, please contact the hospital operator. Locate the [facility] provider you are looking for under [facility] and page to a number that you can be directly reached. If you still have difficulty reaching the provider, please page the [facility] (Director on Call) for the Hospitalists listed on amion for assistance.

## 2021-11-06 NOTE — ED Notes (Signed)
Patient placed in hospital bed

## 2021-11-06 NOTE — ED Notes (Signed)
Pt tx to [place]

## 2021-11-06 NOTE — Progress Notes (Signed)
°  Transition of Care [facility]) Screening Note   Patient Details  Name: Victoria Gordon Date of Birth: February 16, 1981   Transition of Care [facility]) CM/SW Contact:    Alfredia Ferguson, LCSW Phone Number: 11/06/2021, 10:06 AM    Transition of Care Department [facility]) has reviewed patient and noted consult of need for [facility] vs. SNF vs. CIR. TOC will follow pending further medical evaluation, PT evaluation and recommendation. CSW noted history of alcohol use and added SU resources into AVS. TOC will continue to follow at this time.

## 2021-11-06 NOTE — Progress Notes (Signed)
Called to assess this patient while on the unit. She had complaints of bilateral leg numbness after she returned from the bathroom. Reported sensation felt bilaterally, 5/5 LE strength. NIHSS of 1 due to abnormal sensation. No focal deficits noted on exam. NIHSS documented in chart

## 2021-11-06 NOTE — ED Notes (Signed)
Lab to add Procal

## 2021-11-06 NOTE — ED Notes (Signed)
ED TO INPATIENT HANDOFF REPORT  ED Nurse Name and Phone #: Teodoro Spray [phone]  S Name/Age/Gender Victoria Gordon 41 y.o. female Room/Bed: 016C/016C  Code Status   Code Status: Full Code  Home/SNF/Other Home Patient oriented to: self, place, time, and situation Is this baseline? Yes   Triage Complete: Triage complete  Chief Complaint Pleural effusion associated with hepatic disorder [K76.9, J91.8]  Triage Note Pt here POV with c/o SOB, abdominal pain and fatigue. Pt seen Forestine Na Monday. Pt reports 1.5L fluid removed. Pt states it has returned. Pt rates pain 7/10. Jaundice severe    Allergies Allergies  Allergen Reactions   Vitamin K And Related Hives, Shortness Of Breath, Itching and Rash    Tolerated on 08/21/21 without reaction   Vancomycin Hives and Itching    Level of Care/Admitting Diagnosis ED Disposition     ED Disposition  Admit   Condition  --   Maish Vaya: Pax [[id number]]  Level of Care: Progressive [102]  Admit to Progressive based on following criteria: MULTISYSTEM THREATS such as stable sepsis, metabolic/electrolyte imbalance with or without encephalopathy that is responding to early treatment.  May admit patient to [facility] or [facility] if equivalent level of care is available:: Yes  Covid Evaluation: Confirmed COVID Negative  Diagnosis: Pleural effusion associated with hepatic disorder [163845]  Admitting Physician: Eben Burow [[id number]]  Attending Physician: Eben Burow [[id number]]  Estimated length of stay: past midnight tomorrow  Certification:: I certify this patient will need inpatient services for at least 2 midnights          B Medical/Surgery History Past Medical History:  Diagnosis Date   Alcohol abuse    Alcoholic cirrhosis of liver with ascites ([place])    Anemia    Anxiety    Hemorrhoid    Hepatitis B    Major depressive disorder    PTSD (post-traumatic stress disorder)     Substance abuse ([place])    Past Surgical History:  Procedure Laterality Date   BIOPSY  08/29/2021   Procedure: BIOPSY;  Surgeon: Daryel November, MD;  Location: [facility];  Service: Gastroenterology;;   CESAREAN SECTION     CHOLECYSTECTOMY     COLONOSCOPY WITH PROPOFOL N/A 08/29/2021   Procedure: COLONOSCOPY WITH PROPOFOL;  Surgeon: Daryel November, MD;  Location: [facility];  Service: Gastroenterology;  Laterality: N/A;   ESOPHAGOGASTRODUODENOSCOPY (EGD) WITH PROPOFOL N/A 08/29/2021   Procedure: ESOPHAGOGASTRODUODENOSCOPY (EGD) WITH PROPOFOL;  Surgeon: Daryel November, MD;  Location: [facility];  Service: Gastroenterology;  Laterality: N/A;   MOUTH SURGERY     TUBAL LIGATION Bilateral 07/22/2014   Procedure: POST PARTUM TUBAL LIGATION;  Surgeon: Woodroe Mode, MD;  Location: [facility];  Service: Gynecology;  Laterality: Bilateral;     A IV Location/Drains/Wounds Patient Lines/Drains/Airways Status     Active Line/Drains/Airways     Name Placement date Placement time Site Days   Peripheral IV 11/05/21 20 G Right Antecubital 11/05/21  1418  Antecubital  1   Incision (Closed) 08/21/21 Flank Lower;Right 08/21/21  1430  -- 77            Intake/Output Last 24 hours  Intake/Output Summary (Last 24 hours) at 11/06/2021 1317 Last data filed at 11/06/2021 1311 Gross per 24 hour  Intake 2755.71 ml  Output 400 ml  Net 2355.71 ml    Labs/Imaging Results for orders placed or performed during the hospital encounter of 11/05/21 (from the past 48  hour(s))  Comprehensive metabolic panel     Status: Abnormal   Collection Time: 11/05/21  1:14 PM  Result Value Ref Range   Sodium 127 (L) 135 - 145 mmol/L   Potassium 4.8 3.5 - 5.1 mmol/L   Chloride 103 98 - 111 mmol/L   CO2 20 (L) 22 - 32 mmol/L   Glucose, Bld 106 (H) 70 - 99 mg/dL    Comment: Glucose reference range applies only to samples taken after fasting for at least 8 hours.   BUN 8 6 - 20 mg/dL   Creatinine, Ser  0.80 0.44 - 1.00 mg/dL   Calcium 7.4 (L) 8.9 - 10.3 mg/dL   Total Protein 5.4 (L) 6.5 - 8.1 g/dL   Albumin 1.8 (L) 3.5 - 5.0 g/dL   AST 65 (H) 15 - 41 U/L   ALT 21 0 - 44 U/L   Alkaline Phosphatase 150 (H) 38 - 126 U/L   Total Bilirubin 14.4 (H) 0.3 - 1.2 mg/dL   GFR, Estimated >60 >60 mL/min    Comment: (NOTE) Calculated using the CKD-EPI Creatinine Equation (2021)    Anion gap 4 (L) 5 - 15    Comment: Performed at [facility], [place] [address] [place], [place] [address]  CBC with Differential     Status: Abnormal   Collection Time: 11/05/21  1:14 PM  Result Value Ref Range   WBC 5.1 4.0 - 10.5 K/uL   RBC 3.10 (L) 3.87 - 5.11 MIL/uL   Hemoglobin 9.0 (L) 12.0 - 15.0 g/dL   HCT 28.8 (L) 36.0 - 46.0 %   MCV 92.9 80.0 - 100.0 fL   MCH 29.0 26.0 - 34.0 pg   MCHC 31.3 30.0 - 36.0 g/dL   RDW 22.5 (H) 11.5 - 15.5 %   Platelets 38 (L) 150 - 400 K/uL    Comment: Immature Platelet Fraction may be clinically indicated, consider ordering this additional test [id number] CONSISTENT WITH PREVIOUS RESULT PLATELET COUNT CONFIRMED BY SMEAR    nRBC 0.0 0.0 - 0.2 %   Neutrophils Relative % 67 %   Neutro Abs 3.4 1.7 - 7.7 K/uL   Lymphocytes Relative 18 %   Lymphs Abs 0.9 0.7 - 4.0 K/uL   Monocytes Relative 11 %   Monocytes Absolute 0.6 0.1 - 1.0 K/uL   Eosinophils Relative 3 %   Eosinophils Absolute 0.2 0.0 - 0.5 K/uL   Basophils Relative 1 %   Basophils Absolute 0.0 0.0 - 0.1 K/uL   Immature Granulocytes 0 %   Abs Immature Granulocytes 0.02 0.00 - 0.07 K/uL    Comment: Performed at [facility], [place] [address], [place], [place] [address]  Protime-INR     Status: Abnormal   Collection Time: 11/05/21  1:14 PM  Result Value Ref Range   Prothrombin Time 31.7 (H) 11.4 - 15.2 seconds   INR 3.1 (H) 0.8 - 1.2    Comment: (NOTE) INR goal varies based on device and disease states. Performed at [facility], [place] [address] [place], [place] [address]   Ammonia     Status:  Abnormal   Collection Time: 11/05/21  1:14 PM  Result Value Ref Range   Ammonia 49 (H) 9 - 35 umol/L    Comment: Performed at [facility], [place] [address] [place],  [address]  Type and screen [place]     Status: None (Preliminary result)   Collection Time: 11/05/21  3:00 PM  Result Value Ref Range   ABO/RH(D) O POS    Antibody Screen POS    Sample Expiration 11/08/2021,2359    Antibody Identification      ANTI c ANTI E Performed at [facility], [place] [address] [place], [place] [address]    Unit Number [id number]    Blood Component Type RED CELLS,LR    Unit division 00    Status of Unit ALLOCATED    Donor AG Type NEGATIVE FOR E ANTIGEN NEGATIVE FOR c ANTIGEN    Transfusion Status OK TO TRANSFUSE    Crossmatch Result COMPATIBLE    Unit Number [id number]    Blood Component Type RED CELLS,LR    Unit division 00    Status of Unit ALLOCATED    Donor AG Type NEGATIVE FOR E ANTIGEN NEGATIVE FOR c ANTIGEN    Transfusion Status OK TO TRANSFUSE    Crossmatch Result COMPATIBLE   Lipase, blood     Status: Abnormal   Collection Time: 11/05/21  3:00 PM  Result Value Ref Range   Lipase 60 (H) 11 - 51 U/L    Comment: Performed at [facility], [address], [place], [place] [address]  I-Stat Beta hCG blood, ED ([facility], [facility], [facility] only)     Status: None   Collection Time: 11/05/21  3:09 PM  Result Value Ref Range   I-stat hCG, quantitative <5.0 <5 mIU/mL   Comment 3            Comment:   GEST. AGE      CONC.  (mIU/mL)   <=1 WEEK        5 - 50     2 WEEKS       50 - 500     3 WEEKS       100 - 10,000     4 WEEKS     1,000 - 30,000        FEMALE AND NON-PREGNANT FEMALE:     LESS THAN 5 mIU/mL   Troponin I (High Sensitivity)     Status: None   Collection Time: 11/05/21  3:13 PM  Result Value Ref Range   Troponin I (High Sensitivity) 6 <18 ng/L    Comment: (NOTE) Elevated high sensitivity troponin I (hsTnI) values and significant  changes  across serial measurements may suggest ACS but many other  chronic and acute conditions are known to elevate hsTnI results.  Refer to the "Links" section for chest pain algorithms and additional  guidance. Performed at [facility], [place] [address] [place], [place] [address]   Ethanol     Status: None   Collection Time: 11/05/21  3:26 PM  Result Value Ref Range   Alcohol, Ethyl (B) <10 <10 mg/dL    Comment: (NOTE) Lowest detectable limit for serum alcohol is 10 mg/dL.  For medical purposes only. Performed at [facility], [place] [address] [place], [place] [address]   Troponin I (High Sensitivity)     Status: None   Collection Time: 11/05/21  3:50 PM  Result Value Ref Range   Troponin I (High Sensitivity) 4 <18 ng/L    Comment: (NOTE) Elevated high sensitivity troponin I (hsTnI) values and significant  changes across serial measurements may suggest ACS but many other  chronic and acute conditions are known to elevate hsTnI results.  Refer to the "Links" section for chest pain algorithms and additional  guidance. Performed at [facility], [place]  [address], [place], [place] [address]   Lactic acid, plasma     Status: None   Collection Time: 11/05/21  4:06 PM  Result Value Ref Range   Lactic Acid, Venous 0.9 0.5 - 1.9 mmol/L    Comment: Performed at [place] [address] [place], [place] [address]  Blood culture (routine x 2)     Status: None (Preliminary result)   Collection Time: 11/05/21  4:09 PM   Specimen: BLOOD  Result Value Ref Range   Specimen Description BLOOD SITE NOT SPECIFIED    Special Requests      BOTTLES DRAWN AEROBIC AND ANAEROBIC Blood Culture adequate volume   Culture      NO GROWTH < 24 HOURS Performed at [facility], [place] [address] [place], [place] [address]    Report Status PENDING   I-stat chem 8, ED (not at [facility] or [facility])     Status: Abnormal   Collection Time: 11/05/21  5:27 PM  Result Value Ref Range   Sodium  132 (L) 135 - 145 mmol/L   Potassium 4.3 3.5 - 5.1 mmol/L   Chloride 103 98 - 111 mmol/L   BUN 7 6 - 20 mg/dL   Creatinine, Ser 0.80 0.44 - 1.00 mg/dL   Glucose, Bld 82 70 - 99 mg/dL    Comment: Glucose reference range applies only to samples taken after fasting for at least 8 hours.   Calcium, Ion 1.02 (L) 1.15 - 1.40 mmol/L   TCO2 20 (L) 22 - 32 mmol/L   Hemoglobin 8.8 (L) 12.0 - 15.0 g/dL   HCT 26.0 (L) 36.0 - 46.0 %  Lactic acid, plasma     Status: None   Collection Time: 11/05/21  6:06 PM  Result Value Ref Range   Lactic Acid, Venous 1.2 0.5 - 1.9 mmol/L    Comment: Performed at [place] [address] [place], [place] [address]  Urinalysis, Routine w reflex microscopic     Status: Abnormal   Collection Time: 11/05/21  8:11 PM  Result Value Ref Range   Color, Urine ORANGE (A) YELLOW    Comment: BIOCHEMICALS MAY BE AFFECTED BY COLOR   APPearance CLOUDY (A) CLEAR   Specific Gravity, Urine  1.005 - 1.030    TEST NOT REPORTED DUE TO COLOR INTERFERENCE OF URINE PIGMENT   pH  5.0 - 8.0    TEST NOT REPORTED DUE TO COLOR INTERFERENCE OF URINE PIGMENT   Glucose, UA (A) NEGATIVE mg/dL    TEST NOT REPORTED DUE TO COLOR INTERFERENCE OF URINE PIGMENT   Hgb urine dipstick (A) NEGATIVE    TEST NOT REPORTED DUE TO COLOR INTERFERENCE OF URINE PIGMENT   Bilirubin Urine (A) NEGATIVE    TEST NOT REPORTED DUE TO COLOR INTERFERENCE OF URINE PIGMENT   Ketones, ur (A) NEGATIVE mg/dL    TEST NOT REPORTED DUE TO COLOR INTERFERENCE OF URINE PIGMENT   Protein, ur (A) NEGATIVE mg/dL    TEST NOT REPORTED DUE TO COLOR INTERFERENCE OF URINE PIGMENT   Nitrite (A) NEGATIVE    TEST NOT REPORTED DUE TO COLOR INTERFERENCE OF URINE PIGMENT   Leukocytes,Ua (A) NEGATIVE    TEST NOT REPORTED DUE TO COLOR INTERFERENCE OF URINE PIGMENT    Comment: Performed at [facility], [place] [address], [place], [place] [address]  Urinalysis, Microscopic (reflex)     Status: Abnormal   Collection Time:  11/05/21  8:11 PM  Result Value Ref Range  RBC / HPF 0-5 0 - 5 RBC/hpf   WBC, UA 6-10 0 - 5 WBC/hpf   Bacteria, UA RARE (A) NONE SEEN   Squamous Epithelial / LPF 0-5 0 - 5   Trichomonas, UA PRESENT (A) NONE SEEN    Comment: Performed at [facility], [place] [address], [place], [place] [address]  Resp Panel by RT-PCR (Flu A&B, Covid) Nasopharyngeal Swab     Status: None   Collection Time: 11/05/21 11:10 PM   Specimen: Nasopharyngeal Swab; Nasopharyngeal(NP) swabs in vial transport medium  Result Value Ref Range   SARS Coronavirus 2 by RT PCR NEGATIVE NEGATIVE    Comment: (NOTE) SARS-CoV-2 target nucleic acids are NOT DETECTED.  The SARS-CoV-2 RNA is generally detectable in upper respiratory specimens during the acute phase of infection. The lowest concentration of SARS-CoV-2 viral copies this assay can detect is 138 copies/mL. A negative result does not preclude SARS-Cov-2 infection and should not be used as the sole basis for treatment or other patient management decisions. A negative result may occur with  improper specimen collection/handling, submission of specimen other than nasopharyngeal swab, presence of viral mutation(s) within the areas targeted by this assay, and inadequate number of viral copies(<138 copies/mL). A negative result must be combined with clinical observations, patient history, and epidemiological information. The expected result is Negative.  Fact Sheet for Patients:  EntrepreneurPulse.com.au  Fact Sheet for Healthcare Providers:  IncredibleEmployment.be  This test is no t yet approved or cleared by the [place] FDA and  has been authorized for detection and/or diagnosis of SARS-CoV-2 by FDA under an Emergency Use Authorization (EUA). This EUA will remain  in effect (meaning this test can be used) for the duration of the COVID-19 declaration under Section 564(b)(1) of the Act, 21 U.S.C.section  360bbb-3(b)(1), unless the authorization is terminated  or revoked sooner.       Influenza A by PCR NEGATIVE NEGATIVE   Influenza B by PCR NEGATIVE NEGATIVE    Comment: (NOTE) The Xpert Xpress SARS-CoV-2/FLU/RSV plus assay is intended as an aid in the diagnosis of influenza from Nasopharyngeal swab specimens and should not be used as a sole basis for treatment. Nasal washings and aspirates are unacceptable for Xpert Xpress SARS-CoV-2/FLU/RSV testing.  Fact Sheet for Patients: EntrepreneurPulse.com.au  Fact Sheet for Healthcare Providers: IncredibleEmployment.be  This test is not yet approved or cleared by the [place] FDA and has been authorized for detection and/or diagnosis of SARS-CoV-2 by FDA under an Emergency Use Authorization (EUA). This EUA will remain in effect (meaning this test can be used) for the duration of the COVID-19 declaration under Section 564(b)(1) of the Act, 21 U.S.C. section 360bbb-3(b)(1), unless the authorization is terminated or revoked.  Performed at [facility], [place] [address], [place], [place] [address]   CBC     Status: Abnormal   Collection Time: 11/06/21  6:02 AM  Result Value Ref Range   WBC 3.5 (L) 4.0 - 10.5 K/uL   RBC 2.52 (L) 3.87 - 5.11 MIL/uL   Hemoglobin 7.3 (L) 12.0 - 15.0 g/dL   HCT 23.2 (L) 36.0 - 46.0 %   MCV 92.1 80.0 - 100.0 fL   MCH 29.0 26.0 - 34.0 pg   MCHC 31.5 30.0 - 36.0 g/dL   RDW 22.8 (H) 11.5 - 15.5 %   Platelets PLATELET CLUMPS NOTED ON SMEAR, UNABLE TO ESTIMATE 150 - 400 K/uL    Comment: Immature Platelet Fraction may be clinically indicated, consider ordering this  additional test [id number]    nRBC 0.0 0.0 - 0.2 %    Comment: Performed at [facility], [place] [address] [place], [place] [address]  Lactate dehydrogenase     Status: Abnormal   Collection Time: 11/06/21  8:47 AM  Result Value Ref Range   LDH 216 (H) 98 - 192 U/L    Comment: Performed at [facility], [place] [address], [place], [place] [address]   DG Chest 1 View  Result Date: 11/06/2021 CLINICAL DATA:  Status post right thoracentesis. EXAM: CHEST  1 VIEW COMPARISON:  11/05/2021. FINDINGS: Near complete evacuation of the right pleural effusion following thoracentesis. Minimal hazy opacity persists at the right lateral lung base. Remainder of the lungs is clear. No pneumothorax. IMPRESSION: 1. Near complete evacuation of the right pleural effusion following thoracentesis. 2. No pneumothorax. Electronically Signed   By: Lajean Manes M.D.   On: 11/06/2021 11:46   DG Chest 2 View  Result Date: 11/05/2021 CLINICAL DATA:  Chest pain, shortness of breath. EXAM: CHEST - 2 VIEW COMPARISON:  November 02, 2021. FINDINGS: The heart size and mediastinal contours are within normal limits. Left lung is clear. Increased right pleural effusion is noted with associated right basilar atelectasis or infiltrate. The visualized skeletal structures are unremarkable. IMPRESSION: Increased right pleural effusion is noted with associated right basilar atelectasis or infiltrate. Electronically Signed   By: Marijo Conception M.D.   On: 11/05/2021 13:39   CT CHEST ABDOMEN PELVIS W CONTRAST  Result Date: 11/05/2021 CLINICAL DATA:  Worsened right back pain, swelling, recent thoracentesis, thrombocytopenia, abdominal pain EXAM: CT CHEST, ABDOMEN, AND PELVIS WITH CONTRAST TECHNIQUE: Multidetector CT imaging of the chest, abdomen and pelvis was performed following the standard protocol during bolus administration of intravenous contrast. CONTRAST:  9mL OMNIPAQUE IOHEXOL 350 MG/ML SOLN COMPARISON:  08/20/2021, 11/05/2021 FINDINGS: CT CHEST FINDINGS Cardiovascular: The heart and great vessels are unremarkable without pericardial effusion. No evidence of thoracic aortic aneurysm or dissection. Mediastinum/Nodes: No enlarged mediastinal, hilar, or axillary lymph nodes. Thyroid gland, trachea, and esophagus demonstrate no  significant findings. Large gastric and esophageal varices are noted. Lungs/Pleura: Large right pleural effusion volume estimated approximately 2 L. compressive atelectasis within the dependent right lung. Left chest is clear. No pneumothorax. Musculoskeletal: There are no acute or destructive bony lesions. Subcutaneous edema within the lower back and bilateral flanks. Reconstructed images demonstrate no additional findings. CT ABDOMEN PELVIS FINDINGS Hepatobiliary: Nodular shrunken appearance of the liver consistent with cirrhosis. No focal liver abnormality on this single phase exam. Gallbladder is surgically absent. Pancreas: Unremarkable. No pancreatic ductal dilatation or surrounding inflammatory changes. Spleen: The spleen is enlarged, measuring approximately 15.6 cm in craniocaudal length. No focal parenchymal abnormalities. Adrenals/Urinary Tract: Adrenal glands are unremarkable. Kidneys are normal, without renal calculi, focal lesion, or hydronephrosis. Bladder is unremarkable. Stomach/Bowel: No bowel obstruction or ileus. Normal gas-filled appendix right lower quadrant. No bowel wall thickening or inflammatory change. 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 Splenic vein, portal vein, and SMV are patent. No other significant vascular findings. No pathologic adenopathy. Reproductive: Uterus and bilateral adnexa are unremarkable. Other: Small volume ascites. No free intraperitoneal gas. There is diffuse body wall edema. No abdominal wall hernia. Musculoskeletal: No acute or destructive bony lesions. Reconstructed images demonstrate no additional findings. IMPRESSION: 1. Large right pleural effusion volume estimated 2 L. No complication after reported right thoracentesis. 2. Cirrhosis, with portal venous hypertension manifested by large gastric/esophageal varices, splenomegaly, and  small volume ascites. 3. Diffuse body wall edema. Electronically Signed    By: Randa Ngo M.D.   On: 11/05/2021 18:10   US Abdomen Limited  Result Date: 11/06/2021 CLINICAL DATA:  Assess for ascites. EXAM: LIMITED ABDOMEN ULTRASOUND FOR ASCITES TECHNIQUE: Limited ultrasound survey for ascites was performed in all four abdominal quadrants. COMPARISON:  None. FINDINGS: Four quadrant survey of the abdomen shows no significant ascites. IMPRESSION: No significant ascites. Electronically Signed   By: Kerby Moors M.D.   On: 11/06/2021 11:56   US THORACENTESIS ASP PLEURAL SPACE W/IMG GUIDE  Result Date: 11/06/2021 INDICATION: Shortness of breast, history of cirrhosis, pleural effusion seen on previous chest x-ray. Request for therapeutic and diagnostic thoracentesis. EXAM: ULTRASOUND GUIDED RIGHT THORACENTESIS MEDICATIONS: 10 mL 1% lidocaine COMPLICATIONS: None immediate. PROCEDURE: An ultrasound guided thoracentesis was thoroughly discussed with the patient and questions answered. The benefits, risks, alternatives and complications were also discussed. The patient understands and wishes to proceed with the procedure. Written consent was obtained. Ultrasound was performed to localize and mark an adequate pocket of fluid in the right chest. The area was then prepped and draped in the normal sterile fashion. 1% Lidocaine was used for local anesthesia. Under ultrasound guidance a 6 Fr Safe-T-Centesis catheter was introduced. Thoracentesis was performed. The catheter was removed and a dressing applied. FINDINGS: A total of approximately 1,650 cc of hazy, orange colored fluid was removed. Samples were sent to the laboratory as requested by the clinical team. Post procedure chest X-ray reviewed, negative for pneumothorax. IMPRESSION: Successful ultrasound guided right thoracentesis yielding 1,650 cc of pleural fluid. Read by: Durenda Guthrie, PA-C Electronically Signed   By: Jerilynn Mages.  Shick M.D.   On: 11/06/2021 12:42    Pending Labs Unresulted Labs (From admission, onward)     Start     Ordered    11/07/21 0500  CK  Tomorrow morning,   R        11/06/21 1010   11/07/21 0500  Comprehensive metabolic panel  Daily,   R      11/06/21 1056   11/07/21 0500  Magnesium  Tomorrow morning,   R        11/06/21 1056   11/06/21 1700  CBC with Differential/Platelet  5A & 5P,   R      11/06/21 1056   11/06/21 1211  Lactate dehydrogenase (pleural or peritoneal fluid)  RELEASE UPON ORDERING,   STAT        11/06/21 1211   11/06/21 1211  Body fluid cell count with differential  RELEASE UPON ORDERING,   STAT        11/06/21 1211   11/06/21 1211  Gram stain  RELEASE UPON ORDERING,   STAT        11/06/21 1211   11/06/21 1211  Amylase, pleural or peritoneal fluid     RELEASE UPON ORDERING,   STAT        11/06/21 1211   11/06/21 1211  Albumin, pleural or peritoneal fluid   RELEASE UPON ORDERING,   STAT        11/06/21 1211   11/06/21 1211  Protein, pleural or peritoneal fluid  RELEASE UPON ORDERING,   STAT        11/06/21 1211   11/06/21 1211  Body fluid culture w Gram Stain  RELEASE UPON ORDERING,   STAT        11/06/21 1211   11/06/21 1031  Procalcitonin - Baseline  ONCE - STAT,   STAT  11/06/21 1030   11/05/21 1609  Blood culture (routine x 2)  BLOOD CULTURE X 2,   STAT      11/05/21 1608   11/05/21 1350  Urine Culture  Once,   STAT       Question:  Indication  Answer:  Suprapubic pain   11/05/21 1349   Signed and Held  Comprehensive metabolic panel  Tomorrow morning,   R        Signed and Held            Vitals/Pain Today's Vitals   11/06/21 1100 11/06/21 1120 11/06/21 1215 11/06/21 1300  BP: 99/71 (!) 81/34 95/60 (!) 106/55  Pulse:   63 67  Resp:   15 (!) 23  Temp:      TempSrc:      SpO2:   100% 100%  PainSc:        Isolation Precautions No active isolations  Medications Medications  furosemide (LASIX) tablet 40 mg (40 mg Oral Given 11/06/21 0804)  lactulose (CHRONULAC) 10 GM/15ML solution 20 g (20 g Oral Given 07/01/78 1505)  folic acid (FOLVITE) tablet 1 mg (1 mg  Oral Given 11/06/21 0927)  ondansetron (ZOFRAN) tablet 4 mg (has no administration in time range)    Or  ondansetron (ZOFRAN) injection 4 mg (has no administration in time range)  lactated ringers infusion (0 mLs Intravenous Stopped 11/06/21 0631)  midodrine (PROAMATINE) tablet 10 mg (10 mg Oral Given 11/06/21 0927)  metroNIDAZOLE (FLAGYL) tablet 500 mg (500 mg Oral Given 11/06/21 0927)  cefTRIAXone (ROCEPHIN) 2 g in sodium chloride 0.9 % 100 mL IVPB (0 g Intravenous Stopped 11/06/21 1311)  pantoprazole (PROTONIX) injection 40 mg (40 mg Intravenous Given 11/06/21 1208)  sodium chloride 0.9 % bolus 500 mL (0 mLs Intravenous Stopped 11/05/21 1732)  iohexol (OMNIPAQUE) 350 MG/ML injection 80 mL (80 mLs Intravenous Contrast Given 11/05/21 1751)  albumin human 25 % solution 12.5 g (0 g Intravenous Stopped 11/05/21 2232)  sodium chloride 0.9 % bolus 500 mL (0 mLs Intravenous Stopped 11/05/21 1939)  lactated ringers bolus 500 mL (0 mLs Intravenous Stopped 11/05/21 2045)  lactated ringers bolus 1,000 mL (0 mLs Intravenous Stopped 11/06/21 0124)  albumin human 25 % solution 12.5 g (0 g Intravenous Stopped 11/06/21 0311)  albumin human 25 % solution 25 g (0 g Intravenous Stopped 11/06/21 0631)  lidocaine (PF) (XYLOCAINE) 1 % injection (  Given by Other 11/06/21 1142)    Mobility walks Low fall risk   Focused Assessments Pulmonary Assessment Handoff:  Lung sounds:   O2 Device: Room Air      R Recommendations: See Admitting Provider Note  Report given to:   Additional Notes:

## 2021-11-06 NOTE — Procedures (Signed)
PROCEDURE SUMMARY:  Successful image-guided right thoracentesis. Yielded 1,650 cc of hazy orange fluid. Pt tolerated procedure well. No immediate complications. EBL = trace   Specimen was sent for labs. CXR ordered.  Please see imaging section of Epic for full dictation.  Armando Gang Jaciel Diem PA-C 11/06/2021 11:17 AM

## 2021-11-06 NOTE — Discharge Instructions (Addendum)
                  Intensive Outpatient Programs  [facility]    [facility] [address]     [address] [place],  [place]     [place], [place] [phone]      [phone]  [facility]   [facility]  (Inpatient and outpatient)  [phone] (Suboxone and Methadone) [address]           [phone]           [facility]    [facility] - Intensive Outpatient [address]     [address] [place], [place] [address]     [place], [place]  [phone]      [phone]  [facility] (Outpatient, Inpatient, [facility]  [facility] (Groups and Residental) (insurance only) [phone]    [place], [place]          [phone]       [facility]    [facility] (for caregivers and family) [address]     [address] [place], [place]     [place], [place] [phone]      [phone]  Residential Treatment Programs  [facility]  [facility](2 years) Residential: 90 days)  [facility] [address]      [address] [place], [place]     [place], [place] [phone]      [phone] or [phone]  [facility]    [facility] [address]      [address] [place], [place]     [place], [place] [phone]      [phone]  [facility]   [facility] ([facility] [address]     [address] [place], [place] [address]     [place], [place] [phone]      [phone] Admissions: 8am-3pm M-F  [facility]: Residential Program (90 Days)              [facility]: [facility]  [place], [place]     [place],   [phone] or [phone]    (Walk in Hours over the weekend or by referral)   [facility]: [facility]:[phone] (for crisis  response 24 hours a day) 

## 2021-11-06 NOTE — Progress Notes (Signed)
Request received for paracentesis and US abdomen limited.   US Abdomen limited performed by [place] technologist, no ascites seen. Ultrasound images saved for documentation purpose.   Paracentesis canceled, ordering MD notified via secure chat.  Armando Gang Schae Cando PA-C 11/06/2021 11:20 AM

## 2021-11-06 NOTE — ED Notes (Signed)
MD Bridgett Larsson notified of pt's sustained MAP of < 65 and systolic b/p in 16'D despite fluid resus and 2 rounds of albumin.

## 2021-11-07 DIAGNOSIS — I8500 Esophageal varices without bleeding: Secondary | ICD-10-CM

## 2021-11-07 DIAGNOSIS — K769 Liver disease, unspecified: Secondary | ICD-10-CM | POA: Diagnosis not present

## 2021-11-07 DIAGNOSIS — K7031 Alcoholic cirrhosis of liver with ascites: Secondary | ICD-10-CM | POA: Diagnosis not present

## 2021-11-07 DIAGNOSIS — D649 Anemia, unspecified: Secondary | ICD-10-CM | POA: Diagnosis not present

## 2021-11-07 LAB — CBC WITH DIFFERENTIAL/PLATELET
Abs Immature Granulocytes: 0.01 10*3/uL (ref 0.00–0.07)
Abs Immature Granulocytes: 0.01 10*3/uL (ref 0.00–0.07)
Basophils Absolute: 0 10*3/uL (ref 0.0–0.1)
Basophils Absolute: 0 10*3/uL (ref 0.0–0.1)
Basophils Relative: 1 %
Basophils Relative: 1 %
Eosinophils Absolute: 0.1 10*3/uL (ref 0.0–0.5)
Eosinophils Absolute: 0.1 10*3/uL (ref 0.0–0.5)
Eosinophils Relative: 3 %
Eosinophils Relative: 3 %
HCT: 23.8 % — ABNORMAL LOW (ref 36.0–46.0)
HCT: 24.1 % — ABNORMAL LOW (ref 36.0–46.0)
Hemoglobin: 7.3 g/dL — ABNORMAL LOW (ref 12.0–15.0)
Hemoglobin: 7.8 g/dL — ABNORMAL LOW (ref 12.0–15.0)
Immature Granulocytes: 0 %
Immature Granulocytes: 0 %
Lymphocytes Relative: 21 %
Lymphocytes Relative: 19 %
Lymphs Abs: 0.7 10*3/uL (ref 0.7–4.0)
Lymphs Abs: 0.7 10*3/uL (ref 0.7–4.0)
MCH: 28.3 pg (ref 26.0–34.0)
MCH: 29.8 pg (ref 26.0–34.0)
MCHC: 30.7 g/dL (ref 30.0–36.0)
MCHC: 32.4 g/dL (ref 30.0–36.0)
MCV: 92.2 fL (ref 80.0–100.0)
MCV: 92 fL (ref 80.0–100.0)
Monocytes Absolute: 0.3 10*3/uL (ref 0.1–1.0)
Monocytes Absolute: 0.4 10*3/uL (ref 0.1–1.0)
Monocytes Relative: 9 %
Monocytes Relative: 10 %
Neutro Abs: 2.1 10*3/uL (ref 1.7–7.7)
Neutro Abs: 2.4 10*3/uL (ref 1.7–7.7)
Neutrophils Relative %: 66 %
Neutrophils Relative %: 67 %
Platelets: 31 10*3/uL — ABNORMAL LOW (ref 150–400)
Platelets: 34 10*3/uL — ABNORMAL LOW (ref 150–400)
RBC: 2.58 MIL/uL — ABNORMAL LOW (ref 3.87–5.11)
RBC: 2.62 MIL/uL — ABNORMAL LOW (ref 3.87–5.11)
RDW: 22.7 % — ABNORMAL HIGH (ref 11.5–15.5)
RDW: 23 % — ABNORMAL HIGH (ref 11.5–15.5)
WBC: 3.1 10*3/uL — ABNORMAL LOW (ref 4.0–10.5)
WBC: 3.6 10*3/uL — ABNORMAL LOW (ref 4.0–10.5)
nRBC: 0 % (ref 0.0–0.2)
nRBC: 0 % (ref 0.0–0.2)

## 2021-11-07 LAB — COMPREHENSIVE METABOLIC PANEL
ALT: 18 U/L (ref 0–44)
AST: 38 U/L (ref 15–41)
Albumin: 1.9 g/dL — ABNORMAL LOW (ref 3.5–5.0)
Alkaline Phosphatase: 122 U/L (ref 38–126)
Anion gap: 4 — ABNORMAL LOW (ref 5–15)
BUN: 11 mg/dL (ref 6–20)
CO2: 20 mmol/L — ABNORMAL LOW (ref 22–32)
Calcium: 7.8 mg/dL — ABNORMAL LOW (ref 8.9–10.3)
Chloride: 107 mmol/L (ref 98–111)
Creatinine, Ser: 0.82 mg/dL (ref 0.44–1.00)
GFR, Estimated: >60 mL/min (ref >60)
Glucose, Bld: 92 mg/dL (ref 70–99)
Potassium: 4.1 mmol/L (ref 3.5–5.1)
Sodium: 131 mmol/L — ABNORMAL LOW (ref 135–145)
Total Bilirubin: 11.9 mg/dL — ABNORMAL HIGH (ref 0.3–1.2)
Total Protein: 4.5 g/dL — ABNORMAL LOW (ref 6.5–8.1)

## 2021-11-07 LAB — CBC
HCT: 26 % — ABNORMAL LOW (ref 36.0–46.0)
Hemoglobin: 7.9 g/dL — ABNORMAL LOW (ref 12.0–15.0)
MCH: 28.6 pg (ref 26.0–34.0)
MCHC: 30.4 g/dL (ref 30.0–36.0)
MCV: 94.2 fL (ref 80.0–100.0)
Platelets: 39 10*3/uL — ABNORMAL LOW (ref 150–400)
RBC: 2.76 MIL/uL — ABNORMAL LOW (ref 3.87–5.11)
RDW: 23 % — ABNORMAL HIGH (ref 11.5–15.5)
WBC: 4 10*3/uL (ref 4.0–10.5)
nRBC: 0.5 % — ABNORMAL HIGH (ref 0.0–0.2)

## 2021-11-07 LAB — URINE CULTURE: Culture: NO GROWTH

## 2021-11-07 LAB — MAGNESIUM: Magnesium: 1.9 mg/dL (ref 1.7–2.4)

## 2021-11-07 MED ORDER — LACTULOSE 10 GM/15ML PO SOLN: 20.0000 g | Freq: Every day | ORAL | Status: DC

## 2021-11-07 MED ORDER — LACTULOSE 10 GM/15ML PO SOLN
20.0000 g | Freq: Every day | ORAL | Status: DC
  Administered 2021-11-08 – 2021-11-11 (×4): 20 g via ORAL
  Filled 2021-11-07 (×4): qty 30

## 2021-11-07 MED ORDER — ACETAMINOPHEN 500 MG PO TABS: 500.0000 mg | ORAL_TABLET | Freq: Three times a day (TID) | ORAL | Status: DC | PRN

## 2021-11-07 MED ORDER — HYDROMORPHONE HCL 1 MG/ML IJ SOLN: 0.5000 mg | Freq: Two times a day (BID) | INTRAMUSCULAR | Status: DC | PRN

## 2021-11-07 MED ORDER — ACETAMINOPHEN 325 MG PO TABS: 650.0000 mg | ORAL_TABLET | Freq: Three times a day (TID) | ORAL | Status: DC | PRN

## 2021-11-07 MED ORDER — HYDROMORPHONE HCL 1 MG/ML IJ SOLN: 0.5000 mg | Freq: Three times a day (TID) | INTRAMUSCULAR | Status: DC | PRN

## 2021-11-07 MED ORDER — FUROSEMIDE 40 MG PO TABS
40.0000 mg | ORAL_TABLET | Freq: Every day | ORAL | Status: DC
  Administered 2021-11-08 – 2021-11-09 (×2): 40 mg via ORAL
  Filled 2021-11-07 (×2): qty 1

## 2021-11-07 MED ORDER — CIPROFLOXACIN HCL 500 MG PO TABS
500.0000 mg | ORAL_TABLET | Freq: Every day | ORAL | Status: DC
  Administered 2021-11-07 – 2021-11-11 (×5): 500 mg via ORAL
  Filled 2021-11-07 (×5): qty 1

## 2021-11-07 MED ORDER — SPIRONOLACTONE 25 MG PO TABS
100.0000 mg | ORAL_TABLET | Freq: Every day | ORAL | Status: DC
  Administered 2021-11-07 – 2021-11-09 (×3): 100 mg via ORAL
  Filled 2021-11-07 (×3): qty 4

## 2021-11-07 MED ORDER — HYDROMORPHONE HCL 1 MG/ML IJ SOLN: 0.5000 mg | INTRAMUSCULAR | Status: DC | PRN

## 2021-11-07 NOTE — Progress Notes (Signed)
PROGRESS NOTE    Victoria Gordon  OZH:[id number] DOB: 1981/06/01 DOA: 11/05/2021 PCP: Patient, [name]   Brief Narrative:  Victoria Gordon is a 41 y.o. female with medical history significant of Alcohol abuse, Alcoholic cirrhosis of liver with ascites, Anemia, Anxiety, Hemorrhoid, Hepatitis B, Major depressive disorder, PTSD, and Substance abuse who was discharged from [facility] on 11/03/2021 after having thoracentesis for pleural effusion.  During her hospitalization she received 1 unit of packed red blood cells for anemia with hemoglobin of 6.9.  GI was not consulted.  She was discharged home since she was stable.  She returned to ED with epigastric right upper quadrant abdominal pain as well as pain in her right lateral chest wall with increased shortness of breath.  She also had nausea she reports having black tarry stools for the last few weeks along with rectal bright red bleed.  She does admit to having a occasional periods of confusion which she attributes to her liver disease. She has not had any fever or chills and has not had no known sick contacts. Has had decreased appetite the last 2 days.  She has jaundice of her skin which is unchanged for the past few weeks she states.  She describes the pain in her abdomen and side is an aching pain that does not radiate.    In the emergency room he has had soft blood pressure in the 80-100/43-56 range, pulse 50-60 respirations 15-27.  She has been afebrile.  She was started on cefepime and daptomycin for possible early sepsis with bradycardia and low blood pressures below 90 systolically..  She does have history of bacteremia.  There is risk of spontaneous bacterial peritonitis  Lactic acid level 0.9, WBC 5100 hemoglobin 9. Patient was given albumin infusion in the emergency room.  Hospital service was asked to admit for further management  Assessment & Plan:   Principal Problem:   Pleural effusion associated with  hepatic disorder Active Problems:   Thrombocytopenia (HCC)   Hyperammonemia (HCC)   Alcoholic cirrhosis of liver with ascites (HCC)   Anemia   Hypotension   Hyperbilirubinemia   Esophageal varices without bleeding ([place])  Large right-sided pleural effusion/hydrothorax: This is postthoracentesis with removal of 1600 cc fluid yesterday, patient feels better since then.  Alcoholic cirrhosis of liver with ascites/possible SBP: Ultrasound abdomen did not show ascites.  No suspicion of SBP.  GI today has recommended resuming her back on home oral antibiotics for SBP prophylaxis.  Per GI, Her cirrhosis is quite decompensated, MELD-Na 33 on arrival - large varices / portal hypertensive gastritis, chronic anemia, jaundice, hepatic encephalopathy, history of SBP, coagulopathic / thrombocytopenia. 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 Given her MELD / jaundice / hepatic encephalopathy she is not a good candidate for TIPS to manage her effusion, at least right now. In this light, management of hepatic hydrothorax would be to maximize her diuretics, as her renal function is fortunately normal but her blood pressure which is borderline low is limiting [place].  We will continue Lasix 40 mg oral twice daily and Aldactone is added by GI, will hold if SBP less than 90.  Melena/bright red blood per rectum: Acute blood loss anemia with known history of esophageal varices: Her hemoglobin was 8.4 when she left [facility] on 11/03/2021, now dropped to 7.3 and stable.  Per GI, no plans for EGD or colonoscopy since her INR is high and platelets are very low.  She  is high bleeding risk.  She is likely bleeding from hemorrhoids per GI.  Hyperammonemia: Elevated but this is her baseline.   Hypotension: Blood pressure better than yesterday.  Monitor with midodrine on board.   Chronic pancytopenia: Likely secondary to marrow suppression due to chronic alcoholism.  Platelets at her baseline.  Avoid  heparin products.   Hyperbilirubinemia Chronic and stable.  Secondary to cirrhosis.  Trichomonas vaginalis: Her UA is positive with trichomonas.  Continue Flagyl.  DVT prophylaxis: SCDs Start: 11/06/21 0602   Code Status: Full Code  Family Communication:  None present at bedside.  Plan of care discussed with patient in length and he/she verbalized understanding and agreed with it.  Status is: Inpatient  Remains inpatient appropriate because: She is very sick.  Estimated body mass index is 36.99 kg/m as calculated from the following:   Height as of this encounter: 5\' 1"  (1.549 m).   Weight as of this encounter: 88.8 kg.    Nutritional Assessment: Body mass index is 36.99 kg/m.Marland Kitchen Seen by dietician.  I agree with the assessment and plan as outlined below: Nutrition Status:    Skin Assessment: I have examined the patient's skin and I agree with the wound assessment as performed by the wound care RN as outlined below:    Consultants:  GI IR  Procedures:  None  Antimicrobials:  Anti-infectives (From admission, onward)    Start     Dose/Rate Route Frequency Ordered Stop   11/07/21 1045  ciprofloxacin (CIPRO) tablet 500 mg        500 mg Oral Daily with breakfast 11/07/21 0952     11/06/21 1230  cefTRIAXone (ROCEPHIN) 2 g in sodium chloride 0.9 % 100 mL IVPB  Status:  Discontinued        2 g 200 mL/hr over 30 Minutes Intravenous Every 24 hours 11/06/21 1023 11/06/21 1030   11/06/21 1045  cefTRIAXone (ROCEPHIN) 2 g in sodium chloride 0.9 % 100 mL IVPB  Status:  Discontinued        2 g 200 mL/hr over 30 Minutes Intravenous Every 24 hours 11/06/21 1038 11/07/21 0952   11/06/21 1000  metroNIDAZOLE (FLAGYL) tablet 500 mg        500 mg Oral Every 12 hours 11/06/21 0852 11/13/21 0959   11/05/21 2200  linezolid (ZYVOX) IVPB 600 mg  Status:  Discontinued        600 mg 300 mL/hr over 60 Minutes Intravenous Every 12 hours 11/05/21 1749 11/05/21 1800   11/05/21 1815  DAPTOmycin  (CUBICIN) 700 mg in sodium chloride 0.9 % IVPB  Status:  Discontinued        8 mg/kg  88.8 kg 128 mL/hr over 30 Minutes Intravenous Daily 11/05/21 1808 11/06/21 1023   11/05/21 1745  ceFEPIme (MAXIPIME) 2 g in sodium chloride 0.9 % 100 mL IVPB  Status:  Discontinued        2 g 200 mL/hr over 30 Minutes Intravenous Every 8 hours 11/05/21 1739 11/06/21 1023         Subjective: Seen and examined.  Feels better than yesterday.  Some abdominal pain.  No nausea.  Objective: Vitals:   11/06/21 1746 11/06/21 1954 11/06/21 2158 11/07/21 0447  BP: 107/61 106/60 106/60 (!) 92/55  Pulse:  60 60 61  Resp: 16 14 14 19   Temp:  98.6 F (37 C) 98.7 F (37.1 C) 98.6 F (37 C)  TempSrc:  Oral  Oral  SpO2:  99%  98%  Weight:   88.8  kg   Height:   5\' 1"  (1.549 m)     Intake/Output Summary (Last 24 hours) at 11/07/2021 1021 Last data filed at 11/06/2021 1502 Gross per 24 hour  Intake 461.53 ml  Output --  Net 461.53 ml    Filed Weights   11/06/21 2158  Weight: 88.8 kg    Examination:  General exam: Appears calm and comfortable  Respiratory system: Reduced breath sound in the right base.Marland Kitchen Respiratory effort normal. Cardiovascular system: S1 & S2 heard, RRR. No JVD, murmurs, rubs, gallops or clicks. No pedal edema. Gastrointestinal system: Abdomen is nondistended, soft and mild generalized tenderness. No organomegaly or masses felt. Normal bowel sounds heard. Central nervous system: Alert and oriented. No focal neurological deficits. Extremities: Symmetric 5 x 5 power. Skin: No rashes, lesions or ulcers.  Psychiatry: Judgement and insight appear normal. Mood & affect appropriate.   Data Reviewed: I have personally reviewed following labs and imaging studies  CBC: Recent Labs  Lab 11/03/21 0611 11/03/21 1356 11/05/21 1314 11/05/21 1727 11/06/21 0602 11/06/21 1640 11/07/21 0541  WBC 3.7*  --  5.1  --  3.5* 3.0* 3.1*  NEUTROABS  --   --  3.4  --   --  1.9 2.1  HGB 7.7*   < >  9.0* 8.8* 7.3* 7.2* 7.3*  HCT 24.6*   < > 28.8* 26.0* 23.2* 23.5* 23.8*  MCV 90.1  --  92.9  --  92.1 91.8 92.2  PLT 30*  --  38*  --  PLATELET CLUMPS NOTED ON SMEAR, UNABLE TO ESTIMATE 31* 31*   < > = values in this interval not displayed.    Basic Metabolic Panel: Recent Labs  Lab 11/01/21 1331 11/02/21 0523 11/03/21 0611 11/05/21 1314 11/05/21 1727 11/07/21 0541  NA 132* 133* 132* 127* 132* 131*  K 4.1 3.9 3.5 4.8 4.3 4.1  CL 106 108 106 103 103 107  CO2 20* 18* 20* 20*  --  20*  GLUCOSE 128* 83 99 106* 82 92  BUN 13 13 12 8 7 11   CREATININE 0.81 0.69 0.71 0.80 0.80 0.82  CALCIUM 7.5* 7.5* 7.4* 7.4*  --  7.8*  MG  --   --   --   --   --  1.9    GFR: Estimated Creatinine Clearance: 94.3 mL/min (by C-G formula based on SCr of 0.82 mg/dL). Liver Function Tests: Recent Labs  Lab 11/01/21 1331 11/02/21 0523 11/03/21 0611 11/05/21 1314 11/07/21 0541  AST 51* 39 41 65* 38  ALT 23 20 22 21 18   ALKPHOS 123 110 106 150* 122  BILITOT 14.4* 11.2* 11.9* 14.4* 11.9*  PROT 5.6* 4.5* 4.6* 5.4* 4.5*  ALBUMIN 2.0* 1.6* 1.7* 1.8* 1.9*    Recent Labs  Lab 11/01/21 1331 11/05/21 1500  LIPASE 66* 60*    Recent Labs  Lab 11/05/21 1314  AMMONIA 49*    Coagulation Profile: Recent Labs  Lab 11/01/21 1331 11/05/21 1314  INR 2.9* 3.1*    Cardiac Enzymes: No results for input(s): CKTOTAL, CKMB, CKMBINDEX, TROPONINI in the last 168 hours. BNP (last 3 results) No results for input(s): PROBNP in the last 8760 hours. HbA1C: No results for input(s): HGBA1C in the last 72 hours. CBG: No results for input(s): GLUCAP in the last 168 hours. Lipid Profile: No results for input(s): CHOL, HDL, LDLCALC, TRIG, CHOLHDL, LDLDIRECT in the last 72 hours. Thyroid Function Tests: No results for input(s): TSH, T4TOTAL, FREET4, T3FREE, THYROIDAB in the last 72 hours. Anemia Panel:  No results for input(s): VITAMINB12, FOLATE, FERRITIN, TIBC, IRON, RETICCTPCT in the last 72  hours. Sepsis Labs: Recent Labs  Lab 11/05/21 1606 11/05/21 1806 11/06/21 0847  PROCALCITON  --   --  <0.10  LATICACIDVEN 0.9 1.2  --      Recent Results (from the past 240 hour(s))  Resp Panel by RT-PCR (Flu A&B, Covid) Nasopharyngeal Swab     Status: None   Collection Time: 11/01/21  5:30 PM   Specimen: Nasopharyngeal Swab; Nasopharyngeal(NP) swabs in vial transport medium  Result Value Ref Range Status   SARS Coronavirus 2 by RT PCR NEGATIVE NEGATIVE Final    Comment: (NOTE) SARS-CoV-2 target nucleic acids are NOT DETECTED.  The SARS-CoV-2 RNA is generally detectable in upper respiratory specimens during the acute phase of infection. The lowest concentration of SARS-CoV-2 viral copies this assay can detect is 138 copies/mL. A negative result does not preclude SARS-Cov-2 infection and should not be used as the sole basis for treatment or other patient management decisions. A negative result may occur with  improper specimen collection/handling, submission of specimen other than nasopharyngeal swab, presence of viral mutation(s) within the areas targeted by this assay, and inadequate number of viral copies(<138 copies/mL). A negative result must be combined with clinical observations, patient history, and epidemiological information. The expected result is Negative.  Fact Sheet for Patients:  EntrepreneurPulse.com.au  Fact Sheet for Healthcare Providers:  IncredibleEmployment.be  This test is no t yet approved or cleared by the [place] FDA and  has been authorized for detection and/or diagnosis of SARS-CoV-2 by FDA under an Emergency Use Authorization (EUA). This EUA will remain  in effect (meaning this test can be used) for the duration of the COVID-19 declaration under Section 564(b)(1) of the Act, 21 U.S.C.section 360bbb-3(b)(1), unless the authorization is terminated  or revoked sooner.       Influenza A by PCR NEGATIVE  NEGATIVE Final   Influenza B by PCR NEGATIVE NEGATIVE Final    Comment: (NOTE) The Xpert Xpress SARS-CoV-2/FLU/RSV plus assay is intended as an aid in the diagnosis of influenza from Nasopharyngeal swab specimens and should not be used as a sole basis for treatment. Nasal washings and aspirates are unacceptable for Xpert Xpress SARS-CoV-2/FLU/RSV testing.  Fact Sheet for Patients: EntrepreneurPulse.com.au  Fact Sheet for Healthcare Providers: IncredibleEmployment.be  This test is not yet approved or cleared by the [place] FDA and has been authorized for detection and/or diagnosis of SARS-CoV-2 by FDA under an Emergency Use Authorization (EUA). This EUA will remain in effect (meaning this test can be used) for the duration of the COVID-19 declaration under Section 564(b)(1) of the Act, 21 U.S.C. section 360bbb-3(b)(1), unless the authorization is terminated or revoked.  Performed at [facility], [address], [place], [place] [address]   Gram stain     Status: None   Collection Time: 11/02/21 10:35 AM   Specimen: Pleura  Result Value Ref Range Status   Specimen Description PLEURAL  Final   Special Requests NONE  Final   Gram Stain   Final    NO ORGANISMS SEEN WBC PRESENT,BOTH PMN AND MONONUCLEAR CYTOSPIN SMEAR Performed at [facility], [address], [place], [place] [address]    Report Status 11/02/2021 FINAL  Final  Culture, body fluid w Gram Stain-bottle     Status: None   Collection Time: 11/02/21 10:35 AM   Specimen: Pleura  Result Value Ref Range Status   Specimen Description PLEURAL  Final   Special Requests  BOTTLES DRAWN AEROBIC AND ANAEROBIC 10 CC  Final   Gram Stain   Final    NO ORGANISMS SEEN WBC PRESENT,BOTH PMN AND MONONUCLEAR CYTOSPIN SMEAR    Culture   Final    NO GROWTH < 12 HOURS Performed at [facility], [address] [place], [place] [address]    Report Status 11/02/2021 FINAL  Final  Blood culture  (routine x 2)     Status: None (Preliminary result)   Collection Time: 11/05/21  4:09 PM   Specimen: BLOOD  Result Value Ref Range Status   Specimen Description BLOOD SITE NOT SPECIFIED  Final   Special Requests   Final    BOTTLES DRAWN AEROBIC AND ANAEROBIC Blood Culture adequate volume   Culture   Final    NO GROWTH 2 DAYS Performed at [facility], [place] [address] [place], [place] [address]    Report Status PENDING  Incomplete  Resp Panel by RT-PCR (Flu A&B, Covid) Nasopharyngeal Swab     Status: None   Collection Time: 11/05/21 11:10 PM   Specimen: Nasopharyngeal Swab; Nasopharyngeal(NP) swabs in vial transport medium  Result Value Ref Range Status   SARS Coronavirus 2 by RT PCR NEGATIVE NEGATIVE Final    Comment: (NOTE) SARS-CoV-2 target nucleic acids are NOT DETECTED.  The SARS-CoV-2 RNA is generally detectable in upper respiratory specimens during the acute phase of infection. The lowest concentration of SARS-CoV-2 viral copies this assay can detect is 138 copies/mL. A negative result does not preclude SARS-Cov-2 infection and should not be used as the sole basis for treatment or other patient management decisions. A negative result may occur with  improper specimen collection/handling, submission of specimen other than nasopharyngeal swab, presence of viral mutation(s) within the areas targeted by this assay, and inadequate number of viral copies(<138 copies/mL). A negative result must be combined with clinical observations, patient history, and epidemiological information. The expected result is Negative.  Fact Sheet for Patients:  EntrepreneurPulse.com.au  Fact Sheet for Healthcare Providers:  IncredibleEmployment.be  This test is no t yet approved or cleared by the [place] FDA and  has been authorized for detection and/or diagnosis of SARS-CoV-2 by FDA under an Emergency Use Authorization (EUA). This EUA will remain   in effect (meaning this test can be used) for the duration of the COVID-19 declaration under Section 564(b)(1) of the Act, 21 U.S.C.section 360bbb-3(b)(1), unless the authorization is terminated  or revoked sooner.       Influenza A by PCR NEGATIVE NEGATIVE Final   Influenza B by PCR NEGATIVE NEGATIVE Final    Comment: (NOTE) The Xpert Xpress SARS-CoV-2/FLU/RSV plus assay is intended as an aid in the diagnosis of influenza from Nasopharyngeal swab specimens and should not be used as a sole basis for treatment. Nasal washings and aspirates are unacceptable for Xpert Xpress SARS-CoV-2/FLU/RSV testing.  Fact Sheet for Patients: EntrepreneurPulse.com.au  Fact Sheet for Healthcare Providers: IncredibleEmployment.be  This test is not yet approved or cleared by the [place] FDA and has been authorized for detection and/or diagnosis of SARS-CoV-2 by FDA under an Emergency Use Authorization (EUA). This EUA will remain in effect (meaning this test can be used) for the duration of the COVID-19 declaration under Section 564(b)(1) of the Act, 21 U.S.C. section 360bbb-3(b)(1), unless the authorization is terminated or revoked.  Performed at [facility], [place] [address] [place], [place] [address]   Culture, body fluid w Gram Stain-bottle     Status: None (Preliminary result)  Collection Time: 11/06/21 12:08 PM   Specimen: Pleura  Result Value Ref Range Status   Specimen Description PLEURAL RIGHT  Final   Special Requests NONE  Final   Culture   Final    NO GROWTH < 24 HOURS Performed at [facility], [place] [address], [place], [place] [address]    Report Status PENDING  Incomplete  Gram stain     Status: None   Collection Time: 11/06/21 12:08 PM   Specimen: Pleura  Result Value Ref Range Status   Specimen Description PLEURAL RIGHT  Final   Special Requests NONE  Final   Gram Stain   Final    WBC PRESENT,BOTH PMN AND  MONONUCLEAR NO ORGANISMS SEEN CYTOSPIN SMEAR Performed at [facility], [address], [place], [place] [address]    Report Status 11/06/2021 FINAL  Final      Radiology Studies: DG Chest 1 View  Result Date: 11/06/2021 CLINICAL DATA:  Status post right thoracentesis. EXAM: CHEST  1 VIEW COMPARISON:  11/05/2021. FINDINGS: Near complete evacuation of the right pleural effusion following thoracentesis. Minimal hazy opacity persists at the right lateral lung base. Remainder of the lungs is clear. No pneumothorax. IMPRESSION: 1. Near complete evacuation of the right pleural effusion following thoracentesis. 2. No pneumothorax. Electronically Signed   By: Lajean Manes M.D.   On: 11/06/2021 11:46   DG Chest 2 View  Result Date: 11/05/2021 CLINICAL DATA:  Chest pain, shortness of breath. EXAM: CHEST - 2 VIEW COMPARISON:  November 02, 2021. FINDINGS: The heart size and mediastinal contours are within normal limits. Left lung is clear. Increased right pleural effusion is noted with associated right basilar atelectasis or infiltrate. The visualized skeletal structures are unremarkable. IMPRESSION: Increased right pleural effusion is noted with associated right basilar atelectasis or infiltrate. Electronically Signed   By: Marijo Conception M.D.   On: 11/05/2021 13:39   CT CHEST ABDOMEN PELVIS W CONTRAST  Result Date: 11/05/2021 CLINICAL DATA:  Worsened right back pain, swelling, recent thoracentesis, thrombocytopenia, abdominal pain EXAM: CT CHEST, ABDOMEN, AND PELVIS WITH CONTRAST TECHNIQUE: Multidetector CT imaging of the chest, abdomen and pelvis was performed following the standard protocol during bolus administration of intravenous contrast. CONTRAST:  29mL OMNIPAQUE IOHEXOL 350 MG/ML SOLN COMPARISON:  08/20/2021, 11/05/2021 FINDINGS: CT CHEST FINDINGS Cardiovascular: The heart and great vessels are unremarkable without pericardial effusion. No evidence of thoracic aortic aneurysm or dissection.  Mediastinum/Nodes: No enlarged mediastinal, hilar, or axillary lymph nodes. Thyroid gland, trachea, and esophagus demonstrate no significant findings. Large gastric and esophageal varices are noted. Lungs/Pleura: Large right pleural effusion volume estimated approximately 2 L. compressive atelectasis within the dependent right lung. Left chest is clear. No pneumothorax. Musculoskeletal: There are no acute or destructive bony lesions. Subcutaneous edema within the lower back and bilateral flanks. Reconstructed images demonstrate no additional findings. CT ABDOMEN PELVIS FINDINGS Hepatobiliary: Nodular shrunken appearance of the liver consistent with cirrhosis. No focal liver abnormality on this single phase exam. Gallbladder is surgically absent. Pancreas: Unremarkable. No pancreatic ductal dilatation or surrounding inflammatory changes. Spleen: The spleen is enlarged, measuring approximately 15.6 cm in craniocaudal length. No focal parenchymal abnormalities. Adrenals/Urinary Tract: Adrenal glands are unremarkable. Kidneys are normal, without renal calculi, focal lesion, or hydronephrosis. Bladder is unremarkable. Stomach/Bowel: No bowel obstruction or ileus. Normal gas-filled appendix right lower quadrant. No bowel wall thickening or inflammatory change. 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 Splenic vein, portal vein, and SMV are  patent. No other significant vascular findings. No pathologic adenopathy. Reproductive: Uterus and bilateral adnexa are unremarkable. Other: Small volume ascites. No free intraperitoneal gas. There is diffuse body wall edema. No abdominal wall hernia. Musculoskeletal: No acute or destructive bony lesions. Reconstructed images demonstrate no additional findings. IMPRESSION: 1. Large right pleural effusion volume estimated 2 L. No complication after reported right thoracentesis. 2. Cirrhosis, with portal venous hypertension  manifested by large gastric/esophageal varices, splenomegaly, and small volume ascites. 3. Diffuse body wall edema. Electronically Signed   By: Randa Ngo M.D.   On: 11/05/2021 18:10   US Abdomen Limited  Result Date: 11/06/2021 CLINICAL DATA:  Assess for ascites. EXAM: LIMITED ABDOMEN ULTRASOUND FOR ASCITES TECHNIQUE: Limited ultrasound survey for ascites was performed in all four abdominal quadrants. COMPARISON:  None. FINDINGS: Four quadrant survey of the abdomen shows no significant ascites. IMPRESSION: No significant ascites. Electronically Signed   By: Kerby Moors M.D.   On: 11/06/2021 11:56   US THORACENTESIS ASP PLEURAL SPACE W/IMG GUIDE  Result Date: 11/06/2021 INDICATION: Shortness of breast, history of cirrhosis, pleural effusion seen on previous chest x-ray. Request for therapeutic and diagnostic thoracentesis. EXAM: ULTRASOUND GUIDED RIGHT THORACENTESIS MEDICATIONS: 10 mL 1% lidocaine COMPLICATIONS: None immediate. PROCEDURE: An ultrasound guided thoracentesis was thoroughly discussed with the patient and questions answered. The benefits, risks, alternatives and complications were also discussed. The patient understands and wishes to proceed with the procedure. Written consent was obtained. Ultrasound was performed to localize and mark an adequate pocket of fluid in the right chest. The area was then prepped and draped in the normal sterile fashion. 1% Lidocaine was used for local anesthesia. Under ultrasound guidance a 6 Fr Safe-T-Centesis catheter was introduced. Thoracentesis was performed. The catheter was removed and a dressing applied. FINDINGS: A total of approximately 1,650 cc of hazy, orange colored fluid was removed. Samples were sent to the laboratory as requested by the clinical team. Post procedure chest X-ray reviewed, negative for pneumothorax. IMPRESSION: Successful ultrasound guided right thoracentesis yielding 1,650 cc of pleural fluid. Read by: Durenda Guthrie, PA-C  Electronically Signed   By: Jerilynn Mages.  Shick M.D.   On: 11/06/2021 12:42    Scheduled Meds:  ciprofloxacin  500 mg Oral Q breakfast   folic acid  1 mg Oral Daily   furosemide  40 mg Oral BID   lactulose  20 g Oral BID   metroNIDAZOLE  500 mg Oral Q12H   midodrine  10 mg Oral TID   pantoprazole (PROTONIX) IV  40 mg Intravenous Q12H   Continuous Infusions:     LOS: 2 days   Time spent: 30 minutes  Darliss Cheney, MD [facility]  11/07/2021, 10:21 AM  Please page via [place] [place] and do not message via secure chat for anything urgent. Secure chat can be used for anything non urgent.  How to contact the [facility] Attending or Consulting provider [place] or covering provider during after hours [place], for this patient?  Check the care team in [facility] and look for a) attending/consulting [facility] provider listed and b) the [facility] team listed. Page or secure chat [phone]. Log into [email or web address] and use [facility]'s universal password to access. If you do not have the password, please contact the hospital operator. Locate the [facility] provider you are looking for under [facility] and page to a number that you can be directly reached. If you still have difficulty reaching the provider, please page the [facility] (Director on Call) for the Hospitalists listed on  amion for assistance.

## 2021-11-07 NOTE — Progress Notes (Signed)
Late entry: Patient complaining of stomach and epigastric pain this am.I reached out to MD and orders received for hydromorphone. By the time I was getting in the room to give it to patient, GI MD was in there and stated not to give it, to hold lactulose for this am dose as this is probably what her pain is coming from. Patient understood and will follow up as day progresses to assess pain status

## 2021-11-07 NOTE — Progress Notes (Signed)
Progress Note   Subjective  Patient is s/p thoracentesis yesterday, about 1600cc removed. She is breathing much better. She has had some abdominal discomfort with diarrhea, given lactulose yesterday. Stools are brown, scant red blood from hemorrhoids, no melena. Mental status is sharp. BP has fluctuated yesterday but in high 85I systolic on rounds this AM.   Objective   Vital signs in last 24 hours: Temp:  [98.2 F (36.8 C)-98.7 F (37.1 C)] 98.6 F (37 C) (01/08 0447) Pulse Rate:  [60-67] 61 (01/08 0447) Resp:  [14-23] 19 (01/08 0447) BP: (81-107)/(34-71) 92/55 (01/08 0447) SpO2:  [98 %-100 %] 98 % (01/08 0447) Weight:  [88.8 kg] 88.8 kg (01/07 2158) Last BM Date: 11/07/21 General:    female in NAD, jaundiced Heart:  Regular rate and rhythm; Lungs: Respirations even and unlabored, decreased BS in R base but considerably improved compared to yesterday Extremities:  (+)1 LE edema. Neurologic:  Alert and oriented,  grossly normal neurologically. Psych:  Cooperative. Normal mood and affect.  Intake/Output from previous day: 01/07 0701 - 01/08 0700 In: 561.5 [P.O.:358; IV Piggyback:203.5] Out: 400 [Urine:400] Intake/Output this shift: No intake/output data recorded.  Lab Results: Recent Labs    11/06/21 0602 11/06/21 1640 11/07/21 0541  WBC 3.5* 3.0* 3.1*  HGB 7.3* 7.2* 7.3*  HCT 23.2* 23.5* 23.8*  PLT PLATELET CLUMPS NOTED ON SMEAR, UNABLE TO ESTIMATE 31* 31*   BMET Recent Labs    11/05/21 1314 11/05/21 1727 11/07/21 0541  NA 127* 132* 131*  K 4.8 4.3 4.1  CL 103 103 107  CO2 20*  --  20*  GLUCOSE 106* 82 92  BUN 8 7 11   CREATININE 0.80 0.80 0.82  CALCIUM 7.4*  --  7.8*   LFT Recent Labs    11/07/21 0541  PROT 4.5*  ALBUMIN 1.9*  AST 38  ALT 18  ALKPHOS 122  BILITOT 11.9*   PT/INR Recent Labs    11/05/21 1314  LABPROT 31.7*  INR 3.1*    Studies/Results: DG Chest 1 View  Result Date: 11/06/2021 CLINICAL DATA:  Status post right  thoracentesis. EXAM: CHEST  1 VIEW COMPARISON:  11/05/2021. FINDINGS: Near complete evacuation of the right pleural effusion following thoracentesis. Minimal hazy opacity persists at the right lateral lung base. Remainder of the lungs is clear. No pneumothorax. IMPRESSION: 1. Near complete evacuation of the right pleural effusion following thoracentesis. 2. No pneumothorax. Electronically Signed   By: Lajean Manes M.D.   On: 11/06/2021 11:46   DG Chest 2 View  Result Date: 11/05/2021 CLINICAL DATA:  Chest pain, shortness of breath. EXAM: CHEST - 2 VIEW COMPARISON:  November 02, 2021. FINDINGS: The heart size and mediastinal contours are within normal limits. Left lung is clear. Increased right pleural effusion is noted with associated right basilar atelectasis or infiltrate. The visualized skeletal structures are unremarkable. IMPRESSION: Increased right pleural effusion is noted with associated right basilar atelectasis or infiltrate. Electronically Signed   By: Marijo Conception M.D.   On: 11/05/2021 13:39   CT CHEST ABDOMEN PELVIS W CONTRAST  Result Date: 11/05/2021 CLINICAL DATA:  Worsened right back pain, swelling, recent thoracentesis, thrombocytopenia, abdominal pain EXAM: CT CHEST, ABDOMEN, AND PELVIS WITH CONTRAST TECHNIQUE: Multidetector CT imaging of the chest, abdomen and pelvis was performed following the standard protocol during bolus administration of intravenous contrast. CONTRAST:  20mL OMNIPAQUE IOHEXOL 350 MG/ML SOLN COMPARISON:  08/20/2021, 11/05/2021 FINDINGS: CT CHEST FINDINGS Cardiovascular: The heart and great vessels are  unremarkable without pericardial effusion. No evidence of thoracic aortic aneurysm or dissection. Mediastinum/Nodes: No enlarged mediastinal, hilar, or axillary lymph nodes. Thyroid gland, trachea, and esophagus demonstrate no significant findings. Large gastric and esophageal varices are noted. Lungs/Pleura: Large right pleural effusion volume estimated approximately 2  L. compressive atelectasis within the dependent right lung. Left chest is clear. No pneumothorax. Musculoskeletal: There are no acute or destructive bony lesions. Subcutaneous edema within the lower back and bilateral flanks. Reconstructed images demonstrate no additional findings. CT ABDOMEN PELVIS FINDINGS Hepatobiliary: Nodular shrunken appearance of the liver consistent with cirrhosis. No focal liver abnormality on this single phase exam. Gallbladder is surgically absent. Pancreas: Unremarkable. No pancreatic ductal dilatation or surrounding inflammatory changes. Spleen: The spleen is enlarged, measuring approximately 15.6 cm in craniocaudal length. No focal parenchymal abnormalities. Adrenals/Urinary Tract: Adrenal glands are unremarkable. Kidneys are normal, without renal calculi, focal lesion, or hydronephrosis. Bladder is unremarkable. Stomach/Bowel: No bowel obstruction or ileus. Normal gas-filled appendix right lower quadrant. No bowel wall thickening or inflammatory change. 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 Splenic vein, portal vein, and SMV are patent. No other significant vascular findings. No pathologic adenopathy. Reproductive: Uterus and bilateral adnexa are unremarkable. Other: Small volume ascites. No free intraperitoneal gas. There is diffuse body wall edema. No abdominal wall hernia. Musculoskeletal: No acute or destructive bony lesions. Reconstructed images demonstrate no additional findings. IMPRESSION: 1. Large right pleural effusion volume estimated 2 L. No complication after reported right thoracentesis. 2. Cirrhosis, with portal venous hypertension manifested by large gastric/esophageal varices, splenomegaly, and small volume ascites. 3. Diffuse body wall edema. Electronically Signed   By: Randa Ngo M.D.   On: 11/05/2021 18:10   US Abdomen Limited  Result Date: 11/06/2021 CLINICAL DATA:  Assess for ascites. EXAM:  LIMITED ABDOMEN ULTRASOUND FOR ASCITES TECHNIQUE: Limited ultrasound survey for ascites was performed in all four abdominal quadrants. COMPARISON:  None. FINDINGS: Four quadrant survey of the abdomen shows no significant ascites. IMPRESSION: No significant ascites. Electronically Signed   By: Kerby Moors M.D.   On: 11/06/2021 11:56   US THORACENTESIS ASP PLEURAL SPACE W/IMG GUIDE  Result Date: 11/06/2021 INDICATION: Shortness of breast, history of cirrhosis, pleural effusion seen on previous chest x-ray. Request for therapeutic and diagnostic thoracentesis. EXAM: ULTRASOUND GUIDED RIGHT THORACENTESIS MEDICATIONS: 10 mL 1% lidocaine COMPLICATIONS: None immediate. PROCEDURE: An ultrasound guided thoracentesis was thoroughly discussed with the patient and questions answered. The benefits, risks, alternatives and complications were also discussed. The patient understands and wishes to proceed with the procedure. Written consent was obtained. Ultrasound was performed to localize and mark an adequate pocket of fluid in the right chest. The area was then prepped and draped in the normal sterile fashion. 1% Lidocaine was used for local anesthesia. Under ultrasound guidance a 6 Fr Safe-T-Centesis catheter was introduced. Thoracentesis was performed. The catheter was removed and a dressing applied. FINDINGS: A total of approximately 1,650 cc of hazy, orange colored fluid was removed. Samples were sent to the laboratory as requested by the clinical team. Post procedure chest X-ray reviewed, negative for pneumothorax. IMPRESSION: Successful ultrasound guided right thoracentesis yielding 1,650 cc of pleural fluid. Read by: Durenda Guthrie, PA-C Electronically Signed   By: Jerilynn Mages.  Shick M.D.   On: 11/06/2021 12:42       Assessment / Plan:    41 year old female with a history of decompensated alcoholic cirrhosis admitted with worsening shortness of breath/abdominal pain, found to have recurrent large right-sided pleural  effusion,  hypotensive on admission.   See yesterday's note for details of her case. Her cirrhosis is quite decompensated, MELD-Na 33 on arrival - large varices / portal hypertensive gastritis, chronic anemia, jaundice, hepatic encephalopathy, history of SBP, coagulopathic / thrombocytopenia. 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   Given her MELD / jaundice / hepatic encephalopathy she is not a good candidate for TIPS to manage her effusion, at least right now. In this light, management of hepatic hydrothorax would be to maximize her diuretics, as her renal function is fortunately normal. However, she has had hypotension recently complicating her course. Propranolol has been held (resting HR also around 60 right now off propranolol). Now on midodrine, BP seems improved today, perhaps this may be her new baseline. Have held off on IVF and would give albumin infusion PRN if significant hypotension recurs. Will slowly add back diuretics as she tolerates, lasix 40mg  / day and aldactone 100mg  / day and see how she tolerates this in light of her blood pressure, and titrate up over upcoming days. She states she was compliant with higher dosing as outpatient (80mg  / 200mg ) at home as well as low Na diet. In regards her varices treatment, unfortunately holding propranolol right now. Hopefully we can resume this in upcoming days / weeks once stable on diuretic regimen and effusion better controlled. If not the other option is endoscopy with banding but with her coagulopathy / thrombocytopenia she is high risk for bleeding with elective band ligation. She is not bleeding at this time aside from scant hemorrhoidal bleeding, passing brown stool. Chronic anemia stable.  Moving forward, she has been abstinent from alcohol now almost 6 months. Recommend referral to transplant hepatologist upon discharge to get plugged in their system and be evaluated for liver transplant. She wishes to do  this and hopefully can see [facility] hepatology office in [place] in the near future, our office can help coordinate upon discharge although I think she will be in the hospital for several days, given her quick recurrence of effusion leading to readmission.   Otherwise, she is having some intolerance of the lactulose, her mental status is sharp, will reduce dose of that. Also would avoid narcotics with her history of encephalopathy, can give low dose tylenol PRN. Avoid NSAIDs.  Recommend: - lasix 40mg  today and aldactone 100mg  today if BP allows, monitor renal function / electrolytes, titrate up dosing as she tolerates - low Na diet - continue midodrine - no maintenance IVF, only give albumin PRN if significant hypotension recurs - holding propranolol for now as above - reducing dose of lactulose due to tolerance - no evidence of infection on fluid analysis, can switch her back to prior SBP prophylaxis of cipro or bactrim - continue protonix - low dose tylenol PRN pain, please avoid narcotics - transplant hepatology consultation upon discharge as outpatient or consideration for inpatient transfer if she worsens in the interim  Will follow. Dr. Hilarie Fredrickson to assume inpatient GI care tomorrow.  Jolly Mango, MD [facility]

## 2021-11-07 NOTE — Progress Notes (Signed)
Patient has had 3 bowel movements today with bright red blood that is a moderate amount in the toilet. Patient has known hemorrhoids but it appears to be more blood than what is expected for these. I notified Dr. Doristine Bosworth to make sure we didn't need to check a cbc before tomorrow am and she stated to order one now. Patient made aware.

## 2021-11-08 DIAGNOSIS — K7031 Alcoholic cirrhosis of liver with ascites: Secondary | ICD-10-CM | POA: Diagnosis not present

## 2021-11-08 DIAGNOSIS — E861 Hypovolemia: Secondary | ICD-10-CM

## 2021-11-08 DIAGNOSIS — I9589 Other hypotension: Secondary | ICD-10-CM

## 2021-11-08 DIAGNOSIS — J918 Pleural effusion in other conditions classified elsewhere: Secondary | ICD-10-CM | POA: Diagnosis not present

## 2021-11-08 DIAGNOSIS — K769 Liver disease, unspecified: Secondary | ICD-10-CM | POA: Diagnosis not present

## 2021-11-08 LAB — CYTOLOGY - NON PAP

## 2021-11-08 LAB — CBC WITH DIFFERENTIAL/PLATELET
Abs Immature Granulocytes: 0.02 10*3/uL (ref 0.00–0.07)
Abs Immature Granulocytes: 0.02 10*3/uL (ref 0.00–0.07)
Basophils Absolute: 0 10*3/uL (ref 0.0–0.1)
Basophils Absolute: 0 10*3/uL (ref 0.0–0.1)
Basophils Relative: 1 %
Basophils Relative: 1 %
Eosinophils Absolute: 0.1 10*3/uL (ref 0.0–0.5)
Eosinophils Absolute: 0.2 10*3/uL (ref 0.0–0.5)
Eosinophils Relative: 4 %
Eosinophils Relative: 3 %
HCT: 24.6 % — ABNORMAL LOW (ref 36.0–46.0)
HCT: 27.5 % — ABNORMAL LOW (ref 36.0–46.0)
Hemoglobin: 8 g/dL — ABNORMAL LOW (ref 12.0–15.0)
Hemoglobin: 8.5 g/dL — ABNORMAL LOW (ref 12.0–15.0)
Immature Granulocytes: 1 %
Immature Granulocytes: 0 %
Lymphocytes Relative: 30 %
Lymphocytes Relative: 23 %
Lymphs Abs: 1.1 10*3/uL (ref 0.7–4.0)
Lymphs Abs: 1.2 10*3/uL (ref 0.7–4.0)
MCH: 29.4 pg (ref 26.0–34.0)
MCH: 28.7 pg (ref 26.0–34.0)
MCHC: 32.5 g/dL (ref 30.0–36.0)
MCHC: 30.9 g/dL (ref 30.0–36.0)
MCV: 90.4 fL (ref 80.0–100.0)
MCV: 92.9 fL (ref 80.0–100.0)
Monocytes Absolute: 0.4 10*3/uL (ref 0.1–1.0)
Monocytes Absolute: 0.6 10*3/uL (ref 0.1–1.0)
Monocytes Relative: 9 %
Monocytes Relative: 11 %
Neutro Abs: 2.1 10*3/uL (ref 1.7–7.7)
Neutro Abs: 3.2 10*3/uL (ref 1.7–7.7)
Neutrophils Relative %: 55 %
Neutrophils Relative %: 62 %
Platelets: 36 10*3/uL — ABNORMAL LOW (ref 150–400)
Platelets: 47 10*3/uL — ABNORMAL LOW (ref 150–400)
RBC: 2.72 MIL/uL — ABNORMAL LOW (ref 3.87–5.11)
RBC: 2.96 MIL/uL — ABNORMAL LOW (ref 3.87–5.11)
RDW: 23.3 % — ABNORMAL HIGH (ref 11.5–15.5)
RDW: 23.2 % — ABNORMAL HIGH (ref 11.5–15.5)
WBC: 3.7 10*3/uL — ABNORMAL LOW (ref 4.0–10.5)
WBC: 5.2 10*3/uL (ref 4.0–10.5)
nRBC: 0 % (ref 0.0–0.2)
nRBC: 0 % (ref 0.0–0.2)

## 2021-11-08 LAB — COMPREHENSIVE METABOLIC PANEL
ALT: 21 U/L (ref 0–44)
AST: 48 U/L — ABNORMAL HIGH (ref 15–41)
Albumin: 1.9 g/dL — ABNORMAL LOW (ref 3.5–5.0)
Alkaline Phosphatase: 102 U/L (ref 38–126)
Anion gap: 5 (ref 5–15)
BUN: 9 mg/dL (ref 6–20)
CO2: 20 mmol/L — ABNORMAL LOW (ref 22–32)
Calcium: 7.7 mg/dL — ABNORMAL LOW (ref 8.9–10.3)
Chloride: 106 mmol/L (ref 98–111)
Creatinine, Ser: 0.76 mg/dL (ref 0.44–1.00)
GFR, Estimated: >60 mL/min (ref >60)
Glucose, Bld: 79 mg/dL (ref 70–99)
Potassium: 4.1 mmol/L (ref 3.5–5.1)
Sodium: 131 mmol/L — ABNORMAL LOW (ref 135–145)
Total Bilirubin: 13.4 mg/dL — ABNORMAL HIGH (ref 0.3–1.2)
Total Protein: 4.8 g/dL — ABNORMAL LOW (ref 6.5–8.1)

## 2021-11-08 MED ORDER — PANTOPRAZOLE SODIUM 40 MG PO TBEC
40.0000 mg | DELAYED_RELEASE_TABLET | Freq: Two times a day (BID) | ORAL | Status: DC
  Administered 2021-11-08 – 2021-11-11 (×6): 40 mg via ORAL
  Filled 2021-11-08 (×6): qty 1

## 2021-11-08 NOTE — Progress Notes (Signed)
Daily Rounding Note  11/08/2021, 9:39 AM  LOS: 3 days   SUBJECTIVE:   Chief complaint: Decompensated cirrhosis.  Now complaining of meal associated nausea, no emesis.  Also has continuation of chronic, generalized, nonsevere abdominal discomfort.  Reports 3 bowel movements yesterday.  OBJECTIVE:         Vital signs in last 24 hours:    Temp:  [98.4 F (36.9 C)-98.6 F (37 C)] 98.4 F (36.9 C) (01/09 0520) Pulse Rate:  [70-73] 73 (01/09 0520) Resp:  [17-20] 19 (01/09 0520) BP: (93-99)/(55-63) 93/55 (01/09 0520) SpO2:  [97 %-99 %] 99 % (01/09 0520) Last BM Date: 11/07/21 Filed Weights   11/06/21 2158  Weight: 88.8 kg   General: Jaundiced, comfortable, alert. Heart: RRR. Chest: No labored breathing or cough. Abdomen: Soft, minimally tender throughout.  No guarding or rebound.  Bowel sounds active. Extremities: No CCE. Neuro/Psych: Alert.  Appropriate.  Oriented x3.  Moves all 4 limbs.  No asterixis.  Intake/Output from previous day: No intake/output data recorded.  Intake/Output this shift: No intake/output data recorded.  Lab Results: Recent Labs    11/07/21 1629 11/07/21 1759 11/08/21 0531  WBC 3.6* 4.0 3.7*  HGB 7.8* 7.9* 8.0*  HCT 24.1* 26.0* 24.6*  PLT 34* 39* 36*   BMET Recent Labs    11/05/21 1314 11/05/21 1727 11/07/21 0541 11/08/21 0531  NA 127* 132* 131* 131*  K 4.8 4.3 4.1 4.1  CL 103 103 107 106  CO2 20*  --  20* 20*  GLUCOSE 106* 82 92 79  BUN 8 7 11 9   CREATININE 0.80 0.80 0.82 0.76  CALCIUM 7.4*  --  7.8* 7.7*   LFT Recent Labs    11/05/21 1314 11/07/21 0541 11/08/21 0531  PROT 5.4* 4.5* 4.8*  ALBUMIN 1.8* 1.9* 1.9*  AST 65* 38 48*  ALT 21 18 21   ALKPHOS 150* 122 102  BILITOT 14.4* 11.9* 13.4*   PT/INR Recent Labs    11/05/21 1314  LABPROT 31.7*  INR 3.1*   Hepatitis Panel No results for input(s): HEPBSAG, HCVAB, HEPAIGM, HEPBIGM in the last 72  hours.  Studies/Results: DG Chest 1 View  Result Date: 11/06/2021 CLINICAL DATA:  Status post right thoracentesis. EXAM: CHEST  1 VIEW COMPARISON:  11/05/2021. FINDINGS: Near complete evacuation of the right pleural effusion following thoracentesis. Minimal hazy opacity persists at the right lateral lung base. Remainder of the lungs is clear. No pneumothorax. IMPRESSION: 1. Near complete evacuation of the right pleural effusion following thoracentesis. 2. No pneumothorax. Electronically Signed   By: Lajean Manes M.D.   On: 11/06/2021 11:46   US Abdomen Limited  Result Date: 11/06/2021 CLINICAL DATA:  Assess for ascites. EXAM: LIMITED ABDOMEN ULTRASOUND FOR ASCITES TECHNIQUE: Limited ultrasound survey for ascites was performed in all four abdominal quadrants. COMPARISON:  None. FINDINGS: Four quadrant survey of the abdomen shows no significant ascites. IMPRESSION: No significant ascites. Electronically Signed   By: Kerby Moors M.D.   On: 11/06/2021 11:56   US THORACENTESIS ASP PLEURAL SPACE W/IMG GUIDE  Result Date: 11/06/2021 INDICATION: Shortness of breast, history of cirrhosis, pleural effusion seen on previous chest x-ray. Request for therapeutic and diagnostic thoracentesis. EXAM: ULTRASOUND GUIDED RIGHT THORACENTESIS MEDICATIONS: 10 mL 1% lidocaine COMPLICATIONS: None immediate. PROCEDURE: An ultrasound guided thoracentesis was thoroughly discussed with the patient and questions answered. The benefits, risks, alternatives and complications were also discussed. The patient understands and wishes to proceed with the procedure. Written  consent was obtained. Ultrasound was performed to localize and mark an adequate pocket of fluid in the right chest. The area was then prepped and draped in the normal sterile fashion. 1% Lidocaine was used for local anesthesia. Under ultrasound guidance a 6 Fr Safe-T-Centesis catheter was introduced. Thoracentesis was performed. The catheter was removed and a dressing  applied. FINDINGS: A total of approximately 1,650 cc of hazy, orange colored fluid was removed. Samples were sent to the laboratory as requested by the clinical team. Post procedure chest X-ray reviewed, negative for pneumothorax. IMPRESSION: Successful ultrasound guided right thoracentesis yielding 1,650 cc of pleural fluid. Read by: Durenda Guthrie, PA-C Electronically Signed   By: Jerilynn Mages.  Shick M.D.   On: 11/06/2021 12:42    Scheduled Meds:  ciprofloxacin  500 mg Oral Q breakfast   folic acid  1 mg Oral Daily   furosemide  40 mg Oral Daily   lactulose  20 g Oral Daily   metroNIDAZOLE  500 mg Oral Q12H   midodrine  10 mg Oral TID   pantoprazole (PROTONIX) IV  40 mg Intravenous Q12H   spironolactone  100 mg Oral Daily   Continuous Infusions: PRN Meds:.acetaminophen, ondansetron **OR** ondansetron (ZOFRAN) IV   ASSESMENT:   Alcoholic cirrhosis, decompensated with jaundice MELD-Na 33 at arrival.  Patient claims alcohol abstinence since 07/2021..  Right pleural effusion, recurrent.  Thoracentesis 1.5 L 11/02/2021 at [facility], 1.6 L 11/06/2021.  WBCs present but no bugs on gram stain, Clx negative thus far.    Reports of tarry black stools PTA.  EGD 08/29/2021: Large esophageal varices, not banded due to persistent coagulopathy.  Friable portal hypertensive gastropathy.  Erosive gastropathy without stigmata of bleeding is suspected source of chronic bleeding.  Normal examined duodenum.  Propranolol d/cd due to hypotension.  She is complaining of nausea without emesis within the last 24 hours.  Has chronic low-level abdominal discomfort.  Hepatic encephalopathy.  Able to reduce dose of lactulose yesterday.  No signs of encephalopathy today.  Had run out of Xifaxan and lactulose for a few months PTA.    Hx ascites, SBP.  Daily po cipro (as per outpt), metronidazole in place.  No significant ascites on limited ultrasounds of 11/02/2021, 11/06/2021.  Hypotension.  Prompted discontinuation of propanolol,  initiation midodrine.  Spironolactone  Rectal bleeding PTA.  Attributed to hemorrhoids.  Colonoscopy 08/29/2021 (for rectal bleeding/anemia): Fair prep, partial cecal visualization due to extensive solid stool.  Nonbleeding internal for rectal bleeding, anemia hemorrhoids.  Friability on contact bleeding throughout the colon, attributed to thrombocytopenia/coagulopathy.  Previously prescribed hydrocortisone suppositories.    Trichomonas in urine, day 3 (of 7?) po Metronidazole which can cause nausea.    Hyponatremia.  Chronic.  Supposed to be on low Na diet but orders are for reg diet.    Thrombocytopenia, chronic.  Chronic, normocytic anemia, improved since admission.  No transfusions thus far during this admission.   PLAN   Switch to Protonix 40 mg bid. ? Need for telemetry?       2 gm Na diet.      Azucena Freed  11/08/2021, 9:39 AM Phone [phone]

## 2021-11-08 NOTE — Progress Notes (Signed)
PROGRESS NOTE    Victoria Gordon  VWU:[id number] DOB: 1981-09-18 DOA: 11/05/2021 PCP: Patient, [name]   Brief Narrative:  Victoria Gordon is a 41 y.o. female with medical history significant of Alcohol abuse, Alcoholic cirrhosis of liver with ascites, Anemia, Anxiety, Hemorrhoid, Hepatitis B, Major depressive disorder, PTSD, and Substance abuse who was discharged from [facility] on 11/03/2021 after having thoracentesis for pleural effusion.  During her hospitalization she received 1 unit of packed red blood cells for anemia with hemoglobin of 6.9.  GI was not consulted.  She was discharged home since she was stable.  She returned to ED with epigastric right upper quadrant abdominal pain as well as pain in her right lateral chest wall with increased shortness of breath.  She also had nausea she reports having black tarry stools for the last few weeks along with rectal bright red bleed.  She does admit to having a occasional periods of confusion which she attributes to her liver disease. She has not had any fever or chills and has not had no known sick contacts. Has had decreased appetite the last 2 days.  She has jaundice of her skin which is unchanged for the past few weeks she states.  She describes the pain in her abdomen and side is an aching pain that does not radiate.    In the emergency room he has had soft blood pressure in the 80-100/43-56 range, pulse 50-60 respirations 15-27.  She has been afebrile.  She was started on cefepime and daptomycin for possible early sepsis with bradycardia and low blood pressures below 90 systolically..  She does have history of bacteremia.  There is risk of spontaneous bacterial peritonitis  Lactic acid level 0.9, WBC 5100 hemoglobin 9. Patient was given albumin infusion in the emergency room.  Hospital service was asked to admit for further management  Assessment & Plan:   Principal Problem:   Pleural effusion associated with  hepatic disorder Active Problems:   Thrombocytopenia (HCC)   Hyperammonemia (HCC)   Alcoholic cirrhosis of liver with ascites (HCC)   Anemia   Hypotension   Hyperbilirubinemia   Esophageal varices without bleeding ([place])  Large right-sided pleural effusion/hydrothorax: This is postthoracentesis with removal of 1600 cc fluid yesterday, patient feels better since then.  Alcoholic cirrhosis of liver with ascites/possible SBP: Ultrasound abdomen did not show ascites.  No suspicion of SBP.  Ciprofloxacin for SBP prophylaxis was resumed.  Per GI Her cirrhosis is quite decompensated, MELD-Na 33 on arrival - large varices / portal hypertensive gastritis, chronic anemia, jaundice, hepatic encephalopathy, history of SBP, coagulopathic / thrombocytopenia. 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 Given her MELD / jaundice / hepatic encephalopathy she is not a good candidate for TIPS to manage her effusion, at least right now. In this light, management of hepatic hydrothorax would be to maximize her diuretics, as her renal function is fortunately normal but her blood pressure which is borderline low is limiting [place].  We will continue Lasix 40 mg once daily and Aldactone is added by GI, will hold if SBP less than 90.  Melena/bright red blood per rectum: Acute blood loss anemia with known history of esophageal varices: Her hemoglobin was 8.4 when she left [facility] on 11/03/2021, now dropped to 7.3 and stable.  Per GI, no plans for EGD or colonoscopy since her INR is high and platelets are very low.  She is high bleeding risk.  She is likely bleeding  from hemorrhoids per GI.    Hyperammonemia: Elevated but this is her baseline.   Hypotension: Blood pressure is still low but she is asymptomatic.  Continue midodrine.  If no improvement by tomorrow, will consider increasing the dose.   Chronic pancytopenia: Likely secondary to marrow suppression due to chronic alcoholism.  Platelets  at her baseline.  Avoid heparin products.   Hyperbilirubinemia Chronic and stable.  Secondary to cirrhosis.  Trichomonas vaginalis: Her UA is positive with trichomonas.  Continue Flagyl for total of 7 days.  DVT prophylaxis: SCDs Start: 11/06/21 0602   Code Status: Full Code  Family Communication:  None present at bedside.  Plan of care discussed with patient in length and he/she verbalized understanding and agreed with it.  Status is: Inpatient  Remains inpatient appropriate because: She is very sick.  Estimated body mass index is 36.99 kg/m as calculated from the following:   Height as of this encounter: 5\' 1"  (1.549 m).   Weight as of this encounter: 88.8 kg.    Nutritional Assessment: Body mass index is 36.99 kg/m.Marland Kitchen Seen by dietician.  I agree with the assessment and plan as outlined below: Nutrition Status:    Skin Assessment: I have examined the patient's skin and I agree with the wound assessment as performed by the wound care RN as outlined below:    Consultants:  GI IR  Procedures:  None  Antimicrobials:  Anti-infectives (From admission, onward)    Start     Dose/Rate Route Frequency Ordered Stop   11/07/21 1045  ciprofloxacin (CIPRO) tablet 500 mg        500 mg Oral Daily with breakfast 11/07/21 0952     11/06/21 1230  cefTRIAXone (ROCEPHIN) 2 g in sodium chloride 0.9 % 100 mL IVPB  Status:  Discontinued        2 g 200 mL/hr over 30 Minutes Intravenous Every 24 hours 11/06/21 1023 11/06/21 1030   11/06/21 1045  cefTRIAXone (ROCEPHIN) 2 g in sodium chloride 0.9 % 100 mL IVPB  Status:  Discontinued        2 g 200 mL/hr over 30 Minutes Intravenous Every 24 hours 11/06/21 1038 11/07/21 0952   11/06/21 1000  metroNIDAZOLE (FLAGYL) tablet 500 mg        500 mg Oral Every 12 hours 11/06/21 0852 11/13/21 0959   11/05/21 2200  linezolid (ZYVOX) IVPB 600 mg  Status:  Discontinued        600 mg 300 mL/hr over 60 Minutes Intravenous Every 12 hours 11/05/21 1749  11/05/21 1800   11/05/21 1815  DAPTOmycin (CUBICIN) 700 mg in sodium chloride 0.9 % IVPB  Status:  Discontinued        8 mg/kg  88.8 kg 128 mL/hr over 30 Minutes Intravenous Daily 11/05/21 1808 11/06/21 1023   11/05/21 1745  ceFEPIme (MAXIPIME) 2 g in sodium chloride 0.9 % 100 mL IVPB  Status:  Discontinued        2 g 200 mL/hr over 30 Minutes Intravenous Every 8 hours 11/05/21 1739 11/06/21 1023         Subjective: Seen and examined.  Still complains of abdominal pain but bearable.  No nausea.  No other complaint.  Objective: Vitals:   11/07/21 1154 11/07/21 2000 11/08/21 0520 11/08/21 0900  BP: (!) 93/55 99/63 (!) 93/55   Pulse:  70 73 63  Resp: 17 20 19 17   Temp: 98.6 F (37 C) 98.5 F (36.9 C) 98.4 F (36.9 C)   TempSrc:  Oral Oral   SpO2:  97% 99% 98%  Weight:      Height:       No intake or output data in the 24 hours ending 11/08/21 1218  Filed Weights   11/06/21 2158  Weight: 88.8 kg    Examination:  General exam: Appears calm and comfortable, visibly icteric Respiratory system: Clear to auscultation. Respiratory effort normal. Cardiovascular system: S1 & S2 heard, RRR. No JVD, murmurs, rubs, gallops or clicks. No pedal edema. Gastrointestinal system: Abdomen is nondistended, soft and mild generalized tenderness, no organomegaly or masses felt. Normal bowel sounds heard. Central nervous system: Alert and oriented. No focal neurological deficits. Extremities: Symmetric 5 x 5 power. Skin: No rashes, lesions or ulcers.  Psychiatry: Judgement and insight appear normal. Mood & affect appropriate.   Data Reviewed: I have personally reviewed following labs and imaging studies  CBC: Recent Labs  Lab 11/05/21 1314 11/05/21 1727 11/06/21 1640 11/07/21 0541 11/07/21 1629 11/07/21 1759 11/08/21 0531  WBC 5.1   < > 3.0* 3.1* 3.6* 4.0 3.7*  NEUTROABS 3.4  --  1.9 2.1 2.4  --  2.1  HGB 9.0*   < > 7.2* 7.3* 7.8* 7.9* 8.0*  HCT 28.8*   < > 23.5* 23.8* 24.1*  26.0* 24.6*  MCV 92.9   < > 91.8 92.2 92.0 94.2 90.4  PLT 38*   < > 31* 31* 34* 39* 36*   < > = values in this interval not displayed.    Basic Metabolic Panel: Recent Labs  Lab 11/02/21 0523 11/03/21 0611 11/05/21 1314 11/05/21 1727 11/07/21 0541 11/08/21 0531  NA 133* 132* 127* 132* 131* 131*  K 3.9 3.5 4.8 4.3 4.1 4.1  CL 108 106 103 103 107 106  CO2 18* 20* 20*  --  20* 20*  GLUCOSE 83 99 106* 82 92 79  BUN 13 12 8 7 11 9   CREATININE 0.69 0.71 0.80 0.80 0.82 0.76  CALCIUM 7.5* 7.4* 7.4*  --  7.8* 7.7*  MG  --   --   --   --  1.9  --     GFR: Estimated Creatinine Clearance: 96.6 mL/min (by C-G formula based on SCr of 0.76 mg/dL). Liver Function Tests: Recent Labs  Lab 11/02/21 0523 11/03/21 0611 11/05/21 1314 11/07/21 0541 11/08/21 0531  AST 39 41 65* 38 48*  ALT 20 22 21 18 21   ALKPHOS 110 106 150* 122 102  BILITOT 11.2* 11.9* 14.4* 11.9* 13.4*  PROT 4.5* 4.6* 5.4* 4.5* 4.8*  ALBUMIN 1.6* 1.7* 1.8* 1.9* 1.9*    Recent Labs  Lab 11/01/21 1331 11/05/21 1500  LIPASE 66* 60*    Recent Labs  Lab 11/05/21 1314  AMMONIA 49*    Coagulation Profile: Recent Labs  Lab 11/01/21 1331 11/05/21 1314  INR 2.9* 3.1*    Cardiac Enzymes: No results for input(s): CKTOTAL, CKMB, CKMBINDEX, TROPONINI in the last 168 hours. BNP (last 3 results) No results for input(s): PROBNP in the last 8760 hours. HbA1C: No results for input(s): HGBA1C in the last 72 hours. CBG: No results for input(s): GLUCAP in the last 168 hours. Lipid Profile: No results for input(s): CHOL, HDL, LDLCALC, TRIG, CHOLHDL, LDLDIRECT in the last 72 hours. Thyroid Function Tests: No results for input(s): TSH, T4TOTAL, FREET4, T3FREE, THYROIDAB in the last 72 hours. Anemia Panel: No results for input(s): VITAMINB12, FOLATE, FERRITIN, TIBC, IRON, RETICCTPCT in the last 72 hours. Sepsis Labs: Recent Labs  Lab 11/05/21 1606 11/05/21 1806 11/06/21 4235  PROCALCITON  --   --  <0.10   LATICACIDVEN 0.9 1.2  --      Recent Results (from the past 240 hour(s))  Resp Panel by RT-PCR (Flu A&B, Covid) Nasopharyngeal Swab     Status: None   Collection Time: 11/01/21  5:30 PM   Specimen: Nasopharyngeal Swab; Nasopharyngeal(NP) swabs in vial transport medium  Result Value Ref Range Status   SARS Coronavirus 2 by RT PCR NEGATIVE NEGATIVE Final    Comment: (NOTE) SARS-CoV-2 target nucleic acids are NOT DETECTED.  The SARS-CoV-2 RNA is generally detectable in upper respiratory specimens during the acute phase of infection. The lowest concentration of SARS-CoV-2 viral copies this assay can detect is 138 copies/mL. A negative result does not preclude SARS-Cov-2 infection and should not be used as the sole basis for treatment or other patient management decisions. A negative result may occur with  improper specimen collection/handling, submission of specimen other than nasopharyngeal swab, presence of viral mutation(s) within the areas targeted by this assay, and inadequate number of viral copies(<138 copies/mL). A negative result must be combined with clinical observations, patient history, and epidemiological information. The expected result is Negative.  Fact Sheet for Patients:  EntrepreneurPulse.com.au  Fact Sheet for Healthcare Providers:  IncredibleEmployment.be  This test is no t yet approved or cleared by the [place] FDA and  has been authorized for detection and/or diagnosis of SARS-CoV-2 by FDA under an Emergency Use Authorization (EUA). This EUA will remain  in effect (meaning this test can be used) for the duration of the COVID-19 declaration under Section 564(b)(1) of the Act, 21 U.S.C.section 360bbb-3(b)(1), unless the authorization is terminated  or revoked sooner.       Influenza A by PCR NEGATIVE NEGATIVE Final   Influenza B by PCR NEGATIVE NEGATIVE Final    Comment: (NOTE) The Xpert Xpress  SARS-CoV-2/FLU/RSV plus assay is intended as an aid in the diagnosis of influenza from Nasopharyngeal swab specimens and should not be used as a sole basis for treatment. Nasal washings and aspirates are unacceptable for Xpert Xpress SARS-CoV-2/FLU/RSV testing.  Fact Sheet for Patients: EntrepreneurPulse.com.au  Fact Sheet for Healthcare Providers: IncredibleEmployment.be  This test is not yet approved or cleared by the [place] FDA and has been authorized for detection and/or diagnosis of SARS-CoV-2 by FDA under an Emergency Use Authorization (EUA). This EUA will remain in effect (meaning this test can be used) for the duration of the COVID-19 declaration under Section 564(b)(1) of the Act, 21 U.S.C. section 360bbb-3(b)(1), unless the authorization is terminated or revoked.  Performed at [facility], [address] [place], [place] [address]   Gram stain     Status: None   Collection Time: 11/02/21 10:35 AM   Specimen: Pleura  Result Value Ref Range Status   Specimen Description PLEURAL  Final   Special Requests NONE  Final   Gram Stain   Final    NO ORGANISMS SEEN WBC PRESENT,BOTH PMN AND MONONUCLEAR CYTOSPIN SMEAR Performed at [facility], [address], [place], [place] [address]    Report Status 11/02/2021 FINAL  Final  Culture, body fluid w Gram Stain-bottle     Status: None   Collection Time: 11/02/21 10:35 AM   Specimen: Pleura  Result Value Ref Range Status   Specimen Description PLEURAL  Final   Special Requests BOTTLES DRAWN AEROBIC AND ANAEROBIC 10 CC  Final   Gram Stain   Final    NO ORGANISMS SEEN WBC PRESENT,BOTH PMN AND MONONUCLEAR CYTOSPIN  SMEAR    Culture   Final    NO GROWTH < 12 HOURS Performed at [facility], [address] [place], [place] [address]    Report Status 11/02/2021 FINAL  Final  Urine Culture     Status: None   Collection Time: 11/05/21  1:50 PM   Specimen: Urine, Clean Catch  Result  Value Ref Range Status   Specimen Description URINE, CLEAN CATCH  Final   Special Requests NONE  Final   Culture   Final    NO GROWTH Performed at [facility], [place] [address] [place], [place] [address]    Report Status 11/07/2021 FINAL  Final  Blood culture (routine x 2)     Status: None (Preliminary result)   Collection Time: 11/05/21  4:09 PM   Specimen: BLOOD  Result Value Ref Range Status   Specimen Description BLOOD SITE NOT SPECIFIED  Final   Special Requests   Final    BOTTLES DRAWN AEROBIC AND ANAEROBIC Blood Culture adequate volume   Culture   Final    NO GROWTH 3 DAYS Performed at [facility], [place] [address], [place], [place] [address]    Report Status PENDING  Incomplete  Resp Panel by RT-PCR (Flu A&B, Covid) Nasopharyngeal Swab     Status: None   Collection Time: 11/05/21 11:10 PM   Specimen: Nasopharyngeal Swab; Nasopharyngeal(NP) swabs in vial transport medium  Result Value Ref Range Status   SARS Coronavirus 2 by RT PCR NEGATIVE NEGATIVE Final    Comment: (NOTE) SARS-CoV-2 target nucleic acids are NOT DETECTED.  The SARS-CoV-2 RNA is generally detectable in upper respiratory specimens during the acute phase of infection. The lowest concentration of SARS-CoV-2 viral copies this assay can detect is 138 copies/mL. A negative result does not preclude SARS-Cov-2 infection and should not be used as the sole basis for treatment or other patient management decisions. A negative result may occur with  improper specimen collection/handling, submission of specimen other than nasopharyngeal swab, presence of viral mutation(s) within the areas targeted by this assay, and inadequate number of viral copies(<138 copies/mL). A negative result must be combined with clinical observations, patient history, and epidemiological information. The expected result is Negative.  Fact Sheet for Patients:  EntrepreneurPulse.com.au  Fact Sheet for  Healthcare Providers:  IncredibleEmployment.be  This test is no t yet approved or cleared by the [place] FDA and  has been authorized for detection and/or diagnosis of SARS-CoV-2 by FDA under an Emergency Use Authorization (EUA). This EUA will remain  in effect (meaning this test can be used) for the duration of the COVID-19 declaration under Section 564(b)(1) of the Act, 21 U.S.C.section 360bbb-3(b)(1), unless the authorization is terminated  or revoked sooner.       Influenza A by PCR NEGATIVE NEGATIVE Final   Influenza B by PCR NEGATIVE NEGATIVE Final    Comment: (NOTE) The Xpert Xpress SARS-CoV-2/FLU/RSV plus assay is intended as an aid in the diagnosis of influenza from Nasopharyngeal swab specimens and should not be used as a sole basis for treatment. Nasal washings and aspirates are unacceptable for Xpert Xpress SARS-CoV-2/FLU/RSV testing.  Fact Sheet for Patients: EntrepreneurPulse.com.au  Fact Sheet for Healthcare Providers: IncredibleEmployment.be  This test is not yet approved or cleared by the [place] FDA and has been authorized for detection and/or diagnosis of SARS-CoV-2 by FDA under an Emergency Use Authorization (EUA). This EUA will remain in effect (meaning this test can be used) for the duration of  the COVID-19 declaration under Section 564(b)(1) of the Act, 21 U.S.C. section 360bbb-3(b)(1), unless the authorization is terminated or revoked.  Performed at [facility], [place] [address] [place], [place] [address]   Culture, body fluid w Gram Stain-bottle     Status: None (Preliminary result)   Collection Time: 11/06/21 12:08 PM   Specimen: Pleura  Result Value Ref Range Status   Specimen Description PLEURAL RIGHT  Final   Special Requests NONE  Final   Culture   Final    NO GROWTH 2 DAYS Performed at [facility] [address] [place], [place] [address]    Report Status  PENDING  Incomplete  Gram stain     Status: None   Collection Time: 11/06/21 12:08 PM   Specimen: Pleura  Result Value Ref Range Status   Specimen Description PLEURAL RIGHT  Final   Special Requests NONE  Final   Gram Stain   Final    WBC PRESENT,BOTH PMN AND MONONUCLEAR NO ORGANISMS SEEN CYTOSPIN SMEAR Performed at [facility], [address], [place], [place] [address]    Report Status 11/06/2021 FINAL  Final      Radiology Studies: No results found.  Scheduled Meds:  ciprofloxacin  500 mg Oral Q breakfast   folic acid  1 mg Oral Daily   furosemide  40 mg Oral Daily   lactulose  20 g Oral Daily   metroNIDAZOLE  500 mg Oral Q12H   midodrine  10 mg Oral TID   pantoprazole  40 mg Oral BID   spironolactone  100 mg Oral Daily   Continuous Infusions:     LOS: 3 days   Time spent: 28 minutes  Darliss Cheney, MD [facility]  11/08/2021, 12:18 PM  Please page via Shea [place] and do not message via secure chat for anything urgent. Secure chat can be used for anything non urgent.  How to contact the [facility] Attending or Consulting provider [place] or covering provider during after hours [place], for this patient?  Check the care team in [facility] and look for a) attending/consulting TRH provider listed and b) the [facility] team listed. Page or secure chat [phone]. Log into [email or web address] and use [place]'s universal password to access. If you do not have the password, please contact the hospital operator. Locate the [facility] provider you are looking for under [facility] and page to a number that you can be directly reached. If you still have difficulty reaching the provider, please page the [employer] (Director on Call) for the Hospitalists listed on amion for assistance.

## 2021-11-08 NOTE — Evaluation (Signed)
Physical Therapy Evaluation Patient Details Name: Victoria Gordon MRN: [id number] DOB: 03-31-1981 Today's Date: 11/08/2021  History of Present Illness  Victoria Gordon is a 41 y.o. female with medical history significant fAlcohol abuse, Alcoholic cirrhosis of liver with ascites, Anemia, Anxiety, Hemorrhoid, Hepatitis B, Major depressive disorder, PTSD, and Substance abuse who was discharged from [facility] two days ago after having thoracentesis for pleural effusion.  This morning she woke up and had epigastric right upper quadrant abdominal pain as well as pain in her right lateral chest wall with increased shortness of breath.  Large right-sided pleural effusion/hydrothorax: This is postthoracentesis with removal of 1600 cc fluid 11/06/21, patient feels better since then.   Clinical Impression  Patient received in bed, agreeable to PT assessment. Patient is independent with bed mobility, transfers and ambulated in room without assistance. Slower pace, but no lob or difficulty noted. Patient does not require PT intervention at this time. Will sign off.        Recommendations for follow up therapy are one component of a multi-disciplinary discharge planning process, led by the attending physician.  Recommendations may be updated based on patient status, additional functional criteria and insurance authorization.  Follow Up Recommendations No PT follow up    Assistance Recommended at Discharge Intermittent Supervision/Assistance  Patient can return home with the following       Equipment Recommendations None recommended by PT  Recommendations for Other Services       Functional Status Assessment Patient has had a recent decline in their functional status and demonstrates the ability to make significant improvements in function in a reasonable and predictable amount of time.     Precautions / Restrictions Precautions Precautions: None Restrictions Weight Bearing  Restrictions: No      Mobility  Bed Mobility Overal bed mobility: Independent                  Transfers Overall transfer level: Independent                      Ambulation/Gait Ambulation/Gait assistance: Independent   Assistive device: None Gait Pattern/deviations: WFL(Within Functional Limits)       General Gait Details: patient ambulated in room without AD, no assistance. No lob.  Stairs            Wheelchair Mobility    Modified Rankin (Stroke Patients Only)       Balance Overall balance assessment: Independent                                           Pertinent Vitals/Pain Pain Assessment: No/denies pain    Home Living Family/patient expects to be discharged to:: Private residence Living Arrangements: Parent;Other relatives Available Help at Discharge: Family;Available 24 hours/day Type of Home: House Home Access: Level entry       Home Layout: Laundry or work area in [place]: None      Prior Function Prior Level of Function : Independent/Modified Independent             Mobility Comments: independent ADLs Comments: independent     Hand Dominance        Extremity/Trunk Assessment   Upper Extremity Assessment Upper Extremity Assessment: Overall WFL for tasks assessed;Defer to OT evaluation    Lower Extremity Assessment Lower Extremity Assessment: Overall WFL for tasks assessed  Cervical / Trunk Assessment Cervical / Trunk Assessment: Normal  Communication   Communication: No difficulties  Cognition Arousal/Alertness: Awake/alert Behavior During Therapy: WFL for tasks assessed/performed Overall Cognitive Status: Within Functional Limits for tasks assessed                                          General Comments      Exercises     Assessment/Plan    PT Assessment Patient does not need any further PT services  PT Problem List         PT  Treatment Interventions      PT Goals (Current goals can be found in the Care Plan section)  Acute Rehab PT Goals Patient Stated Goal: to return home PT Goal Formulation: With patient Time For Goal Achievement: 11/15/21 Potential to Achieve Goals: Good    Frequency       Co-evaluation               AM-PAC PT "6 Clicks" Mobility  Outcome Measure Help needed turning from your back to your side while in a flat bed without using bedrails?: None Help needed moving from lying on your back to sitting on the side of a flat bed without using bedrails?: None Help needed moving to and from a bed to a chair (including a wheelchair)?: None Help needed standing up from a chair using your arms (e.g., wheelchair or bedside chair)?: None Help needed to walk in hospital room?: None Help needed climbing 3-5 steps with a railing? : None 6 Click Score: 24    End of Session   Activity Tolerance: Patient tolerated treatment well Patient left: in bed;with call bell/phone within reach Nurse Communication: Mobility status      Time: 2956-2130 PT Time Calculation (min) (ACUTE ONLY): 10 min   Charges:   PT Evaluation $PT Eval Low Complexity: 1 Low          Indie Boehne, PT, GCS 11/08/21,2:27 PM

## 2021-11-09 DIAGNOSIS — K7031 Alcoholic cirrhosis of liver with ascites: Secondary | ICD-10-CM | POA: Diagnosis not present

## 2021-11-09 LAB — TYPE AND SCREEN
ABO/RH(D): O POS
Antibody Screen: POSITIVE
Donor AG Type: NEGATIVE
Donor AG Type: NEGATIVE
Unit division: 0
Unit division: 0

## 2021-11-09 LAB — CBC WITH DIFFERENTIAL/PLATELET
Abs Immature Granulocytes: 0.02 10*3/uL (ref 0.00–0.07)
Basophils Absolute: 0 10*3/uL (ref 0.0–0.1)
Basophils Relative: 0 %
Eosinophils Absolute: 0.1 10*3/uL (ref 0.0–0.5)
Eosinophils Relative: 3 %
HCT: 23.2 % — ABNORMAL LOW (ref 36.0–46.0)
Hemoglobin: 7.4 g/dL — ABNORMAL LOW (ref 12.0–15.0)
Immature Granulocytes: 1 %
Lymphocytes Relative: 24 %
Lymphs Abs: 0.9 10*3/uL (ref 0.7–4.0)
MCH: 28.9 pg (ref 26.0–34.0)
MCHC: 31.9 g/dL (ref 30.0–36.0)
MCV: 90.6 fL (ref 80.0–100.0)
Monocytes Absolute: 0.4 10*3/uL (ref 0.1–1.0)
Monocytes Relative: 11 %
Neutro Abs: 2.2 10*3/uL (ref 1.7–7.7)
Neutrophils Relative %: 61 %
Platelets: 39 10*3/uL — ABNORMAL LOW (ref 150–400)
RBC: 2.56 MIL/uL — ABNORMAL LOW (ref 3.87–5.11)
RDW: 22.9 % — ABNORMAL HIGH (ref 11.5–15.5)
WBC: 3.7 10*3/uL — ABNORMAL LOW (ref 4.0–10.5)
nRBC: 0 % (ref 0.0–0.2)

## 2021-11-09 LAB — COMPREHENSIVE METABOLIC PANEL
ALT: 19 U/L (ref 0–44)
AST: 40 U/L (ref 15–41)
Albumin: 1.8 g/dL — ABNORMAL LOW (ref 3.5–5.0)
Alkaline Phosphatase: 134 U/L — ABNORMAL HIGH (ref 38–126)
Anion gap: 4 — ABNORMAL LOW (ref 5–15)
BUN: 10 mg/dL (ref 6–20)
CO2: 20 mmol/L — ABNORMAL LOW (ref 22–32)
Calcium: 7.3 mg/dL — ABNORMAL LOW (ref 8.9–10.3)
Chloride: 105 mmol/L (ref 98–111)
Creatinine, Ser: 0.85 mg/dL (ref 0.44–1.00)
GFR, Estimated: >60 mL/min (ref >60)
Glucose, Bld: 90 mg/dL (ref 70–99)
Potassium: 3.9 mmol/L (ref 3.5–5.1)
Sodium: 129 mmol/L — ABNORMAL LOW (ref 135–145)
Total Bilirubin: 12.5 mg/dL — ABNORMAL HIGH (ref 0.3–1.2)
Total Protein: 4.7 g/dL — ABNORMAL LOW (ref 6.5–8.1)

## 2021-11-09 LAB — BPAM RBC
Blood Product Expiration Date: 202302012359
Blood Product Expiration Date: 202302022359
Unit Type and Rh: 5100
Unit Type and Rh: 5100

## 2021-11-09 NOTE — Progress Notes (Addendum)
Daily Rounding Note  11/09/2021, 8:29 AM  LOS: 4 days   SUBJECTIVE:   Chief complaint:  decompensated cirrhosis.     Still w moderate pressure like epigastric discomfort.  No nausea.  4 BM yesterday.  Tolerating solids. Says she gets some SOB when walks in hallway.     Hypotension persists but stable in 90/40s to 1teens/60s. HR normal.    OBJECTIVE:         Vital signs in last 24 hours:    Temp:  [98 F (36.7 C)-99 F (37.2 C)] 99 F (37.2 C) (01/10 0458) Pulse Rate:  [63-74] 66 (01/10 0458) Resp:  [17-20] 20 (01/10 0458) BP: (97-105)/(49-58) 105/56 (01/10 0458) SpO2:  [95 %-99 %] 95 % (01/10 0458) Last BM Date: 11/07/21 Filed Weights   11/06/21 2158  Weight: 88.8 kg   General: seems depressed.  Jaundiced w scleral icterus.    Heart: RRR Chest: diminished BS in R base.  No cough or dyspnea.  No adventitious sounds Abdomen: soft, ND.  Hyperactive BS.  Diffuse minor/mild abd tenderness wo G or R.    Extremities: no CCE Neuro/Psych:  flat affect.  Pleasant, cooperative.  Oriented x 3.  No tremor or asterixis.    Intake/Output from previous day: 01/09 0701 - 01/10 0700 In: 480 [P.O.:480] Out: -   Intake/Output this shift: No intake/output data recorded.  Lab Results: Recent Labs    11/08/21 0531 11/08/21 1651 11/09/21 0452  WBC 3.7* 5.2 3.7*  HGB 8.0* 8.5* 7.4*  HCT 24.6* 27.5* 23.2*  PLT 36* 47* 39*   BMET Recent Labs    11/07/21 0541 11/08/21 0531 11/09/21 0452  NA 131* 131* 129*  K 4.1 4.1 3.9  CL 107 106 105  CO2 20* 20* 20*  GLUCOSE 92 79 90  BUN 11 9 10   CREATININE 0.82 0.76 0.85  CALCIUM 7.8* 7.7* 7.3*   LFT Recent Labs    11/07/21 0541 11/08/21 0531 11/09/21 0452  PROT 4.5* 4.8* 4.7*  ALBUMIN 1.9* 1.9* 1.8*  AST 38 48* 40  ALT 18 21 19   ALKPHOS 122 102 134*  BILITOT 11.9* 13.4* 12.5*   PT/INR No results for input(s): LABPROT, INR in the last 72 hours. Hepatitis Panel No  results for input(s): HEPBSAG, HCVAB, HEPAIGM, HEPBIGM in the last 72 hours.  Studies/Results: No results found.  Scheduled Meds:  ciprofloxacin  500 mg Oral Q breakfast   folic acid  1 mg Oral Daily   furosemide  40 mg Oral Daily   lactulose  20 g Oral Daily   metroNIDAZOLE  500 mg Oral Q12H   midodrine  10 mg Oral TID   pantoprazole  40 mg Oral BID   spironolactone  100 mg Oral Daily   Continuous Infusions: PRN Meds:.acetaminophen, ondansetron **OR** ondansetron (ZOFRAN) IV   ASSESMENT:   Alcoholic cirrhosis, decompensated with jaundice. MELD-Na 33 at arrival.  Patient claims alcohol abstinence since 07/2021..   Right pleural effusion/hepatic hydrothorax, recurrent.  Thoracentesis 1.5 L 11/02/2021, 1.6 L 11/06/2021.  WBCs present but no bugs on gram stain, Clx negative.  on Aldactone 100 mg, Lasix 40 mg daily.  RA O2 sats mid to upper 90s%    Reports of tarry black stools PTA.  EGD 08/29/2021: Large esophageal varices, not banded due to persistent coagulopathy.  Friable portal hypertensive gastropathy, erosive gastropathy wo stigmata of bleeding (suspected source of chronic bleeding).  Normal examined duodenum.  Propranolol d/cd due to hypotension  and hx SBP.  Chronic low-level abdominal discomfort.  Protonix 40 bid in place.     Hepatic encephalopathy.  No signs of encephalopathy today.  Ran out of Xifaxan, lactulose for a few months PTA.     Hx ascites, SBP.  Daily po cipro (as per outpt) in place.  No significant ascites on [place]  11/02/2021, 11/06/2021.   Hypotension.  Prompted discontinuation of propanolol, initiation midodrine.     Rectal bleeding PTA.  Attributed to hemorrhoids.  Colonoscopy 08/29/2021 (for rectal bleeding/anemia): Fair prep, partial cecal visualization due to extensive solid stool. Nonbleeding internal hemorrhoids.  Friability, contact bleeding throughout colon, in setting  thrombocytopenia/coagulopathy.  Previously prescribed hydrocortisone suppositories.      Trichomonas in urine, day 4/7 po Metronidazole which can cause nausea.     Hyponatremia.  Chronic.  Low Na diet re-initiated 1/9.     Thrombocytopenia, chronic.  39 K today.     Chronic, normocytic anemia, fluctuating 7.2 ... 8.5 since admit in keeping w baseline (1.7 on 10.22!!!).  7.4 today.  No transfusions thus far.    PLAN    Plan to arrange fup w [facility] hepatology Roosevelt Locks NP at office in [place] in next several weeks.  Has 2/3 GI OV w Dr Candis Schatz.       CBC in AM.     Leave current lasix, aldactone, lactulose, Protonix in place.      Maintain ETOH abstinence.      Azucena Freed  11/09/2021, 8:29 AM Phone [phone]   Addendum: Reviewed and agree with assessment and management plan. Candice Tobey, Lajuan Lines, MD

## 2021-11-09 NOTE — Care Management (Signed)
°  Transition of Care [facility]) Screening Note   Patient Details  Name: Victoria Gordon Date of Birth: 1981/08/17   Transition of Care [facility]) CM/SW Contact:    Bethena Roys, RN Phone Number: 11/09/2021, 4:02 PM    Transition of Care Department [facility]) has reviewed patient and no TOC needs have been identified at this time. We will continue to monitor patient advancement through interdisciplinary progression rounds. If new patient transition needs arise, please place a TOC consult.

## 2021-11-09 NOTE — Progress Notes (Signed)
PROGRESS NOTE    Victoria Gordon  JJK:[id number] DOB: 02-22-1981 DOA: 11/05/2021 PCP: Patient, [name]   Brief Narrative:  Victoria Gordon is a 41 y.o. female with medical history significant of Alcohol abuse, Alcoholic cirrhosis of liver with ascites, Anemia, Anxiety, Hemorrhoid, Hepatitis B, Major depressive disorder, PTSD, and Substance abuse who was discharged from [facility] on 11/03/2021 after having thoracentesis for pleural effusion.  During her hospitalization she received 1 unit of packed red blood cells for anemia with hemoglobin of 6.9.  GI was not consulted.  She was discharged home since she was stable.  She returned to ED with epigastric right upper quadrant abdominal pain as well as pain in her right lateral chest wall with increased shortness of breath.  She also had nausea she reports having black tarry stools for the last few weeks along with rectal bright red bleed.  She does admit to having a occasional periods of confusion which she attributes to her liver disease. She has not had any fever or chills and has not had no known sick contacts. Has had decreased appetite the last 2 days.  She has jaundice of her skin which is unchanged for the past few weeks she states.  She describes the pain in her abdomen and side is an aching pain that does not radiate.    In the emergency room he has had soft blood pressure in the 80-100/43-56 range, pulse 50-60 respirations 15-27.  She has been afebrile.  She was started on cefepime and daptomycin for possible early sepsis with bradycardia and low blood pressures below 90 systolically..  She does have history of bacteremia.  There is risk of spontaneous bacterial peritonitis  Lactic acid level 0.9, WBC 5100 hemoglobin 9. Patient was given albumin infusion in the emergency room.  Hospital service was asked to admit for further management  Assessment & Plan:   Principal Problem:   Pleural effusion associated with  hepatic disorder Active Problems:   Thrombocytopenia (HCC)   Hyperammonemia (HCC)   Alcoholic cirrhosis of liver with ascites (HCC)   Anemia   Hypotension   Hyperbilirubinemia   Esophageal varices without bleeding ([place])  Large right-sided pleural effusion/hydrothorax: This is postthoracentesis with removal of 1600 cc fluid yesterday, patient feels better since then.  Alcoholic cirrhosis of liver with ascites/possible SBP: Ultrasound abdomen did not show ascites.  No suspicion of SBP.  Ciprofloxacin for SBP prophylaxis was resumed.  Per GI Her cirrhosis is quite decompensated, MELD-Na 33 on arrival - large varices / portal hypertensive gastritis, chronic anemia, jaundice, hepatic encephalopathy, history of SBP, coagulopathic / thrombocytopenia. 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 Given her MELD / jaundice / hepatic encephalopathy she is not a good candidate for TIPS to manage her effusion, at least right now. In this light, management of hepatic hydrothorax would be to maximize her diuretics, as her renal function is fortunately normal but her blood pressure which is borderline low is limiting [place].  We will continue Lasix 40 mg once daily and Aldactone is added by GI, will hold if SBP less than 90.  Melena/bright red blood per rectum: Acute blood loss anemia with known history of esophageal varices: Her hemoglobin was 8.4 when she left [facility] on 11/03/2021, it dropped to 7.3 but then improved without transfusion and has remained stable.  Per GI, no plans for EGD or colonoscopy since her INR is high and platelets are very low.  She is high  bleeding risk.  She is likely bleeding from hemorrhoids per GI.  Patient continues to complain of nausea and abdominal pain but tolerating diet.  No clear plans from GI as inpatient, per their recent note, they are arranging outpatient follow-up with [facility] in next several weeks.  Has appointment with Dr. Candis Schatz of  GI on 12/03/2021.  Hyperammonemia: Elevated but this is her baseline.   Hypotension: Blood pressure now improving.  Continue current dose of midodrine.   Chronic pancytopenia: Likely secondary to marrow suppression due to chronic alcoholism.  Platelets at her baseline.  Avoid heparin products.   Hyperbilirubinemia Chronic and stable.  Secondary to cirrhosis.  Trichomonas vaginalis: Her UA is positive with trichomonas.  Continue Flagyl for total of 7 days.  DVT prophylaxis: SCDs Start: 11/06/21 0602   Code Status: Full Code  Family Communication:  None present at bedside.  Plan of care discussed with patient in length and he/she verbalized understanding and agreed with it.  Status is: Inpatient  Remains inpatient appropriate because: She is very sick.  Estimated body mass index is 36.99 kg/m as calculated from the following:   Height as of this encounter: 5\' 1"  (1.549 m).   Weight as of this encounter: 88.8 kg.    Nutritional Assessment: Body mass index is 36.99 kg/m.Marland Kitchen Seen by dietician.  I agree with the assessment and plan as outlined below: Nutrition Status:    Skin Assessment: I have examined the patient's skin and I agree with the wound assessment as performed by the wound care RN as outlined below:    Consultants:  GI IR  Procedures:  None  Antimicrobials:  Anti-infectives (From admission, onward)    Start     Dose/Rate Route Frequency Ordered Stop   11/07/21 1045  ciprofloxacin (CIPRO) tablet 500 mg        500 mg Oral Daily with breakfast 11/07/21 0952     11/06/21 1230  cefTRIAXone (ROCEPHIN) 2 g in sodium chloride 0.9 % 100 mL IVPB  Status:  Discontinued        2 g 200 mL/hr over 30 Minutes Intravenous Every 24 hours 11/06/21 1023 11/06/21 1030   11/06/21 1045  cefTRIAXone (ROCEPHIN) 2 g in sodium chloride 0.9 % 100 mL IVPB  Status:  Discontinued        2 g 200 mL/hr over 30 Minutes Intravenous Every 24 hours 11/06/21 1038 11/07/21 0952   11/06/21 1000   metroNIDAZOLE (FLAGYL) tablet 500 mg        500 mg Oral Every 12 hours 11/06/21 0852 11/13/21 0959   11/05/21 2200  linezolid (ZYVOX) IVPB 600 mg  Status:  Discontinued        600 mg 300 mL/hr over 60 Minutes Intravenous Every 12 hours 11/05/21 1749 11/05/21 1800   11/05/21 1815  DAPTOmycin (CUBICIN) 700 mg in sodium chloride 0.9 % IVPB  Status:  Discontinued        8 mg/kg  88.8 kg 128 mL/hr over 30 Minutes Intravenous Daily 11/05/21 1808 11/06/21 1023   11/05/21 1745  ceFEPIme (MAXIPIME) 2 g in sodium chloride 0.9 % 100 mL IVPB  Status:  Discontinued        2 g 200 mL/hr over 30 Minutes Intravenous Every 8 hours 11/05/21 1739 11/06/21 1023         Subjective:  Seen and examined.  Complains of slightly worsened abdominal pain and some nausea but tolerating diet.  No other complaint.  Objective: Vitals:   11/08/21 1845 11/08/21 2016  11/09/21 0458 11/09/21 0843  BP: (!) 97/49 (!) 99/49 (!) 105/56 111/69  Pulse:  74 66 71  Resp: 19 18 20 18   Temp:  98.9 F (37.2 C) 99 F (37.2 C) 98.7 F (37.1 C)  TempSrc:  Oral Oral Oral  SpO2:  98% 95% 98%  Weight:      Height:        Intake/Output Summary (Last 24 hours) at 11/09/2021 1149 Last data filed at 11/09/2021 0843 Gross per 24 hour  Intake 870 ml  Output --  Net 870 ml    Filed Weights   11/06/21 2158  Weight: 88.8 kg    Examination:  General exam: Appears calm and comfortable, visibly icteric Respiratory system: Clear to auscultation. Respiratory effort normal. Cardiovascular system: S1 & S2 heard, RRR. No JVD, murmurs, rubs, gallops or clicks. No pedal edema. Gastrointestinal system: Abdomen is nondistended, soft and mild to moderate generalized abdominal tenderness, especially more on epigastrium.. No organomegaly or masses felt. Normal bowel sounds heard. Central nervous system: Alert and oriented. No focal neurological deficits. Extremities: Symmetric 5 x 5 power. Skin: No rashes, lesions or ulcers.   Psychiatry: Judgement and insight appear normal. Mood & affect appropriate.   Data Reviewed: I have personally reviewed following labs and imaging studies  CBC: Recent Labs  Lab 11/07/21 0541 11/07/21 1629 11/07/21 1759 11/08/21 0531 11/08/21 1651 11/09/21 0452  WBC 3.1* 3.6* 4.0 3.7* 5.2 3.7*  NEUTROABS 2.1 2.4  --  2.1 3.2 2.2  HGB 7.3* 7.8* 7.9* 8.0* 8.5* 7.4*  HCT 23.8* 24.1* 26.0* 24.6* 27.5* 23.2*  MCV 92.2 92.0 94.2 90.4 92.9 90.6  PLT 31* 34* 39* 36* 47* 39*    Basic Metabolic Panel: Recent Labs  Lab 11/03/21 0611 11/05/21 1314 11/05/21 1727 11/07/21 0541 11/08/21 0531 11/09/21 0452  NA 132* 127* 132* 131* 131* 129*  K 3.5 4.8 4.3 4.1 4.1 3.9  CL 106 103 103 107 106 105  CO2 20* 20*  --  20* 20* 20*  GLUCOSE 99 106* 82 92 79 90  BUN 12 8 7 11 9 10   CREATININE 0.71 0.80 0.80 0.82 0.76 0.85  CALCIUM 7.4* 7.4*  --  7.8* 7.7* 7.3*  MG  --   --   --  1.9  --   --     GFR: Estimated Creatinine Clearance: 91 mL/min (by C-G formula based on SCr of 0.85 mg/dL). Liver Function Tests: Recent Labs  Lab 11/03/21 0611 11/05/21 1314 11/07/21 0541 11/08/21 0531 11/09/21 0452  AST 41 65* 38 48* 40  ALT 22 21 18 21 19   ALKPHOS 106 150* 122 102 134*  BILITOT 11.9* 14.4* 11.9* 13.4* 12.5*  PROT 4.6* 5.4* 4.5* 4.8* 4.7*  ALBUMIN 1.7* 1.8* 1.9* 1.9* 1.8*    Recent Labs  Lab 11/05/21 1500  LIPASE 60*    Recent Labs  Lab 11/05/21 1314  AMMONIA 49*    Coagulation Profile: Recent Labs  Lab 11/05/21 1314  INR 3.1*    Cardiac Enzymes: No results for input(s): CKTOTAL, CKMB, CKMBINDEX, TROPONINI in the last 168 hours. BNP (last 3 results) No results for input(s): PROBNP in the last 8760 hours. HbA1C: No results for input(s): HGBA1C in the last 72 hours. CBG: No results for input(s): GLUCAP in the last 168 hours. Lipid Profile: No results for input(s): CHOL, HDL, LDLCALC, TRIG, CHOLHDL, LDLDIRECT in the last 72 hours. Thyroid Function Tests: No  results for input(s): TSH, T4TOTAL, FREET4, T3FREE, THYROIDAB in the last 72 hours.  Anemia Panel: No results for input(s): VITAMINB12, FOLATE, FERRITIN, TIBC, IRON, RETICCTPCT in the last 72 hours. Sepsis Labs: Recent Labs  Lab 11/05/21 1606 11/05/21 1806 11/06/21 0847  PROCALCITON  --   --  <0.10  LATICACIDVEN 0.9 1.2  --      Recent Results (from the past 240 hour(s))  Resp Panel by RT-PCR (Flu A&B, Covid) Nasopharyngeal Swab     Status: None   Collection Time: 11/01/21  5:30 PM   Specimen: Nasopharyngeal Swab; Nasopharyngeal(NP) swabs in vial transport medium  Result Value Ref Range Status   SARS Coronavirus 2 by RT PCR NEGATIVE NEGATIVE Final    Comment: (NOTE) SARS-CoV-2 target nucleic acids are NOT DETECTED.  The SARS-CoV-2 RNA is generally detectable in upper respiratory specimens during the acute phase of infection. The lowest concentration of SARS-CoV-2 viral copies this assay can detect is 138 copies/mL. A negative result does not preclude SARS-Cov-2 infection and should not be used as the sole basis for treatment or other patient management decisions. A negative result may occur with  improper specimen collection/handling, submission of specimen other than nasopharyngeal swab, presence of viral mutation(s) within the areas targeted by this assay, and inadequate number of viral copies(<138 copies/mL). A negative result must be combined with clinical observations, patient history, and epidemiological information. The expected result is Negative.  Fact Sheet for Patients:  EntrepreneurPulse.com.au  Fact Sheet for Healthcare Providers:  IncredibleEmployment.be  This test is no t yet approved or cleared by the [place] FDA and  has been authorized for detection and/or diagnosis of SARS-CoV-2 by FDA under an Emergency Use Authorization (EUA). This EUA will remain  in effect (meaning this test can be used) for the duration of  the COVID-19 declaration under Section 564(b)(1) of the Act, 21 U.S.C.section 360bbb-3(b)(1), unless the authorization is terminated  or revoked sooner.       Influenza A by PCR NEGATIVE NEGATIVE Final   Influenza B by PCR NEGATIVE NEGATIVE Final    Comment: (NOTE) The Xpert Xpress SARS-CoV-2/FLU/RSV plus assay is intended as an aid in the diagnosis of influenza from Nasopharyngeal swab specimens and should not be used as a sole basis for treatment. Nasal washings and aspirates are unacceptable for Xpert Xpress SARS-CoV-2/FLU/RSV testing.  Fact Sheet for Patients: EntrepreneurPulse.com.au  Fact Sheet for Healthcare Providers: IncredibleEmployment.be  This test is not yet approved or cleared by the [place] FDA and has been authorized for detection and/or diagnosis of SARS-CoV-2 by FDA under an Emergency Use Authorization (EUA). This EUA will remain in effect (meaning this test can be used) for the duration of the COVID-19 declaration under Section 564(b)(1) of the Act, 21 U.S.C. section 360bbb-3(b)(1), unless the authorization is terminated or revoked.  Performed at [facility], [address] [place], [place] [address]   Gram stain     Status: None   Collection Time: 11/02/21 10:35 AM   Specimen: Pleura  Result Value Ref Range Status   Specimen Description PLEURAL  Final   Special Requests NONE  Final   Gram Stain   Final    NO ORGANISMS SEEN WBC PRESENT,BOTH PMN AND MONONUCLEAR CYTOSPIN SMEAR Performed at [facility], [address], [place], [place] [address]    Report Status 11/02/2021 FINAL  Final  Culture, body fluid w Gram Stain-bottle     Status: None   Collection Time: 11/02/21 10:35 AM   Specimen: Pleura  Result Value Ref Range Status   Specimen Description PLEURAL  Final   Special  Requests BOTTLES DRAWN AEROBIC AND ANAEROBIC 10 CC  Final   Gram Stain   Final    NO ORGANISMS SEEN WBC PRESENT,BOTH PMN AND  MONONUCLEAR CYTOSPIN SMEAR    Culture   Final    NO GROWTH < 12 HOURS Performed at [facility], [address], [place], [place] [address]    Report Status 11/02/2021 FINAL  Final  Urine Culture     Status: None   Collection Time: 11/05/21  1:50 PM   Specimen: Urine, Clean Catch  Result Value Ref Range Status   Specimen Description URINE, CLEAN CATCH  Final   Special Requests NONE  Final   Culture   Final    NO GROWTH Performed at [facility], [place] [address], [place], [place] [address]    Report Status 11/07/2021 FINAL  Final  Blood culture (routine x 2)     Status: None (Preliminary result)   Collection Time: 11/05/21  4:09 PM   Specimen: BLOOD  Result Value Ref Range Status   Specimen Description BLOOD SITE NOT SPECIFIED  Final   Special Requests   Final    BOTTLES DRAWN AEROBIC AND ANAEROBIC Blood Culture adequate volume   Culture   Final    NO GROWTH 4 DAYS Performed at [facility], [place] [address], [place],  [address]    Report Status PENDING  Incomplete  Resp Panel by RT-PCR (Flu A&B, Covid) Nasopharyngeal Swab     Status: None   Collection Time: 11/05/21 11:10 PM   Specimen: Nasopharyngeal Swab; Nasopharyngeal(NP) swabs in vial transport medium  Result Value Ref Range Status   SARS Coronavirus 2 by RT PCR NEGATIVE NEGATIVE Final    Comment: (NOTE) SARS-CoV-2 target nucleic acids are NOT DETECTED.  The SARS-CoV-2 RNA is generally detectable in upper respiratory specimens during the acute phase of infection. The lowest concentration of SARS-CoV-2 viral copies this assay can detect is 138 copies/mL. A negative result does not preclude SARS-Cov-2 infection and should not be used as the sole basis for treatment or other patient management decisions. A negative result may occur with  improper specimen collection/handling, submission of specimen other than nasopharyngeal swab, presence of viral mutation(s) within the areas targeted by this assay, and  inadequate number of viral copies(<138 copies/mL). A negative result must be combined with clinical observations, patient history, and epidemiological information. The expected result is Negative.  Fact Sheet for Patients:  EntrepreneurPulse.com.au  Fact Sheet for Healthcare Providers:  IncredibleEmployment.be  This test is no t yet approved or cleared by the [place] FDA and  has been authorized for detection and/or diagnosis of SARS-CoV-2 by FDA under an Emergency Use Authorization (EUA). This EUA will remain  in effect (meaning this test can be used) for the duration of the COVID-19 declaration under Section 564(b)(1) of the Act, 21 U.S.C.section 360bbb-3(b)(1), unless the authorization is terminated  or revoked sooner.       Influenza A by PCR NEGATIVE NEGATIVE Final   Influenza B by PCR NEGATIVE NEGATIVE Final    Comment: (NOTE) The Xpert Xpress SARS-CoV-2/FLU/RSV plus assay is intended as an aid in the diagnosis of influenza from Nasopharyngeal swab specimens and should not be used as a sole basis for treatment. Nasal washings and aspirates are unacceptable for Xpert Xpress SARS-CoV-2/FLU/RSV testing.  Fact Sheet for Patients: EntrepreneurPulse.com.au  Fact Sheet for Healthcare Providers: IncredibleEmployment.be  This test is not yet approved or cleared by the [place] and has been authorized for  detection and/or diagnosis of SARS-CoV-2 by FDA under an Emergency Use Authorization (EUA). This EUA will remain in effect (meaning this test can be used) for the duration of the COVID-19 declaration under Section 564(b)(1) of the Act, 21 U.S.C. section 360bbb-3(b)(1), unless the authorization is terminated or revoked.  Performed at [facility], [place] [address], [place], [place] [address]   Culture, body fluid w Gram Stain-bottle     Status: None (Preliminary result)   Collection  Time: 11/06/21 12:08 PM   Specimen: Pleura  Result Value Ref Range Status   Specimen Description PLEURAL RIGHT  Final   Special Requests NONE  Final   Culture   Final    NO GROWTH 3 DAYS Performed at [place] [address], [place], [place] [address]    Report Status PENDING  Incomplete  Gram stain     Status: None   Collection Time: 11/06/21 12:08 PM   Specimen: Pleura  Result Value Ref Range Status   Specimen Description PLEURAL RIGHT  Final   Special Requests NONE  Final   Gram Stain   Final    WBC PRESENT,BOTH PMN AND MONONUCLEAR NO ORGANISMS SEEN CYTOSPIN SMEAR Performed at [facility], [address], [place], [place] [address]    Report Status 11/06/2021 FINAL  Final      Radiology Studies: No results found.  Scheduled Meds:  ciprofloxacin  500 mg Oral Q breakfast   folic acid  1 mg Oral Daily   furosemide  40 mg Oral Daily   lactulose  20 g Oral Daily   metroNIDAZOLE  500 mg Oral Q12H   midodrine  10 mg Oral TID   pantoprazole  40 mg Oral BID   spironolactone  100 mg Oral Daily   Continuous Infusions:     LOS: 4 days   Time spent: 26 minutes  Darliss Cheney, MD [facility]  11/09/2021, 11:49 AM  Please page via [facility] and do not message via secure chat for anything urgent. Secure chat can be used for anything non urgent.  How to contact the [facility] Attending or Consulting provider [employer] or covering provider during after hours [place], for this patient?  Check the care team in [facility] and look for a) attending/consulting TR[facility] provider listed and b) the [facility] team listed. Page or secure chat [phone]. Log into [email or web address] and use [place]'s universal password to access. If you do not have the password, please contact the hospital operator. Locate the [facility] provider you are looking for under [facility] and page to a number that you can be directly reached. If you still have difficulty reaching the provider, please page the [facility]  (Director on Call) for the Hospitalists listed on amion for assistance.

## 2021-11-09 NOTE — Evaluation (Signed)
Occupational Therapy Evaluation Patient Details Name: Victoria Gordon MRN: [id number] DOB: Feb 19, 1981 Today's Date: 11/09/2021   History of Present Illness 41 y.o. female with who was discharged from [facility] 11/03/21 ago after having thoracentesis for pleural effusion. Pt with chest pain and found to have Large right-sided pleural effusion/hydrothorax: This is postthoracentesis with removal of 1600 cc fluid 11/06/21,PMH ETOH abuse, Alcoholic cirrhosis of liver with ascites, Anemia, Anxiety, Hemorrhoid, Hepatitis B, Major depressive disorder, PTSD, and Substance abuse   Clinical Impression   Patient evaluated by Occupational Therapy with no further acute OT needs identified. All education has been completed and the patient has no further questions. See below for any follow-up Occupational Therapy or equipment needs. OT to sign off. Thank you for referral.        Recommendations for follow up therapy are one component of a multi-disciplinary discharge planning process, led by the attending physician.  Recommendations may be updated based on patient status, additional functional criteria and insurance authorization.   Follow Up Recommendations  No OT follow up    Assistance Recommended at Discharge    Patient can return home with the following      Functional Status Assessment     Equipment Recommendations  None recommended by OT    Recommendations for Other Services       Precautions / Restrictions Precautions Precautions: None Restrictions Weight Bearing Restrictions: No      Mobility Bed Mobility Overal bed mobility: Independent                  Transfers Overall transfer level: Independent                        Balance Overall balance assessment: Independent                                         ADL either performed or assessed with clinical judgement   ADL                                                Vision Baseline Vision/History: 0 No visual deficits       Perception     Praxis      Pertinent Vitals/Pain Pain Assessment: No/denies pain     Hand Dominance Right   Extremity/Trunk Assessment Upper Extremity Assessment Upper Extremity Assessment: Overall WFL for tasks assessed   Lower Extremity Assessment Lower Extremity Assessment: Overall WFL for tasks assessed   Cervical / Trunk Assessment Cervical / Trunk Assessment: Normal   Communication Communication Communication: No difficulties   Cognition Arousal/Alertness: Awake/alert Behavior During Therapy: WFL for tasks assessed/performed Overall Cognitive Status: Within Functional Limits for tasks assessed                                       General Comments  VSS    Exercises Exercises: Other exercises Other Exercises Other Exercises: education on energy conservation and provided handout. 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     Home Equipment: None          Prior Functioning/Environment Prior Level of Function : Independent/Modified Independent             Mobility Comments: independent ADLs Comments: independent        OT Problem List:        OT Treatment/Interventions:      OT Goals(Current goals can be found in the care plan section)    OT Frequency:      Co-evaluation              AM-PAC OT "6 Clicks" Daily Activity     Outcome Measure Help from another person eating meals?: None Help from another person taking care of personal  grooming?: None Help from another person toileting, which includes using toliet, bedpan, or urinal?: None Help from another person bathing (including washing, rinsing, drying)?: None Help from another person to put on and taking off regular upper body clothing?: None Help from another person to put on and taking off regular lower body clothing?: None 6 Click Score: 24   End of Session Nurse Communication: Mobility status;Precautions  Activity Tolerance: Patient tolerated treatment well Patient left: in bed;with call bell/phone within reach                   Time: 6073-7106 OT Time Calculation (min): 14 min Charges:  OT General Charges $OT Visit: 1 Visit OT Evaluation $OT Eval Low Complexity: 1 Low   Brynn, OTR/L  Acute Rehabilitation Services Pager: [phone] Office: [phone] .   Jeri Modena 11/09/2021, 11:31 AM

## 2021-11-09 NOTE — Plan of Care (Signed)
°  Problem: Education: Goal: Knowledge of General Education information will improve Description: Including pain rating scale, medication(s)/side effects and non-pharmacologic comfort measures Outcome: Progressing   Problem: Clinical Measurements: Goal: Ability to maintain clinical measurements within normal limits will improve Outcome: Progressing   Problem: Clinical Measurements: Goal: Diagnostic test results will improve Outcome: Progressing   Problem: Coping: Goal: Level of anxiety will decrease Outcome: Progressing   Problem: Pain Managment: Goal: General experience of comfort will improve Outcome: Progressing   Problem: Skin Integrity: Goal: Risk for impaired skin integrity will decrease Outcome: Progressing

## 2021-11-10 DIAGNOSIS — J918 Pleural effusion in other conditions classified elsewhere: Secondary | ICD-10-CM | POA: Diagnosis not present

## 2021-11-10 DIAGNOSIS — K7469 Other cirrhosis of liver: Secondary | ICD-10-CM

## 2021-11-10 DIAGNOSIS — R110 Nausea: Secondary | ICD-10-CM

## 2021-11-10 DIAGNOSIS — K7031 Alcoholic cirrhosis of liver with ascites: Secondary | ICD-10-CM | POA: Diagnosis not present

## 2021-11-10 LAB — CBC
HCT: 23.3 % — ABNORMAL LOW (ref 36.0–46.0)
Hemoglobin: 7.7 g/dL — ABNORMAL LOW (ref 12.0–15.0)
MCH: 29.8 pg (ref 26.0–34.0)
MCHC: 33 g/dL (ref 30.0–36.0)
MCV: 90.3 fL (ref 80.0–100.0)
Platelets: 37 10*3/uL — ABNORMAL LOW (ref 150–400)
RBC: 2.58 MIL/uL — ABNORMAL LOW (ref 3.87–5.11)
RDW: 22.7 % — ABNORMAL HIGH (ref 11.5–15.5)
WBC: 3.1 10*3/uL — ABNORMAL LOW (ref 4.0–10.5)
nRBC: 0 % (ref 0.0–0.2)

## 2021-11-10 LAB — COMPREHENSIVE METABOLIC PANEL
ALT: 18 U/L (ref 0–44)
AST: 44 U/L — ABNORMAL HIGH (ref 15–41)
Albumin: 1.9 g/dL — ABNORMAL LOW (ref 3.5–5.0)
Alkaline Phosphatase: 133 U/L — ABNORMAL HIGH (ref 38–126)
Anion gap: 6 (ref 5–15)
BUN: 7 mg/dL (ref 6–20)
CO2: 18 mmol/L — ABNORMAL LOW (ref 22–32)
Calcium: 7.6 mg/dL — ABNORMAL LOW (ref 8.9–10.3)
Chloride: 103 mmol/L (ref 98–111)
Creatinine, Ser: 0.7 mg/dL (ref 0.44–1.00)
GFR, Estimated: >60 mL/min (ref >60)
Glucose, Bld: 82 mg/dL (ref 70–99)
Potassium: 4 mmol/L (ref 3.5–5.1)
Sodium: 127 mmol/L — ABNORMAL LOW (ref 135–145)
Total Bilirubin: 12.6 mg/dL — ABNORMAL HIGH (ref 0.3–1.2)
Total Protein: 4.8 g/dL — ABNORMAL LOW (ref 6.5–8.1)

## 2021-11-10 LAB — CULTURE, BLOOD (ROUTINE X 2)
Culture: NO GROWTH
Special Requests: ADEQUATE

## 2021-11-10 MED ORDER — SPIRONOLACTONE 25 MG PO TABS
50.0000 mg | ORAL_TABLET | Freq: Every day | ORAL | Status: DC
  Administered 2021-11-10 – 2021-11-11 (×2): 50 mg via ORAL
  Filled 2021-11-10 (×2): qty 2

## 2021-11-10 MED ORDER — FUROSEMIDE 20 MG PO TABS
20.0000 mg | ORAL_TABLET | Freq: Every day | ORAL | Status: DC
  Administered 2021-11-10 – 2021-11-11 (×2): 20 mg via ORAL
  Filled 2021-11-10 (×2): qty 1

## 2021-11-10 MED ORDER — ONDANSETRON 4 MG PO TBDP
4.0000 mg | ORAL_TABLET | Freq: Three times a day (TID) | ORAL | Status: DC
  Administered 2021-11-10 – 2021-11-11 (×4): 4 mg via ORAL
  Filled 2021-11-10 (×7): qty 1

## 2021-11-10 NOTE — Progress Notes (Signed)
° ° °  Progress Note   Subjective  Patient with mild nausea, chronic abdominal pain Asking if she will go home today    Objective  Vital signs in last 24 hours: Temp:  [98.1 F (36.7 C)-99.6 F (37.6 C)] 98.1 F (36.7 C) (01/11 0725) Pulse Rate:  [64-71] 71 (01/11 0725) Resp:  [15-23] 23 (01/11 0725) BP: (92-109)/(50-62) 101/50 (01/11 0725) SpO2:  [94 %-100 %] 97 % (01/11 0725) Last BM Date: 11/09/21  Gen: awake, alert, NAD HEENT: icteric, op clear CV: RRR, no mrg Pulm: Decreased breath sounds right greater than left lower lung field Abd: soft, obese, NT/ND, +BS throughout Ext: no c/c/e Neuro: nonfocal, no asterixis   Intake/Output from previous day: 01/10 0701 - 01/11 0700 In: 630 [P.O.:630] Out: 200 [Urine:200] Intake/Output this shift: Total I/O In: 180 [P.O.:180] Out: -   Lab Results: Recent Labs    11/08/21 1651 11/09/21 0452 11/10/21 0619  WBC 5.2 3.7* 3.1*  HGB 8.5* 7.4* 7.7*  HCT 27.5* 23.2* 23.3*  PLT 47* 39* 37*   BMET Recent Labs    11/08/21 0531 11/09/21 0452 11/10/21 0619  NA 131* 129* 127*  K 4.1 3.9 4.0  CL 106 105 103  CO2 20* 20* 18*  GLUCOSE 79 90 82  BUN 9 10 7   CREATININE 0.76 0.85 0.70  CALCIUM 7.7* 7.3* 7.6*   LFT Recent Labs    11/10/21 0619  PROT 4.8*  ALBUMIN 1.9*  AST 44*  ALT 18  ALKPHOS 133*  BILITOT 12.6*      Assessment & Recommendations  41 year old female with decompensated alcohol-related cirrhosis with portal hypertension admitted with further decompensation complicated by hepatic hydrothorax  1.  Decompensated cirrhosis --patient with severely decompensated liver disease from alcohol induced cirrhosis.  Unfortunately I do not expect her to significantly recover in any quick manner.  Working to get her home since.  She will need consult with [facility] for transplant evaluation.  She has not drank since early September 2022 and thus would be appropriate for consideration of liver transplantation  after March 2022.  Work-up should start before that --Ascites/hepatic hydrothorax/hyponatremia --dyspnea has improved since thoracentesis.  She does not have significant abdominal ascites.  We have been diuresing with the furosemide 40 and spironolactone 100 mg, though her sodium has drifted downward.  This will limit diuretics at current dose therefore today reduce furosemide to 20 mg and spironolactone 50 mg daily; monitor sodium --Hepatic encephalopathy --under control, continue lactulose History of SBP--continue Cipro daily for prophylaxis --Anemia --stable without bleeding --Low-sodium diet --Continue twice daily PPI --Repeat INR tomorrow will allow for new MELD calc  2.  Nausea/upper abdominal pain --both are chronic though nausea worse of late.  I feel this is related to metronidazole.  She is almost done with this therapy for trichomonas. --Schedule Zofran 4 mg ODT 3 times daily; changed to as needed as able --Stop metronidazole after treatment completes and I expect nausea to improve  Hopefully home tomorrow with outpatient [facility] and new outpatient hepatology consultation      LOS: 5 days   Jerene Bears  11/10/2021, 10:26 AM See Shea Evans, [facility], to contact our on call provider

## 2021-11-10 NOTE — Plan of Care (Signed)
  Problem: Education: Goal: Knowledge of General Education information will improve Description: Including pain rating scale, medication(s)/side effects and non-pharmacologic comfort measures Outcome: Progressing   Problem: Clinical Measurements: Goal: Ability to maintain clinical measurements within normal limits will improve Outcome: Progressing   Problem: Clinical Measurements: Goal: Diagnostic test results will improve Outcome: Progressing   Problem: Nutrition: Goal: Adequate nutrition will be maintained Outcome: Progressing   Problem: Pain Managment: Goal: General experience of comfort will improve Outcome: Progressing   Problem: Skin Integrity: Goal: Risk for impaired skin integrity will decrease Outcome: Progressing   

## 2021-11-10 NOTE — Progress Notes (Signed)
PROGRESS NOTE    Victoria Gordon  WCH:[id number] DOB: 02-10-1981 DOA: 11/05/2021 PCP: Patient, [name]   Brief Narrative:  Victoria Gordon is a 41 y.o. female with medical history significant of Alcohol abuse, Alcoholic cirrhosis of liver with ascites, Anemia, Anxiety, Hemorrhoid, Hepatitis B, Major depressive disorder, PTSD, and Substance abuse who was discharged from [facility] on 11/03/2021 after having thoracentesis for pleural effusion.  During her hospitalization she received 1 unit of packed red blood cells for anemia with hemoglobin of 6.9.  GI was not consulted.  She was discharged home since she was stable.  She returned to ED with epigastric right upper quadrant abdominal pain as well as pain in her right lateral chest wall with increased shortness of breath.  She also had nausea she reports having black tarry stools for the last few weeks along with rectal bright red bleed.  She does admit to having a occasional periods of confusion which she attributes to her liver disease. She has not had any fever or chills and has not had no known sick contacts. Has had decreased appetite the last 2 days.  She has jaundice of her skin which is unchanged for the past few weeks she states.  She describes the pain in her abdomen and side is an aching pain that does not radiate.    In the emergency room he has had soft blood pressure in the 80-100/43-56 range, pulse 50-60 respirations 15-27.  She has been afebrile.  She was started on cefepime and daptomycin for possible early sepsis with bradycardia and low blood pressures below 90 systolically..  She does have history of bacteremia.  There is risk of spontaneous bacterial peritonitis  Lactic acid level 0.9, WBC 5100 hemoglobin 9. Patient was given albumin infusion in the emergency room.  Hospital service was asked to admit for further management  Assessment & Plan:   Principal Problem:   Pleural effusion associated with  hepatic disorder Active Problems:   Thrombocytopenia (HCC)   Hyperammonemia (HCC)   Alcoholic cirrhosis of liver with ascites (HCC)   Anemia   Hypotension   Hyperbilirubinemia   Esophageal varices without bleeding (HCC)   Nausea without vomiting  Large right-sided pleural effusion/hydrothorax: This is postthoracentesis with removal of 1600 cc fluid yesterday, patient feels better since then.  Alcoholic cirrhosis of liver with ascites/possible SBP: Ultrasound abdomen did not show ascites.  No suspicion of SBP.  Ciprofloxacin for SBP prophylaxis was resumed.  Per GI Her cirrhosis is quite decompensated, MELD-Na 33 on arrival - large varices / portal hypertensive gastritis, chronic anemia, jaundice, hepatic encephalopathy, history of SBP, coagulopathic / thrombocytopenia. 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 Given her MELD / jaundice / hepatic encephalopathy she is not a good candidate for TIPS to manage her effusion, at least right now. In this light, management of hepatic hydrothorax would be to maximize her diuretics, as her renal function is fortunately normal.  Her blood pressure is improving however now due to hyponatremia, GI has recommended reducing her Lasix and Aldactone to half, which is done.  Melena/bright red blood per rectum: Acute blood loss anemia with known history of esophageal varices: Her hemoglobin was 8.4 when she left [facility] on 11/03/2021, it dropped to 7.3 but then improved without transfusion and has remained stable.  Per GI, no plans for EGD or colonoscopy since her INR is high and platelets are very low.  She is high bleeding risk.  She  is likely bleeding from hemorrhoids per GI.  Patient continues to complain of nausea and abdominal pain but tolerating diet.  No clear plans from GI as inpatient, per their recent note, they are arranging outpatient follow-up with [facility] in next several weeks.  Has appointment with Dr.  Candis Schatz of GI on 12/03/2021.  Nausea: Scheduled Zofran 3 times daily ordered per GI recommendations.  Acute on chronic hyponatremia: Trending down, 127 today.  Reduced Lasix and Aldactone to half per GI recommendations.  Follow tomorrow.  Hyperammonemia: Elevated but this is her baseline.   Hypotension: Blood pressure now improving.  Continue current dose of midodrine.   Chronic pancytopenia: Likely secondary to marrow suppression due to chronic alcoholism.  Platelets at her baseline.  Avoid heparin products.   Hyperbilirubinemia Chronic and stable.  Secondary to cirrhosis.  Trichomonas vaginalis: Her UA is positive with trichomonas.  Continue Flagyl for total of 7 days.  DVT prophylaxis: SCDs Start: 11/06/21 0602   Code Status: Full Code  Family Communication:  None present at bedside.  Plan of care discussed with patient in length and he/she verbalized understanding and agreed with it.  Status is: Inpatient  Remains inpatient appropriate because: She is very sick.  Estimated body mass index is 36.99 kg/m as calculated from the following:   Height as of this encounter: 5\' 1"  (1.549 m).   Weight as of this encounter: 88.8 kg.    Nutritional Assessment: Body mass index is 36.99 kg/m.Marland Kitchen Seen by dietician.  I agree with the assessment and plan as outlined below: Nutrition Status:    Skin Assessment: I have examined the patient's skin and I agree with the wound assessment as performed by the wound care RN as outlined below:    Consultants:  GI IR  Procedures:  None  Antimicrobials:  Anti-infectives (From admission, onward)    Start     Dose/Rate Route Frequency Ordered Stop   11/07/21 1045  ciprofloxacin (CIPRO) tablet 500 mg        500 mg Oral Daily with breakfast 11/07/21 0952     11/06/21 1230  cefTRIAXone (ROCEPHIN) 2 g in sodium chloride 0.9 % 100 mL IVPB  Status:  Discontinued        2 g 200 mL/hr over 30 Minutes Intravenous Every 24 hours 11/06/21 1023  11/06/21 1030   11/06/21 1045  cefTRIAXone (ROCEPHIN) 2 g in sodium chloride 0.9 % 100 mL IVPB  Status:  Discontinued        2 g 200 mL/hr over 30 Minutes Intravenous Every 24 hours 11/06/21 1038 11/07/21 0952   11/06/21 1000  metroNIDAZOLE (FLAGYL) tablet 500 mg        500 mg Oral Every 12 hours 11/06/21 0852 11/13/21 0959   11/05/21 2200  linezolid (ZYVOX) IVPB 600 mg  Status:  Discontinued        600 mg 300 mL/hr over 60 Minutes Intravenous Every 12 hours 11/05/21 1749 11/05/21 1800   11/05/21 1815  DAPTOmycin (CUBICIN) 700 mg in sodium chloride 0.9 % IVPB  Status:  Discontinued        8 mg/kg  88.8 kg 128 mL/hr over 30 Minutes Intravenous Daily 11/05/21 1808 11/06/21 1023   11/05/21 1745  ceFEPIme (MAXIPIME) 2 g in sodium chloride 0.9 % 100 mL IVPB  Status:  Discontinued        2 g 200 mL/hr over 30 Minutes Intravenous Every 8 hours 11/05/21 1739 11/06/21 1023         Subjective:  Seen and examined.  Continues to complain of abdominal pain and nausea but tolerating diet and sometimes eating 100% of the meal but sometimes not.  Discussed with GI, they believe her nausea is likely secondary to Flagyl which she needs to complete for 7 days for trichomonas.  Objective: Vitals:   11/09/21 2331 11/10/21 0402 11/10/21 0725 11/10/21 1139  BP: (!) 109/59 (!) 101/50 (!) 101/50 (!) 105/56  Pulse: 71 67 71 68  Resp: (!) 21 15 (!) 23 (!) 24  Temp: 98.9 F (37.2 C) 98.6 F (37 C) 98.1 F (36.7 C) 98.1 F (36.7 C)  TempSrc: Oral Oral Oral Oral  SpO2: 98% 94% 97% 97%  Weight:      Height:        Intake/Output Summary (Last 24 hours) at 11/10/2021 1457 Last data filed at 11/10/2021 0807 Gross per 24 hour  Intake 420 ml  Output 200 ml  Net 220 ml    Filed Weights   11/06/21 2158  Weight: 88.8 kg    Examination:  General exam: Appears calm and comfortable  Respiratory system: Clear to auscultation. Respiratory effort normal. Cardiovascular system: S1 & S2 heard, RRR. No JVD,  murmurs, rubs, gallops or clicks. No pedal edema. Gastrointestinal system: Abdomen is nondistended, soft and mild generalized tenderness, no organomegaly or masses felt. Normal bowel sounds heard. Central nervous system: Alert and oriented. No focal neurological deficits. Extremities: Symmetric 5 x 5 power. Skin: No rashes, lesions or ulcers.  Psychiatry: Judgement and insight appear normal. Mood & affect appropriate.   Data Reviewed: I have personally reviewed following labs and imaging studies  CBC: Recent Labs  Lab 11/07/21 0541 11/07/21 1629 11/07/21 1759 11/08/21 0531 11/08/21 1651 11/09/21 0452 11/10/21 0619  WBC 3.1* 3.6* 4.0 3.7* 5.2 3.7* 3.1*  NEUTROABS 2.1 2.4  --  2.1 3.2 2.2  --   HGB 7.3* 7.8* 7.9* 8.0* 8.5* 7.4* 7.7*  HCT 23.8* 24.1* 26.0* 24.6* 27.5* 23.2* 23.3*  MCV 92.2 92.0 94.2 90.4 92.9 90.6 90.3  PLT 31* 34* 39* 36* 47* 39* 37*    Basic Metabolic Panel: Recent Labs  Lab 11/05/21 1314 11/05/21 1727 11/07/21 0541 11/08/21 0531 11/09/21 0452 11/10/21 0619  NA 127* 132* 131* 131* 129* 127*  K 4.8 4.3 4.1 4.1 3.9 4.0  CL 103 103 107 106 105 103  CO2 20*  --  20* 20* 20* 18*  GLUCOSE 106* 82 92 79 90 82  BUN 8 7 11 9 10 7   CREATININE 0.80 0.80 0.82 0.76 0.85 0.70  CALCIUM 7.4*  --  7.8* 7.7* 7.3* 7.6*  MG  --   --  1.9  --   --   --     GFR: Estimated Creatinine Clearance: 96.6 mL/min (by C-G formula based on SCr of 0.7 mg/dL). Liver Function Tests: Recent Labs  Lab 11/05/21 1314 11/07/21 0541 11/08/21 0531 11/09/21 0452 11/10/21 0619  AST 65* 38 48* 40 44*  ALT 21 18 21 19 18   ALKPHOS 150* 122 102 134* 133*  BILITOT 14.4* 11.9* 13.4* 12.5* 12.6*  PROT 5.4* 4.5* 4.8* 4.7* 4.8*  ALBUMIN 1.8* 1.9* 1.9* 1.8* 1.9*    Recent Labs  Lab 11/05/21 1500  LIPASE 60*    Recent Labs  Lab 11/05/21 1314  AMMONIA 49*    Coagulation Profile: Recent Labs  Lab 11/05/21 1314  INR 3.1*    Cardiac Enzymes: No results for input(s): CKTOTAL,  CKMB, CKMBINDEX, TROPONINI in the last 168 hours. BNP (last 3  results) No results for input(s): PROBNP in the last 8760 hours. HbA1C: No results for input(s): HGBA1C in the last 72 hours. CBG: No results for input(s): GLUCAP in the last 168 hours. Lipid Profile: No results for input(s): CHOL, HDL, LDLCALC, TRIG, CHOLHDL, LDLDIRECT in the last 72 hours. Thyroid Function Tests: No results for input(s): TSH, T4TOTAL, FREET4, T3FREE, THYROIDAB in the last 72 hours. Anemia Panel: No results for input(s): VITAMINB12, FOLATE, FERRITIN, TIBC, IRON, RETICCTPCT in the last 72 hours. Sepsis Labs: Recent Labs  Lab 11/05/21 1606 11/05/21 1806 11/06/21 0847  PROCALCITON  --   --  <0.10  LATICACIDVEN 0.9 1.2  --      Recent Results (from the past 240 hour(s))  Resp Panel by RT-PCR (Flu A&B, Covid) Nasopharyngeal Swab     Status: None   Collection Time: 11/01/21  5:30 PM   Specimen: Nasopharyngeal Swab; Nasopharyngeal(NP) swabs in vial transport medium  Result Value Ref Range Status   SARS Coronavirus 2 by RT PCR NEGATIVE NEGATIVE Final    Comment: (NOTE) SARS-CoV-2 target nucleic acids are NOT DETECTED.  The SARS-CoV-2 RNA is generally detectable in upper respiratory specimens during the acute phase of infection. The lowest concentration of SARS-CoV-2 viral copies this assay can detect is 138 copies/mL. A negative result does not preclude SARS-Cov-2 infection and should not be used as the sole basis for treatment or other patient management decisions. A negative result may occur with  improper specimen collection/handling, submission of specimen other than nasopharyngeal swab, presence of viral mutation(s) within the areas targeted by this assay, and inadequate number of viral copies(<138 copies/mL). A negative result must be combined with clinical observations, patient history, and epidemiological information. The expected result is Negative.  Fact Sheet for Patients:   EntrepreneurPulse.com.au  Fact Sheet for Healthcare Providers:  IncredibleEmployment.be  This test is no t yet approved or cleared by the [place] FDA and  has been authorized for detection and/or diagnosis of SARS-CoV-2 by FDA under an Emergency Use Authorization (EUA). This EUA will remain  in effect (meaning this test can be used) for the duration of the COVID-19 declaration under Section 564(b)(1) of the Act, 21 U.S.C.section 360bbb-3(b)(1), unless the authorization is terminated  or revoked sooner.       Influenza A by PCR NEGATIVE NEGATIVE Final   Influenza B by PCR NEGATIVE NEGATIVE Final    Comment: (NOTE) The Xpert Xpress SARS-CoV-2/FLU/RSV plus assay is intended as an aid in the diagnosis of influenza from Nasopharyngeal swab specimens and should not be used as a sole basis for treatment. Nasal washings and aspirates are unacceptable for Xpert Xpress SARS-CoV-2/FLU/RSV testing.  Fact Sheet for Patients: EntrepreneurPulse.com.au  Fact Sheet for Healthcare Providers: IncredibleEmployment.be  This test is not yet approved or cleared by the [place] FDA and has been authorized for detection and/or diagnosis of SARS-CoV-2 by FDA under an Emergency Use Authorization (EUA). This EUA will remain in effect (meaning this test can be used) for the duration of the COVID-19 declaration under Section 564(b)(1) of the Act, 21 U.S.C. section 360bbb-3(b)(1), unless the authorization is terminated or revoked.  Performed at [facility], [address], [place], [place] [address]   Gram stain     Status: None   Collection Time: 11/02/21 10:35 AM   Specimen: Pleura  Result Value Ref Range Status   Specimen Description PLEURAL  Final   Special Requests NONE  Final   Gram Stain   Final    NO ORGANISMS SEEN  WBC PRESENT,BOTH PMN AND MONONUCLEAR CYTOSPIN SMEAR Performed at [facility], [address], [place], [place] [address]    Report Status 11/02/2021 FINAL  Final  Culture, body fluid w Gram Stain-bottle     Status: None   Collection Time: 11/02/21 10:35 AM   Specimen: Pleura  Result Value Ref Range Status   Specimen Description PLEURAL  Final   Special Requests BOTTLES DRAWN AEROBIC AND ANAEROBIC 10 CC  Final   Gram Stain   Final    NO ORGANISMS SEEN WBC PRESENT,BOTH PMN AND MONONUCLEAR CYTOSPIN SMEAR    Culture   Final    NO GROWTH < 12 HOURS Performed at [facility], [address] [place], [place] [address]    Report Status 11/02/2021 FINAL  Final  Urine Culture     Status: None   Collection Time: 11/05/21  1:50 PM   Specimen: Urine, Clean Catch  Result Value Ref Range Status   Specimen Description URINE, CLEAN CATCH  Final   Special Requests NONE  Final   Culture   Final    NO GROWTH Performed at [facility], [place] [address], [place], [place] [address]    Report Status 11/07/2021 FINAL  Final  Blood culture (routine x 2)     Status: None   Collection Time: 11/05/21  4:09 PM   Specimen: BLOOD  Result Value Ref Range Status   Specimen Description BLOOD SITE NOT SPECIFIED  Final   Special Requests   Final    BOTTLES DRAWN AEROBIC AND ANAEROBIC Blood Culture adequate volume   Culture   Final    NO GROWTH 5 DAYS Performed at [facility], [place] [address] [place], [place] [address]    Report Status 11/10/2021 FINAL  Final  Resp Panel by RT-PCR (Flu A&B, Covid) Nasopharyngeal Swab     Status: None   Collection Time: 11/05/21 11:10 PM   Specimen: Nasopharyngeal Swab; Nasopharyngeal(NP) swabs in vial transport medium  Result Value Ref Range Status   SARS Coronavirus 2 by RT PCR NEGATIVE NEGATIVE Final    Comment: (NOTE) SARS-CoV-2 target nucleic acids are NOT DETECTED.  The SARS-CoV-2 RNA is generally detectable in upper respiratory specimens during the acute phase of infection. The lowest concentration of SARS-CoV-2 viral copies this assay can  detect is 138 copies/mL. A negative result does not preclude SARS-Cov-2 infection and should not be used as the sole basis for treatment or other patient management decisions. A negative result may occur with  improper specimen collection/handling, submission of specimen other than nasopharyngeal swab, presence of viral mutation(s) within the areas targeted by this assay, and inadequate number of viral copies(<138 copies/mL). A negative result must be combined with clinical observations, patient history, and epidemiological information. The expected result is Negative.  Fact Sheet for Patients:  EntrepreneurPulse.com.au  Fact Sheet for Healthcare Providers:  IncredibleEmployment.be  This test is no t yet approved or cleared by the [place] FDA and  has been authorized for detection and/or diagnosis of SARS-CoV-2 by FDA under an Emergency Use Authorization (EUA). This EUA will remain  in effect (meaning this test can be used) for the duration of the COVID-19 declaration under Section 564(b)(1) of the Act, 21 U.S.C.section 360bbb-3(b)(1), unless the authorization is terminated  or revoked sooner.       Influenza A by PCR NEGATIVE NEGATIVE Final   Influenza B by PCR NEGATIVE NEGATIVE Final    Comment: (NOTE) The Xpert Xpress SARS-CoV-2/FLU/RSV plus assay is intended  as an aid in the diagnosis of influenza from Nasopharyngeal swab specimens and should not be used as a sole basis for treatment. Nasal washings and aspirates are unacceptable for Xpert Xpress SARS-CoV-2/FLU/RSV testing.  Fact Sheet for Patients: EntrepreneurPulse.com.au  Fact Sheet for Healthcare Providers: IncredibleEmployment.be  This test is not yet approved or cleared by the [place] FDA and has been authorized for detection and/or diagnosis of SARS-CoV-2 by FDA under an Emergency Use Authorization (EUA). This EUA will remain in  effect (meaning this test can be used) for the duration of the COVID-19 declaration under Section 564(b)(1) of the Act, 21 U.S.C. section 360bbb-3(b)(1), unless the authorization is terminated or revoked.  Performed at [facility], [place] [address] [place], [place] [address]   Culture, body fluid w Gram Stain-bottle     Status: None (Preliminary result)   Collection Time: 11/06/21 12:08 PM   Specimen: Pleura  Result Value Ref Range Status   Specimen Description PLEURAL RIGHT  Final   Special Requests NONE  Final   Culture   Final    NO GROWTH 4 DAYS Performed at [place] [address], [place], [place] [address]    Report Status PENDING  Incomplete  Gram stain     Status: None   Collection Time: 11/06/21 12:08 PM   Specimen: Pleura  Result Value Ref Range Status   Specimen Description PLEURAL RIGHT  Final   Special Requests NONE  Final   Gram Stain   Final    WBC PRESENT,BOTH PMN AND MONONUCLEAR NO ORGANISMS SEEN CYTOSPIN SMEAR Performed at [facility], [address], [place], [place] [address]    Report Status 11/06/2021 FINAL  Final      Radiology Studies: No results found.  Scheduled Meds:  ciprofloxacin  500 mg Oral Q breakfast   folic acid  1 mg Oral Daily   furosemide  20 mg Oral Daily   lactulose  20 g Oral Daily   metroNIDAZOLE  500 mg Oral Q12H   midodrine  10 mg Oral TID   ondansetron  4 mg Oral TID   pantoprazole  40 mg Oral BID   spironolactone  50 mg Oral Daily   Continuous Infusions:     LOS: 5 days   Time spent: 25 minutes  Darliss Cheney, MD [facility]  11/10/2021, 2:57 PM  Please page via [place] and do not message via secure chat for anything urgent. Secure chat can be used for anything non urgent.  How to contact the [facility] Attending or Consulting provider [place] or covering provider during after hours [place], for this patient?  Check the care team in [facility] and look for a) attending/consulting [facility] provider  listed and b) the [facility] team listed. Page or secure chat [phone]. Log into [email or web address] and use [place]'s universal password to access. If you do not have the password, please contact the hospital operator. Locate the [facility] provider you are looking for under [facility] and page to a number that you can be directly reached. If you still have difficulty reaching the provider, please page the [facility] (Director on Call) for the Hospitalists listed on amion for assistance.

## 2021-11-11 ENCOUNTER — Telehealth: Payer: Self-pay

## 2021-11-11 ENCOUNTER — Other Ambulatory Visit (HOSPITAL_COMMUNITY): Payer: Self-pay

## 2021-11-11 LAB — COMPREHENSIVE METABOLIC PANEL
ALT: 20 U/L (ref 0–44)
AST: 44 U/L — ABNORMAL HIGH (ref 15–41)
Albumin: 1.8 g/dL — ABNORMAL LOW (ref 3.5–5.0)
Alkaline Phosphatase: 103 U/L (ref 38–126)
Anion gap: 7 (ref 5–15)
BUN: 7 mg/dL (ref 6–20)
CO2: 18 mmol/L — ABNORMAL LOW (ref 22–32)
Calcium: 7.5 mg/dL — ABNORMAL LOW (ref 8.9–10.3)
Chloride: 104 mmol/L (ref 98–111)
Creatinine, Ser: 0.8 mg/dL (ref 0.44–1.00)
GFR, Estimated: >60 mL/min (ref >60)
Glucose, Bld: 95 mg/dL (ref 70–99)
Potassium: 4 mmol/L (ref 3.5–5.1)
Sodium: 129 mmol/L — ABNORMAL LOW (ref 135–145)
Total Bilirubin: 13 mg/dL — ABNORMAL HIGH (ref 0.3–1.2)
Total Protein: 4.5 g/dL — ABNORMAL LOW (ref 6.5–8.1)

## 2021-11-11 LAB — CULTURE, BODY FLUID W GRAM STAIN -BOTTLE: Culture: NO GROWTH

## 2021-11-11 LAB — PROTIME-INR
INR: 3.1 — ABNORMAL HIGH (ref 0.8–1.2)
Prothrombin Time: 32.1 seconds — ABNORMAL HIGH (ref 11.4–15.2)

## 2021-11-11 MED ORDER — METRONIDAZOLE 500 MG PO TABS
500.0000 mg | ORAL_TABLET | Freq: Three times a day (TID) | ORAL | 0 refills | Status: AC
  Filled 2021-11-11: qty 6, 2d supply, fill #0

## 2021-11-11 MED ORDER — FUROSEMIDE 20 MG PO TABS
20.0000 mg | ORAL_TABLET | Freq: Every day | ORAL | 0 refills | Status: DC
  Filled 2021-11-11: qty 30, 30d supply, fill #0

## 2021-11-11 MED ORDER — MIDODRINE HCL 10 MG PO TABS
10.0000 mg | ORAL_TABLET | Freq: Three times a day (TID) | ORAL | 0 refills | Status: AC
  Filled 2021-11-11: qty 90, 30d supply, fill #0

## 2021-11-11 MED ORDER — ONDANSETRON 4 MG PO TBDP
4.0000 mg | ORAL_TABLET | Freq: Three times a day (TID) | ORAL | 0 refills | Status: DC | PRN
  Filled 2021-11-11: qty 20, 7d supply, fill #0

## 2021-11-11 MED ORDER — SPIRONOLACTONE 50 MG PO TABS
50.0000 mg | ORAL_TABLET | Freq: Every day | ORAL | 0 refills | Status: DC
  Filled 2021-11-11: qty 30, 30d supply, fill #0

## 2021-11-11 NOTE — Telephone Encounter (Signed)
-----   Message from Jerene Bears, MD sent at 11/11/2021 10:01 AM EST ----- Patient discharging from the hospital with severely decompensated alcoholic cirrhosis Follow-up with Candis Schatz as scheduled  She needs urgent referral to see Roosevelt Locks, NP at [facility] for decompensated cirrhosis and consideration of transplantation  Thanks JMP

## 2021-11-11 NOTE — Progress Notes (Signed)
Patient taking flagyl twice daily in hospital, however, on her AVS it states to start three times daily, I spoke to Dr Doristine Bosworth to confirm.  He stated to take three times daily at home.

## 2021-11-11 NOTE — Telephone Encounter (Signed)
Urgent referral request faxed along with records to [facility]/Dawn Drazek NP.

## 2021-11-11 NOTE — Discharge Summary (Signed)
PatientPhysician Discharge Summary  Victoria Gordon XFG:[id number] DOB: 17-Jul-1981 DOA: 11/05/2021  PCP: Patient, [name]  Admit date: 11/05/2021 Discharge date: 11/11/2021 30 Day Unplanned Readmission Risk Score    Flowsheet Row ED to Hosp-Admission (Current) from 11/05/2021 in [facility]  30 Day Unplanned Readmission Risk Score (%) 28.57 Filed at 11/11/2021 0801       This score is the patient's risk of an unplanned readmission within 30 days of being discharged (0 -100%). The score is based on dignosis, age, lab data, medications, orders, and past utilization.   Low:  0-14.9   Medium: 15-21.9   High: 22-29.9   Extreme: 30 and above          Admitted From: Home Disposition: Home  Recommendations for Outpatient Follow-up:  Follow up with PCP in 1-2 weeks Please obtain BMP/CBC in one week Follow-up with GI and hepatology, your GI will contact you for information regarding appointments. Please follow up with your PCP on the following pending results: Unresulted Labs (From admission, onward)    None         Home Health: None Equipment/Devices: None  Discharge Condition: Stable CODE STATUS: Full code Diet recommendation: Cardiac/low-sodium  Subjective: Seen and examined, still with abdominal pain and nausea but improving.  She is willing to go home.  Brief/Interim Summary: Victoria Gordon is a 41 y.o. female with medical history significant of Alcohol abuse, Alcoholic cirrhosis of liver with ascites, Anemia, Anxiety, Hemorrhoid, Hepatitis B, Major depressive disorder, PTSD, and Substance abuse who was discharged from [facility] on 11/03/2021 after having thoracentesis for pleural effusion.  During her hospitalization she received 1 unit of packed red blood cells for anemia with hemoglobin of 6.9.  GI was not consulted.  She was discharged home since she was stable.  She returned to ED with epigastric right upper quadrant  abdominal pain as well as pain in her right lateral chest wall with increased shortness of breath.  She also had nausea she reports having black tarry stools for the last few weeks along with rectal bright red bleed.  She does admit to having a occasional periods of confusion which she attributes to her liver disease. She has not had any fever or chills and has not had no known sick contacts. Has had decreased appetite the last 2 days.  She has jaundice of her skin which is unchanged for the past few weeks she states.     In the emergency room he has had soft blood pressure in the 80-100/43-56 range, pulse 50-60 respirations 15-27.  She has been afebrile.  She was started on cefepime and daptomycin for possible early sepsis with bradycardia and low blood pressures below 90 systolically..  She does have history of bacteremia. Lactic acid level 0.9, WBC 5100 hemoglobin 9. Patient was given albumin infusion in the emergency room.  Hospital service was asked to admit for further management   Large right-sided pleural effusion/hydrothorax: This is postthoracentesis with removal of 1600 cc fluid yesterday, patient feels better since then.  Fluid was negative for any infection.   Alcoholic cirrhosis of liver with ascites/possible SBP: Ultrasound abdomen did not show ascites.  No suspicion of SBP.  Ciprofloxacin for SBP prophylaxis was resumed.  Per GI Her cirrhosis is quite decompensated, MELD-Na 33 on arrival - large varices / portal hypertensive gastritis, chronic anemia, jaundice, hepatic encephalopathy, history of SBP, coagulopathic / thrombocytopenia. 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  Given her MELD / jaundice / hepatic encephalopathy she is not a good candidate for TIPS to manage her effusion, at least right now. In this light, management of hepatic hydrothorax would be to maximize her diuretics, as her renal function is fortunately normal.  She was started back on oral 40  mg p.o. Lasix and Aldactone 100 mg p.o. daily however her sodium dropped to 127 so GI recommended reducing both those diuretics and half the dose.  Her sodium improved to 129.  Per Dr. Fuller Plan, discharging this patient on half dose of those medications which is 20 mg of Lasix and 50 mg of Aldactone.  GI will follow up and they are arranging outpatient follow-[facility].  There was no source of infection found so antibiotics were discontinued and patient was resumed back on her home dose of ciprofloxacin which is SBP prophylaxis per GI recommendations.   Melena/bright red blood per rectum: Acute blood loss anemia with known history of esophageal varices: Her hemoglobin was 8.4 when she left [facility] on 11/03/2021, it dropped to 7.3 but then improved without transfusion and has remained stable.  Per GI, no plans for EGD or colonoscopy since her INR is high and platelets are very low.  She is high bleeding risk.  She is likely bleeding from hemorrhoids per GI.  Patient continues to complain of nausea and abdominal pain but tolerating diet.  Her nausea improved with Zofran.  She is being discharged on ODT Zofran.  Has appointment with Dr. Candis Schatz of GI on 12/03/2021.   Acute on chronic hyponatremia: 129 today.  Diuretic dose as above.   Hyperammonemia: Elevated but this is her baseline.   Hypotension: Blood pressure now improving, she is on midodrine, per GI this is acceptable blood pressure however they have recommended not resuming her beta-blocker.   Chronic pancytopenia: Likely secondary to marrow suppression due to chronic alcoholism.  Platelets at her baseline.  Avoid heparin products.   Hyperbilirubinemia Chronic and stable.  Secondary to cirrhosis.   Trichomonas vaginalis: Her UA is positive with trichomonas.  She was started on Flagyl which she has completed 5 5 days, she is being prescribed 2 more days of Flagyl.  Discharge plan was discussed with patient and/or  family member and they verbalized understanding and agreed with it.  Discharge Diagnoses:  Principal Problem:   Pleural effusion associated with hepatic disorder Active Problems:   Thrombocytopenia (HCC)   Hyperammonemia (HCC)   Alcoholic cirrhosis of liver with ascites (HCC)   Anemia   Hypotension   Hyperbilirubinemia   Esophageal varices without bleeding (HCC)   Nausea without vomiting    Discharge Instructions   Allergies as of 11/11/2021       Reactions   Vitamin K And Related Hives, Shortness Of Breath, Itching, Rash   Tolerated on 08/21/21 without reaction   Vancomycin Hives, Itching        Medication List     STOP taking these medications    propranolol 20 MG tablet Commonly known as: INDERAL       TAKE these medications    ciprofloxacin 500 MG tablet Commonly known as: CIPRO Take 1 tablet (500 mg total) by mouth daily.   folic acid 1 MG tablet Commonly known as: FOLVITE Take 1 tablet (1 mg total) by mouth daily.   furosemide 20 MG tablet Commonly known as: LASIX Take 1 tablet (20 mg total) by mouth daily. What changed:  medication strength how much to take  when to take this   hydrocortisone 25 MG suppository Commonly known as: ANUSOL-HC Place 1 suppository (25 mg total) rectally 2 (two) times daily.   lactulose 10 GM/15ML solution Commonly known as: CHRONULAC Take 30 mLs (20 g total) by mouth 2 (two) times daily.   metroNIDAZOLE 500 MG tablet Commonly known as: Flagyl Take 1 tablet (500 mg total) by mouth 3 (three) times daily for 2 days.   midodrine 10 MG tablet Commonly known as: PROAMATINE Take 1 tablet (10 mg total) by mouth 3 (three) times daily.   ondansetron 4 MG disintegrating tablet Commonly known as: ZOFRAN-ODT Take 1 tablet (4 mg total) by mouth every 8 (eight) hours as needed for nausea or vomiting.   pantoprazole 40 MG tablet Commonly known as: PROTONIX Take 1 tablet (40 mg total) by mouth daily.   rifaximin 550 MG  Tabs tablet Commonly known as: XIFAXAN Take 1 tablet (550 mg total) by mouth 2 (two) times daily.   spironolactone 50 MG tablet Commonly known as: ALDACTONE Take 1 tablet (50 mg total) by mouth daily. What changed:  medication strength how much to take when to take this        [facility] Follow up.   Why: Call to establish a priamry care doctor Contact information: [place] [address] [phone]               Allergies  Allergen Reactions   Vitamin K And Related Hives, Shortness Of Breath, Itching and Rash    Tolerated on 08/21/21 without reaction   Vancomycin Hives and Itching    Consultations: GI   Procedures/Studies: DG Chest 1 View  Result Date: 11/06/2021 CLINICAL DATA:  Status post right thoracentesis. EXAM: CHEST  1 VIEW COMPARISON:  11/05/2021. FINDINGS: Near complete evacuation of the right pleural effusion following thoracentesis. Minimal hazy opacity persists at the right lateral lung base. Remainder of the lungs is clear. No pneumothorax. IMPRESSION: 1. Near complete evacuation of the right pleural effusion following thoracentesis. 2. No pneumothorax. Electronically Signed   By: Lajean Manes M.D.   On: 11/06/2021 11:46   DG Chest 1 View  Result Date: 11/02/2021 CLINICAL DATA:  RIGHT pleural effusion post thoracentesis EXAM: CHEST  1 VIEW COMPARISON:  11/01/2021 FINDINGS: Extra tori technique. Decreased RIGHT pleural effusion when accounting for expiratory technique. Mild RIGHT basilar atelectasis. No pneumothorax following thoracentesis. Normal heart size and pulmonary vascularity. 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 LEFT lung clear. IMPRESSION: No pneumothorax following RIGHT thoracentesis. Electronically Signed   By: Lavonia Dana M.D.   On: 11/02/2021 11:08   DG Chest 2 View  Result Date: 11/05/2021 CLINICAL DATA:   Chest pain, shortness of breath. EXAM: CHEST - 2 VIEW COMPARISON:  November 02, 2021. FINDINGS: The heart size and mediastinal contours are within normal limits. Left lung is clear. Increased right pleural effusion is noted with associated right basilar atelectasis or infiltrate. The visualized skeletal structures are unremarkable. IMPRESSION: Increased right pleural effusion is noted with associated right basilar atelectasis or infiltrate. Electronically Signed   By: Marijo Conception M.D.   On: 11/05/2021 13:39   CT CHEST ABDOMEN PELVIS W CONTRAST  Result Date: 11/05/2021 CLINICAL DATA:  Worsened right back pain, swelling, recent thoracentesis, thrombocytopenia, abdominal pain EXAM: CT CHEST, ABDOMEN, AND PELVIS WITH CONTRAST TECHNIQUE: Multidetector CT imaging of the chest, abdomen and pelvis was performed following  the standard protocol during bolus administration of intravenous contrast. CONTRAST:  72mL OMNIPAQUE IOHEXOL 350 MG/ML SOLN COMPARISON:  08/20/2021, 11/05/2021 FINDINGS: CT CHEST FINDINGS Cardiovascular: The heart and great vessels are unremarkable without pericardial effusion. No evidence of thoracic aortic aneurysm or dissection. Mediastinum/Nodes: No enlarged mediastinal, hilar, or axillary lymph nodes. Thyroid gland, trachea, and esophagus demonstrate no significant findings. Large gastric and esophageal varices are noted. Lungs/Pleura: Large right pleural effusion volume estimated approximately 2 L. compressive atelectasis within the dependent right lung. Left chest is clear. No pneumothorax. Musculoskeletal: There are no acute or destructive bony lesions. Subcutaneous edema within the lower back and bilateral flanks. Reconstructed images demonstrate no additional findings. CT ABDOMEN PELVIS FINDINGS Hepatobiliary: Nodular shrunken appearance of the liver consistent with cirrhosis. No focal liver abnormality on this single phase exam. Gallbladder is surgically absent. Pancreas: Unremarkable. No  pancreatic ductal dilatation or surrounding inflammatory changes. Spleen: The spleen is enlarged, measuring approximately 15.6 cm in craniocaudal length. No focal parenchymal abnormalities. Adrenals/Urinary Tract: Adrenal glands are unremarkable. Kidneys are normal, without renal calculi, focal lesion, or hydronephrosis. Bladder is unremarkable. Stomach/Bowel: No bowel obstruction or ileus. Normal gas-filled appendix right lower quadrant. No bowel wall thickening or inflammatory change. 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 Splenic vein, portal vein, and SMV are patent. No other significant vascular findings. No pathologic adenopathy. Reproductive: Uterus and bilateral adnexa are unremarkable. Other: Small volume ascites. No free intraperitoneal gas. There is diffuse body wall edema. No abdominal wall hernia. Musculoskeletal: No acute or destructive bony lesions. Reconstructed images demonstrate no additional findings. IMPRESSION: 1. Large right pleural effusion volume estimated 2 L. No complication after reported right thoracentesis. 2. Cirrhosis, with portal venous hypertension manifested by large gastric/esophageal varices, splenomegaly, and small volume ascites. 3. Diffuse body wall edema. Electronically Signed   By: Randa Ngo M.D.   On: 11/05/2021 18:10   US Abdomen Limited  Result Date: 11/06/2021 CLINICAL DATA:  Assess for ascites. EXAM: LIMITED ABDOMEN ULTRASOUND FOR ASCITES TECHNIQUE: Limited ultrasound survey for ascites was performed in all four abdominal quadrants. COMPARISON:  None. FINDINGS: Four quadrant survey of the abdomen shows no significant ascites. IMPRESSION: No significant ascites. Electronically Signed   By: Kerby Moors M.D.   On: 11/06/2021 11:56   DG CHEST PORT 1 VIEW  Result Date: 11/01/2021 CLINICAL DATA:  Shortness of breath.  Cirrhosis. EXAM: PORTABLE CHEST 1 VIEW COMPARISON:  CT chest abdomen and pelvis  08/20/2021. Chest x-ray 08/20/2021. FINDINGS: There is a moderate to large right pleural effusion which has increased. Left lung is clear. Cardiomediastinal silhouette within normal limits. No pneumothorax. No acute fractures. IMPRESSION: 1. Moderate to large right pleural effusion has increased. Can not exclude underlying atelectasis/airspace disease in the right lung base. Electronically Signed   By: Ronney Asters M.D.   On: 11/01/2021 17:19   [place] ASCITES (ABDOMEN LIMITED)  Result Date: 11/02/2021 CLINICAL DATA:  Cirrhosis, ascites, question sufficient for paracentesis EXAM: LIMITED ABDOMEN ULTRASOUND FOR ASCITES TECHNIQUE: Limited ultrasound survey for ascites was performed in all four abdominal quadrants. COMPARISON:  CT abdomen and pelvis 08/20/2021 FINDINGS: Trace ascites identified upon survey imaging of the 4 quadrants. Volume of ascites is insufficient for paracentesis. IMPRESSION: Trace ascites, insufficient for paracentesis. Electronically Signed   By: Lavonia Dana M.D.   On: 11/02/2021 09:53   US THORACENTESIS ASP PLEURAL SPACE W/IMG GUIDE  Result Date: 11/06/2021 INDICATION: Shortness of breast, history of cirrhosis, pleural effusion seen on previous chest x-ray. Request for  therapeutic and diagnostic thoracentesis. EXAM: ULTRASOUND GUIDED RIGHT THORACENTESIS MEDICATIONS: 10 mL 1% lidocaine COMPLICATIONS: None immediate. PROCEDURE: An ultrasound guided thoracentesis was thoroughly discussed with the patient and questions answered. The benefits, risks, alternatives and complications were also discussed. The patient understands and wishes to proceed with the procedure. Written consent was obtained. Ultrasound was performed to localize and mark an adequate pocket of fluid in the right chest. The area was then prepped and draped in the normal sterile fashion. 1% Lidocaine was used for local anesthesia. Under ultrasound guidance a 6 Fr Safe-T-Centesis catheter was introduced. Thoracentesis was performed.  The catheter was removed and a dressing applied. FINDINGS: A total of approximately 1,650 cc of hazy, orange colored fluid was removed. Samples were sent to the laboratory as requested by the clinical team. Post procedure chest X-ray reviewed, negative for pneumothorax. IMPRESSION: Successful ultrasound guided right thoracentesis yielding 1,650 cc of pleural fluid. Read by: Durenda Guthrie, PA-C Electronically Signed   By: Jerilynn Mages.  Shick M.D.   On: 11/06/2021 12:42   US THORACENTESIS ASP PLEURAL SPACE W/IMG GUIDE  Result Date: 11/02/2021 INDICATION: Cirrhosis, RIGHT pleural effusion EXAM: ULTRASOUND GUIDED DIAGNOSTIC AND THERAPEUTIC RIGHT THORACENTESIS MEDICATIONS: None. COMPLICATIONS: None immediate. PROCEDURE: An ultrasound guided thoracentesis was thoroughly discussed with the patient and questions answered. The benefits, risks, alternatives and complications were also discussed. The patient understands and wishes to proceed with the procedure. Written consent was obtained. Ultrasound was performed to localize and mark an adequate pocket of fluid in the RIGHT chest. The area was then prepped and draped in the normal sterile fashion. 1% Lidocaine was used for local anesthesia. Under ultrasound guidance a 8 French thoracentesis catheter was introduced. Thoracentesis was performed. The catheter was removed and a dressing applied. FINDINGS: A total of approximately 1.55 L of serosanguineous fluid was removed. Samples were sent to the laboratory as requested by the clinical team. IMPRESSION: Successful ultrasound guided RIGHT thoracentesis yielding 1.55 L of pleural fluid. Electronically Signed   By: Lavonia Dana M.D.   On: 11/02/2021 11:06     Discharge Exam: Vitals:   11/11/21 0000 11/11/21 0054  BP:  103/62  Pulse:  66  Resp: 20 19  Temp:  98.3 F (36.8 C)  SpO2:  98%   Vitals:   11/10/21 1139 11/10/21 2056 11/11/21 0000 11/11/21 0054  BP: (!) 105/56 111/61  103/62  Pulse: 68 70  66  Resp: (!) 24 19 20 19    Temp: 98.1 F (36.7 C) 98.1 F (36.7 C)  98.3 F (36.8 C)  TempSrc: Oral Oral  Oral  SpO2: 97% 97%  98%  Weight:      Height:        General: Pt is alert, awake, not in acute distress, icteric Cardiovascular: RRR, S1/S2 +, no rubs, no gallops Respiratory: CTA bilaterally, no wheezing, no rhonchi Abdominal: Soft, mild generalized tenderness, ND, bowel sounds + Extremities: no edema, no cyanosis    The results of significant diagnostics from this hospitalization (including imaging, microbiology, ancillary and laboratory) are listed below for reference.     Microbiology: Recent Results (from the past 240 hour(s))  Resp Panel by RT-PCR (Flu A&B, Covid) Nasopharyngeal Swab     Status: None   Collection Time: 11/01/21  5:30 PM   Specimen: Nasopharyngeal Swab; Nasopharyngeal(NP) swabs in vial transport medium  Result Value Ref Range Status   SARS Coronavirus 2 by RT PCR NEGATIVE NEGATIVE Final    Comment: (NOTE) SARS-CoV-2 target nucleic acids are NOT DETECTED.  The SARS-CoV-2 RNA is generally detectable in upper respiratory specimens during the acute phase of infection. The lowest concentration of SARS-CoV-2 viral copies this assay can detect is 138 copies/mL. A negative result does not preclude SARS-Cov-2 infection and should not be used as the sole basis for treatment or other patient management decisions. A negative result may occur with  improper specimen collection/handling, submission of specimen other than nasopharyngeal swab, presence of viral mutation(s) within the areas targeted by this assay, and inadequate number of viral copies(<138 copies/mL). A negative result must be combined with clinical observations, patient history, and epidemiological information. The expected result is Negative.  Fact Sheet for Patients:  EntrepreneurPulse.com.au  Fact Sheet for Healthcare Providers:  IncredibleEmployment.be  This test is no t yet  approved or cleared by the [place] FDA and  has been authorized for detection and/or diagnosis of SARS-CoV-2 by FDA under an Emergency Use Authorization (EUA). This EUA will remain  in effect (meaning this test can be used) for the duration of the COVID-19 declaration under Section 564(b)(1) of the Act, 21 U.S.C.section 360bbb-3(b)(1), unless the authorization is terminated  or revoked sooner.       Influenza A by PCR NEGATIVE NEGATIVE Final   Influenza B by PCR NEGATIVE NEGATIVE Final    Comment: (NOTE) The Xpert Xpress SARS-CoV-2/FLU/RSV plus assay is intended as an aid in the diagnosis of influenza from Nasopharyngeal swab specimens and should not be used as a sole basis for treatment. Nasal washings and aspirates are unacceptable for Xpert Xpress SARS-CoV-2/FLU/RSV testing.  Fact Sheet for Patients: EntrepreneurPulse.com.au  Fact Sheet for Healthcare Providers: IncredibleEmployment.be  This test is not yet approved or cleared by the [place] FDA and has been authorized for detection and/or diagnosis of SARS-CoV-2 by FDA under an Emergency Use Authorization (EUA). This EUA will remain in effect (meaning this test can be used) for the duration of the COVID-19 declaration under Section 564(b)(1) of the Act, 21 U.S.C. section 360bbb-3(b)(1), unless the authorization is terminated or revoked.  Performed at [facility], [address] [place], [place] [address]   Gram stain     Status: None   Collection Time: 11/02/21 10:35 AM   Specimen: Pleura  Result Value Ref Range Status   Specimen Description PLEURAL  Final   Special Requests NONE  Final   Gram Stain   Final    NO ORGANISMS SEEN WBC PRESENT,BOTH PMN AND MONONUCLEAR CYTOSPIN SMEAR Performed at [facility], [address] [place], [place] [address]    Report Status 11/02/2021 FINAL  Final  Culture, body fluid w Gram Stain-bottle     Status: None   Collection Time:  11/02/21 10:35 AM   Specimen: Pleura  Result Value Ref Range Status   Specimen Description PLEURAL  Final   Special Requests BOTTLES DRAWN AEROBIC AND ANAEROBIC 10 CC  Final   Gram Stain   Final    NO ORGANISMS SEEN WBC PRESENT,BOTH PMN AND MONONUCLEAR CYTOSPIN SMEAR    Culture   Final    NO GROWTH < 12 HOURS Performed at [facility], [address] [place], [place] [address]    Report Status 11/02/2021 FINAL  Final  Urine Culture     Status: None   Collection Time: 11/05/21  1:50 PM   Specimen: Urine, Clean Catch  Result Value Ref Range Status   Specimen Description URINE, CLEAN CATCH  Final   Special Requests NONE  Final   Culture   Final    NO  GROWTH Performed at [facility], [place] [address], [place], [place] [address]    Report Status 11/07/2021 FINAL  Final  Blood culture (routine x 2)     Status: None   Collection Time: 11/05/21  4:09 PM   Specimen: BLOOD  Result Value Ref Range Status   Specimen Description BLOOD SITE NOT SPECIFIED  Final   Special Requests   Final    BOTTLES DRAWN AEROBIC AND ANAEROBIC Blood Culture adequate volume   Culture   Final    NO GROWTH 5 DAYS Performed at [facility], [place] [address], [place], [place] [address]    Report Status 11/10/2021 FINAL  Final  Resp Panel by RT-PCR (Flu A&B, Covid) Nasopharyngeal Swab     Status: None   Collection Time: 11/05/21 11:10 PM   Specimen: Nasopharyngeal Swab; Nasopharyngeal(NP) swabs in vial transport medium  Result Value Ref Range Status   SARS Coronavirus 2 by RT PCR NEGATIVE NEGATIVE Final    Comment: (NOTE) SARS-CoV-2 target nucleic acids are NOT DETECTED.  The SARS-CoV-2 RNA is generally detectable in upper respiratory specimens during the acute phase of infection. The lowest concentration of SARS-CoV-2 viral copies this assay can detect is 138 copies/mL. A negative result does not preclude SARS-Cov-2 infection and should not be used as the sole basis for treatment or other  patient management decisions. A negative result may occur with  improper specimen collection/handling, submission of specimen other than nasopharyngeal swab, presence of viral mutation(s) within the areas targeted by this assay, and inadequate number of viral copies(<138 copies/mL). A negative result must be combined with clinical observations, patient history, and epidemiological information. The expected result is Negative.  Fact Sheet for Patients:  EntrepreneurPulse.com.au  Fact Sheet for Healthcare Providers:  IncredibleEmployment.be  This test is no t yet approved or cleared by the [place] FDA and  has been authorized for detection and/or diagnosis of SARS-CoV-2 by FDA under an Emergency Use Authorization (EUA). This EUA will remain  in effect (meaning this test can be used) for the duration of the COVID-19 declaration under Section 564(b)(1) of the Act, 21 U.S.C.section 360bbb-3(b)(1), unless the authorization is terminated  or revoked sooner.       Influenza A by PCR NEGATIVE NEGATIVE Final   Influenza B by PCR NEGATIVE NEGATIVE Final    Comment: (NOTE) The Xpert Xpress SARS-CoV-2/FLU/RSV plus assay is intended as an aid in the diagnosis of influenza from Nasopharyngeal swab specimens and should not be used as a sole basis for treatment. Nasal washings and aspirates are unacceptable for Xpert Xpress SARS-CoV-2/FLU/RSV testing.  Fact Sheet for Patients: EntrepreneurPulse.com.au  Fact Sheet for Healthcare Providers: IncredibleEmployment.be  This test is not yet approved or cleared by the [place] FDA and has been authorized for detection and/or diagnosis of SARS-CoV-2 by FDA under an Emergency Use Authorization (EUA). This EUA will remain in effect (meaning this test can be used) for the duration of the COVID-19 declaration under Section 564(b)(1) of the Act, 21 U.S.C. section  360bbb-3(b)(1), unless the authorization is terminated or revoked.  Performed at [facility], [place] [address], [place], [place] [address]   Culture, body fluid w Gram Stain-bottle     Status: None (Preliminary result)   Collection Time: 11/06/21 12:08 PM   Specimen: Pleura  Result Value Ref Range Status   Specimen Description PLEURAL RIGHT  Final   Special Requests NONE  Final   Culture   Final    NO  GROWTH 4 DAYS Performed at [facility], [place] [address], [place], [place] [address]    Report Status PENDING  Incomplete  Gram stain     Status: None   Collection Time: 11/06/21 12:08 PM   Specimen: Pleura  Result Value Ref Range Status   Specimen Description PLEURAL RIGHT  Final   Special Requests NONE  Final   Gram Stain   Final    WBC PRESENT,BOTH PMN AND MONONUCLEAR NO ORGANISMS SEEN CYTOSPIN SMEAR Performed at [facility], [address] [place], [place] [address]    Report Status 11/06/2021 FINAL  Final     Labs: BNP (last 3 results) No results for input(s): BNP in the last 8760 hours. Basic Metabolic Panel: Recent Labs  Lab 11/07/21 0541 11/08/21 0531 11/09/21 0452 11/10/21 0619 11/11/21 0228  NA 131* 131* 129* 127* 129*  K 4.1 4.1 3.9 4.0 4.0  CL 107 106 105 103 104  CO2 20* 20* 20* 18* 18*  GLUCOSE 92 79 90 82 95  BUN 11 9 10 7 7   CREATININE 0.82 0.76 0.85 0.70 0.80  CALCIUM 7.8* 7.7* 7.3* 7.6* 7.5*  MG 1.9  --   --   --   --    Liver Function Tests: Recent Labs  Lab 11/07/21 0541 11/08/21 0531 11/09/21 0452 11/10/21 0619 11/11/21 0228  AST 38 48* 40 44* 44*  ALT 18 21 19 18 20   ALKPHOS 122 102 134* 133* 103  BILITOT 11.9* 13.4* 12.5* 12.6* 13.0*  PROT 4.5* 4.8* 4.7* 4.8* 4.5*  ALBUMIN 1.9* 1.9* 1.8* 1.9* 1.8*   Recent Labs  Lab 11/05/21 1500  LIPASE 60*   Recent Labs  Lab 11/05/21 1314  AMMONIA 49*   CBC: Recent Labs  Lab 11/07/21 0541 11/07/21 1629 11/07/21 1759 11/08/21 0531 11/08/21 1651 11/09/21 0452  11/10/21 0619  WBC 3.1* 3.6* 4.0 3.7* 5.2 3.7* 3.1*  NEUTROABS 2.1 2.4  --  2.1 3.2 2.2  --   HGB 7.3* 7.8* 7.9* 8.0* 8.5* 7.4* 7.7*  HCT 23.8* 24.1* 26.0* 24.6* 27.5* 23.2* 23.3*  MCV 92.2 92.0 94.2 90.4 92.9 90.6 90.3  PLT 31* 34* 39* 36* 47* 39* 37*   Cardiac Enzymes: No results for input(s): CKTOTAL, CKMB, CKMBINDEX, TROPONINI in the last 168 hours. BNP: Invalid input(s): POCBNP CBG: No results for input(s): GLUCAP in the last 168 hours. D-Dimer No results for input(s): DDIMER in the last 72 hours. Hgb A1c No results for input(s): HGBA1C in the last 72 hours. Lipid Profile No results for input(s): CHOL, HDL, LDLCALC, TRIG, CHOLHDL, LDLDIRECT in the last 72 hours. Thyroid function studies No results for input(s): TSH, T4TOTAL, T3FREE, THYROIDAB in the last 72 hours.  Invalid input(s): FREET3 Anemia work up No results for input(s): VITAMINB12, FOLATE, FERRITIN, TIBC, IRON, RETICCTPCT in the last 72 hours. Urinalysis    Component Value Date/Time   COLORURINE ORANGE (A) 11/05/2021 2011   APPEARANCEUR CLOUDY (A) 11/05/2021 2011   LABSPEC  11/05/2021 2011    TEST NOT REPORTED DUE TO COLOR INTERFERENCE OF URINE PIGMENT   PHURINE  11/05/2021 2011    TEST NOT REPORTED DUE TO COLOR INTERFERENCE OF URINE PIGMENT   GLUCOSEU (A) 11/05/2021 2011    TEST NOT REPORTED DUE TO COLOR INTERFERENCE OF URINE PIGMENT   HGBUR (A) 11/05/2021 2011    TEST NOT REPORTED DUE TO COLOR INTERFERENCE OF URINE PIGMENT   BILIRUBINUR (A) 11/05/2021 2011    TEST NOT REPORTED DUE TO COLOR INTERFERENCE  OF URINE PIGMENT   KETONESUR (A) 11/05/2021 2011    TEST NOT REPORTED DUE TO COLOR INTERFERENCE OF URINE PIGMENT   PROTEINUR (A) 11/05/2021 2011    TEST NOT REPORTED DUE TO COLOR INTERFERENCE OF URINE PIGMENT   UROBILINOGEN 0.2 02/17/2014 1509   NITRITE (A) 11/05/2021 2011    TEST NOT REPORTED DUE TO COLOR INTERFERENCE OF URINE PIGMENT   LEUKOCYTESUR (A) 11/05/2021 2011    TEST NOT REPORTED DUE TO COLOR  INTERFERENCE OF URINE PIGMENT   Sepsis Labs Invalid input(s): PROCALCITONIN,  WBC,  LACTICIDVEN Microbiology Recent Results (from the past 240 hour(s))  Resp Panel by RT-PCR (Flu A&B, Covid) Nasopharyngeal Swab     Status: None   Collection Time: 11/01/21  5:30 PM   Specimen: Nasopharyngeal Swab; Nasopharyngeal(NP) swabs in vial transport medium  Result Value Ref Range Status   SARS Coronavirus 2 by RT PCR NEGATIVE NEGATIVE Final    Comment: (NOTE) SARS-CoV-2 target nucleic acids are NOT DETECTED.  The SARS-CoV-2 RNA is generally detectable in upper respiratory specimens during the acute phase of infection. The lowest concentration of SARS-CoV-2 viral copies this assay can detect is 138 copies/mL. A negative result does not preclude SARS-Cov-2 infection and should not be used as the sole basis for treatment or other patient management decisions. A negative result may occur with  improper specimen collection/handling, submission of specimen other than nasopharyngeal swab, presence of viral mutation(s) within the areas targeted by this assay, and inadequate number of viral copies(<138 copies/mL). A negative result must be combined with clinical observations, patient history, and epidemiological information. The expected result is Negative.  Fact Sheet for Patients:  EntrepreneurPulse.com.au  Fact Sheet for Healthcare Providers:  IncredibleEmployment.be  This test is no t yet approved or cleared by the [place] FDA and  has been authorized for detection and/or diagnosis of SARS-CoV-2 by FDA under an Emergency Use Authorization (EUA). This EUA will remain  in effect (meaning this test can be used) for the duration of the COVID-19 declaration under Section 564(b)(1) of the Act, 21 U.S.C.section 360bbb-3(b)(1), unless the authorization is terminated  or revoked sooner.       Influenza A by PCR NEGATIVE NEGATIVE Final   Influenza B by PCR  NEGATIVE NEGATIVE Final    Comment: (NOTE) The Xpert Xpress SARS-CoV-2/FLU/RSV plus assay is intended as an aid in the diagnosis of influenza from Nasopharyngeal swab specimens and should not be used as a sole basis for treatment. Nasal washings and aspirates are unacceptable for Xpert Xpress SARS-CoV-2/FLU/RSV testing.  Fact Sheet for Patients: EntrepreneurPulse.com.au  Fact Sheet for Healthcare Providers: IncredibleEmployment.be  This test is not yet approved or cleared by the [place] FDA and has been authorized for detection and/or diagnosis of SARS-CoV-2 by FDA under an Emergency Use Authorization (EUA). This EUA will remain in effect (meaning this test can be used) for the duration of the COVID-19 declaration under Section 564(b)(1) of the Act, 21 U.S.C. section 360bbb-3(b)(1), unless the authorization is terminated or revoked.  Performed at [facility], [address] [place], [place] [address]   Gram stain     Status: None   Collection Time: 11/02/21 10:35 AM   Specimen: Pleura  Result Value Ref Range Status   Specimen Description PLEURAL  Final   Special Requests NONE  Final   Gram Stain   Final    NO ORGANISMS SEEN WBC PRESENT,BOTH PMN AND MONONUCLEAR CYTOSPIN SMEAR Performed at [facility], [address] [place], [place] [address]  Report Status 11/02/2021 FINAL  Final  Culture, body fluid w Gram Stain-bottle     Status: None   Collection Time: 11/02/21 10:35 AM   Specimen: Pleura  Result Value Ref Range Status   Specimen Description PLEURAL  Final   Special Requests BOTTLES DRAWN AEROBIC AND ANAEROBIC 10 CC  Final   Gram Stain   Final    NO ORGANISMS SEEN WBC PRESENT,BOTH PMN AND MONONUCLEAR CYTOSPIN SMEAR    Culture   Final    NO GROWTH < 12 HOURS Performed at [facility], [address] [place] [address], [place], [place] [address]    Report Status 11/02/2021 FINAL  Final  Urine Culture     Status: None   Collection Time:  11/05/21  1:50 PM   Specimen: Urine, Clean Catch  Result Value Ref Range Status   Specimen Description URINE, CLEAN CATCH  Final   Special Requests NONE  Final   Culture   Final    NO GROWTH Performed at [facility], [place] [address] [place], [place] [address]    Report Status 11/07/2021 FINAL  Final  Blood culture (routine x 2)     Status: None   Collection Time: 11/05/21  4:09 PM   Specimen: BLOOD  Result Value Ref Range Status   Specimen Description BLOOD SITE NOT SPECIFIED  Final   Special Requests   Final    BOTTLES DRAWN AEROBIC AND ANAEROBIC Blood Culture adequate volume   Culture   Final    NO GROWTH 5 DAYS Performed at [facility], [place] [address], [place], [place] [address]    Report Status 11/10/2021 FINAL  Final  Resp Panel by RT-PCR (Flu A&B, Covid) Nasopharyngeal Swab     Status: None   Collection Time: 11/05/21 11:10 PM   Specimen: Nasopharyngeal Swab; Nasopharyngeal(NP) swabs in vial transport medium  Result Value Ref Range Status   SARS Coronavirus 2 by RT PCR NEGATIVE NEGATIVE Final    Comment: (NOTE) SARS-CoV-2 target nucleic acids are NOT DETECTED.  The SARS-CoV-2 RNA is generally detectable in upper respiratory specimens during the acute phase of infection. The lowest concentration of SARS-CoV-2 viral copies this assay can detect is 138 copies/mL. A negative result does not preclude SARS-Cov-2 infection and should not be used as the sole basis for treatment or other patient management decisions. A negative result may occur with  improper specimen collection/handling, submission of specimen other than nasopharyngeal swab, presence of viral mutation(s) within the areas targeted by this assay, and inadequate number of viral copies(<138 copies/mL). A negative result must be combined with clinical observations, patient history, and epidemiological information. The expected result is Negative.  Fact Sheet for Patients:   EntrepreneurPulse.com.au  Fact Sheet for Healthcare Providers:  IncredibleEmployment.be  This test is no t yet approved or cleared by the [place] FDA and  has been authorized for detection and/or diagnosis of SARS-CoV-2 by FDA under an Emergency Use Authorization (EUA). This EUA will remain  in effect (meaning this test can be used) for the duration of the COVID-19 declaration under Section 564(b)(1) of the Act, 21 U.S.C.section 360bbb-3(b)(1), unless the authorization is terminated  or revoked sooner.       Influenza A by PCR NEGATIVE NEGATIVE Final   Influenza B by PCR NEGATIVE NEGATIVE Final    Comment: (NOTE) The Xpert Xpress SARS-CoV-2/FLU/RSV plus assay is intended as an aid in the diagnosis of influenza from Nasopharyngeal swab specimens and should not be used as a sole basis  for treatment. Nasal washings and aspirates are unacceptable for Xpert Xpress SARS-CoV-2/FLU/RSV testing.  Fact Sheet for Patients: EntrepreneurPulse.com.au  Fact Sheet for Healthcare Providers: IncredibleEmployment.be  This test is not yet approved or cleared by the [place] FDA and has been authorized for detection and/or diagnosis of SARS-CoV-2 by FDA under an Emergency Use Authorization (EUA). This EUA will remain in effect (meaning this test can be used) for the duration of the COVID-19 declaration under Section 564(b)(1) of the Act, 21 U.S.C. section 360bbb-3(b)(1), unless the authorization is terminated or revoked.  Performed at [facility], [place] [address], [place], [place] [address]   Culture, body fluid w Gram Stain-bottle     Status: None (Preliminary result)   Collection Time: 11/06/21 12:08 PM   Specimen: Pleura  Result Value Ref Range Status   Specimen Description PLEURAL RIGHT  Final   Special Requests NONE  Final   Culture   Final    NO GROWTH 4 DAYS Performed at [place] [address] [place], [place] [address]    Report Status PENDING  Incomplete  Gram stain     Status: None   Collection Time: 11/06/21 12:08 PM   Specimen: Pleura  Result Value Ref Range Status   Specimen Description PLEURAL RIGHT  Final   Special Requests NONE  Final   Gram Stain   Final    WBC PRESENT,BOTH PMN AND MONONUCLEAR NO ORGANISMS SEEN CYTOSPIN SMEAR Performed at [facility], [address], [place], [place] [address]    Report Status 11/06/2021 FINAL  Final     Time coordinating discharge: Over 30 minutes  SIGNED:   Darliss Cheney, MD  [facility] 11/11/2021, 11:13 AM  If 7PM-7AM, please contact night-coverage [email or web address]

## 2021-11-18 ENCOUNTER — Other Ambulatory Visit (HOSPITAL_COMMUNITY): Payer: Self-pay | Admitting: Nurse Practitioner

## 2021-11-18 ENCOUNTER — Other Ambulatory Visit: Payer: Self-pay | Admitting: Nurse Practitioner

## 2021-11-18 DIAGNOSIS — J918 Pleural effusion in other conditions classified elsewhere: Secondary | ICD-10-CM

## 2021-11-23 ENCOUNTER — Other Ambulatory Visit (HOSPITAL_COMMUNITY): Payer: Self-pay | Admitting: Nurse Practitioner

## 2021-11-23 ENCOUNTER — Ambulatory Visit (HOSPITAL_COMMUNITY)
Admission: RE | Admit: 2021-11-23 | Discharge: 2021-11-23 | Disposition: A | Discharge status: Home / Self Care | Payer: Medicaid Other | Source: Ambulatory Visit | Attending: Nurse Practitioner | Admitting: Nurse Practitioner

## 2021-11-23 ENCOUNTER — Other Ambulatory Visit: Payer: Self-pay

## 2021-11-23 DIAGNOSIS — J90 Pleural effusion, not elsewhere classified: Secondary | ICD-10-CM | POA: Insufficient documentation

## 2021-11-23 DIAGNOSIS — J918 Pleural effusion in other conditions classified elsewhere: Secondary | ICD-10-CM | POA: Insufficient documentation

## 2021-11-23 DIAGNOSIS — K769 Liver disease, unspecified: Secondary | ICD-10-CM | POA: Diagnosis not present

## 2021-11-23 HISTORY — PX: IR THORACENTESIS ASP PLEURAL SPACE W/IMG GUIDE: IMG5380

## 2021-11-23 MED ORDER — LIDOCAINE HCL 1 % IJ SOLN
INTRAMUSCULAR | Status: DC | PRN
  Administered 2021-11-23: 10 mL

## 2021-11-23 NOTE — Procedures (Signed)
Ultrasound-guided therapeutic right thoracentesis performed yielding 2 liters of amber colored fluid. No immediate complications. Follow-up chest x-ray pending. EBL is < 2 ml. Maximum 2 liter

## 2021-12-03 ENCOUNTER — Emergency Department (HOSPITAL_COMMUNITY)
Admission: EM | Admit: 2021-12-03 | Discharge: 2021-12-04 | Disposition: A | Discharge status: Home / Self Care | Payer: Medicaid Other | Attending: Emergency Medicine | Admitting: Emergency Medicine

## 2021-12-03 ENCOUNTER — Ambulatory Visit: Payer: Medicaid Other | Admitting: Gastroenterology

## 2021-12-03 ENCOUNTER — Other Ambulatory Visit: Payer: Self-pay

## 2021-12-03 ENCOUNTER — Encounter (HOSPITAL_COMMUNITY): Payer: Self-pay

## 2021-12-03 DIAGNOSIS — Y908 Blood alcohol level of 240 mg/100 ml or more: Secondary | ICD-10-CM | POA: Insufficient documentation

## 2021-12-03 DIAGNOSIS — K7460 Unspecified cirrhosis of liver: Secondary | ICD-10-CM | POA: Diagnosis not present

## 2021-12-03 DIAGNOSIS — F10929 Alcohol use, unspecified with intoxication, unspecified: Secondary | ICD-10-CM | POA: Insufficient documentation

## 2021-12-03 DIAGNOSIS — N390 Urinary tract infection, site not specified: Secondary | ICD-10-CM | POA: Insufficient documentation

## 2021-12-03 NOTE — ED Provider Notes (Signed)
[facility] Emergency Department Provider Note MRN:  [id number]  Arrival date & time: 12/04/21     Chief Complaint   Ingestion and Alcohol Problem   History of Present Illness   Victoria Gordon is a 41 y.o. year-old female with a history of alcoholic cirrhosis presenting to the ED with chief complaint of ingestion and alcohol problem.  Patient had been sober since October but relapsed today and drank heavily.  History of cirrhosis.  She explains that she was drunk and confused and probably took too many of her rifaximin tablets today.  She is unsure how many she took but it was less than a handful, she denies intentions of self-harm.  Denies any other drugs or overuse of any of her home medications.  She is upset because she wants a liver transplant and this is a big setback relapsing on alcohol.  She denies any significant pain at this time.  Review of Systems  A thorough review of systems was obtained and all systems are negative except as noted in the HPI and PMH.   Patient's Health History    Past Medical History:  Diagnosis Date   Alcohol abuse    Alcoholic cirrhosis of liver with ascites ([place])    Anemia    Anxiety    Hemorrhoid    Hepatitis B    Major depressive disorder    PTSD (post-traumatic stress disorder)    Substance abuse ([place])     Past Surgical History:  Procedure Laterality Date   BIOPSY  08/29/2021   Procedure: BIOPSY;  Surgeon: Daryel November, MD;  Location: [place];  Service: Gastroenterology;;   CESAREAN SECTION     CHOLECYSTECTOMY     COLONOSCOPY WITH PROPOFOL N/A 08/29/2021   Procedure: COLONOSCOPY WITH PROPOFOL;  Surgeon: Daryel November, MD;  Location: [place];  Service: Gastroenterology;  Laterality: N/A;   ESOPHAGOGASTRODUODENOSCOPY (EGD) WITH PROPOFOL N/A 08/29/2021   Procedure: ESOPHAGOGASTRODUODENOSCOPY (EGD) WITH PROPOFOL;  Surgeon: Daryel November, MD;  Location: [place];  Service:  Gastroenterology;  Laterality: N/A;   IR THORACENTESIS ASP PLEURAL SPACE W/IMG GUIDE  11/23/2021   MOUTH SURGERY     TUBAL LIGATION Bilateral 07/22/2014   Procedure: POST PARTUM TUBAL LIGATION;  Surgeon: Woodroe Mode, MD;  Location: [facility];  Service: Gynecology;  Laterality: Bilateral;    Family History  Problem Relation Age of Onset   Diabetes Maternal Grandmother    Diabetes Paternal Grandfather     Social History   Socioeconomic History   Marital status: Single    Spouse name: Not on file   Number of children: Not on file   Years of education: Not on file   Highest education level: Not on file  Occupational History   Not on file  Tobacco Use   Smoking status: Never   Smokeless tobacco: Never  Vaping Use   Vaping Use: Never used  Substance and Sexual Activity   Alcohol use: Yes    Comment: 12/03/2021   Drug use: No   Sexual activity: Yes    Birth control/protection: None  Other Topics Concern   Not on file  Social History Narrative   Not on file   Social Determinants of Health   Financial Resource Strain: Not on file  Food Insecurity: Not on file  Transportation Needs: Not on file  Physical Activity: Not on file  Stress: Not on file  Social Connections: Not on file  Intimate Partner Violence: Not on file  Physical Exam   Vitals:   12/04/21 0400 12/04/21 0430  BP: 117/66 120/68  Pulse: (!) 103 100  Resp: 20 18  Temp:  98.9 F (37.2 C)  SpO2: 93% 95%    CONSTITUTIONAL: Chronically ill-appearing, NAD NEURO/PSYCH:  Alert and oriented x 3, no focal deficits EYES:  eyes equal and reactive, scleral icterus noted ENT/NECK:  no LAD, no JVD CARDIO: Regular rate, well-perfused, normal S1 and S2 PULM:  CTAB no wheezing or rhonchi GI/GU:  non-distended, non-tender MSK/SPINE:  No gross deformities, no edema SKIN:  no rash, atraumatic   *Additional and/or pertinent findings included in MDM below  Diagnostic and Interventional Summary    EKG  Interpretation  Date/Time:  Friday December 03 2021 22:59:48 EST Ventricular Rate:  103 PR Interval:  156 QRS Duration: 105 QT Interval:  362 QTC Calculation: 474 R Axis:   -38 Text Interpretation: Sinus or ectopic atrial tachycardia Left axis deviation Low voltage, precordial leads Consider anterior infarct Confirmed by Gerlene Fee [phone]) on 12/03/2021 11:54:34 PM       Labs Reviewed  CBC - Abnormal; Notable for the following components:      Result Value   RBC 3.37 (*)    Hemoglobin 10.7 (*)    HCT 33.4 (*)    RDW 18.6 (*)    Platelets 59 (*)    All other components within normal limits  COMPREHENSIVE METABOLIC PANEL - Abnormal; Notable for the following components:   Sodium 131 (*)    CO2 17 (*)    Glucose, Bld 111 (*)    Calcium 7.7 (*)    Albumin 2.7 (*)    AST 69 (*)    Alkaline Phosphatase 217 (*)    Total Bilirubin 15.5 (*)    All other components within normal limits  LIPASE, BLOOD - Abnormal; Notable for the following components:   Lipase 66 (*)    All other components within normal limits  PROTIME-INR - Abnormal; Notable for the following components:   Prothrombin Time 29.5 (*)    INR 2.8 (*)    All other components within normal limits  ETHANOL - Abnormal; Notable for the following components:   Alcohol, Ethyl (B) 309 (*)    All other components within normal limits  ACETAMINOPHEN LEVEL - Abnormal; Notable for the following components:   Acetaminophen (Tylenol), Serum <10 (*)    All other components within normal limits  SALICYLATE LEVEL - Abnormal; Notable for the following components:   Salicylate Lvl <0.9 (*)    All other components within normal limits  LACTIC ACID, PLASMA - Abnormal; Notable for the following components:   Lactic Acid, Venous 2.4 (*)    All other components within normal limits  URINALYSIS, ROUTINE W REFLEX MICROSCOPIC - Abnormal; Notable for the following components:   APPearance HAZY (*)    Specific Gravity, Urine >1.030 (*)     Glucose, UA 100 (*)    Hgb urine dipstick SMALL (*)    Bilirubin Urine LARGE (*)    Protein, ur TRACE (*)    Nitrite POSITIVE (*)    All other components within normal limits  AMMONIA - Abnormal; Notable for the following components:   Ammonia 39 (*)    All other components within normal limits  URINALYSIS, MICROSCOPIC (REFLEX) - Abnormal; Notable for the following components:   Bacteria, UA MANY (*)    Crystals CA OXALATE CRYSTALS (*)    All other components within normal limits  MAGNESIUM  RAPID URINE DRUG  SCREEN, HOSP PERFORMED    No orders to display    Medications  cephALEXin (KEFLEX) capsule 500 mg (has no administration in time range)  sodium chloride 0.9 % bolus 1,000 mL (0 mLs Intravenous Stopped 12/04/21 0140)     Procedures  /  Critical Care Procedures  ED Course and Medical Decision Making  Initial Impression and Ddx Alcohol intoxication with possible rifaximin overdose or toxicity.  Denies self-harm but will need to readdress this when more clinically sober.  Abdomen soft nontender, normal vital signs.  Discussed case with Danielle at poison control, major issue would be AKI regarding overdose of this medicine, awaiting labs.  Past medical/surgical history that increases complexity of ED encounter: Cirrhosis  Interpretation of Diagnostics I personally reviewed the EKG and my interpretation is as follows: Sinus rhythm    Labs with markedly elevated bilirubin but similar to prior.  Overall poor prognosis given patient's cirrhosis and laboratory values.  Patient Reassessment and Ultimate Disposition/Management Upon reassessment patient is resting comfortably with normal vital signs, she is agreeable for discharge.  May have a urinary tract infection, will treat with Keflex.  She is sober and again denies any intention of self-harm.  Patient management required discussion with the following services or consulting groups: Poison control  Complexity of Problems  Addressed Chronic illness with exacerbation  Additional Data Reviewed and Analyzed Further history obtained from: Recent discharge summary  Factors Impacting ED Encounter Risk Prescriptions and Consideration of hospitalization  Barth Kirks. Sedonia Small, [place] [email or web address]  Final Clinical Impressions(s) / ED Diagnoses     ICD-10-CM   1. Alcoholic intoxication with complication ([place])  Z30.076     2. Hepatic cirrhosis, unspecified hepatic cirrhosis type, unspecified whether ascites present ([place])  K74.60     3. Urinary tract infection without hematuria, site unspecified  N39.0       ED Discharge Orders          Ordered    cephALEXin (KEFLEX) 500 MG capsule  3 times daily        12/04/21 0431             Discharge Instructions Discussed with and Provided to Patient:     Discharge Instructions      You were evaluated in the Emergency Department and after careful evaluation, we did not find any emergent condition requiring admission or further testing in the hospital.  Recommend close follow-up with your liver doctors.  Refrain from alcohol.  Take the Keflex antibiotic as directed.  Please return to the Emergency Department if you experience any worsening of your condition.  Thank you for allowing [place] to be a part of your care.        Maudie Flakes, MD 12/04/21 [phone]

## 2021-12-03 NOTE — ED Triage Notes (Signed)
Pt reports that she took an unknown amount of rifaximin 500mg  toady. She had been drinking. She had not drink since October.

## 2021-12-04 LAB — COMPREHENSIVE METABOLIC PANEL
ALT: 27 U/L (ref 0–44)
AST: 69 U/L — ABNORMAL HIGH (ref 15–41)
Albumin: 2.7 g/dL — ABNORMAL LOW (ref 3.5–5.0)
Alkaline Phosphatase: 217 U/L — ABNORMAL HIGH (ref 38–126)
Anion gap: 11 (ref 5–15)
BUN: 8 mg/dL (ref 6–20)
CO2: 17 mmol/L — ABNORMAL LOW (ref 22–32)
Calcium: 7.7 mg/dL — ABNORMAL LOW (ref 8.9–10.3)
Chloride: 103 mmol/L (ref 98–111)
Creatinine, Ser: 0.49 mg/dL (ref 0.44–1.00)
GFR, Estimated: >60 mL/min (ref >60)
Glucose, Bld: 111 mg/dL — ABNORMAL HIGH (ref 70–99)
Potassium: 3.5 mmol/L (ref 3.5–5.1)
Sodium: 131 mmol/L — ABNORMAL LOW (ref 135–145)
Total Bilirubin: 15.5 mg/dL — ABNORMAL HIGH (ref 0.3–1.2)
Total Protein: 7 g/dL (ref 6.5–8.1)

## 2021-12-04 LAB — URINALYSIS, MICROSCOPIC (REFLEX)

## 2021-12-04 LAB — URINALYSIS, ROUTINE W REFLEX MICROSCOPIC
Glucose, UA: 100 mg/dL — AB
Ketones, ur: NEGATIVE mg/dL
Leukocytes,Ua: NEGATIVE
Nitrite: POSITIVE — AB
Specific Gravity, Urine: >1.03 — ABNORMAL HIGH (ref 1.005–1.030)
pH: 5.5 (ref 5.0–8.0)

## 2021-12-04 LAB — MAGNESIUM: Magnesium: 1.9 mg/dL (ref 1.7–2.4)

## 2021-12-04 LAB — SALICYLATE LEVEL: Salicylate Lvl: <7 mg/dL — ABNORMAL LOW (ref 7.0–30.0)

## 2021-12-04 LAB — RAPID URINE DRUG SCREEN, HOSP PERFORMED
Amphetamines: NOT DETECTED
Barbiturates: NOT DETECTED
Benzodiazepines: NOT DETECTED
Cocaine: NOT DETECTED
Opiates: NOT DETECTED
Tetrahydrocannabinol: NOT DETECTED

## 2021-12-04 LAB — CBC
HCT: 33.4 % — ABNORMAL LOW (ref 36.0–46.0)
Hemoglobin: 10.7 g/dL — ABNORMAL LOW (ref 12.0–15.0)
MCH: 31.8 pg (ref 26.0–34.0)
MCHC: 32 g/dL (ref 30.0–36.0)
MCV: 99.1 fL (ref 80.0–100.0)
Platelets: 59 10*3/uL — ABNORMAL LOW (ref 150–400)
RBC: 3.37 MIL/uL — ABNORMAL LOW (ref 3.87–5.11)
RDW: 18.6 % — ABNORMAL HIGH (ref 11.5–15.5)
WBC: 4.9 10*3/uL (ref 4.0–10.5)
nRBC: 0 % (ref 0.0–0.2)

## 2021-12-04 LAB — LIPASE, BLOOD: Lipase: 66 U/L — ABNORMAL HIGH (ref 11–51)

## 2021-12-04 LAB — AMMONIA: Ammonia: 39 umol/L — ABNORMAL HIGH (ref 9–35)

## 2021-12-04 LAB — PROTIME-INR
INR: 2.8 — ABNORMAL HIGH (ref 0.8–1.2)
Prothrombin Time: 29.5 seconds — ABNORMAL HIGH (ref 11.4–15.2)

## 2021-12-04 LAB — ACETAMINOPHEN LEVEL: Acetaminophen (Tylenol), Serum: <10 ug/mL — ABNORMAL LOW (ref 10–30)

## 2021-12-04 LAB — LACTIC ACID, PLASMA: Lactic Acid, Venous: 2.4 mmol/L (ref 0.5–1.9)

## 2021-12-04 LAB — ETHANOL: Alcohol, Ethyl (B): 309 mg/dL (ref <10)

## 2021-12-04 MED ORDER — CEPHALEXIN 500 MG PO CAPS
500.0000 mg | ORAL_CAPSULE | Freq: Once | ORAL | Status: AC
  Administered 2021-12-04: 500 mg via ORAL
  Filled 2021-12-04: qty 1

## 2021-12-04 MED ORDER — SODIUM CHLORIDE 0.9 % IV BOLUS
1000.0000 mL | Freq: Once | INTRAVENOUS | Status: AC
  Administered 2021-12-04: 1000 mL via INTRAVENOUS

## 2021-12-04 MED ORDER — CEPHALEXIN 500 MG PO CAPS: 500.0000 mg | ORAL_CAPSULE | Freq: Three times a day (TID) | ORAL | 0 refills | Status: AC

## 2021-12-04 NOTE — ED Notes (Signed)
Date and time results received: 12/04/21 0017 (use smartphrase ".now" to insert current time)  Test: lactic acid Critical Value: 2.4  Name of Provider Notified: Dr. Sedonia Small  Orders Received? Or Actions Taken?:  n/a

## 2021-12-04 NOTE — ED Notes (Signed)
This RN walked into pt's room to find IV on side table. Pt states she does not know how the IV was displaced.

## 2021-12-04 NOTE — Discharge Instructions (Signed)
You were evaluated in the Emergency Department and after careful evaluation, we did not find any emergent condition requiring admission or further testing in the hospital.  Recommend close follow-up with your liver doctors.  Refrain from alcohol.  Take the Keflex antibiotic as directed.  Please return to the Emergency Department if you experience any worsening of your condition.  Thank you for allowing [place] to be a part of your care.

## 2021-12-04 NOTE — ED Notes (Signed)
Date and time results received: 12/04/21 0030 (use smartphrase ".now" to insert current time)  Test: etoh Critical Value: 309  Name of Provider Notified: Viona Gilmore, MD  Orders Received? Or Actions Taken?:  n/a

## 2021-12-21 ENCOUNTER — Ambulatory Visit (INDEPENDENT_AMBULATORY_CARE_PROVIDER_SITE_OTHER): Payer: Medicaid Other | Admitting: Gastroenterology

## 2021-12-21 ENCOUNTER — Encounter: Payer: Self-pay | Admitting: Gastroenterology

## 2021-12-21 VITALS — BP 98/60 | HR 64 | Ht 61.0 in | Wt 191.0 lb

## 2021-12-21 DIAGNOSIS — K7031 Alcoholic cirrhosis of liver with ascites: Secondary | ICD-10-CM | POA: Diagnosis not present

## 2021-12-21 DIAGNOSIS — I8510 Secondary esophageal varices without bleeding: Secondary | ICD-10-CM

## 2021-12-21 DIAGNOSIS — K769 Liver disease, unspecified: Secondary | ICD-10-CM | POA: Diagnosis not present

## 2021-12-21 DIAGNOSIS — Z8619 Personal history of other infectious and parasitic diseases: Secondary | ICD-10-CM

## 2021-12-21 DIAGNOSIS — J918 Pleural effusion in other conditions classified elsewhere: Secondary | ICD-10-CM

## 2021-12-21 DIAGNOSIS — K7682 Hepatic encephalopathy: Secondary | ICD-10-CM

## 2021-12-21 MED ORDER — FUROSEMIDE 40 MG PO TABS: 40.0000 mg | ORAL_TABLET | Freq: Every day | ORAL | 3 refills | Status: DC

## 2021-12-21 MED ORDER — SPIRONOLACTONE 50 MG PO TABS: 50.0000 mg | ORAL_TABLET | Freq: Every day | ORAL | 3 refills | Status: DC

## 2021-12-21 NOTE — Progress Notes (Signed)
HPI : Victoria Gordon is a pleasant but unfortunate 41 year old female with decompensated alcoholic cirrhosis with multiple admissions in the past year secondary to complications from her cirrhosis.  Her decompensations include ascites with spontaneous bacterial peritonitis, hepatic hydrothorax requiring thoracentesis, hepatic encephalopathy, large varices with unclear history of bleeding.  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  Although she had reported longstanding abstinence at the time of her admissions in October 2022 as well as January of this year, she did admit to alcohol use after she was referred to [facility]'s liver transplant program.  She has never committed to undergoing any sort of formal alcohol rehab program.  Following her first visit with transplant hepatology, she was seen in the emergency department earlier this month with acute alcohol intoxication.  At her follow-up visit with Dr. Zollie Scale a week ago, she was declared not to be a liver transplant candidate and was dismissed from their practice, with recommendations for palliative care or hospice with any further declines in her clinical status. Today, the patient reports doing about the same as she has been in the past several months.  She does note increasing abdominal distention for the past 3 weeks.  Her breathing is okay, and feels that it is most limited by her abdominal distention.  She says that it feels different than when she was having a very symptomatic hepatic hydrothorax for which required thoracentesis.  She reports adherence to her daily Lasix and Aldactone (20 mg and 50 mg, respectively).  She states that she is avoiding salt, but does not appear to be tracking her salt intake closely.  When asked, she does recall that she is supposed to have less than 2 g of salt per day.  She is taking rifaximin twice a day and takes  lactulose "when she needs it".  She denies significant problems with confusion, but does sometimes have trouble remembering things.  She reports having a bowel movement 2-3 times per day.  She has not had any further hemorrhoidal bleeding in quite some time.  She reports very easy bruising.  She denies pruritus. She states that her last drink was the day she went to the emergency department (February 3).  She states that she and her mother are looking into some inpatient alcohol rehab facilities outside of the [place] area.   Past Medical History:  Diagnosis Date   Alcohol abuse    Alcoholic cirrhosis of liver with ascites ([place])    Anemia    Anxiety    Hemorrhoid    Hepatitis B    Major depressive disorder    PTSD (post-traumatic stress disorder)    Substance abuse [facility])      Past Surgical History:  Procedure Laterality Date   BIOPSY  08/29/2021   Procedure: BIOPSY;  Surgeon: Daryel November, MD;  Location: [place];  Service: Gastroenterology;;   CESAREAN SECTION     CHOLECYSTECTOMY     COLONOSCOPY WITH PROPOFOL N/A 08/29/2021   Procedure: COLONOSCOPY WITH PROPOFOL;  Surgeon: Daryel November, MD;  Location: [facility] ENDOSCOPY;  Service: Gastroenterology;  Laterality: N/A;   ESOPHAGOGASTRODUODENOSCOPY (EGD) WITH PROPOFOL N/A 08/29/2021   Procedure: ESOPHAGOGASTRODUODENOSCOPY (EGD) WITH PROPOFOL;  Surgeon: Daryel November, MD;  Location: [place];  Service: Gastroenterology;  Laterality: N/A;   IR THORACENTESIS ASP PLEURAL SPACE W/IMG GUIDE  11/23/2021   MOUTH SURGERY     TUBAL LIGATION Bilateral 07/22/2014  Procedure: POST PARTUM TUBAL LIGATION;  Surgeon: Woodroe Mode, MD;  Location: [place] [facility];  Service: Gynecology;  Laterality: Bilateral;   Family History  Problem Relation Age of Onset   Diabetes Maternal Grandmother    Diabetes Paternal Grandfather    Social History   Tobacco Use   Smoking status: Never   Smokeless tobacco: Never  Vaping Use   Vaping Use:  Never used  Substance Use Topics   Alcohol use: Yes    Comment: 12/03/2021   Drug use: No   Current Outpatient Medications  Medication Sig Dispense Refill   ciprofloxacin (CIPRO) 500 MG tablet Take 1 tablet (500 mg total) by mouth daily. 90 tablet 3   folic acid (FOLVITE) 1 MG tablet Take 1 tablet (1 mg total) by mouth daily. 30 tablet 12   furosemide (LASIX) 20 MG tablet Take 1 tablet (20 mg total) by mouth daily. 30 tablet 0   lactulose (CHRONULAC) 10 GM/15ML solution Take 30 mLs (20 g total) by mouth 2 (two) times daily. 236 mL 0   ondansetron (ZOFRAN-ODT) 4 MG disintegrating tablet Take 1 tablet (4 mg total) by mouth every 8 (eight) hours as needed for nausea or vomiting. 20 tablet 0   pantoprazole (PROTONIX) 40 MG tablet Take 1 tablet (40 mg total) by mouth daily. 30 tablet 3   rifaximin (XIFAXAN) 550 MG TABS tablet Take 1 tablet (550 mg total) by mouth 2 (two) times daily. 42 tablet 3   No current facility-administered medications for this visit.   Allergies  Allergen Reactions   Vitamin K And Related Hives, Shortness Of Breath, Itching and Rash    Tolerated on 08/21/21 without reaction   Vancomycin Hives and Itching     Review of Systems: All systems reviewed and negative except where noted in HPI.    DG Chest 1 View  Result Date: 11/23/2021 CLINICAL DATA:  Status post right thoracentesis. EXAM: CHEST  1 VIEW COMPARISON:  April 07, 2019. FINDINGS: The heart size and mediastinal contours are within normal limits. Left lung is clear. Mild to moderate loculated right pleural effusion is noted with associated atelectasis. No pneumothorax is noted. The visualized skeletal structures are unremarkable. IMPRESSION: No pneumothorax status post right-sided thoracentesis. Stable mild to moderate loculated right pleural effusion. Electronically Signed   By: Marijo Conception M.D.   On: 11/23/2021 14:14   IR THORACENTESIS ASP PLEURAL SPACE W/IMG GUIDE  Result Date: 11/23/2021 INDICATION:  Patient with history of alcohol abuse and alcohol cirrhosis. Found to have a recurrent right-sided pleural effusion. Request is for therapeutic thoracentesis. Using a maximum of 2 L EXAM: ULTRASOUND GUIDED RIGHT-SIDED THERAPEUTIC THORACENTESIS MEDICATIONS: Lidocaine 1% 10 mL COMPLICATIONS: None immediate. PROCEDURE: An ultrasound guided thoracentesis was thoroughly discussed with the patient and questions answered. The benefits, risks, alternatives and complications were also discussed. The patient understands and wishes to proceed with the procedure. Written consent was obtained. Ultrasound was performed to localize and mark an adequate pocket of fluid in the right chest. The area was then prepped and draped in the normal sterile fashion. 1% Lidocaine was used for local anesthesia. Under ultrasound guidance a 6 Fr Safe-T-Centesis catheter was introduced. Thoracentesis was performed. The catheter was removed and a dressing applied. FINDINGS: A total of approximately 2 L of amber colored fluid was removed. IMPRESSION: Successful ultrasound guided therapeutic right-sided thoracentesis yielding 2 L of pleural fluid. Read by: Rushie Nyhan, NP Electronically Signed   By: Corrie Mckusick D.O.   On: 11/23/2021 15:36  Physical Exam: BP 98/60    Pulse 64    Ht 5\' 1"  (1.549 m)    Wt 191 lb (86.6 kg)    LMP  (LMP Unknown)    BMI 36.09 kg/m  Constitutional: Pleasant, ill-appearing, Hispanic female in no acute distress. HEENT: Normocephalic and atraumatic. Conjunctivae are normal.  Deep scleral icterus Neck supple.  Cardiovascular: Normal rate, regular rhythm.  Systolic murmur 3/6 Pulmonary/chest: Effort normal and breath sounds normal. No wheezing, rales or rhonchi.  Decreased breath sounds in right lower lung field Abdominal: Soft, distended, not tense, diffuse mild tenderness to palpation without rigidity or guarding. Bowel sounds active throughout. There are no masses palpable. No hepatomegaly. Extremities:  2+ pitting edema up to mid thigh Neurological: Alert and oriented to person place and time.  No asterixis Skin: Jaundiced skin is warm and dry. No rashes noted.  Ecchymoses on right forearm and bicep (patient states secondary to lab draw in emergency department and her daughter grabbing her arm) Psychiatric: Soft-spoken, poor eye contact, blunted affect. Behavior is normal.  CBC    Component Value Date/Time   WBC 4.9 12/03/2021 2343   RBC 3.37 (L) 12/03/2021 2343   HGB 10.7 (L) 12/03/2021 2343   HCT 33.4 (L) 12/03/2021 2343   PLT 59 (L) 12/03/2021 2343   MCV 99.1 12/03/2021 2343   MCH 31.8 12/03/2021 2343   MCHC 32.0 12/03/2021 2343   RDW 18.6 (H) 12/03/2021 2343   LYMPHSABS 0.9 11/09/2021 0452   MONOABS 0.4 11/09/2021 0452   EOSABS 0.1 11/09/2021 0452   BASOSABS 0.0 11/09/2021 0452    CMP     Component Value Date/Time   NA 131 (L) 12/03/2021 2343   K 3.5 12/03/2021 2343   CL 103 12/03/2021 2343   CO2 17 (L) 12/03/2021 2343   GLUCOSE 111 (H) 12/03/2021 2343   BUN 8 12/03/2021 2343   CREATININE 0.49 12/03/2021 2343   CALCIUM 7.7 (L) 12/03/2021 2343   PROT 7.0 12/03/2021 2343   ALBUMIN 2.7 (L) 12/03/2021 2343   AST 69 (H) 12/03/2021 2343   ALT 27 12/03/2021 2343   ALKPHOS 217 (H) 12/03/2021 2343   BILITOT 15.5 (H) 12/03/2021 2343   GFRNONAA >60 12/03/2021 2343   GFRAA >60 03/05/2020 0844     ASSESSMENT AND PLAN: 41 year old unfortunate female with decompensated alcoholic cirrhosis with numerous admissions for multiple complications from her cirrhosis in the past 6 months.  Her most recent MELD score is 28 (MELD Na 30).  She has been unable to maintain sobriety, and has not been willing to enroll in any rehabilitation programs thus far.  She was referred for liver transplant evaluation, but dismissed due to her ongoing alcohol use.  Her prognosis is very poor and I discussed that with the patient extensively at this visit.  I told her that we can continue to treat and  manage her complications, and that with sustained sobriety, there is a chance she may be able to survive and possibly be reconsidered for liver transplant to improve her longer-term survival.  However, if she chooses to continue to use alcohol, I think palliative care or hospice would be more appropriate.  Alcoholic cirrhosis - Strongly encourage patient to engage with inpatient alcohol rehabilitation - Would recommend palliative care if unable to achieve sustained sobriety - Follow-up 6 weeks  History of SBP -continue daily ciprofloxacin  Ascites, hepatic hydrothorax - Ascites appears to be worsening on current low diuretic dose -Increase lasix to 40 mg daily -Renew aldactone 50  mg daily -Check BMP in 2 weeks - No indication for thoracentesis or therapeutic paracentesis currently - Reinforced importance of low-sodium diet - Continue midodrine 10 mg 3 times daily  Hepatic encephalopathy, no asterixis on exam today - Continue rifaximin 550 mg twice daily - Continue lactulose daily, titrating to 2-3 bowel movements per day  Esophageal varices, no clear history of bleeding - Baseline hypotension not tolerant of beta-blockers - Previously not banded due to INR 3 - Benefits of prophylactic banding in this patient may be outweighed by potential sedation and bleeding risks  Elizjah Noblet E. Candis Schatz, MD [facility]   No ref. provider found

## 2021-12-21 NOTE — Patient Instructions (Addendum)
If you are age 39 or older, your body mass index should be between 23-30. Your Body mass index is 36.09 kg/m. If this is out of the aforementioned range listed, please consider follow up with your Primary Care Provider.  If you are age 66 or younger, your body mass index should be between 19-25. Your Body mass index is 36.09 kg/m. If this is out of the aformentioned range listed, please consider follow up with your Primary Care Provider.   We have sent the following medications to your pharmacy for you to pick up at your convenience:Lasix 40 mg daily , Aldactone 50 mg daily.  Your provider has requested that you go to the basement level for lab work on 01/18/22. Press "B" on the elevator. The lab is located at the first door on the left as you exit the elevator.    The [facility] providers would like to encourage you to use [facility] to communicate with providers for non-urgent requests or questions.  Due to long hold times on the telephone, sending your provider a message by [facility] may be a faster and more efficient way to get a response.  Please allow 48 business hours for a response.  Please remember that this is for non-urgent requests.   It was a pleasure to see you today!  Thank you for trusting me with your gastrointestinal care!    Scott E.Candis Schatz, MD

## 2021-12-22 ENCOUNTER — Encounter: Payer: Self-pay | Admitting: Gastroenterology

## 2022-01-11 ENCOUNTER — Other Ambulatory Visit: Payer: Self-pay | Admitting: Gastroenterology

## 2022-01-17 ENCOUNTER — Emergency Department (HOSPITAL_COMMUNITY): Payer: Medicaid Other

## 2022-01-17 ENCOUNTER — Inpatient Hospital Stay (HOSPITAL_COMMUNITY)
Admission: EM | Admit: 2022-01-17 | Discharge: 2022-01-20 | DRG: 433 | Disposition: A | Discharge status: Hospice | Payer: Medicaid Other | Attending: Internal Medicine | Admitting: Internal Medicine

## 2022-01-17 ENCOUNTER — Other Ambulatory Visit: Payer: Self-pay

## 2022-01-17 ENCOUNTER — Encounter (HOSPITAL_COMMUNITY): Payer: Self-pay | Admitting: Emergency Medicine

## 2022-01-17 DIAGNOSIS — E8809 Other disorders of plasma-protein metabolism, not elsewhere classified: Secondary | ICD-10-CM | POA: Diagnosis present

## 2022-01-17 DIAGNOSIS — R040 Epistaxis: Secondary | ICD-10-CM | POA: Diagnosis present

## 2022-01-17 DIAGNOSIS — R0602 Shortness of breath: Principal | ICD-10-CM

## 2022-01-17 DIAGNOSIS — Z9114 Patient's other noncompliance with medication regimen: Secondary | ICD-10-CM

## 2022-01-17 DIAGNOSIS — E871 Hypo-osmolality and hyponatremia: Secondary | ICD-10-CM | POA: Diagnosis present

## 2022-01-17 DIAGNOSIS — J948 Other specified pleural conditions: Secondary | ICD-10-CM

## 2022-01-17 DIAGNOSIS — I8510 Secondary esophageal varices without bleeding: Secondary | ICD-10-CM | POA: Diagnosis present

## 2022-01-17 DIAGNOSIS — D61818 Other pancytopenia: Secondary | ICD-10-CM | POA: Diagnosis present

## 2022-01-17 DIAGNOSIS — J918 Pleural effusion in other conditions classified elsewhere: Secondary | ICD-10-CM | POA: Diagnosis present

## 2022-01-17 DIAGNOSIS — K766 Portal hypertension: Secondary | ICD-10-CM | POA: Diagnosis present

## 2022-01-17 DIAGNOSIS — F4310 Post-traumatic stress disorder, unspecified: Secondary | ICD-10-CM | POA: Diagnosis present

## 2022-01-17 DIAGNOSIS — E876 Hypokalemia: Secondary | ICD-10-CM | POA: Diagnosis present

## 2022-01-17 DIAGNOSIS — K3189 Other diseases of stomach and duodenum: Secondary | ICD-10-CM | POA: Diagnosis present

## 2022-01-17 DIAGNOSIS — F1020 Alcohol dependence, uncomplicated: Secondary | ICD-10-CM | POA: Diagnosis present

## 2022-01-17 DIAGNOSIS — Z20822 Contact with and (suspected) exposure to covid-19: Secondary | ICD-10-CM | POA: Diagnosis present

## 2022-01-17 DIAGNOSIS — Z833 Family history of diabetes mellitus: Secondary | ICD-10-CM

## 2022-01-17 DIAGNOSIS — Z515 Encounter for palliative care: Secondary | ICD-10-CM

## 2022-01-17 DIAGNOSIS — J90 Pleural effusion, not elsewhere classified: Secondary | ICD-10-CM | POA: Diagnosis not present

## 2022-01-17 DIAGNOSIS — D689 Coagulation defect, unspecified: Secondary | ICD-10-CM | POA: Diagnosis present

## 2022-01-17 DIAGNOSIS — I8500 Esophageal varices without bleeding: Secondary | ICD-10-CM | POA: Diagnosis present

## 2022-01-17 DIAGNOSIS — Z79899 Other long term (current) drug therapy: Secondary | ICD-10-CM

## 2022-01-17 DIAGNOSIS — K7031 Alcoholic cirrhosis of liver with ascites: Principal | ICD-10-CM | POA: Diagnosis present

## 2022-01-17 DIAGNOSIS — Z9049 Acquired absence of other specified parts of digestive tract: Secondary | ICD-10-CM

## 2022-01-17 DIAGNOSIS — K7030 Alcoholic cirrhosis of liver without ascites: Secondary | ICD-10-CM | POA: Diagnosis present

## 2022-01-17 LAB — COMPREHENSIVE METABOLIC PANEL
ALT: 25 U/L (ref 0–44)
AST: 60 U/L — ABNORMAL HIGH (ref 15–41)
Albumin: 2.1 g/dL — ABNORMAL LOW (ref 3.5–5.0)
Alkaline Phosphatase: 183 U/L — ABNORMAL HIGH (ref 38–126)
Anion gap: 7 (ref 5–15)
BUN: 5 mg/dL — ABNORMAL LOW (ref 6–20)
CO2: 19 mmol/L — ABNORMAL LOW (ref 22–32)
Calcium: 7.7 mg/dL — ABNORMAL LOW (ref 8.9–10.3)
Chloride: 102 mmol/L (ref 98–111)
Creatinine, Ser: 0.56 mg/dL (ref 0.44–1.00)
GFR, Estimated: >60 mL/min (ref >60)
Glucose, Bld: 89 mg/dL (ref 70–99)
Potassium: 3.4 mmol/L — ABNORMAL LOW (ref 3.5–5.1)
Sodium: 128 mmol/L — ABNORMAL LOW (ref 135–145)
Total Bilirubin: 14 mg/dL — ABNORMAL HIGH (ref 0.3–1.2)
Total Protein: 6 g/dL — ABNORMAL LOW (ref 6.5–8.1)

## 2022-01-17 LAB — CBC WITH DIFFERENTIAL/PLATELET
Abs Immature Granulocytes: 0.02 10*3/uL (ref 0.00–0.07)
Basophils Absolute: 0 10*3/uL (ref 0.0–0.1)
Basophils Relative: 1 %
Eosinophils Absolute: 0.2 10*3/uL (ref 0.0–0.5)
Eosinophils Relative: 5 %
HCT: 26.1 % — ABNORMAL LOW (ref 36.0–46.0)
Hemoglobin: 8.7 g/dL — ABNORMAL LOW (ref 12.0–15.0)
Immature Granulocytes: 1 %
Lymphocytes Relative: 15 %
Lymphs Abs: 0.5 10*3/uL — ABNORMAL LOW (ref 0.7–4.0)
MCH: 32.7 pg (ref 26.0–34.0)
MCHC: 33.3 g/dL (ref 30.0–36.0)
MCV: 98.1 fL (ref 80.0–100.0)
Monocytes Absolute: 0.3 10*3/uL (ref 0.1–1.0)
Monocytes Relative: 9 %
Neutro Abs: 2.4 10*3/uL (ref 1.7–7.7)
Neutrophils Relative %: 69 %
Platelets: 36 10*3/uL — ABNORMAL LOW (ref 150–400)
RBC: 2.66 MIL/uL — ABNORMAL LOW (ref 3.87–5.11)
RDW: 17.7 % — ABNORMAL HIGH (ref 11.5–15.5)
WBC: 3.5 10*3/uL — ABNORMAL LOW (ref 4.0–10.5)
nRBC: 0 % (ref 0.0–0.2)

## 2022-01-17 LAB — RESP PANEL BY RT-PCR (FLU A&B, COVID) ARPGX2
Influenza A by PCR: NEGATIVE
Influenza B by PCR: NEGATIVE
SARS Coronavirus 2 by RT PCR: NEGATIVE

## 2022-01-17 LAB — PROTIME-INR
INR: 3.5 — ABNORMAL HIGH (ref 0.8–1.2)
Prothrombin Time: 34.7 seconds — ABNORMAL HIGH (ref 11.4–15.2)

## 2022-01-17 LAB — LIPASE, BLOOD: Lipase: 58 U/L — ABNORMAL HIGH (ref 11–51)

## 2022-01-17 MED ORDER — FUROSEMIDE 40 MG PO TABS
40.0000 mg | ORAL_TABLET | Freq: Every day | ORAL | Status: DC
  Administered 2022-01-18 – 2022-01-20 (×3): 40 mg via ORAL
  Filled 2022-01-17 (×3): qty 1

## 2022-01-17 MED ORDER — LACTULOSE 10 GM/15ML PO SOLN
20.0000 g | Freq: Every day | ORAL | Status: DC
  Administered 2022-01-18 – 2022-01-20 (×3): 20 g via ORAL
  Filled 2022-01-17 (×3): qty 30

## 2022-01-17 MED ORDER — CIPROFLOXACIN HCL 500 MG PO TABS
500.0000 mg | ORAL_TABLET | Freq: Every day | ORAL | Status: DC
Start: 2022-01-17 — End: 2022-01-20
  Administered 2022-01-17 – 2022-01-20 (×4): 500 mg via ORAL
  Filled 2022-01-17 (×4): qty 1

## 2022-01-17 MED ORDER — RIFAXIMIN 550 MG PO TABS
550.0000 mg | ORAL_TABLET | Freq: Two times a day (BID) | ORAL | Status: DC
Start: 2022-01-17 — End: 2022-01-20
  Administered 2022-01-17 – 2022-01-20 (×6): 550 mg via ORAL
  Filled 2022-01-17 (×6): qty 1

## 2022-01-17 MED ORDER — PANTOPRAZOLE SODIUM 40 MG PO TBEC
40.0000 mg | DELAYED_RELEASE_TABLET | Freq: Every day | ORAL | Status: DC
  Administered 2022-01-18 – 2022-01-20 (×3): 40 mg via ORAL
  Filled 2022-01-17 (×3): qty 1

## 2022-01-17 MED ORDER — MIDODRINE HCL 5 MG PO TABS
10.0000 mg | ORAL_TABLET | Freq: Three times a day (TID) | ORAL | Status: DC
  Administered 2022-01-18 – 2022-01-20 (×8): 10 mg via ORAL
  Filled 2022-01-17 (×7): qty 2

## 2022-01-17 MED ORDER — FOLIC ACID 1 MG PO TABS
1.0000 mg | ORAL_TABLET | Freq: Every day | ORAL | Status: DC
  Administered 2022-01-18 – 2022-01-20 (×3): 1 mg via ORAL
  Filled 2022-01-17 (×3): qty 1

## 2022-01-17 MED ORDER — SPIRONOLACTONE 25 MG PO TABS
50.0000 mg | ORAL_TABLET | Freq: Every day | ORAL | Status: DC
Start: 2022-01-18 — End: 2022-01-20
  Administered 2022-01-18 – 2022-01-20 (×3): 50 mg via ORAL
  Filled 2022-01-17 (×3): qty 2

## 2022-01-17 MED ORDER — ONDANSETRON HCL 4 MG PO TABS: 4.0000 mg | ORAL_TABLET | Freq: Four times a day (QID) | ORAL | Status: DC | PRN

## 2022-01-17 MED ORDER — ONDANSETRON HCL 4 MG/2ML IJ SOLN
4.0000 mg | Freq: Four times a day (QID) | INTRAMUSCULAR | Status: DC | PRN
  Administered 2022-01-18: 4 mg via INTRAVENOUS
  Filled 2022-01-17: qty 2

## 2022-01-17 MED ORDER — LACTULOSE 10 GM/15ML PO SOLN
20.0000 g | Freq: Two times a day (BID) | ORAL | Status: DC
  Filled 2022-01-17: qty 30

## 2022-01-17 MED ORDER — POTASSIUM CHLORIDE CRYS ER 20 MEQ PO TBCR
20.0000 meq | EXTENDED_RELEASE_TABLET | Freq: Once | ORAL | Status: AC
  Administered 2022-01-17: 20 meq via ORAL
  Filled 2022-01-17: qty 1
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
## 2022-01-17 NOTE — Assessment & Plan Note (Addendum)
Chronic from cirrhosis ?

## 2022-01-17 NOTE — Assessment & Plan Note (Signed)
Chronic and stable, secondary to cirrhosis. ?

## 2022-01-17 NOTE — ED Notes (Signed)
Patient ambulated to restroom independently with no assistance. 

## 2022-01-17 NOTE — Assessment & Plan Note (Signed)
Chronic, secondary to cirrhosis.  Continue to monitor. ?

## 2022-01-17 NOTE — Assessment & Plan Note (Addendum)
Status post thoracentesis 3/21 showing transudative fluid.  About 2 L removed.  Stable, no longer short of breath.  Follow-up outpatient ?

## 2022-01-17 NOTE — ED Triage Notes (Signed)
Patient with history of ascites here with complaint of abdominal swelling and shortness of breath that started three days ago, last thoracentesis two months ago. Patient is alert, oriented, ambulatory, speaking in complete sentences. ?

## 2022-01-17 NOTE — Assessment & Plan Note (Addendum)
Esophageal varices without bleeding ?Portal hypertensive gastropathy ?History of SBP ?History of hepatic encephalopathy ? ?Has history of decompensated liver cirrhosis.  Follows outpatient GI Dr. Candis Schatz.  Has not been taking most of her medications at home.  Home medications have been restarted including-Lasix, Aldactone, rifaximin, lactulose, midodrine.  Refill prescriptions have been given for these medications.  Also seen by palliative care service, outpatient hospice will be arranged. ?Continue daily Cipro for SBP prophylaxis as previously prescribed by GI ?

## 2022-01-17 NOTE — Hospital Course (Signed)
Victoria Gordon is a 41 y.o. female with medical history significant for Alcohol associated hepatic cirrhosis with large esophageal varices, portal hypertensive gastropathy, recurrent right pleural effusion, pancytopenia, chronic hyponatremia, PTSD, anxiety, substance use disorder who is admitted with dyspnea due to recurrent large right-sided pleural effusion. ?

## 2022-01-17 NOTE — ED Notes (Signed)
Patient transported to X-ray 

## 2022-01-17 NOTE — H&P (Addendum)
?History and Physical  ? ? ?Victoria Gordon VOH:[id number] DOB: [birth year] DOA: 01/17/2022 ? ?PCP: Patient, [name]  ?Patient coming from: Home ? ?I have personally briefly reviewed patient's old medical records in [place] ? ?Chief Complaint: Shortness of breath ? ?HPI: ?Victoria Gordon is a 41 y.o. female with medical history significant for Alcohol associated hepatic cirrhosis with large esophageal varices, portal hypertensive gastropathy, recurrent right pleural effusion, pancytopenia, chronic hyponatremia, PTSD, anxiety, substance use disorder who presented to the ED for evaluation of dyspnea. ? ?Patient reports several days of dyspnea with minimal exertion.  She has had associated nonproductive cough.  She feels like her abdomen is swelling a little more than her baseline.  She said decreased urine output than expected.  She denies any nausea, vomiting, abdominal pain.  She says that she is taking lactulose and is having about 3 bowel movements per day.  She states that she has also run out of several of her prior prescription medications, presumably her spironolactone and Lasix, several weeks ago. ? ?She states that she has not had any alcohol for 2 months.  She denies any tobacco or illicit drug use. ? ?She has recently been evaluated by [facility] liver transplant team and is not considered a transplant candidate. ? ?ED Course  Labs/Imaging on admission: I have personally reviewed following labs and imaging studies. ? ?Initial vitals showed BP 120/64, pulse 75, RR 12, temp 98.2 ?F, SPO2 100% on room air. ? ?Labs show sodium 128, potassium 3.4, bicarb 19, BUN 5, creatinine 0.56, serum glucose 89, AST 60, ALT 25, alk phos 183, total bilirubin 14.0, albumin 2.1, calcium 7.7 (corrects to ~9.3 when accounting for hypoalbuminemia), lipase 58, INR 3.5. ? ?2 view chest x-ray shows progressive large right pleural effusion. ? ?The hospitalist service was consulted to admit for further  evaluation and management. ? ?Review of Systems: All systems reviewed and are negative except as documented in history of present illness above. ? ? ?Past Medical History:  ?Diagnosis Date  ? Alcohol abuse   ? Alcoholic cirrhosis of liver with ascites ([place])   ? Anemia   ? Anxiety   ? Hemorrhoid   ? Hepatitis B   ? Major depressive disorder   ? PTSD (post-traumatic stress disorder)   ? Substance abuse ([place])   ? ? ?Past Surgical History:  ?Procedure Laterality Date  ? BIOPSY  08/29/2021  ? Procedure: BIOPSY;  Surgeon: Daryel November, MD;  Location: [place];  Service: Gastroenterology;;  ? CESAREAN SECTION    ? CHOLECYSTECTOMY    ? COLONOSCOPY WITH PROPOFOL N/A 08/29/2021  ? Procedure: COLONOSCOPY WITH PROPOFOL;  Surgeon: Daryel November, MD;  Location: [place];  Service: Gastroenterology;  Laterality: N/A;  ? ESOPHAGOGASTRODUODENOSCOPY (EGD) WITH PROPOFOL N/A 08/29/2021  ? Procedure: ESOPHAGOGASTRODUODENOSCOPY (EGD) WITH PROPOFOL;  Surgeon: Daryel November, MD;  Location: [place];  Service: Gastroenterology;  Laterality: N/A;  ? IR THORACENTESIS ASP PLEURAL SPACE W/IMG GUIDE  11/23/2021  ? MOUTH SURGERY    ? TUBAL LIGATION Bilateral 07/22/2014  ? Procedure: POST PARTUM TUBAL LIGATION;  Surgeon: Woodroe Mode, MD;  Location: [facility];  Service: Gynecology;  Laterality: Bilateral;  ? ? ?Social History: ? reports that she has never smoked. She has never used smokeless tobacco. She reports current alcohol use. She reports that she does not use drugs. ? ?Allergies  ?Allergen Reactions  ? Vitamin K And Related Hives, Shortness Of Breath, Itching and Rash  ?  Tolerated on 08/21/21 without reaction  ? Vancomycin Hives and Itching  ? ? ?Family History  ?Problem Relation Age of Onset  ? Diabetes Maternal Grandmother   ? Diabetes Paternal Grandfather   ? ? ? ?Prior to Admission medications   ?Medication Sig Start Date End Date Taking? Authorizing Provider  ?ciprofloxacin (CIPRO) 500 MG tablet Take 1  tablet (500 mg total) by mouth daily. 09/27/21   Daryel November, MD  ?folic acid (FOLVITE) 1 MG tablet Take 1 tablet (1 mg total) by mouth daily. 08/30/21   Nita Sells, MD  ?furosemide (LASIX) 40 MG tablet Take 1 tablet (40 mg total) by mouth daily. 12/21/21   Daryel November, MD  ?lactulose (CHRONULAC) 10 GM/15ML solution Take 30 mLs (20 g total) by mouth 2 (two) times daily. 09/27/21   Daryel November, MD  ?ondansetron (ZOFRAN-ODT) 4 MG disintegrating tablet Take 1 tablet (4 mg total) by mouth every 8 (eight) hours as needed for nausea or vomiting. 11/11/21   Darliss Cheney, MD  ?pantoprazole (PROTONIX) 40 MG tablet Take 1 tablet (40 mg total) by mouth daily. 08/30/21   Nita Sells, MD  ?rifaximin (XIFAXAN) 550 MG TABS tablet Take 1 tablet (550 mg total) by mouth 2 (two) times daily. 09/27/21   Daryel November, MD  ?spironolactone (ALDACTONE) 50 MG tablet Take 1 tablet (50 mg total) by mouth daily. 12/21/21   Daryel November, MD  ? ? ?Physical Exam: ?Vitals:  ? 01/17/22 1645 01/17/22 1745 01/17/22 1930 01/17/22 2015  ?BP: 118/61 118/63 115/68 108/65  ?Pulse: 78 81 85 77  ?Resp: '20 19 20 ' (!) 24  ?Temp:      ?TempSrc:      ?SpO2: 100% 99% 100% 99%  ?Weight:      ?Height:      ? ?Constitutional: Resting in bed, NAD, calm, comfortable ?Eyes: PERRL, lids and conjunctivae normal ?ENMT: Mucous membranes are moist. Posterior pharynx clear of any exudate or lesions.Normal dentition.  ?Neck: normal, supple, no masses. ?Respiratory: Diminished breath sounds halfway up the right lung field, left lung field clear to auscultation. Normal respiratory effort. No accessory muscle use.  ?Cardiovascular: Regular rate and rhythm, systolic murmur present. No extremity edema. 2+ pedal pulses. ?Abdomen: Mild RUQ tenderness, no masses palpated. ?Musculoskeletal: no clubbing / cyanosis. No joint deformity upper and lower extremities. Good ROM, no contractures. Normal muscle tone.  ?Skin: no rashes,  lesions, ulcers. No induration ?Neurologic: CN 2-12 grossly intact. Sensation intact. Strength 5/5 in all 4.  ?Psychiatric: Alert and oriented x 3. Normal mood.  ? ?EKG: Personally reviewed. Normal sinus rhythm, low voltage, motion artifact.  Similar to prior. ? ?Assessment/Plan ?Principal Problem: ?  Recurrent right pleural effusion ?Active Problems: ?  Alcoholic cirrhosis of liver ([place]) ?  Alcohol use disorder, severe, dependence ([place]) ?  Pancytopenia ([place]) ?  Hyperbilirubinemia ?  Esophageal varices without bleeding ([place]) ?  Chronic hyponatremia ?  ?Victoria Gordon is a 41 y.o. female with medical history significant for Alcohol associated hepatic cirrhosis with large esophageal varices, portal hypertensive gastropathy, recurrent right pleural effusion, pancytopenia, chronic hyponatremia, PTSD, anxiety, substance use disorder who is admitted with dyspnea due to recurrent large right-sided pleural effusion. ? ?Assessment and Plan: ?* Recurrent right pleural effusion ?Patient presenting with exertional dyspnea due to recurrent right-sided pleural effusion/hepatic hydrothorax. ?-Ultrasound thoracentesis ordered for a.m. ? ?Alcoholic cirrhosis of liver ([place]) ?Esophageal varices without bleeding ?Portal hypertensive gastropathy ?History of SBP ?History of hepatic encephalopathy ?Patient with known alcohol  associated liver cirrhosis.  Follows with GI, Dr. Candis Schatz.  Patient states she has run out of several of her medications few weeks ago.  Does not appear to have significant ascites on exam.  She is maintaining well without evidence of hepatic encephalopathy or signs/symptoms of SBP.  MELD 3.0 score is 35 on admission which correlates to 46.1% 90-day survival estimate.  She is determined not to be a transplant candidate by [facility] transplant team. ?-Restart Lasix 40 mg daily and spironolactone 50 mg daily ?-Continue rifaximin 550 mg twice daily ?-Continue lactulose daily, titrate to 2-3 bowel movements per  day ?-Continue ciprofloxacin for SBP prophylaxis ?-Continue monitoring 10 mg 3 times daily ? ? ?Alcohol use disorder, severe, dependence ([place]) ?Patient reports no alcohol use for about 2 months.  Monitor CIWA every 4 hours

## 2022-01-17 NOTE — Assessment & Plan Note (Addendum)
Reports no alcohol use in 2 months.  Currently on withdrawal protocol. ? ?

## 2022-01-17 NOTE — ED Provider Triage Note (Signed)
Emergency Medicine Provider Triage Evaluation Note ? ?Victoria Gordon , a 41 y.o. female  was evaluated in triage.  Pt complains of abdominal distention, shortness of breath.  She has a history of ascites with a last paracentesis being about 2 months ago.  She has not drink alcohol in over a month.  She reports abdominal discomfort and pressure sensation as well as shortness of breath and leg swelling.  She denies any fever, vomiting. ? ?Review of Systems  ?Positive: Shortness of breath, abdominal distention ?Negative: Fever, vomiting ? ?Physical Exam  ?BP 123/61   Pulse 88   Temp 98.4 ?F (36.9 ?C) (Oral)   Resp 18   LMP  (LMP Unknown)   SpO2 100%  ?Gen:   Awake, no distress   ?Resp:  Normal effort  ?MSK:   Moves extremities without difficulty  ?Other:  Pitting edema to bilateral lower extremities, abdominal distention with generalized tenderness, bilateral scleral icterus ? ?Medical Decision Making  ?Medically screening exam initiated at 10:01 AM.  Appropriate orders placed.  Victoria Gordon was informed that the remainder of the evaluation will be completed by another provider, this initial triage assessment does not replace that evaluation, and the importance of remaining in the ED until their evaluation is complete. ? ?We will obtain labs ?  ?Delia Heady, PA-C ?01/17/22 1003 ? ?

## 2022-01-18 ENCOUNTER — Inpatient Hospital Stay (HOSPITAL_COMMUNITY): Payer: Medicaid Other

## 2022-01-18 ENCOUNTER — Observation Stay (HOSPITAL_COMMUNITY): Payer: Medicaid Other

## 2022-01-18 DIAGNOSIS — Z79899 Other long term (current) drug therapy: Secondary | ICD-10-CM | POA: Diagnosis not present

## 2022-01-18 DIAGNOSIS — Z9049 Acquired absence of other specified parts of digestive tract: Secondary | ICD-10-CM | POA: Diagnosis not present

## 2022-01-18 DIAGNOSIS — I8510 Secondary esophageal varices without bleeding: Secondary | ICD-10-CM | POA: Diagnosis present

## 2022-01-18 DIAGNOSIS — F1020 Alcohol dependence, uncomplicated: Secondary | ICD-10-CM | POA: Diagnosis present

## 2022-01-18 DIAGNOSIS — Z515 Encounter for palliative care: Secondary | ICD-10-CM | POA: Diagnosis not present

## 2022-01-18 DIAGNOSIS — E8809 Other disorders of plasma-protein metabolism, not elsewhere classified: Secondary | ICD-10-CM | POA: Diagnosis present

## 2022-01-18 DIAGNOSIS — Z7189 Other specified counseling: Secondary | ICD-10-CM | POA: Diagnosis not present

## 2022-01-18 DIAGNOSIS — R040 Epistaxis: Secondary | ICD-10-CM | POA: Diagnosis present

## 2022-01-18 DIAGNOSIS — D61818 Other pancytopenia: Secondary | ICD-10-CM | POA: Diagnosis present

## 2022-01-18 DIAGNOSIS — Z20822 Contact with and (suspected) exposure to covid-19: Secondary | ICD-10-CM | POA: Diagnosis present

## 2022-01-18 DIAGNOSIS — J918 Pleural effusion in other conditions classified elsewhere: Secondary | ICD-10-CM | POA: Diagnosis present

## 2022-01-18 DIAGNOSIS — R0602 Shortness of breath: Secondary | ICD-10-CM | POA: Diagnosis present

## 2022-01-18 DIAGNOSIS — K7031 Alcoholic cirrhosis of liver with ascites: Secondary | ICD-10-CM | POA: Diagnosis present

## 2022-01-18 DIAGNOSIS — Z9114 Patient's other noncompliance with medication regimen: Secondary | ICD-10-CM | POA: Diagnosis not present

## 2022-01-18 DIAGNOSIS — J90 Pleural effusion, not elsewhere classified: Secondary | ICD-10-CM | POA: Diagnosis not present

## 2022-01-18 DIAGNOSIS — Z833 Family history of diabetes mellitus: Secondary | ICD-10-CM | POA: Diagnosis not present

## 2022-01-18 DIAGNOSIS — D689 Coagulation defect, unspecified: Secondary | ICD-10-CM | POA: Diagnosis present

## 2022-01-18 DIAGNOSIS — F4310 Post-traumatic stress disorder, unspecified: Secondary | ICD-10-CM | POA: Diagnosis present

## 2022-01-18 DIAGNOSIS — K766 Portal hypertension: Secondary | ICD-10-CM | POA: Diagnosis present

## 2022-01-18 DIAGNOSIS — K3189 Other diseases of stomach and duodenum: Secondary | ICD-10-CM | POA: Diagnosis present

## 2022-01-18 DIAGNOSIS — E871 Hypo-osmolality and hyponatremia: Secondary | ICD-10-CM | POA: Diagnosis present

## 2022-01-18 LAB — BODY FLUID CELL COUNT WITH DIFFERENTIAL
Eos, Fluid: 0 %
Lymphs, Fluid: 63 %
Monocyte-Macrophage-Serous Fluid: 30 % — ABNORMAL LOW (ref 50–90)
Neutrophil Count, Fluid: 7 % (ref 0–25)
Total Nucleated Cell Count, Fluid: 31 cu mm (ref 0–1000)

## 2022-01-18 LAB — COMPREHENSIVE METABOLIC PANEL
ALT: 22 U/L (ref 0–44)
AST: 49 U/L — ABNORMAL HIGH (ref 15–41)
Albumin: 1.7 g/dL — ABNORMAL LOW (ref 3.5–5.0)
Alkaline Phosphatase: 129 U/L — ABNORMAL HIGH (ref 38–126)
Anion gap: 6 (ref 5–15)
BUN: 6 mg/dL (ref 6–20)
CO2: 17 mmol/L — ABNORMAL LOW (ref 22–32)
Calcium: 7.2 mg/dL — ABNORMAL LOW (ref 8.9–10.3)
Chloride: 102 mmol/L (ref 98–111)
Creatinine, Ser: 0.48 mg/dL (ref 0.44–1.00)
GFR, Estimated: >60 mL/min (ref >60)
Glucose, Bld: 80 mg/dL (ref 70–99)
Potassium: 3.8 mmol/L (ref 3.5–5.1)
Sodium: 125 mmol/L — ABNORMAL LOW (ref 135–145)
Total Bilirubin: 12.2 mg/dL — ABNORMAL HIGH (ref 0.3–1.2)
Total Protein: 4.9 g/dL — ABNORMAL LOW (ref 6.5–8.1)

## 2022-01-18 LAB — CBC
HCT: 21.2 % — ABNORMAL LOW (ref 36.0–46.0)
Hemoglobin: 7.3 g/dL — ABNORMAL LOW (ref 12.0–15.0)
MCH: 32.6 pg (ref 26.0–34.0)
MCHC: 34.4 g/dL (ref 30.0–36.0)
MCV: 94.6 fL (ref 80.0–100.0)
Platelets: 27 10*3/uL — CL (ref 150–400)
RBC: 2.24 MIL/uL — ABNORMAL LOW (ref 3.87–5.11)
RDW: 17.8 % — ABNORMAL HIGH (ref 11.5–15.5)
WBC: 2.3 10*3/uL — ABNORMAL LOW (ref 4.0–10.5)
nRBC: 0 % (ref 0.0–0.2)

## 2022-01-18 LAB — PROTIME-INR
INR: 3.9 — ABNORMAL HIGH (ref 0.8–1.2)
Prothrombin Time: 38.3 seconds — ABNORMAL HIGH (ref 11.4–15.2)

## 2022-01-18 LAB — AMMONIA: Ammonia: 48 umol/L — ABNORMAL HIGH (ref 9–35)

## 2022-01-18 MED ORDER — ALBUMIN HUMAN 25 % IV SOLN
50.0000 g | Freq: Once | INTRAVENOUS | Status: AC
  Administered 2022-01-18: 50 g via INTRAVENOUS
  Filled 2022-01-18: qty 200

## 2022-01-18 MED ORDER — METOPROLOL TARTRATE 5 MG/5ML IV SOLN
5.0000 mg | INTRAVENOUS | Status: DC | PRN
Start: 2022-01-18 — End: 2022-01-20

## 2022-01-18 MED ORDER — SENNOSIDES-DOCUSATE SODIUM 8.6-50 MG PO TABS
1.0000 | ORAL_TABLET | Freq: Every evening | ORAL | Status: DC | PRN
Start: 2022-01-18 — End: 2022-01-20

## 2022-01-18 MED ORDER — HYDRALAZINE HCL 20 MG/ML IJ SOLN: 10.0000 mg | INTRAMUSCULAR | Status: DC | PRN

## 2022-01-18 MED ORDER — IPRATROPIUM-ALBUTEROL 0.5-2.5 (3) MG/3ML IN SOLN: 3.0000 mL | RESPIRATORY_TRACT | Status: DC | PRN

## 2022-01-18 MED ORDER — GUAIFENESIN 100 MG/5ML PO LIQD
5.0000 mL | ORAL | Status: DC | PRN
  Filled 2022-01-18: qty 5

## 2022-01-18 MED ORDER — LIDOCAINE HCL (PF) 1 % IJ SOLN
INTRAMUSCULAR | Status: AC
  Filled 2022-01-18: qty 30

## 2022-01-18 NOTE — Consult Note (Addendum)
? ?                                                                          [facility] Consult: ?11:53 AM ?01/18/2022 ? LOS: 0 days  ? ? ?Referring Provider: Dr Reesa Chew  ?Primary Care Physician:  Patient, [name].  Has not established care with any PCP ?Primary Gastroenterologist:  Dr. Candis Schatz.    ? ? ? ?Reason for Consultation: Decompensated cirrhosis. ?  ?HPI: Victoria Gordon is a 41 y.o. female.  Hx ETOH use disorder, substance abuse.  Cirrhosis.  Hepatitis B. Ascites, SBP.  Thrombocytopenia, critical.  Coagulopathy.  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  Has seen several hepatology/GI providers at the [facility], [facility], [place], [place].  Deemed not a candidate for transplant due to relapsing ETOH use by Dr. Coralyn Pear at [place] (recommended establishing with palliative care.  Hospice if clinically worsening).  Dr Earnest Conroy did not recommend treatment with corticosteroid ? ?Notable hospitalizations for decompensated cirrhosis including SBP, hepatic hydrothorax requiring thoracentesis, HE.   ?07/2021 admission with Hgb 2, IDA, FOBT positive treated with PRBCs. ?08/29/2021 EGD.  Large esophageal varices with no stigmata of bleeding.  Varices not banded due to low platelets and coagulopathy.  Friable portal hypertensive gastropathy, biopsied.  Nonbleeding erosive gastropathy was suspected source of bleeding. ?08/29/2021 colonoscopy.  Fair prep though cecum only partially visualized due to presence of solid stool that was not suctionable/lavageable.  Nonbleeding, internal hemorrhoids.  Friability, contact bleeding throughout colon attributed to coagulopathy, thrombocytopenia. ?07/15/2021 5.3 L paracentesis.   ?Order for paracentesis in early January 2023 with ultrasound of 11/02/2021 revealing trace ascites, insufficient for paracentesis. ? ?Previous meds include Aldactone  50 mg daily, furosemide 40 mg daily, Chronulac, rifaximin, midodrine, Cipro. No BBs.   ?Admitted to consumption of alcohol as recently as early February 2023 (intoxicated at [facility] admission then). ?MELD 28, MELD Na 30 as of 12/21/21.  AFP 3.2 in 07/2021. ? ?Admitted after presenting to the ED yesterday morning.  C/O abdominal swelling, dyspnea.  On Friday, Saturday, Sunday she had significant epistaxis.  Appetite variable but no nausea, vomiting, abdominal pain, fever.  Spontaneous bruising, especially on the lower lateral left leg where she sleeps on that side.  Denies melena, bleeding per rectum.  Endorses 3-4 brown stools daily.  Patient has trouble remembering which meds she is taking but ran out of several prescriptions several weeks ago so not clear she has been compliant with diuretics, Cipro, midodrine, rifaximin.  Has been compliant with lactulose ?Underwent 2 L thoracentesis this morning.  Breathing improved. ? ?Na 125.  Normal renal function, GFR. ?T. bili 12.2.  Alk phos 129.  AST/ALT 49/22. ?Hgb 7.3.  MCV 94.  Platelets 27 K.  Previous low of 31 on 11/06/2021 and ranging mid 30s to high 50s since then. ?INR 3.9.  As high as 5 in 07/2021.  As low as 2.9 in early January 2023. ?MELD-Na is 31.   ?CXR showing progression of right pleural effusion which now is occupying lower two thirds of the right hemithorax ? ?Lives with her mother in [place].  Says last alcohol was in early February 2023 ? ? ?Past Medical History:  ?Diagnosis  Date  ? Alcohol abuse   ? Alcoholic cirrhosis of liver with ascites ([place])   ? Anemia   ? Anxiety   ? Hemorrhoid   ? Hepatitis B   ? Major depressive disorder   ? PTSD (post-traumatic stress disorder)   ? Substance abuse ([place])   ? ? ?Past Surgical History:  ?Procedure Laterality Date  ? BIOPSY  08/29/2021  ? Procedure: BIOPSY;  Surgeon: Daryel November, MD;  Location: [facility];  Service: Gastroenterology;;  ? CESAREAN SECTION    ? CHOLECYSTECTOMY    ? COLONOSCOPY WITH  PROPOFOL N/A 08/29/2021  ? Procedure: COLONOSCOPY WITH PROPOFOL;  Surgeon: Daryel November, MD;  Location: [facility];  Service: Gastroenterology;  Laterality: N/A;  ? ESOPHAGOGASTRODUODENOSCOPY (EGD) WITH PROPOFOL N/A 08/29/2021  ? Procedure: ESOPHAGOGASTRODUODENOSCOPY (EGD) WITH PROPOFOL;  Surgeon: Daryel November, MD;  Location: [facility];  Service: Gastroenterology;  Laterality: N/A;  ? IR THORACENTESIS ASP PLEURAL SPACE W/IMG GUIDE  11/23/2021  ? MOUTH SURGERY    ? TUBAL LIGATION Bilateral 07/22/2014  ? Procedure: POST PARTUM TUBAL LIGATION;  Surgeon: Woodroe Mode, MD;  Location: [facility];  Service: Gynecology;  Laterality: Bilateral;  ? ? ?Prior to Admission medications   ?Medication Sig Start Date End Date Taking? Authorizing Provider  ?folic acid (FOLVITE) 1 MG tablet Take 1 tablet (1 mg total) by mouth daily. 08/30/21  Yes Nita Sells, MD  ?furosemide (LASIX) 40 MG tablet Take 1 tablet (40 mg total) by mouth daily. 12/21/21  Yes Daryel November, MD  ?lactulose (CHRONULAC) 10 GM/15ML solution Take 30 mLs (20 g total) by mouth 2 (two) times daily. ?Patient taking differently: Take 20 g by mouth daily. 09/27/21  Yes Daryel November, MD  ?ondansetron (ZOFRAN-ODT) 4 MG disintegrating tablet Take 1 tablet (4 mg total) by mouth every 8 (eight) hours as needed for nausea or vomiting. 11/11/21  Yes Darliss Cheney, MD  ?pantoprazole (PROTONIX) 40 MG tablet Take 1 tablet (40 mg total) by mouth daily. ?Patient taking differently: Take 40 mg by mouth daily as needed (heartburn). 08/30/21  Yes Nita Sells, MD  ?rifaximin (XIFAXAN) 550 MG TABS tablet Take 1 tablet (550 mg total) by mouth 2 (two) times daily. ?Patient taking differently: Take 550 mg by mouth 2 (two) times daily. Continuously 09/27/21  Yes Daryel November, MD  ?spironolactone (ALDACTONE) 50 MG tablet Take 1 tablet (50 mg total) by mouth daily. 12/21/21  Yes Daryel November, MD  ?ciprofloxacin (CIPRO) 500 MG tablet  Take 1 tablet (500 mg total) by mouth daily. ?Patient not taking: Reported on 01/17/2022 09/27/21   Daryel November, MD  ? ? ?Scheduled Meds: ? ciprofloxacin  500 mg Oral Daily  ? folic acid  1 mg Oral Daily  ? furosemide  40 mg Oral Daily  ? lactulose  20 g Oral Daily  ? midodrine  10 mg Oral TID WC  ? pantoprazole  40 mg Oral Daily  ? rifaximin  550 mg Oral BID  ? spironolactone  50 mg Oral Daily  ? ?Infusions: ? ?PRN Meds: ?guaiFENesin, hydrALAZINE, ipratropium-albuterol, metoprolol tartrate, ondansetron **OR** ondansetron (ZOFRAN) IV, senna-docusate ? ? ?Allergies as of 01/17/2022 - Review Complete 01/17/2022  ?Allergen Reaction Noted  ? Vitamin k and related Hives, Shortness Of Breath, Itching, and Rash 09/24/2017  ? Vancomycin Hives and Itching 09/20/2017  ? ? ?Family History  ?Problem Relation Age of Onset  ? Diabetes Maternal Grandmother   ? Diabetes Paternal Grandfather   ? ? ?  Social History  ? ?Socioeconomic History  ? Marital status: Single  ?  Spouse name: Not on file  ? Number of children: Not on file  ? Years of education: Not on file  ? Highest education level: Not on file  ?Occupational History  ? Not on file  ?Tobacco Use  ? Smoking status: Never  ? Smokeless tobacco: Never  ?Vaping Use  ? Vaping Use: Never used  ?Substance and Sexual Activity  ? Alcohol use: Yes  ?  Comment: 12/03/2021  ? Drug use: No  ? Sexual activity: Yes  ?  Birth control/protection: None  ?Other Topics Concern  ? Not on file  ?Social History Narrative  ? Not on file  ? ?Social Determinants of Health  ? ?Financial Resource Strain: Not on file  ?Food Insecurity: Not on file  ?Transportation Needs: Not on file  ?Physical Activity: Not on file  ?Stress: Not on file  ?Social Connections: Not on file  ?Intimate Partner Violence: Not on file  ? ? ?REVIEW OF SYSTEMS: ?Constitutional: Occasional fatigue.  No profound weakness. ?ENT: Epistaxis as per HPI. ?Pulm: Dyspnea without cough, improved following thoracentesis. ?CV:  No  palpitations, no angina.  Occasional mild LE edema.  ?GU:  No hematuria, no frequency. ?GI: See HPI.  No dysphagia. ?Heme: Spontaneous bruising and epistaxis as above ?Transfusions: Received PRBC in 08/2017, F

## 2022-01-18 NOTE — Progress Notes (Signed)
?PROGRESS NOTE ? ? ? ?Victoria Gordon  WUJ:[id number] DOB: [birth year] DOA: 01/17/2022 ?PCP: Patient, [name]  ? ?Brief Narrative:  ?41 year old with history of alcohol-related liver cirrhosis, decompensated cirrhosis-esophageal varices, portal gastropathy, recurrent right-sided pleural effusion, pancytopenia, chronic hyponatremia, PTSD, anxiety, substance abuse disorder came to the ED with complaints of dyspnea.  Patient has ran out of several of her prescriptions and has not been taking her medications.  Upon admission she was noted to be hyponatremic, chest x-ray showing large right-sided pleural effusion. ? ? ?Assessment & Plan: ? Principal Problem: ?  Recurrent right pleural effusion ?Active Problems: ?  Alcoholic cirrhosis of liver ([place]) ?  Alcohol use disorder, severe, dependence ([place]) ?  Pancytopenia ([place]) ?  Hyperbilirubinemia ?  Esophageal varices without bleeding ([place]) ?  Chronic hyponatremia ?  ? ? ?Assessment and Plan: ?* Recurrent right pleural effusion ?Ultrasound-guided thoracentesis has been ordered along with lab work ? ?Alcoholic cirrhosis of liver ([place]) ?Esophageal varices without bleeding ?Portal hypertensive gastropathy ?History of SBP ?History of hepatic encephalopathy ? ?Has history of decompensated liver cirrhosis.  Follows outpatient GI Dr. Candis Schatz.  Has not been taking most of her medications at home.  Home medications have been restarted including-Lasix, Aldactone, rifaximin, lactulose, midodrine.  Cipro for SBP prophylaxis ?Have requested GI team to evaluate the patient to assist with management. ? ? ?Alcohol use disorder, severe, dependence ([place]) ?Reports no alcohol use in 2 months.  Currently on withdrawal protocol. ? ? ?Pancytopenia ([place]) ?Chronic, secondary to cirrhosis.  Continue to monitor. ? ?Chronic hyponatremia ?Chronic from cirrhosis ? ?Esophageal varices without bleeding ([place]) ?No active bleeding ? ?Hyperbilirubinemia ?Chronic and stable, secondary to  cirrhosis. ? ? ? ? ?  ? ? ? ?DVT prophylaxis: SCDs Start: 01/17/22 2114 ?Code Status: Full ?Family Communication:   ? ?Planned thoracentesis, GI consulted ? ? ?Subjective: ?Patient underwent thoracentesis this morning, about 2 L of fluid was removed.  Soon after she developed hypotension therefore albumin was given.  She also felt nauseous at this time.  During my visit she felt much better and did not have any new complaints.  She admits to me of medication compliance. ? ?Review of Systems ?Otherwise negative except as per HPI, including: ?General: Denies fever, chills, night sweats or unintended weight loss. ?Resp: Denies diminished breath sounds especially at the bases ?Cardiac: Denies chest pain, palpitations, orthopnea, paroxysmal nocturnal dyspnea. ?GI: Denies abdominal pain, nausea, vomiting, diarrhea or constipation ?GU: Denies dysuria, frequency, hesitancy or incontinence ?MS: Denies muscle aches, joint pain or swelling ?Neuro: Denies headache, neurologic deficits (focal weakness, numbness, tingling), abnormal gait ?Psych: Denies anxiety, depression, SI/HI/AVH ?Skin: Denies new rashes or lesions ?ID: Denies sick contacts, exotic exposures, travel ? ?Examination: ? ?General exam: Appears calm and comfortable.  Scleral icterus ?Respiratory system: Clear to auscultation. Respiratory effort normal. ?Cardiovascular system: S1 & S2 heard, RRR. No JVD, murmurs, rubs, gallops or clicks. No pedal edema. ?Gastrointestinal system: Abdomen is nondistended, soft and nontender. No organomegaly or masses felt. Normal bowel sounds heard. ?Central nervous system: Alert and oriented. No focal neurological deficits. ?Extremities: Symmetric 5 x 5 power. ?Skin: Jaundiced ?Psychiatry: Judgement and insight appear normal. Mood & affect appropriate.  ? ? ? ?Objective: ?Vitals:  ? 01/18/22 1019 01/18/22 1025 01/18/22 1027 01/18/22 1051  ?BP: (!) 75/37 (!) 102/55 (!) 100/51 107/65  ?Pulse: 76   72  ?Resp:    18  ?Temp:    98.6 ?F  (37 ?C)  ?TempSrc:    Oral  ?SpO2:  99%     ?Weight:      ?Height:      ? ? ?Intake/Output Summary (Last 24 hours) at 01/18/2022 1306 ?Last data filed at 01/18/2022 1017 ?Gross per 24 hour  ?Intake 220 ml  ?Output 1950 ml  ?Net -1730 ml  ? ?Filed Weights  ? 01/17/22 1630  ?Weight: 81.6 kg  ? ? ? ?Data Reviewed:  ? ?CBC: ?Recent Labs  ?Lab 01/17/22 ?1005 01/18/22 ?0429  ?WBC 3.5* 2.3*  ?NEUTROABS 2.4  --   ?HGB 8.7* 7.3*  ?HCT 26.1* 21.2*  ?MCV 98.1 94.6  ?PLT 36* 27*  ? ?Basic Metabolic Panel: ?Recent Labs  ?Lab 01/17/22 ?1005 01/18/22 ?0429  ?NA 128* 125*  ?K 3.4* 3.8  ?CL 102 102  ?CO2 19* 17*  ?GLUCOSE 89 80  ?BUN 5* 6  ?CREATININE 0.56 0.48  ?CALCIUM 7.7* 7.2*  ? ?GFR: ?Estimated Creatinine Clearance: 92.3 mL/min (by C-G formula based on SCr of 0.48 mg/dL). ?Liver Function Tests: ?Recent Labs  ?Lab 01/17/22 ?1005 01/18/22 ?0429  ?AST 60* 49*  ?ALT 25 22  ?ALKPHOS 183* 129*  ?BILITOT 14.0* 12.2*  ?PROT 6.0* 4.9*  ?ALBUMIN 2.1* 1.7*  ? ?Recent Labs  ?Lab 01/17/22 ?1005  ?LIPASE 58*  ? ?Recent Labs  ?Lab 01/17/22 ?2325  ?AMMONIA 48*  ? ?Coagulation Profile: ?Recent Labs  ?Lab 01/17/22 ?1005 01/18/22 ?0429  ?INR 3.5* 3.9*  ? ?Cardiac Enzymes: ?No results for input(s): CKTOTAL, CKMB, CKMBINDEX, TROPONINI in the last 168 hours. ?BNP (last 3 results) ?No results for input(s): PROBNP in the last 8760 hours. ?HbA1C: ?No results for input(s): HGBA1C in the last 72 hours. ?CBG: ?No results for input(s): GLUCAP in the last 168 hours. ?Lipid Profile: ?No results for input(s): CHOL, HDL, LDLCALC, TRIG, CHOLHDL, LDLDIRECT in the last 72 hours. ?Thyroid Function Tests: ?No results for input(s): TSH, T4TOTAL, FREET4, T3FREE, THYROIDAB in the last 72 hours. ?Anemia Panel: ?No results for input(s): VITAMINB12, FOLATE, FERRITIN, TIBC, IRON, RETICCTPCT in the last 72 hours. ?Sepsis Labs: ?No results for input(s): PROCALCITON, LATICACIDVEN in the last 168 hours. ? ?Recent Results (from the past 240 hour(s))  ?Resp Panel by RT-PCR (Flu  A&B, Covid) Nasopharyngeal Swab     Status: None  ? Collection Time: 01/17/22 10:03 PM  ? Specimen: Nasopharyngeal Swab; Nasopharyngeal(NP) swabs in vial transport medium  ?Result Value Ref Range Status  ? SARS Coronavirus 2 by RT PCR NEGATIVE NEGATIVE Final  ?  Comment: (NOTE) ?SARS-CoV-2 target nucleic acids are NOT DETECTED. ? ?The SARS-CoV-2 RNA is generally detectable in upper respiratory ?specimens during the acute phase of infection. The lowest ?concentration of SARS-CoV-2 viral copies this assay can detect is ?138 copies/mL. A negative result does not preclude SARS-Cov-2 ?infection and should not be used as the sole basis for treatment or ?other patient management decisions. A negative result may occur with  ?improper specimen collection/handling, submission of specimen other ?than nasopharyngeal swab, presence of viral mutation(s) within the ?areas targeted by this assay, and inadequate number of viral ?copies(<138 copies/mL). A negative result must be combined with ?clinical observations, patient history, and epidemiological ?information. The expected result is Negative. ? ?Fact Sheet for Patients:  ?EntrepreneurPulse.com.au ? ?Fact Sheet for Healthcare Providers:  ?IncredibleEmployment.be ? ?This test is no t yet approved or cleared by the [place] FDA and  ?has been authorized for detection and/or diagnosis of SARS-CoV-2 by ?FDA under an Emergency Use Authorization (EUA). This EUA will remain  ?in effect (meaning this test can be used) for  the duration of the ?COVID-19 declaration under Section 564(b)(1) of the Act, 21 ?U.S.C.section 360bbb-3(b)(1), unless the authorization is terminated  ?or revoked sooner.  ? ? ?  ? Influenza A by PCR NEGATIVE NEGATIVE Final  ? Influenza B by PCR NEGATIVE NEGATIVE Final  ?  Comment: (NOTE) ?The Xpert Xpress SARS-CoV-2/FLU/RSV plus assay is intended as an aid ?in the diagnosis of influenza from Nasopharyngeal swab specimens  and ?should not be used as a sole basis for treatment. Nasal washings and ?aspirates are unacceptable for Xpert Xpress SARS-CoV-2/FLU/RSV ?testing. ? ?Fact Sheet for Patients: ?http://guzman.com/

## 2022-01-18 NOTE — Assessment & Plan Note (Signed)
No active bleeding 

## 2022-01-18 NOTE — Procedures (Signed)
Thoracentesis  Procedure Note ? ?Victoria Gordon  ?[id number]  ?11-10-1980 ? ?Date:01/18/22  ?Time:10:29 AM  ? ?Provider Performing:Leotta Weingarten C Tamala Julian  ? ?Procedure: Thoracentesis with imaging guidance (76160) ? ?Indication(s) ?Pleural Effusion ? ?Consent ?Risks of the procedure as well as the alternatives and risks of each were explained to the patient and/or caregiver.  Consent for the procedure was obtained and is signed in the bedside chart ? ?Anesthesia ?Topical only with 1% lidocaine  ? ? ?Time Out ?Verified patient identification, verified procedure, site/side was marked, verified correct patient position, special equipment/implants available, medications/allergies/relevant history reviewed, required imaging and test results available. ? ? ?Sterile Technique ?Maximal sterile technique including full sterile barrier drape, hand hygiene, sterile gown, sterile gloves, mask, hair covering, sterile ultrasound probe cover (if used). ? ?Procedure Description ?Ultrasound was used to identify appropriate pleural anatomy for placement and overlying skin marked.  Area of drainage cleaned and draped in sterile fashion. Lidocaine was used to anesthetize the skin and subcutaneous tissue.  2000 cc's of straw appearing fluid was drained from the right pleural space. Catheter then removed and bandaid applied to site. ? ? ?Complications/Tolerance ?Developed nausea and hypotension, ordered for zofran and albumin 50g 25% x 1, will hold off further drainage today ?Chest X-ray is ordered to confirm no post-procedural complication. ? ? ?EBL ?Minimal ? ? ?Specimen(s) ?Pleural fluid for culture and cell count (use 250 neutrophil count as cutoff for SBP equivalent) ? ? ? ? ? ? ? ? ? ? ?

## 2022-01-18 NOTE — Progress Notes (Signed)
Dr. Lyndel Safe at bedside, made this RN aware that patient's puncture site from right thoracentesis today is bleeding.  Band aid at site noted to be saturated, pillow case and bed soiled as well  with serous-sanguinous drainage.  Pressure dressing applied to site, needs addressed.  ?

## 2022-01-18 NOTE — Progress Notes (Signed)
Received critical lab ressult platelet count-27. On call Jamison Neighbor made aware. No new orders made. ?

## 2022-01-19 DIAGNOSIS — J90 Pleural effusion, not elsewhere classified: Secondary | ICD-10-CM | POA: Diagnosis not present

## 2022-01-19 LAB — COMPREHENSIVE METABOLIC PANEL
ALT: 17 U/L (ref 0–44)
AST: 42 U/L — ABNORMAL HIGH (ref 15–41)
Albumin: 2.1 g/dL — ABNORMAL LOW (ref 3.5–5.0)
Alkaline Phosphatase: 117 U/L (ref 38–126)
Anion gap: 7 (ref 5–15)
BUN: 5 mg/dL — ABNORMAL LOW (ref 6–20)
CO2: 17 mmol/L — ABNORMAL LOW (ref 22–32)
Calcium: 7.5 mg/dL — ABNORMAL LOW (ref 8.9–10.3)
Chloride: 104 mmol/L (ref 98–111)
Creatinine, Ser: 0.55 mg/dL (ref 0.44–1.00)
GFR, Estimated: >60 mL/min (ref >60)
Glucose, Bld: 99 mg/dL (ref 70–99)
Potassium: 3.8 mmol/L (ref 3.5–5.1)
Sodium: 128 mmol/L — ABNORMAL LOW (ref 135–145)
Total Bilirubin: 11.7 mg/dL — ABNORMAL HIGH (ref 0.3–1.2)
Total Protein: 4.7 g/dL — ABNORMAL LOW (ref 6.5–8.1)

## 2022-01-19 LAB — PROTIME-INR
INR: 4 — ABNORMAL HIGH (ref 0.8–1.2)
Prothrombin Time: 39.3 seconds — ABNORMAL HIGH (ref 11.4–15.2)

## 2022-01-19 LAB — MAGNESIUM: Magnesium: 1.8 mg/dL (ref 1.7–2.4)

## 2022-01-19 LAB — PATHOLOGIST SMEAR REVIEW

## 2022-01-19 NOTE — Plan of Care (Signed)

## 2022-01-19 NOTE — Progress Notes (Signed)
?PROGRESS NOTE ? ? ? ?Victoria Gordon  ZYS:[id number] DOB: 1981-05-06 DOA: 01/17/2022 ?PCP: Patient, [name]  ? ?Brief Narrative:  ?41 year old with history of alcohol-related liver cirrhosis, decompensated cirrhosis-esophageal varices, portal gastropathy, recurrent right-sided pleural effusion, pancytopenia, chronic hyponatremia, PTSD, anxiety, substance abuse disorder came to the ED with complaints of dyspnea.  Patient has ran out of several of her prescriptions and has not been taking her medications.  Upon admission she was noted to be hyponatremic, chest x-ray showing large right-sided pleural effusion.  Underwent thoracentesis by pulmonary on 3/21 showing transudative fluid.  GI consulted, resumed home diuretics.  Palliative care team consulted for goals of care conversation. ? ? ?Assessment & Plan: ? Principal Problem: ?  Recurrent right pleural effusion ?Active Problems: ?  Alcoholic cirrhosis of liver ([place]) ?  Alcohol use disorder, severe, dependence ([place]) ?  Pancytopenia ([place]) ?  Hyperbilirubinemia ?  Esophageal varices without bleeding ([place]) ?  Chronic hyponatremia ?  ? ? ?Assessment and Plan: ?* Recurrent right pleural effusion ?Status post thoracentesis 3/21 showing transudative fluid.  About 2 L removed.  Currently stable, continue to monitor.  Appreciate pulmonary input ? ?Alcoholic cirrhosis of liver ([place]) ?Esophageal varices without bleeding ?Portal hypertensive gastropathy ?History of SBP ?History of hepatic encephalopathy ? ?Has history of decompensated liver cirrhosis.  Follows outpatient GI Dr. Candis Schatz.  Has not been taking most of her medications at home.  Home medications have been restarted including-Lasix, Aldactone, rifaximin, lactulose, midodrine.  Cipro for SBP prophylaxis ?Seen by GI team. ?Poor prognosis, palliative care team consulted ? ?Alcohol use disorder, severe, dependence ([place]) ?Reports no alcohol use in 2 months.  Currently on withdrawal  protocol. ? ? ?Pancytopenia ([place]) ?Chronic, secondary to cirrhosis.  Continue to monitor. ? ?Chronic hyponatremia ?Chronic from cirrhosis ? ?Esophageal varices without bleeding ([place]) ?No active bleeding ? ?Hyperbilirubinemia ?Chronic and stable, secondary to cirrhosis. ? ? ? ?Overall poor prognosis-palliative care team consulted. ?  ? ? ? ?DVT prophylaxis: SCDs Start: 01/17/22 2114 ?Code Status: Full ?Family Communication:   ? ?Maintain hospital stay for diuretic adjustment.  Palliative care team, and GI is following. ? ? ?Subjective: ?Symptomatically patient is feeling better in terms of shortness of breath.  Her abdomen is still slightly distended.  She is aware that overall she has very poor prognosis. ? ? ?Examination: ? ?General exam: Appears calm and comfortable.  Scleral icterus ?Respiratory system: Clear to auscultation. Respiratory effort normal. ?Cardiovascular system: S1 & S2 heard, RRR. No JVD, murmurs, rubs, gallops or clicks. No pedal edema. ?Gastrointestinal system: Abdomen is nondistended, soft and nontender. No organomegaly or masses felt. Normal bowel sounds heard. ?Central nervous system: Alert and oriented. No focal neurological deficits. ?Extremities: Symmetric 5 x 5 power. ?Skin: Jaundiced ?Psychiatry: Judgement and insight appear normal. Mood & affect appropriate.  ? ? ? ?Objective: ?Vitals:  ? 01/18/22 2047 01/19/22 9323 01/19/22 0343 01/19/22 0736  ?BP: 103/60 (!) 98/52  (!) 96/55  ?Pulse: 71 73  69  ?Resp: '18 16  16  '$ ?Temp: 98 ?F (36.7 ?C) 98.2 ?F (36.8 ?C)  98.4 ?F (36.9 ?C)  ?TempSrc: Oral Oral  Oral  ?SpO2: 100% 99%  100%  ?Weight:   85 kg   ?Height:      ? ? ?Intake/Output Summary (Last 24 hours) at 01/19/2022 1247 ?Last data filed at 01/19/2022 0600 ?Gross per 24 hour  ?Intake 1033.33 ml  ?Output --  ?Net 1033.33 ml  ? ?Filed Weights  ? 01/17/22 1630 01/19/22 0343  ?  Weight: 81.6 kg 85 kg  ? ? ? ?Data Reviewed:  ? ?CBC: ?Recent Labs  ?Lab 01/17/22 ?1005 01/18/22 ?0429  ?WBC 3.5* 2.3*   ?NEUTROABS 2.4  --   ?HGB 8.7* 7.3*  ?HCT 26.1* 21.2*  ?MCV 98.1 94.6  ?PLT 36* 27*  ? ?Basic Metabolic Panel: ?Recent Labs  ?Lab 01/17/22 ?1005 01/18/22 ?0429 01/19/22 ?0124  ?NA 128* 125* 128*  ?K 3.4* 3.8 3.8  ?CL 102 102 104  ?CO2 19* 17* 17*  ?GLUCOSE 89 80 99  ?BUN 5* 6 5*  ?CREATININE 0.56 0.48 0.55  ?CALCIUM 7.7* 7.2* 7.5*  ?MG  --   --  1.8  ? ?GFR: ?Estimated Creatinine Clearance: 94.4 mL/min (by C-G formula based on SCr of 0.55 mg/dL). ?Liver Function Tests: ?Recent Labs  ?Lab 01/17/22 ?1005 01/18/22 ?0429 01/19/22 ?0124  ?AST 60* 49* 42*  ?ALT '25 22 17  '$ ?ALKPHOS 183* 129* 117  ?BILITOT 14.0* 12.2* 11.7*  ?PROT 6.0* 4.9* 4.7*  ?ALBUMIN 2.1* 1.7* 2.1*  ? ?Recent Labs  ?Lab 01/17/22 ?1005  ?LIPASE 58*  ? ?Recent Labs  ?Lab 01/17/22 ?2325  ?AMMONIA 48*  ? ?Coagulation Profile: ?Recent Labs  ?Lab 01/17/22 ?1005 01/18/22 ?0429 01/19/22 ?0762  ?INR 3.5* 3.9* 4.0*  ? ?Cardiac Enzymes: ?No results for input(s): CKTOTAL, CKMB, CKMBINDEX, TROPONINI in the last 168 hours. ?BNP (last 3 results) ?No results for input(s): PROBNP in the last 8760 hours. ?HbA1C: ?No results for input(s): HGBA1C in the last 72 hours. ?CBG: ?No results for input(s): GLUCAP in the last 168 hours. ?Lipid Profile: ?No results for input(s): CHOL, HDL, LDLCALC, TRIG, CHOLHDL, LDLDIRECT in the last 72 hours. ?Thyroid Function Tests: ?No results for input(s): TSH, T4TOTAL, FREET4, T3FREE, THYROIDAB in the last 72 hours. ?Anemia Panel: ?No results for input(s): VITAMINB12, FOLATE, FERRITIN, TIBC, IRON, RETICCTPCT in the last 72 hours. ?Sepsis Labs: ?No results for input(s): PROCALCITON, LATICACIDVEN in the last 168 hours. ? ?Recent Results (from the past 240 hour(s))  ?Resp Panel by RT-PCR (Flu A&B, Covid) Nasopharyngeal Swab     Status: None  ? Collection Time: 01/17/22 10:03 PM  ? Specimen: Nasopharyngeal Swab; Nasopharyngeal(NP) swabs in vial transport medium  ?Result Value Ref Range Status  ? SARS Coronavirus 2 by RT PCR NEGATIVE NEGATIVE  Final  ?  Comment: (NOTE) ?SARS-CoV-2 target nucleic acids are NOT DETECTED. ? ?The SARS-CoV-2 RNA is generally detectable in upper respiratory ?specimens during the acute phase of infection. The lowest ?concentration of SARS-CoV-2 viral copies this assay can detect is ?138 copies/mL. A negative result does not preclude SARS-Cov-2 ?infection and should not be used as the sole basis for treatment or ?other patient management decisions. A negative result may occur with  ?improper specimen collection/handling, submission of specimen other ?than nasopharyngeal swab, presence of viral mutation(s) within the ?areas targeted by this assay, and inadequate number of viral ?copies(<138 copies/mL). A negative result must be combined with ?clinical observations, patient history, and epidemiological ?information. The expected result is Negative. ? ?Fact Sheet for Patients:  ?EntrepreneurPulse.com.au ? ?Fact Sheet for Healthcare Providers:  ?IncredibleEmployment.be ? ?This test is no t yet approved or cleared by the [place] FDA and  ?has been authorized for detection and/or diagnosis of SARS-CoV-2 by ?FDA under an Emergency Use Authorization (EUA). This EUA will remain  ?in effect (meaning this test can be used) for the duration of the ?COVID-19 declaration under Section 564(b)(1) of the Act, 21 ?U.S.C.section 360bbb-3(b)(1), unless the authorization is terminated  ?or revoked sooner.  ? ? ?  ?  Influenza A by PCR NEGATIVE NEGATIVE Final  ? Influenza B by PCR NEGATIVE NEGATIVE Final  ?  Comment: (NOTE) ?The Xpert Xpress SARS-CoV-2/FLU/RSV plus assay is intended as an aid ?in the diagnosis of influenza from Nasopharyngeal swab specimens and ?should not be used as a sole basis for treatment. Nasal washings and ?aspirates are unacceptable for Xpert Xpress SARS-CoV-2/FLU/RSV ?testing. ? ?Fact Sheet for Patients: ?EntrepreneurPulse.com.au ? ?Fact Sheet for Healthcare  Providers: ?IncredibleEmployment.be ? ?This test is not yet approved or cleared by the [place] FDA and ?has been authorized for detection and/or diagnosis of SARS-CoV-2 by ?FDA under an Emergency Use Authorization (

## 2022-01-19 NOTE — Progress Notes (Signed)
IV leaking, d/c`d. Pt refused to get another IV. Jamison Neighbor made aware. ?

## 2022-01-19 NOTE — Assessment & Plan Note (Deleted)
Status post thoracentesis 3/21 showing transudative fluid.  About 2 L removed.  Currently stable, continue to monitor.  Appreciate pulmonary input ?

## 2022-01-19 NOTE — Progress Notes (Addendum)
? ?       Daily Rounding Note ? ?01/19/2022, 9:26 AM ? LOS: 1 day  ? ?SUBJECTIVE:   ?Chief complaint:   decompensated cirrhosis.  Coagulopathy.  Hepatic hydrothorax.  Variable med compliance.    ? ?Bleeding from thora puncture site noted yest PM. Continues oozing today  ? ?RN gave Lasix but held aldactone due to ABP in 90s.  Pt says she has some dizziness if she gets up suddenly but has learned to move slowly to address this.  No syncope. ?Some discomfort in RUQ, not severe.   ? ?OBJECTIVE:        ? Vital signs in last 24 hours:    ?Temp:  [98 ?F (36.7 ?C)-98.6 ?F (37 ?C)] 98.4 ?F (36.9 ?C) (03/22 0736) ?Pulse Rate:  [67-93] 69 (03/22 0736) ?Resp:  [16-18] 16 (03/22 0736) ?BP: (75-107)/(37-71) 96/55 (03/22 0736) ?SpO2:  [99 %-100 %] 100 % (03/22 0736) ?Weight:  [85 kg] 85 kg (03/22 0343) ?Last BM Date : 01/16/22 ?Filed Weights  ? 01/17/22 1630 01/19/22 0343  ?Weight: 81.6 kg 85 kg  ? ?General: icteric, jaundiced.  comfortable   ?Heart: RRR ?Chest: Clear bil though reduced BS at bases.  No dyspnea.  Slight bruise at posterior thora site, oozes small amount of blood when pressure applied.   ?Abdomen: soft, ND, active BS.  Mild tenderness RUQ and R mid abdomen.     ?Extremities: no CCE ?Neuro/Psych:  oriented x 3.  No tremors or asterixis.   ? ?Intake/Output from previous day: ?03/21 0701 - 03/22 0700 ?In: 1373.3 [P.O.:1180; IV Piggyback:193.3] ?Out: 1950  ? ?Intake/Output this shift: ?No intake/output data recorded. ? ?Lab Results: ?Recent Labs  ?  01/17/22 ?1005 01/18/22 ?0429  ?WBC 3.5* 2.3*  ?HGB 8.7* 7.3*  ?HCT 26.1* 21.2*  ?PLT 36* 27*  ? ?BMET ?Recent Labs  ?  01/17/22 ?1005 01/18/22 ?0429 01/19/22 ?0124  ?NA 128* 125* 128*  ?K 3.4* 3.8 3.8  ?CL 102 102 104  ?CO2 19* 17* 17*  ?GLUCOSE 89 80 99  ?BUN 5* 6 5*  ?CREATININE 0.56 0.48 0.55  ?CALCIUM 7.7* 7.2* 7.5*  ? ?LFT ?Recent Labs  ?  01/17/22 ?1005 01/18/22 ?0429 01/19/22 ?0124  ?PROT 6.0* 4.9* 4.7*  ?ALBUMIN  2.1* 1.7* 2.1*  ?AST 60* 49* 42*  ?ALT '25 22 17  '$ ?ALKPHOS 183* 129* 117  ?BILITOT 14.0* 12.2* 11.7*  ? ?PT/INR ?Recent Labs  ?  01/18/22 ?0429 01/19/22 ?7026  ?LABPROT 38.3* 39.3*  ?INR 3.9* 4.0*  ? ?Recent Labs  ?Lab 01/17/22 ?1005 01/17/22 ?2325 01/18/22 ?0429 01/19/22 ?0124 01/19/22 ?3785  ?AST 60*  --  49* 42*  --   ?ALT 25  --  22 17  --   ?ALKPHOS 183*  --  129* 117  --   ?BILITOT 14.0*  --  12.2* 11.7*  --   ?PROT 6.0*  --  4.9* 4.7*  --   ?ALBUMIN 2.1*  --  1.7* 2.1*  --   ?AMMONIA  --  48*  --   --   --   ?INR 3.5*  --  3.9*  --  4.0*  ?LIPASE 58*  --   --   --   --   ?PLT 36*  --  27*  --   --   ? ? ?  Latest Ref Rng & Units 06/06/2021  ?  2:06 PM  ?Hepatitis  ?Hep B Surface Ag NON REACTIVE NON REACTIVE    ?Hep C Ab  NON REACTIVE NON REACTIVE    ? ?MELD-Na score: 34 at 01/19/2022  5:50 AM ?MELD score: 31 at 01/19/2022  5:50 AM ?Calculated from: ?Serum Creatinine: 0.55 mg/dL (Using min of 1 mg/dL) at 01/19/2022  1:24 AM ?Serum Sodium: 128 mmol/L at 01/19/2022  1:24 AM ?Total Bilirubin: 11.7 mg/dL at 01/19/2022  1:24 AM ?INR(ratio): 4.0 at 01/19/2022  5:50 AM ?Age: 41 years  ? ? ?Hepatitis Panel ?No results for input(s): HEPBSAG, HCVAB, HEPAIGM, HEPBIGM in the last 72 hours. ? ?Studies/Results: ?DG Chest 2 View ? ?Result Date: 01/17/2022 ?CLINICAL DATA:  Dyspnea. Abdominal swelling and shortness of breath. History of ascites. EXAM: CHEST - 2 VIEW COMPARISON:  Radiograph 11/23/2021, CT 11/05/2021 FINDINGS: Progressive right pleural effusion, large in size occupying the lower 2/3 of right hemithorax. Right suprahilar atelectasis peer there is mild leftward shift of the heart. Retrocardiac opacity corresponds to prominent esophageal varices on prior CT. There is no significant left pleural effusion, the left lung is clear. No pneumothorax or pulmonary edema. IMPRESSION: Progressive right pleural effusion, large in size, occupying the lower 2/3 of right hemithorax. Electronically Signed   By: Keith Rake M.D.   On:  01/17/2022 19:51  ? ?US Abdomen Limited ? ?Result Date: 01/18/2022 ?CLINICAL DATA:  Cirrhosis and ascites.  Cholecystectomy. EXAM: ULTRASOUND ABDOMEN LIMITED RIGHT UPPER QUADRANT COMPARISON:  Ultrasound abdomen 11/06/2021. CT chest abdomen and pelvis 11/05/2021. FINDINGS: Gallbladder: Surgically absent. Common bile duct: Diameter: 8 mm Liver: No focal lesion identified. Small in size with increased echogenicity and nodular contour. Mild intrahepatic biliary ductal dilatation. Portal vein is patent on color Doppler imaging with normal direction of blood flow towards the liver. Other: Right pleural effusion. Moderate ascites. Enlarged spleen measuring up to 17.7 cm. IMPRESSION: 1. Cholecystectomy. There is mild intra and extrahepatic biliary ductal dilatation which appears grossly unchanged from prior CT. Recommend correlation with lab values. 2. Stable moderate splenomegaly. 3. Stable liver cirrhosis. 4. Moderate ascites. 5. Right pleural effusion. Electronically Signed   By: Ronney Asters M.D.   On: 01/18/2022 16:04  ? ?DG CHEST PORT 1 VIEW ? ?Result Date: 01/18/2022 ?CLINICAL DATA:  Recent thoracentesis EXAM: PORTABLE CHEST 1 VIEW COMPARISON:  01/17/2022 FINDINGS: Improvement in the right effusion following thoracentesis. Small to moderate residual right effusion remains extending along the right lateral chest. No pneumothorax. Improved aeration of the right lung with residual right lower lung atelectasis. Left lung remains clear. Stable heart size and vascularity. Trachea midline. No acute osseous finding. IMPRESSION: Improved in the right effusion following thoracentesis. No pneumothorax. Electronically Signed   By: Jerilynn Mages.  Shick M.D.   On: 01/18/2022 10:50   ? ?ASSESMENT:  ? ?Decompensated cirrhosis due to alcohol.  MELD 31, MELD-Na 34.  May have lingering ETOH hepatitis.  T. bili improved: 14 ..  11.7.  Fortunately, thus far no evidence for AKI, HRS. ?  ?Alcohol use disorder, severe, historically not in remission but  pt claims abstinent since jearly. ?  ?  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  Prior thoracentesis, 2 L  thora 3/21, fluid analysis pndg.  w high MELD,  not a candidate for TIPS.   ?  ?Ascites with hx SBP.  Moderate ascites on 3/21 ultrasound.  Pt today confirms has been taking diuretics and Cipro PTA.   ?  ?Hematologic complications of cirrhosis including severe thrombocytopenia, progressive at 27 today (splenomegaly on [place]), coagulopathy also progressive today at 4.  Note that Vit K is listed as high severity allergic reaction in  2018 but tolerated vitamin K in 07/2021 without reaction.  She displays strong bleeding tendency from puncture sites.   ?  ?Normocytic anemia.  Previous PRBCs, none this admission.  Hgb 8.7 .. 7.3.  Underwent EGD and colonoscopy in October 2022 findings noted above.  Had large varices which were not banded due to coagulopathy and thrombocytopenia.  Other findings included erosive gastropathy, portal hypertensive gastropathy, contact bleeding throughout colon. ?  ?  Esoph varices, never known to have bled.  As above.   ?  ?  Hx HE. Rifaximin, Lactulose in place.  MS not compromised.   ? ?  Hyponatremia.  On 2 gm Na/1.8 mL fluids diet.  Na reletively stable, 128 today.   ? ? ?PLAN  ? ?  Paracentesis?, will d/w Dr Lyndel Safe as at high risk for post tap bleeding.  May be safest to let diuretics address ascites.   ? ? ?  3 days IV vitamin K.  Probably is not going to confer much benefit and does have history of some sort of reaction to this medication 5 years ago but none 6 months ago so will discuss with MD before ordering. ? ?  Pall care, [place] consult.  May qualify for home hospice services.  I d/w pt who is agreeable and placed request.   ? ? ? ?Victoria Gordon  01/19/2022, 9:26 AM ?Phone [phone]  ? ? Attending physician's note  ? ?I have taken history, reviewed the chart and examined the patient. I performed a substantive portion of this encounter, including complete  performance of at least one of the key components, in conjunction with the APP. I agree with the Advanced Practitioner's note, impression and recommendations.  ? ?Some bleeding from thoracocentesis site-better

## 2022-01-20 ENCOUNTER — Other Ambulatory Visit (HOSPITAL_COMMUNITY): Payer: Self-pay

## 2022-01-20 DIAGNOSIS — Z515 Encounter for palliative care: Secondary | ICD-10-CM

## 2022-01-20 DIAGNOSIS — Z7189 Other specified counseling: Secondary | ICD-10-CM

## 2022-01-20 DIAGNOSIS — F1020 Alcohol dependence, uncomplicated: Secondary | ICD-10-CM

## 2022-01-20 DIAGNOSIS — K7031 Alcoholic cirrhosis of liver with ascites: Secondary | ICD-10-CM

## 2022-01-20 DIAGNOSIS — J90 Pleural effusion, not elsewhere classified: Secondary | ICD-10-CM | POA: Diagnosis not present

## 2022-01-20 LAB — COMPREHENSIVE METABOLIC PANEL
ALT: 20 U/L (ref 0–44)
AST: 44 U/L — ABNORMAL HIGH (ref 15–41)
Albumin: 2.1 g/dL — ABNORMAL LOW (ref 3.5–5.0)
Alkaline Phosphatase: 130 U/L — ABNORMAL HIGH (ref 38–126)
Anion gap: 3 — ABNORMAL LOW (ref 5–15)
BUN: <5 mg/dL — ABNORMAL LOW (ref 6–20)
CO2: 21 mmol/L — ABNORMAL LOW (ref 22–32)
Calcium: 7.4 mg/dL — ABNORMAL LOW (ref 8.9–10.3)
Chloride: 105 mmol/L (ref 98–111)
Creatinine, Ser: 0.58 mg/dL (ref 0.44–1.00)
GFR, Estimated: >60 mL/min (ref >60)
Glucose, Bld: 98 mg/dL (ref 70–99)
Potassium: 3.5 mmol/L (ref 3.5–5.1)
Sodium: 129 mmol/L — ABNORMAL LOW (ref 135–145)
Total Bilirubin: 12.7 mg/dL — ABNORMAL HIGH (ref 0.3–1.2)
Total Protein: 5 g/dL — ABNORMAL LOW (ref 6.5–8.1)

## 2022-01-20 LAB — MAGNESIUM: Magnesium: 1.8 mg/dL (ref 1.7–2.4)

## 2022-01-20 MED ORDER — CIPROFLOXACIN HCL 500 MG PO TABS
500.0000 mg | ORAL_TABLET | Freq: Every day | ORAL | 1 refills | Status: DC
  Filled 2022-01-20: qty 90, 90d supply, fill #0

## 2022-01-20 MED ORDER — SPIRONOLACTONE 50 MG PO TABS
50.0000 mg | ORAL_TABLET | Freq: Every day | ORAL | 0 refills | Status: DC
  Filled 2022-01-20: qty 30, 30d supply, fill #0

## 2022-01-20 MED ORDER — LACTULOSE ENCEPHALOPATHY 10 GM/15ML PO SOLN
20.0000 g | Freq: Two times a day (BID) | ORAL | 1 refills | Status: AC
  Filled 2022-01-20: qty 1892, 32d supply, fill #0

## 2022-01-20 MED ORDER — RIFAXIMIN 550 MG PO TABS
550.0000 mg | ORAL_TABLET | Freq: Two times a day (BID) | ORAL | 1 refills | Status: AC
  Filled 2022-01-20: qty 60, 30d supply, fill #0

## 2022-01-20 MED ORDER — MIDODRINE HCL 10 MG PO TABS
10.0000 mg | ORAL_TABLET | Freq: Three times a day (TID) | ORAL | 1 refills | Status: AC
  Filled 2022-01-20: qty 90, 30d supply, fill #0

## 2022-01-20 MED ORDER — PANTOPRAZOLE SODIUM 40 MG PO TBEC
40.0000 mg | DELAYED_RELEASE_TABLET | Freq: Every day | ORAL | 1 refills | Status: AC
  Filled 2022-01-20: qty 30, 30d supply, fill #0

## 2022-01-20 MED ORDER — FUROSEMIDE 40 MG PO TABS
40.0000 mg | ORAL_TABLET | Freq: Every day | ORAL | 0 refills | Status: DC
  Filled 2022-01-20: qty 30, 30d supply, fill #0

## 2022-01-20 NOTE — Consult Note (Signed)
?Consultation Note ?Date: 01/20/2022  ? ?Patient Name: Victoria Gordon  ?DOB: 15-May-1981  MRN: [id number]  Age / Sex: 41 y.o., female  ?PCP: Patient, [name] ?Referring Physician: Damita Lack, MD ? ?Reason for Consultation: Establishing goals of care ? ?HPI/Patient Profile: 41 y.o. female  with past medical history of alcohol related liver cirrhosis, esophageal varices, recurrent pleural effusions, pancytopenia, PTSD, and anxiety admitted on 01/17/2022 with dyspnea.  Questionable medication compliance at home.  Patient had thoracentesis 3/21.  Patient with MELD score of 31.  Not a candidate for aggressive interventions.  PMT consulted to discuss goals of care. ? ?Clinical Assessment and Goals of Care: ?I have reviewed medical records including EPIC notes, labs and imaging,  assessed the patient and then met with patient to discuss diagnosis prognosis, GOC, EOL wishes, disposition and options. ? ?I introduced Palliative Medicine as specialized medical care for people living with serious illness. It focuses on providing relief from the symptoms and stress of a serious illness. The goal is to improve quality of life for both the patient and the family. ? ?Patient tells me she lives with her mother.  She tells me some days she is fully functional but on bad days she spends most of her time in bed.  She tells me half of her days are bad days.  She tells me she consistently has a poor appetite. ? ? We discussed patient's current illness and what it means in the larger context of patient's on-going co-morbidities.  Natural disease trajectory and expectations at EOL were discussed.  We discussed her severe liver disease. ? ?I attempted to elicit values and goals of care important to the patient.   ? ?The difference between aggressive medical intervention and comfort care was considered in light of the patient's goals of care.  ? ?Hospice services  outpatient were explained and offered.  Discussed philosophy of hospice care and type of support provided.  Patient is interested in this type of support and would like to receive hospice care at home. ? ?Encouraged patient to consider DNR/DNI status understanding evidenced based poor outcomes in similar hospitalized patients, as the cause of the arrest is likely associated with chronic/terminal disease rather than a reversible acute cardio-pulmonary event.  Patient seems to understand this and agree that DNR would be appropriate however she does request time to discuss this with her mother before making decision.  We discussed that she could continue these conversations with her outpatient hospice providers. ? ?Confirmed with patient that she would like her mother to make decisions for her if she were ever unable. ? ?Patient tells me she is currently comfortable with no symptom management needs.  We discussed with hospice support they can address any symptom concerns outpatient. ? ?Questions and concerns were addressed. The family was encouraged to call with questions or concerns. ? ?Primary Decision Maker ?PATIENT ?Patient does indicate she would want her mother to make decisions for her if she were ever unable ? ?SUMMARY OF RECOMMENDATIONS   ?Home with hospice support ?DNR recommended however patient would like to discuss with mother first-recommend ongoing discussions with outpatient hospice as she will likely discharge today ?No symptom management needs today ? ?Code Status/Advance Care Planning: ?DNR ? ?Prognosis:  ?< 6 months ? ?Discharge Planning: Home with Hospice  ? ?  ? ?Primary Diagnoses: ?Present on Admission: ? Recurrent right pleural effusion ? Alcoholic cirrhosis of liver ([place]) ? Alcohol use disorder, severe, dependence ([place]) ? Esophageal varices without  bleeding ([place]) ? Hyperbilirubinemia ? Pancytopenia ([place]) ? Chronic hyponatremia ? (Resolved) Pleural effusion ? ? ?I have reviewed the medical  record, interviewed the patient and family, and examined the patient. The following aspects are pertinent. ? ?Past Medical History:  ?Diagnosis Date  ? Alcohol abuse   ? Alcoholic cirrhosis of liver with ascites ([place])   ? Anemia   ? Anxiety   ? Hemorrhoid   ? Hepatitis B   ? Major depressive disorder   ? PTSD (post-traumatic stress disorder)   ? Substance abuse ([place])   ? ?Social History  ? ?Socioeconomic History  ? Marital status: Single  ?  Spouse name: Not on file  ? Number of children: Not on file  ? Years of education: Not on file  ? Highest education level: Not on file  ?Occupational History  ? Not on file  ?Tobacco Use  ? Smoking status: Never  ? Smokeless tobacco: Never  ?Vaping Use  ? Vaping Use: Never used  ?Substance and Sexual Activity  ? Alcohol use: Yes  ?  Comment: 12/03/2021  ? Drug use: No  ? Sexual activity: Yes  ?  Birth control/protection: None  ?Other Topics Concern  ? Not on file  ?Social History Narrative  ? Not on file  ? ?Social Determinants of Health  ? ?Financial Resource Strain: Not on file  ?Food Insecurity: Not on file  ?Transportation Needs: Not on file  ?Physical Activity: Not on file  ?Stress: Not on file  ?Social Connections: Not on file  ? ?Family History  ?Problem Relation Age of Onset  ? Diabetes Maternal Grandmother   ? Diabetes Paternal Grandfather   ? ?Scheduled Meds: ? ciprofloxacin  500 mg Oral Daily  ? folic acid  1 mg Oral Daily  ? furosemide  40 mg Oral Daily  ? lactulose  20 g Oral Daily  ? midodrine  10 mg Oral TID WC  ? pantoprazole  40 mg Oral Daily  ? rifaximin  550 mg Oral BID  ? spironolactone  50 mg Oral Daily  ? ?Continuous Infusions: ?PRN Meds:.guaiFENesin, hydrALAZINE, ipratropium-albuterol, metoprolol tartrate, ondansetron **OR** ondansetron (ZOFRAN) IV, senna-docusate ?Allergies  ?Allergen Reactions  ? Vitamin K And Related Hives, Shortness Of Breath, Itching and Rash  ?  Tolerated on 08/21/21 without reaction  ? Vancomycin Hives and Itching  ? ?Review of  Systems  ?Constitutional:  Positive for activity change and appetite change.  ? ?Physical Exam ?Constitutional:   ?   General: She is not in acute distress. ?Pulmonary:  ?   Effort: Pulmonary effort is normal.  ?Skin: ?   General: Skin is warm and dry.  ?Neurological:  ?   Mental Status: She is alert and oriented to person, place, and time.  ? ? ?Vital Signs: BP 99/60 (BP Location: Left Arm)   Pulse 73   Temp 98.3 ?F (36.8 ?C) (Oral)   Resp 18   Ht '5\' 1"'  (1.549 m)   Wt 83.4 kg   LMP  (LMP Unknown)   SpO2 99%   BMI 34.74 kg/m?  ?Pain Scale: 0-10 ?  ?Pain Score: 0-No pain ? ? ?SpO2: SpO2: 99 % ?O2 Device:SpO2: 99 % ?O2 Flow Rate: .  ? ?IO: Intake/output summary:  ?Intake/Output Summary (Last 24 hours) at 01/20/2022 1432 ?Last data filed at 01/20/2022 0400 ?Gross per 24 hour  ?Intake 120 ml  ?Output --  ?Net 120 ml  ? ? ?LBM: Last BM Date : 01/19/22 ?Baseline Weight: Weight: 81.6 kg ?Most  recent weight: Weight: 83.4 kg     ?Palliative Assessment/Data: PPS 50% ? ? ? ? ?*Please note that this is a verbal dictation therefore any spelling or grammatical errors are due to the "[place]" system interpretation. ? ?Juel Burrow, DNP, AGNP-C ?Palliative Medicine Team ?[phone] ?Pager: [phone] ? ?

## 2022-01-20 NOTE — TOC Progression Note (Addendum)
Transition of Care (TOC) - Progression Note  ? ? ?Patient Details  ?Name: Victoria Gordon ?MRN: [id number] ?Date of Birth: 10/22/1981 ? ?Transition of Care (TOC) CM/SW Contact  ?Marilu Favre, RN ?Phone Number: ?01/20/2022, 10:40 AM ? ?Clinical Narrative:    ? ?Spoke to patient at bedside. Confirmed face sheet information. The home phone is her mother's cell Woody Seller 336 [phone].  ? ?Patient lives with her mother and her five children.  ? ?Discussed home hospice , offered choice , patient has no preference. Called [facility] to follow up. Spoke to [employer] she is reviewing referral and will call NCM back with a determination.  ? ?Planned discharge for today  ? ?No DME needed  ? ?1315 Patient has been accepted by [facility] for home hospice. They will call patient and set up a time to admit her tomorrow. Patient, MD and nurse aware  ? ?Expected Discharge Plan: [place] ?Barriers to Discharge: No Barriers Identified ? ?Expected Discharge Plan and Services ?Expected Discharge Plan: [place] ?  ?Discharge Planning Services: CM Consult ?  ?  ?Expected Discharge Date: 01/20/22               ?  ?  ?  ?  ?  ?  ?  ?  ?  ?  ? ? ?Social Determinants of Health (SDOH) Interventions ?  ? ?Readmission Risk Interventions ?   ? View : No data to display.  ?  ?  ?  ? ? ?

## 2022-01-20 NOTE — Discharge Summary (Signed)
Physician Discharge Summary  ?Victoria Gordon ZSW:[id number] DOB: May 21, 1981 DOA: 01/17/2022 ? ?PCP: Patient, [name] ? ?Admit date: 01/17/2022 ?Discharge date: 01/20/2022 ? ?Admitted From: Home ?Disposition: Home with outpatient hospice ? ?Recommendations for Outpatient Follow-up:  ?Follow up with PCP in 1-2 weeks ?Please obtain BMP/CBC in one week your next doctors visit.  ?Refill for lactulose, Aldactone, Lasix, rifaximin, midodrine has been given.  Advised to continue her outpatient daily Cipro for SBP prophylaxis as previously prescribed by GI ?Outpatient arrangement follow-up with hospital service has been made ? ?Discharge Condition: Stable ?CODE STATUS: Full code ?Diet recommendation: Low-salt diet ? ?Brief/Interim Summary: ?41 year old with history of alcohol-related liver cirrhosis, decompensated cirrhosis-esophageal varices, portal gastropathy, recurrent right-sided pleural effusion, pancytopenia, chronic hyponatremia, PTSD, anxiety, substance abuse disorder came to the ED with complaints of dyspnea.  Patient has ran out of several of her prescriptions and has not been taking her medications.  Upon admission she was noted to be hyponatremic, chest x-ray showing large right-sided pleural effusion.  Underwent thoracentesis by pulmonary on 3/21 showing transudative fluid.  GI consulted, resumed home diuretics.  Palliative care team consulted for goals of care conversation.  Eventually was determined that patient will follow-up outpatient with hospice services.  Appreciate input from GI. ? ?Patient is medically stable for discharge.  Overall she understands she has very poor prognosis and very limited treatment options. ? ? ?Assessment and Plan: ?* Recurrent right pleural effusion ?Status post thoracentesis 3/21 showing transudative fluid.  About 2 L removed.  Stable, no longer short of breath.  Follow-up outpatient ? ?Alcoholic cirrhosis of liver ([place]) ?Esophageal varices without  bleeding ?Portal hypertensive gastropathy ?History of SBP ?History of hepatic encephalopathy ? ?Has history of decompensated liver cirrhosis.  Follows outpatient GI Dr. Candis Schatz.  Has not been taking most of her medications at home.  Home medications have been restarted including-Lasix, Aldactone, rifaximin, lactulose, midodrine.  Refill prescriptions have been given for these medications.  Also seen by palliative care service, outpatient hospice will be arranged. ?Continue daily Cipro for SBP prophylaxis as previously prescribed by GI ? ?Alcohol use disorder, severe, dependence ([place]) ?Reports no alcohol use in 2 months.  Currently on withdrawal protocol. ? ? ?Pancytopenia ([place]) ?Chronic, secondary to cirrhosis.  Continue to monitor. ? ?Chronic hyponatremia ?Chronic from cirrhosis ? ?Esophageal varices without bleeding ([place]) ?No active bleeding ? ?Hyperbilirubinemia ?Chronic and stable, secondary to cirrhosis. ? ? ? ? ?  ?Body mass index is 34.74 kg/m?. ? ?  ? ? ? ?Discharge Diagnoses:  ?Principal Problem: ?  Recurrent right pleural effusion ?Active Problems: ?  Alcoholic cirrhosis of liver ([place]) ?  Alcohol use disorder, severe, dependence ([place]) ?  Pancytopenia ([place]) ?  Hyperbilirubinemia ?  Esophageal varices without bleeding ([place]) ?  Chronic hyponatremia ? ? ? ? ? ?Consultations: ?Pulmonary ?Gastroenterology ?Palliative care service ? ?Subjective: ?Patient feels okay no complaints at this time.  All the questions have been answered and she wants to go home. ? ?Discharge Exam: ?Vitals:  ? 01/20/22 0542 01/20/22 0809  ?BP: (!) 101/59 99/60  ?Pulse: 76 73  ?Resp: 16 18  ?Temp: 98.4 ?F (36.9 ?C) 98.3 ?F (36.8 ?C)  ?SpO2: 100% 99%  ? ?Vitals:  ? 01/19/22 2037 01/20/22 3220 01/20/22 0542 01/20/22 0809  ?BP: 113/62  (!) 101/59 99/60  ?Pulse: 85  76 73  ?Resp: '16  16 18  '$ ?Temp: 98.5 ?F (36.9 ?C)  98.4 ?F (36.9 ?C) 98.3 ?F (36.8 ?C)  ?TempSrc: Oral  Oral Oral  ?  SpO2: 100%  100% 99%  ?Weight:  83.4 kg    ?Height:       ? ? ?General: Pt is alert, awake, not in acute distress ?Cardiovascular: RRR, S1/S2 +, no rubs, no gallops ?Respiratory: CTA bilaterally, no wheezing, no rhonchi ?Abdominal: Abdomen is slightly distended with positive fluid wave shift. ?Extremities: no edema, no cyanosis ?Scleral icterus, jaundice ? ?Discharge Instructions ? ? ?Allergies as of 01/20/2022   ? ?   Reactions  ? Vitamin K And Related Hives, Shortness Of Breath, Itching, Rash  ? Tolerated on 08/21/21 without reaction  ? Vancomycin Hives, Itching  ? ?  ? ?  ?Medication List  ?  ? ?STOP taking these medications   ? ?folic acid 1 MG tablet ?Commonly known as: FOLVITE ?  ?ondansetron 4 MG disintegrating tablet ?Commonly known as: ZOFRAN-ODT ?  ? ?  ? ?TAKE these medications   ? ?ciprofloxacin 500 MG tablet ?Commonly known as: CIPRO ?Take 1 tablet (500 mg total) by mouth daily. ?  ?furosemide 40 MG tablet ?Commonly known as: Lasix ?Take 1 tablet (40 mg total) by mouth daily. ?  ?lactulose 10 GM/15ML solution ?Commonly known as: [place] ?Take 30 mLs (20 g total) by mouth 2 (two) times daily. ?What changed: when to take this ?  ?midodrine 10 MG tablet ?Commonly known as: PROAMATINE ?Take 1 tablet (10 mg total) by mouth 3 (three) times daily with meals. ?  ?pantoprazole 40 MG tablet ?Commonly known as: PROTONIX ?Take 1 tablet (40 mg total) by mouth daily. ?What changed:  ?when to take this ?reasons to take this ?  ?rifaximin 550 MG Tabs tablet ?Commonly known as: XIFAXAN ?Take 1 tablet (550 mg total) by mouth 2 (two) times daily. ?What changed: additional instructions ?  ?spironolactone 50 MG tablet ?Commonly known as: Aldactone ?Take 1 tablet (50 mg total) by mouth daily. ?  ? ?  ? ? Follow-up Information   ? ? [facility] Follow up.   ?Contact information: ?[address] ?[place] [address] ?[phone] ? ? ?  ?  ? ?  ?  ? ?  ? ?Allergies  ?Allergen Reactions  ? Vitamin K And Related Hives, Shortness Of Breath, Itching and Rash  ?  Tolerated  on 08/21/21 without reaction  ? Vancomycin Hives and Itching  ? ? ?You were cared for by a hospitalist during your hospital stay. If you have any questions about your discharge medications or the care you received while you were in the hospital after you are discharged, you can call the unit and asked to speak with the hospitalist on call if the hospitalist that took care of you is not available. Once you are discharged, your primary care physician will handle any further medical issues. Please note that no refills for any discharge medications will be authorized once you are discharged, as it is imperative that you return to your primary care physician (or establish a relationship with a primary care physician if you do not have one) for your aftercare needs so that they can reassess your need for medications and monitor your lab values. ? ? ?Procedures/Studies: ?DG Chest 2 View ? ?Result Date: 01/17/2022 ?CLINICAL DATA:  Dyspnea. Abdominal swelling and shortness of breath. History of ascites. EXAM: CHEST - 2 VIEW COMPARISON:  Radiograph 11/23/2021, CT 11/05/2021 FINDINGS: Progressive right pleural effusion, large in size occupying the lower 2/3 of right hemithorax. Right suprahilar atelectasis peer there is mild leftward shift of the heart. Retrocardiac opacity corresponds  to prominent esophageal varices on prior CT. There is no significant left pleural effusion, the left lung is clear. No pneumothorax or pulmonary edema. IMPRESSION: Progressive right pleural effusion, large in size, occupying the lower 2/3 of right hemithorax. Electronically Signed   By: Keith Rake M.D.   On: 01/17/2022 19:51  ? ?US Abdomen Limited ? ?Result Date: 01/18/2022 ?CLINICAL DATA:  Cirrhosis and ascites.  Cholecystectomy. EXAM: ULTRASOUND ABDOMEN LIMITED RIGHT UPPER QUADRANT COMPARISON:  Ultrasound abdomen 11/06/2021. CT chest abdomen and pelvis 11/05/2021. FINDINGS: Gallbladder: Surgically absent. Common bile duct: Diameter: 8 mm  Liver: No focal lesion identified. Small in size with increased echogenicity and nodular contour. Mild intrahepatic biliary ductal dilatation. Portal vein is patent on color Doppler imaging with normal direction of blo

## 2022-01-20 NOTE — Progress Notes (Signed)
Discharge instructions given at this time. Meds delivered to bedside by pharmacy. Pt transported off unit via [place] with all belongings and prescriptions on self. Pt remains stable at baseline. ?

## 2022-01-20 NOTE — TOC Progression Note (Addendum)
Transition of Care (TOC) - Progression Note  ? ? ?Patient Details  ?Name: Victoria Gordon ?MRN: [id number] ?Date of Birth: 03-05-1981 ? ?Transition of Care (TOC) CM/SW Contact  ?Verdell Carmine, RN ?Phone Number: ?01/20/2022, 9:33 AM ? ?Clinical Narrative:    ? ?Patient to be DC today, MD discussed hospice at home with patent. She has no particular agency, although she lives in [place]. Reached out to [facility], faxed over H&P and facesheet, and left message with Cassandra at [facility] about patient and return call to discuss. She has not changed her code status yet, wanting to discuss with her mother.  ? ? ?Expected Discharge Plan: [place] ?Barriers to Discharge: No Barriers Identified ? ?Expected Discharge Plan and Services ?Expected Discharge Plan: [place] ?  ?Discharge Planning Services: CM Consult ?  ?  ?               Home with Hospice  ?  ?  ?  ?  ?  ?  ?  ?  ?  ?  ? ? ?Social Determinants of Health (SDOH) Interventions ?  ? ?Readmission Risk Interventions ?   ? View : No data to display.  ?  ?  ?  ? ? ?

## 2022-01-21 LAB — BODY FLUID CULTURE W GRAM STAIN: Culture: NO GROWTH

## 2022-02-03 ENCOUNTER — Other Ambulatory Visit (HOSPITAL_COMMUNITY): Payer: Self-pay

## 2022-02-04 ENCOUNTER — Inpatient Hospital Stay
Admission: AD | Admit: 2022-02-04 | Payer: Medicaid Other | Source: Other Acute Inpatient Hospital | Admitting: Internal Medicine

## 2022-02-05 ENCOUNTER — Observation Stay (HOSPITAL_COMMUNITY)
Admission: EM | Admit: 2022-02-05 | Discharge: 2022-02-06 | Disposition: A | Discharge status: Hospice | Payer: Medicaid Other | Attending: Internal Medicine | Admitting: Internal Medicine

## 2022-02-05 ENCOUNTER — Emergency Department (HOSPITAL_COMMUNITY): Payer: Medicaid Other

## 2022-02-05 ENCOUNTER — Other Ambulatory Visit: Payer: Self-pay

## 2022-02-05 ENCOUNTER — Encounter (HOSPITAL_COMMUNITY): Payer: Self-pay | Admitting: Emergency Medicine

## 2022-02-05 DIAGNOSIS — D696 Thrombocytopenia, unspecified: Secondary | ICD-10-CM | POA: Diagnosis not present

## 2022-02-05 DIAGNOSIS — Z515 Encounter for palliative care: Secondary | ICD-10-CM

## 2022-02-05 DIAGNOSIS — E871 Hypo-osmolality and hyponatremia: Secondary | ICD-10-CM

## 2022-02-05 DIAGNOSIS — J90 Pleural effusion, not elsewhere classified: Principal | ICD-10-CM | POA: Diagnosis present

## 2022-02-05 DIAGNOSIS — K922 Gastrointestinal hemorrhage, unspecified: Secondary | ICD-10-CM

## 2022-02-05 DIAGNOSIS — Z79899 Other long term (current) drug therapy: Secondary | ICD-10-CM | POA: Insufficient documentation

## 2022-02-05 DIAGNOSIS — F1020 Alcohol dependence, uncomplicated: Secondary | ICD-10-CM | POA: Diagnosis present

## 2022-02-05 DIAGNOSIS — K7030 Alcoholic cirrhosis of liver without ascites: Secondary | ICD-10-CM | POA: Diagnosis not present

## 2022-02-05 DIAGNOSIS — R0602 Shortness of breath: Secondary | ICD-10-CM | POA: Diagnosis present

## 2022-02-05 DIAGNOSIS — Z66 Do not resuscitate: Secondary | ICD-10-CM | POA: Insufficient documentation

## 2022-02-05 DIAGNOSIS — K7031 Alcoholic cirrhosis of liver with ascites: Secondary | ICD-10-CM

## 2022-02-05 DIAGNOSIS — D649 Anemia, unspecified: Secondary | ICD-10-CM | POA: Diagnosis present

## 2022-02-05 LAB — COMPREHENSIVE METABOLIC PANEL
ALT: 24 U/L (ref 0–44)
AST: 99 U/L — ABNORMAL HIGH (ref 15–41)
Albumin: 2.1 g/dL — ABNORMAL LOW (ref 3.5–5.0)
Alkaline Phosphatase: 121 U/L (ref 38–126)
Anion gap: 7 (ref 5–15)
BUN: 10 mg/dL (ref 6–20)
CO2: 17 mmol/L — ABNORMAL LOW (ref 22–32)
Calcium: 7.2 mg/dL — ABNORMAL LOW (ref 8.9–10.3)
Chloride: 108 mmol/L (ref 98–111)
Creatinine, Ser: 0.68 mg/dL (ref 0.44–1.00)
GFR, Estimated: >60 mL/min (ref >60)
Glucose, Bld: 146 mg/dL — ABNORMAL HIGH (ref 70–99)
Potassium: 3.7 mmol/L (ref 3.5–5.1)
Sodium: 132 mmol/L — ABNORMAL LOW (ref 135–145)
Total Bilirubin: 18.2 mg/dL (ref 0.3–1.2)
Total Protein: 5.8 g/dL — ABNORMAL LOW (ref 6.5–8.1)

## 2022-02-05 LAB — CBC
HCT: 25.9 % — ABNORMAL LOW (ref 36.0–46.0)
Hemoglobin: 8.3 g/dL — ABNORMAL LOW (ref 12.0–15.0)
MCH: 31.6 pg (ref 26.0–34.0)
MCHC: 32 g/dL (ref 30.0–36.0)
MCV: 98.5 fL (ref 80.0–100.0)
Platelets: 53 10*3/uL — ABNORMAL LOW (ref 150–400)
RBC: 2.63 MIL/uL — ABNORMAL LOW (ref 3.87–5.11)
RDW: 17.2 % — ABNORMAL HIGH (ref 11.5–15.5)
WBC: 5.4 10*3/uL (ref 4.0–10.5)
nRBC: 0 % (ref 0.0–0.2)

## 2022-02-05 LAB — I-STAT BETA HCG BLOOD, ED (MC, WL, AP ONLY): I-stat hCG, quantitative: <5 m[IU]/mL (ref <5)

## 2022-02-05 LAB — LIPASE, BLOOD: Lipase: 75 U/L — ABNORMAL HIGH (ref 11–51)

## 2022-02-05 MED ORDER — ACETAMINOPHEN 325 MG PO TABS: 650.0000 mg | ORAL_TABLET | Freq: Four times a day (QID) | ORAL | Status: DC | PRN

## 2022-02-05 MED ORDER — ACETAMINOPHEN 650 MG RE SUPP: 650.0000 mg | Freq: Four times a day (QID) | RECTAL | Status: DC | PRN

## 2022-02-05 MED ORDER — LACTULOSE 10 GM/15ML PO SOLN
20.0000 g | Freq: Two times a day (BID) | ORAL | Status: DC
  Administered 2022-02-05 – 2022-02-06 (×2): 20 g via ORAL
  Filled 2022-02-05 (×2): qty 30

## 2022-02-05 MED ORDER — PANTOPRAZOLE SODIUM 40 MG PO TBEC: 40.0000 mg | DELAYED_RELEASE_TABLET | Freq: Every day | ORAL | Status: DC

## 2022-02-05 MED ORDER — PROSIGHT PO TABS
1.0000 | ORAL_TABLET | Freq: Every day | ORAL | Status: DC
  Filled 2022-02-05: qty 1

## 2022-02-05 MED ORDER — MORPHINE SULFATE (PF) 2 MG/ML IV SOLN
2.0000 mg | INTRAVENOUS | Status: DC | PRN
  Administered 2022-02-06 (×6): 2 mg via INTRAVENOUS
  Filled 2022-02-05 (×6): qty 1

## 2022-02-05 MED ORDER — ONDANSETRON HCL 4 MG PO TABS: 4.0000 mg | ORAL_TABLET | Freq: Four times a day (QID) | ORAL | Status: DC | PRN

## 2022-02-05 MED ORDER — MAGNESIUM HYDROXIDE 400 MG/5ML PO SUSP: 30.0000 mL | Freq: Every day | ORAL | Status: DC | PRN

## 2022-02-05 MED ORDER — THIAMINE HCL 100 MG PO TABS
100.0000 mg | ORAL_TABLET | Freq: Every day | ORAL | Status: DC
Start: 2022-02-06 — End: 2022-02-06

## 2022-02-05 MED ORDER — TRAZODONE HCL 50 MG PO TABS: 25.0000 mg | ORAL_TABLET | Freq: Every evening | ORAL | Status: DC | PRN

## 2022-02-05 MED ORDER — FUROSEMIDE 40 MG PO TABS
40.0000 mg | ORAL_TABLET | Freq: Every day | ORAL | Status: DC
  Administered 2022-02-06: 40 mg via ORAL
  Filled 2022-02-05: qty 1

## 2022-02-05 MED ORDER — SPIRONOLACTONE 25 MG PO TABS
50.0000 mg | ORAL_TABLET | Freq: Every day | ORAL | Status: DC
  Administered 2022-02-06: 50 mg via ORAL
  Filled 2022-02-05: qty 2

## 2022-02-05 MED ORDER — MIDODRINE HCL 5 MG PO TABS
10.0000 mg | ORAL_TABLET | Freq: Three times a day (TID) | ORAL | Status: DC
  Administered 2022-02-06 (×3): 10 mg via ORAL
  Filled 2022-02-05 (×3): qty 2

## 2022-02-05 MED ORDER — CIPROFLOXACIN HCL 500 MG PO TABS
500.0000 mg | ORAL_TABLET | Freq: Every day | ORAL | Status: DC
  Administered 2022-02-06: 500 mg via ORAL
  Filled 2022-02-05: qty 1

## 2022-02-05 MED ORDER — FOLIC ACID 1 MG PO TABS: 1.0000 mg | ORAL_TABLET | Freq: Every day | ORAL | Status: DC

## 2022-02-05 MED ORDER — RIFAXIMIN 550 MG PO TABS
550.0000 mg | ORAL_TABLET | Freq: Two times a day (BID) | ORAL | Status: DC
  Administered 2022-02-05 – 2022-02-06 (×2): 550 mg via ORAL
  Filled 2022-02-05 (×2): qty 1

## 2022-02-05 MED ORDER — ONDANSETRON HCL 4 MG/2ML IJ SOLN
4.0000 mg | Freq: Four times a day (QID) | INTRAMUSCULAR | Status: DC | PRN
  Administered 2022-02-06: 4 mg via INTRAVENOUS
  Filled 2022-02-05: qty 2

## 2022-02-05 NOTE — H&P (Signed)
?  ?  ? ? ? ?PATIENT NAME: Victoria Gordon   ? ?MR#:  [id number] ? ?DATE OF BIRTH:  May 25, 1981 ? ?DATE OF ADMISSION:  02/05/2022 ? ?PRIMARY CARE PHYSICIAN: Patient, [name]  ? ?Patient is coming from: Home ? ?REQUESTING/REFERRING PHYSICIAN: Lennice Sites, DO  ? ?CHIEF COMPLAINT:  ? ?Chief Complaint  ?Patient presents with  ?? Shortness of Breath  ? ? ?HISTORY OF PRESENT ILLNESS:  ?Victoria Gordon is a 41 y.o. 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  She admitted to chills without fever.  She has been having paroxysmal nocturnal dyspnea but denied any orthopnea.  She had nausea and vomiting earlier today.  She admits to chest tightness without radiation or diaphoresis or palpitations.  No dysuria, oliguria or hematuria or flank pain.  She has occasional black stools. ? ?She was seen at [facility] ED last night for vomiting and dyspnea and was accepted for transfer to here as a direct admission however she decided to leave AMA and came directly to the ER here.  She was recently admitted to [facility] from 3/20 until 3/23 due to recurrence of her right sided pleural effusion.  She underwent a right-sided thoracentesis removing 2 L.  She was discharged home with home-going hospice given her advanced cirrhosis and poor overall prognosis and not being a candidate for liver transplant.  She has been binge drinking for the last couple days.  Always 2.31 they are down from 4 on 3/22 and platelets were 60.  Chest x-ray done showed near complete opacification of the right lung with right  pleural effusion and radiology with suspected effusion could be loculated.  She was thought to possibly have aspiration pneumonia and was started on IV Unasyn.  Her family wanted her to to be admitted and hospitalized for further management.  She admitted to continuing drinking alcohol helps with her stress. ? ?ED Course: Upon presentation to the emergency room, respiratory rate was 25 with otherwise normal vital signs.  Later heart rate was 102.  Labs revealed a anemia with hemoglobin 8.3 and hematocrit 25.9 better than previous values and platelets were 53 compared to 27 on 01/18/2022.  Urine pregnancy test was negative.  Blood glucose was 146.  Sodium was 132 with CO2 of 17 and calcium 7.2.  Albumin was 2.1 with total protein of 5.8.  AST was 99 and ALT 24 with alk phos of 121 and total bili 18.2 compared to 12.7 on 01/20/2022.  Coagulation profile is currently pending.  EKG as reviewed by me : EKG showed normal sinus rhythm with a rate of 94 with left axis deviation and poor R wave progression with low voltage QRS ?Imaging: Portable chest ray showed small to moderate volume right pleural effusion with right lung airspace opacity that could represent a combination of atelectasis versus infection/inflammation. ? ?Contact was made with Dr. Benson Norway who will be willing to see the patient.  The patient wanted to temporarily decline hospice while hospitalized but continue on it on outpatient basis. ?PAST MEDICAL HISTORY:  ? ?Past Medical History:  ?Diagnosis Date  ?? Alcohol abuse   ?? Alcoholic cirrhosis of liver with ascites ([place])   ??  Anemia   ?? Anxiety   ?? Hemorrhoid   ?? Hepatitis B   ?? Major depressive disorder   ?? PTSD (post-traumatic stress disorder)   ?? Substance abuse ([place])   ? ? ?PAST SURGICAL HISTORY:  ? ?Past Surgical History:  ?Procedure Laterality Date  ?? BIOPSY  08/29/2021  ? Procedure: BIOPSY;  Surgeon: Daryel November, MD;  Location: [facility];  Service: Gastroenterology;;  ?? CESAREAN SECTION     ?? CHOLECYSTECTOMY    ?? COLONOSCOPY WITH PROPOFOL N/A 08/29/2021  ? Procedure: COLONOSCOPY WITH PROPOFOL;  Surgeon: Daryel November, MD;  Location: [facility];  Service: Gastroenterology;  Laterality: N/A;  ?? ESOPHAGOGASTRODUODENOSCOPY (EGD) WITH PROPOFOL N/A 08/29/2021  ? Procedure: ESOPHAGOGASTRODUODENOSCOPY (EGD) WITH PROPOFOL;  Surgeon: Daryel November, MD;  Location: [facility];  Service: Gastroenterology;  Laterality: N/A;  ?? IR THORACENTESIS ASP PLEURAL SPACE W/IMG GUIDE  11/23/2021  ?? MOUTH SURGERY    ?? TUBAL LIGATION Bilateral 07/22/2014  ? Procedure: POST PARTUM TUBAL LIGATION;  Surgeon: Woodroe Mode, MD;  Location: [facility];  Service: Gynecology;  Laterality: Bilateral;  ? ? ?SOCIAL HISTORY:  ? ?Social History  ? ?Tobacco Use  ?? Smoking status: Never  ?? Smokeless tobacco: Never  ?Substance Use Topics  ?? Alcohol use: Yes  ?  Comment: 12/03/2021  ? ? ?FAMILY HISTORY:  ? ?Family History  ?Problem Relation Age of Onset  ?? Diabetes Maternal Grandmother   ?? Diabetes Paternal Grandfather   ? ? ?DRUG ALLERGIES:  ? ?Allergies  ?Allergen Reactions  ?? Vitamin K And Related Hives, Shortness Of Breath, Itching and Rash  ?  Tolerated on 08/21/21 without reaction  ?? Vancomycin Hives and Itching  ? ? ?REVIEW OF SYSTEMS:  ? ?ROS ?As per history of present illness. All pertinent systems were reviewed above. Constitutional, HEENT, cardiovascular, respiratory, GI, GU, musculoskeletal, neuro, psychiatric, endocrine, integumentary and hematologic systems were reviewed and are otherwise negative/unremarkable except for positive findings mentioned above in the HPI. ? ? ?MEDICATIONS AT HOME:  ? ?Prior to Admission medications   ?Medication Sig Start Date End Date Taking? Authorizing Provider  ?ciprofloxacin (CIPRO) 500 MG tablet Take 1 tablet (500 mg total) by mouth daily. 01/20/22   Amin, Jeanella Flattery, MD  ?furosemide (LASIX) 40 MG tablet Take 1 tablet (40 mg total) by mouth daily. 01/20/22 02/19/22  Amin,  Jeanella Flattery, MD  ?lactulose, encephalopathy, (CHRONULAC) 10 GM/15ML SOLN Take 30 mLs (20 g total) by mouth 2 (two) times daily. 01/20/22 02/21/22  Damita Lack, MD  ?midodrine (PROAMATINE) 10 MG tablet Take 1 tablet (10 mg total) by mouth 3 (three) times daily with meals. 01/20/22 02/19/22  Amin, Jeanella Flattery, MD  ?pantoprazole (PROTONIX) 40 MG tablet Take 1 tablet (40 mg total) by mouth daily. 01/20/22 02/19/22  Damita Lack, MD  ?rifaximin (XIFAXAN) 550 MG TABS tablet Take 1 tablet (550 mg total) by mouth 2 (two) times daily. 01/20/22 02/19/22  Damita Lack, MD  ?spironolactone (ALDACTONE) 50 MG tablet Take 1 tablet (50 mg total) by mouth daily. 01/20/22 02/19/22  Damita Lack, MD  ? ?  ? ?VITAL SIGNS:  ?Blood pressure 123/74, pulse 88, temperature 98.7 ?F (37.1 ?C), temperature source Oral, resp. rate (!) 25, height $RemoveBe'5\' 1"'mDXgQRQtE$  (1.549 m), weight 86.2 kg, SpO2 97 %. ? ?PHYSICAL EXAMINATION:  ?Physical Exam ? ?GENERAL:  41 y.o.-year-old patient lying in the bed with no acute distress.  ?EYES: Pupils equal, round, reactive to light and accommodation.  Remarkable scleral  icterus.  Extraocular muscles intact.  ?HEENT: Head atraumatic, normocephalic. Oropharynx and nasopharynx clear.  ?NECK:  Supple, no jugular venous distention. No thyroid enlargement, no tenderness.  ?LUNGS: Normal breath sounds bilaterally, no wheezing, rales,rhonchi or crepitation. No use of accessory muscles of respiration.  ?CARDIOVASCULAR: Regular rate and rhythm, S1, S2 normal. No murmurs, rubs, or gallops.  ?ABDOMEN: Soft, distended with positive shifting dullness, and mild right-sided upper quadrant tenderness.  No rebound tenderness guarding or rigidity.  Bowel sounds present. No organomegaly or mass could be palpated from ascites.  ?EXTREMITIES: Trace to 1+ bilateral lower extremity pitting  edema, with no cyanosis, or clubbing.  ?NEUROLOGIC: Cranial nerves II through XII are intact. Muscle strength 5/5 in all extremities.  Sensation intact. Gait not checked.  ?PSYCHIATRIC: The patient is alert and oriented x 3.  Normal affect and good eye contact. ?SKIN: No obvious rash, lesion, or ulcer.  ? ?LABORATORY PANEL:  ? ?CBC ?Recent Lab

## 2022-02-05 NOTE — Assessment & Plan Note (Signed)
-   She has been binge drinking for the last couple of days. ?- She will be placed on CIWA protocol. ?-Folic acid and multivitamins and thiamine will be provided. ?

## 2022-02-05 NOTE — ED Notes (Signed)
ED Provider at bedside. 

## 2022-02-05 NOTE — Assessment & Plan Note (Signed)
-   The patient will be admitted to an observation medical telemetry bed. ?- IR consult to be obtained for IR ultrasound guided right thoracentesis. ?- Thoracentesis labs were ordered. ?

## 2022-02-05 NOTE — ED Notes (Signed)
Pt's sister in law Victoria Gordon can be reached for any further information and would like to be contacted for updates. [phone]. ?

## 2022-02-05 NOTE — Assessment & Plan Note (Signed)
-   This is currently better. ?

## 2022-02-05 NOTE — Assessment & Plan Note (Signed)
-   This is currently better and has been stable. ?

## 2022-02-05 NOTE — ED Triage Notes (Signed)
Pt c/o abdominal pain and SHOB x 2 days. Pt is hospice pt.  ?

## 2022-02-05 NOTE — Assessment & Plan Note (Signed)
MELD 3.0 score is 35 on admission which correlates to 46.1% 90-day survival estimate.  She is determined not to be a transplant candidate by [facility] transplant team. ?-Restart Lasix 40 mg daily and spironolactone 50 mg daily ?-Continue rifaximin 550 mg twice daily ?-Continue lactulose daily, titrate to 2-3 bowel movements per day ?-Continue ciprofloxacin for SBP prophylaxis ?-Continue midodrine 10 mg 3 times daily ?

## 2022-02-05 NOTE — ED Notes (Signed)
X-ray at bedside

## 2022-02-05 NOTE — Assessment & Plan Note (Signed)
-   This is due to her alcohol liver cirrhosis and is currently worsening. ?- We will follow LFTs. ?

## 2022-02-05 NOTE — ED Notes (Signed)
Admitting at bedside 

## 2022-02-05 NOTE — ED Provider Notes (Addendum)
?[facility] ?Provider Note ? ? ?CSN: [id number] ?Arrival date & time: 02/05/22  1915 ? ?  ? ?History ? ?Chief Complaint  ?Patient presents with  ? Shortness of Breath  ? ? ?Victoria Gordon is a 41 y.o. female. ? ?The history is provided by the patient.  ?Shortness of Breath ?Severity:  Moderate ?Onset quality:  Gradual ?Timing:  Constant ?Progression:  Worsening ?Chronicity:  Recurrent ?Context comment:  Patient with history of alcoholic cirrhosis with history of SBP, ascites, recurrent pleural effusions.  Currently being arranged for hospice care. ?Relieved by:  Nothing ?Worsened by:  Exertion ?Associated symptoms: abdominal pain   ?Associated symptoms: no chest pain, no claudication, no cough, no diaphoresis, no ear pain, no fever, no headaches, no hemoptysis, no neck pain, no rash, no sore throat, no sputum production, no syncope, no swollen glands, no vomiting and no wheezing   ? ?  ? ?Home Medications ?Prior to Admission medications   ?Medication Sig Start Date End Date Taking? Authorizing Provider  ?ciprofloxacin (CIPRO) 500 MG tablet Take 1 tablet (500 mg total) by mouth daily. 01/20/22   Amin, Jeanella Flattery, MD  ?furosemide (LASIX) 40 MG tablet Take 1 tablet (40 mg total) by mouth daily. 01/20/22 02/19/22  Amin, Jeanella Flattery, MD  ?lactulose, encephalopathy, (CHRONULAC) 10 GM/15ML SOLN Take 30 mLs (20 g total) by mouth 2 (two) times daily. 01/20/22 02/21/22  Damita Lack, MD  ?midodrine (PROAMATINE) 10 MG tablet Take 1 tablet (10 mg total) by mouth 3 (three) times daily with meals. 01/20/22 02/19/22  Amin, Jeanella Flattery, MD  ?pantoprazole (PROTONIX) 40 MG tablet Take 1 tablet (40 mg total) by mouth daily. 01/20/22 02/19/22  Damita Lack, MD  ?rifaximin (XIFAXAN) 550 MG TABS tablet Take 1 tablet (550 mg total) by mouth 2 (two) times daily. 01/20/22 02/19/22  Damita Lack, MD  ?spironolactone (ALDACTONE) 50 MG tablet Take 1 tablet (50 mg total) by mouth daily.  01/20/22 02/19/22  Damita Lack, MD  ?   ? ?Allergies    ?Vitamin k and related and Vancomycin   ? ?Review of Systems   ?Review of Systems  ?Constitutional:  Negative for diaphoresis and fever.  ?HENT:  Negative for ear pain and sore throat.   ?Respiratory:  Positive for shortness of breath. Negative for cough, hemoptysis, sputum production and wheezing.   ?Cardiovascular:  Negative for chest pain, claudication and syncope.  ?Gastrointestinal:  Positive for abdominal pain. Negative for abdominal distention and vomiting.  ?Musculoskeletal:  Negative for neck pain.  ?Skin:  Negative for rash.  ?Neurological:  Negative for headaches.  ? ?Physical Exam ?Updated Vital Signs ?BP 120/76   Pulse 92   Temp 98.7 ?F (37.1 ?C) (Oral)   Resp (!) 25   Ht '5\' 1"'$  (1.549 m)   Wt 86.2 kg   SpO2 97%   BMI 35.90 kg/m?  ?Physical Exam ?Vitals and nursing note reviewed.  ?Constitutional:   ?   General: She is not in acute distress. ?   Appearance: She is well-developed.  ?HENT:  ?   Head: Normocephalic and atraumatic.  ?Eyes:  ?   Extraocular Movements: Extraocular movements intact.  ?   Conjunctiva/sclera: Conjunctivae normal.  ?   Pupils: Pupils are equal, round, and reactive to light.  ?Cardiovascular:  ?   Rate and Rhythm: Normal rate and regular rhythm.  ?   Pulses: Normal pulses.  ?   Heart sounds: Normal heart sounds. No murmur heard. ?Pulmonary:  ?  Effort: Pulmonary effort is normal. No respiratory distress.  ?   Breath sounds: Examination of the right-upper field reveals decreased breath sounds. Examination of the right-middle field reveals decreased breath sounds. Examination of the right-lower field reveals decreased breath sounds. Decreased breath sounds present.  ?Abdominal:  ?   Palpations: Abdomen is soft.  ?   Tenderness: There is no abdominal tenderness.  ?Musculoskeletal:     ?   General: No swelling.  ?   Cervical back: Normal range of motion and neck supple.  ?   Right lower leg: Edema present.  ?   Left  lower leg: Edema present.  ?Skin: ?   General: Skin is warm and dry.  ?   Capillary Refill: Capillary refill takes less than 2 seconds.  ?Neurological:  ?   General: No focal deficit present.  ?   Mental Status: She is alert.  ?Psychiatric:     ?   Mood and Affect: Mood normal.  ? ? ?ED Results / Procedures / Treatments   ?Labs ?(all labs ordered are listed, but only abnormal results are displayed) ?Labs Reviewed  ?LIPASE, BLOOD - Abnormal; Notable for the following components:  ?    Result Value  ? Lipase 75 (*)   ? All other components within normal limits  ?COMPREHENSIVE METABOLIC PANEL - Abnormal; Notable for the following components:  ? Sodium 132 (*)   ? CO2 17 (*)   ? Glucose, Bld 146 (*)   ? Calcium 7.2 (*)   ? Total Protein 5.8 (*)   ? Albumin 2.1 (*)   ? AST 99 (*)   ? Total Bilirubin 18.2 (*)   ? All other components within normal limits  ?CBC - Abnormal; Notable for the following components:  ? RBC 2.63 (*)   ? Hemoglobin 8.3 (*)   ? HCT 25.9 (*)   ? RDW 17.2 (*)   ? Platelets 53 (*)   ? All other components within normal limits  ?URINALYSIS, ROUTINE W REFLEX MICROSCOPIC  ?PROTIME-INR  ?I-STAT BETA HCG BLOOD, ED ([facility], [facility], [facility] ONLY)  ? ? ?EKG ?EKG Interpretation ? ?Date/Time:  Saturday February 05 2022 19:31:31 EDT ?Ventricular Rate:  94 ?PR Interval:  156 ?QRS Duration: 96 ?QT Interval:  384 ?QTC Calculation: 480 ?R Axis:   -32 ?Text Interpretation: Normal sinus rhythm Left axis deviation Low voltage QRS Cannot rule out Anterior infarct , age undetermined Abnormal ECG When compared with ECG of 17-Jan-2022 09:55, PREVIOUS ECG IS PRESENT Confirmed by Lennice Sites [phone]) on 02/05/2022 8:09:33 PM ? ?Radiology ?DG Chest Portable 1 View ? ?Result Date: 02/05/2022 ?CLINICAL DATA:  sob EXAM: PORTABLE CHEST 1 VIEW.  Patient is rotated. COMPARISON:  Chest x-ray 01/18/2022, chest x-ray 01/17/2022, CT chest 11/05/2021 FINDINGS: The heart and mediastinal contours are unchanged. Right lung airspace opacity. No pulmonary  edema. Small to moderate volume right pleural effusion. No pneumothorax. No acute osseous abnormality. IMPRESSION: 1.  Small to moderate volume right pleural effusion. 2. Right lung airspace opacity that could represent a combination of atelectasis versus infection/inflammation. Electronically Signed   By: Iven Finn M.D.   On: 02/05/2022 21:02   ? ?Procedures ?Procedures  ? ? ?Medications Ordered in ED ?Medications - No data to display ? ?ED Course/ Medical Decision Making/ A&P ?  ?                        ?Medical Decision Making ?Amount and/or Complexity of Data Reviewed ?Labs:  ordered. ?Radiology: ordered. ? ?Risk ?Decision regarding hospitalization. ? ? ?Doroteo Bradford Avila Albritton is here with shortness of breath.  History of alcoholic cirrhosis with ascites.  Ongoing use of alcohol.  Has had recurrent pleural effusions from this.  States that she was at the [facility] emergency department and left AMA to be brought here for admission.  She was supposed to be a direct admit from there but did not want to wait after talking with the ED staff there.  She was told that she had an effusion again and may be a pneumonia.  Bedside ultrasound per myself does not show any large amount of ascites.  Chest x-ray per my review and interpretation does show recurrent right-sided moderate pleural effusion.  She is got some tachypnea but she is not hypoxic.  She is currently being followed with hospice but they have not done their initial home assessment.  She does not have really a long-term goals of care plan.  She thinks that she still wants to pursue thoracentesis and paracentesis is for comfort.  Her lab work today per my review and interpretation shows slightly worsening bilirubin at 18.2.  Liver enzymes are otherwise unremarkable.  Hemoglobin is 8.3.  Platelets are 53.  Overall we will admit the patient for further care.  She will need thoracentesis and may be some longer-term of care goals.  Possibly have outpatient  paracentesis and thoracentesis arranged if needed to help avoid recurrent ED visits.  At this time no concern for infectious process. ? ?This chart was dictated using voice recognition software.  Despite best efforts to proofre

## 2022-02-06 ENCOUNTER — Observation Stay (HOSPITAL_COMMUNITY): Payer: Medicaid Other

## 2022-02-06 DIAGNOSIS — Z7189 Other specified counseling: Secondary | ICD-10-CM

## 2022-02-06 DIAGNOSIS — Z515 Encounter for palliative care: Secondary | ICD-10-CM | POA: Diagnosis not present

## 2022-02-06 DIAGNOSIS — J90 Pleural effusion, not elsewhere classified: Secondary | ICD-10-CM | POA: Diagnosis not present

## 2022-02-06 DIAGNOSIS — K922 Gastrointestinal hemorrhage, unspecified: Secondary | ICD-10-CM

## 2022-02-06 LAB — CBC
HCT: 20.9 % — ABNORMAL LOW (ref 36.0–46.0)
Hemoglobin: 7 g/dL — ABNORMAL LOW (ref 12.0–15.0)
MCH: 32.3 pg (ref 26.0–34.0)
MCHC: 33.5 g/dL (ref 30.0–36.0)
MCV: 96.3 fL (ref 80.0–100.0)
Platelets: 37 10*3/uL — ABNORMAL LOW (ref 150–400)
RBC: 2.17 MIL/uL — ABNORMAL LOW (ref 3.87–5.11)
RDW: 17.6 % — ABNORMAL HIGH (ref 11.5–15.5)
WBC: 3 10*3/uL — ABNORMAL LOW (ref 4.0–10.5)
nRBC: 0 % (ref 0.0–0.2)

## 2022-02-06 LAB — LACTATE DEHYDROGENASE, PLEURAL OR PERITONEAL FLUID: LD, Fluid: 37 U/L — ABNORMAL HIGH (ref 3–23)

## 2022-02-06 LAB — GRAM STAIN: Gram Stain: NONE SEEN

## 2022-02-06 LAB — BODY FLUID CELL COUNT WITH DIFFERENTIAL
Eos, Fluid: 1 %
Lymphs, Fluid: 66 %
Monocyte-Macrophage-Serous Fluid: 20 % — ABNORMAL LOW (ref 50–90)
Neutrophil Count, Fluid: 13 % (ref 0–25)
Total Nucleated Cell Count, Fluid: 123 cu mm (ref 0–1000)

## 2022-02-06 LAB — PROTIME-INR
INR: 3.3 — ABNORMAL HIGH (ref 0.8–1.2)
Prothrombin Time: 33 seconds — ABNORMAL HIGH (ref 11.4–15.2)

## 2022-02-06 LAB — TROPONIN I (HIGH SENSITIVITY)
Troponin I (High Sensitivity): 7 ng/L (ref <18)
Troponin I (High Sensitivity): 7 ng/L (ref <18)

## 2022-02-06 LAB — HEMOGLOBIN AND HEMATOCRIT, BLOOD
HCT: 22.5 % — ABNORMAL LOW (ref 36.0–46.0)
Hemoglobin: 7.3 g/dL — ABNORMAL LOW (ref 12.0–15.0)

## 2022-02-06 LAB — COMPREHENSIVE METABOLIC PANEL
ALT: 23 U/L (ref 0–44)
AST: 96 U/L — ABNORMAL HIGH (ref 15–41)
Albumin: 1.9 g/dL — ABNORMAL LOW (ref 3.5–5.0)
Alkaline Phosphatase: 104 U/L (ref 38–126)
Anion gap: 4 — ABNORMAL LOW (ref 5–15)
BUN: 6 mg/dL (ref 6–20)
CO2: 19 mmol/L — ABNORMAL LOW (ref 22–32)
Calcium: 7.2 mg/dL — ABNORMAL LOW (ref 8.9–10.3)
Chloride: 112 mmol/L — ABNORMAL HIGH (ref 98–111)
Creatinine, Ser: 0.56 mg/dL (ref 0.44–1.00)
GFR, Estimated: >60 mL/min (ref >60)
Glucose, Bld: 91 mg/dL (ref 70–99)
Potassium: 3.5 mmol/L (ref 3.5–5.1)
Sodium: 135 mmol/L (ref 135–145)
Total Bilirubin: 15.9 mg/dL — ABNORMAL HIGH (ref 0.3–1.2)
Total Protein: 5 g/dL — ABNORMAL LOW (ref 6.5–8.1)

## 2022-02-06 LAB — GLUCOSE, PLEURAL OR PERITONEAL FLUID: Glucose, Fluid: 100 mg/dL

## 2022-02-06 LAB — PROTEIN, PLEURAL OR PERITONEAL FLUID: Total protein, fluid: <3 g/dL

## 2022-02-06 MED ORDER — TRAZODONE HCL 50 MG PO TABS: 25.0000 mg | ORAL_TABLET | Freq: Every evening | ORAL | 0 refills | Status: DC | PRN

## 2022-02-06 MED ORDER — LORAZEPAM 2 MG/ML IJ SOLN: 0.5000 mg | INTRAMUSCULAR | Status: DC | PRN

## 2022-02-06 MED ORDER — ONDANSETRON HCL 4 MG PO TABS
4.0000 mg | ORAL_TABLET | Freq: Four times a day (QID) | ORAL | 0 refills | Status: AC | PRN
Start: 2022-02-06 — End: ?

## 2022-02-06 MED ORDER — MORPHINE SULFATE 10 MG/5ML PO SOLN
5.0000 mg | Freq: Three times a day (TID) | ORAL | Status: DC
  Administered 2022-02-06: 5 mg via ORAL
  Filled 2022-02-06: qty 5

## 2022-02-06 MED ORDER — DIAZEPAM 2 MG PO TABS: 2.0000 mg | ORAL_TABLET | Freq: Three times a day (TID) | ORAL | 0 refills | Status: DC | PRN

## 2022-02-06 MED ORDER — MORPHINE SULFATE (PF) 2 MG/ML IV SOLN
2.0000 mg | INTRAVENOUS | Status: DC | PRN
  Administered 2022-02-06 (×2): 2 mg via INTRAVENOUS
  Filled 2022-02-06 (×2): qty 1

## 2022-02-06 MED ORDER — MORPHINE SULFATE 10 MG/5ML PO SOLN
5.0000 mg | Freq: Four times a day (QID) | ORAL | 0 refills | Status: AC | PRN
Start: 2022-02-06 — End: 2022-02-09

## 2022-02-06 MED ORDER — LIDOCAINE HCL (PF) 1 % IJ SOLN
INTRAMUSCULAR | Status: AC
  Filled 2022-02-06: qty 30

## 2022-02-06 MED ORDER — PANTOPRAZOLE SODIUM 40 MG PO TBEC
40.0000 mg | DELAYED_RELEASE_TABLET | Freq: Two times a day (BID) | ORAL | Status: DC
  Administered 2022-02-06: 40 mg via ORAL
  Filled 2022-02-06: qty 1

## 2022-02-06 NOTE — Consult Note (Signed)
?Palliative Medicine Inpatient Consult Note ? ?Consulting Provider: Little Ishikawa, MD ? ?Reason for consult:   ?Palliative Care Consult Services Symptom Management Consult  ?Reason for Consult? Was discharged on hospice last month  ? ?HPI:  ?Per intake H&P --> Victoria Gordon is a 41 y.o. 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  ? ?Psalms met with Palliative care on 3/23  at which time the goals were for her to go home with hospice care. ? ?Upon this admission she came in with worsening symptom burden - dyspnea. Patient has undergone a thoracentesis with improvement in symptoms. ? ?Clinical Assessment/Goals of Care: ? ?*Please note that this is a verbal dictation therefore any spelling or grammatical errors are due to the "[employer]" system interpretation. ? ?I have reviewed medical records including EPIC notes, labs and imaging, received report from bedside RN, assessed the patient who is lying in bed in NAD.  ?  ?I met with Victoria Gordon, her two brothers, two sisters, mother, and two sister in laws to further discuss diagnosis prognosis, GOC, EOL wishes, disposition and options. ?  ?I introduced Palliative Medicine as specialized medical care for people living with serious illness. It focuses on providing relief from the symptoms and stress of a serious illness. The goal is to improve quality of life for both the patient and the family. ? ?Medical History Review and Understanding: ? ?Victoria Gordon and I reviewed the serious nature of her liver disease due to her alcohol abuse. I shared that this is  not something which we can cure and absolutely will take her life within a relatively short period of time. She is tearful but shared understanding of this.  ? ?Social History: ? ?Victoria Gordon lives in [place], [place]. She is not married. She is a mother to five children. She is presently not working. She has no formal religious affiliations.  ? ?Functional and Nutritional State: ? ?Victoria Gordon lives in a single family home with her mother. She has some degree of functionality though is much of the time in bed.  ? ?Victoria Gordon has a fair appetite.  ? ?Palliative Symptoms: ? ?Pain - Constant reviewed the importance of taking medications when needed. Placed on standing morphine as well. ? ?Advance Directives: ? ?A detailed discussion was had today regarding advanced directives.  There are none on file though Victoria Gordon would defer to her mother,  ? ?Code Status: ? ?Encouraged patient/family to consider DNR/DNI status understanding evidenced based poor outcomes in similar hospitalized patient, as the cause of arrest is likely associated with advanced chronic/terminal illness rather than an easily reversible acute cardio-pulmonary event. I explained that DNR/DNI does not change the medical plan and it only comes into effect after a person has arrested (died).  It is a protective measure to keep [place] from harming the patient in their last moments of life. Victoria Gordon was agreeable to DNR/DNI with understanding that patient would not receive CPR, defibrillation, ACLS medications, or intubation.  ? ?Discussion: ? ?Dr. Avon Gully reviewed with Victoria Gordon's family the concern in terms of her noncompliance. He shared the differences between aggressive modalities of care and comfort. Reviewed the different placement options in terms of home with hospice versus a hospice  home. He shared patient very poor overall prognosis which family was aware of. ? ?I spoke to [place] thereafter, she is aware of her disease process. She knows she won;t live long and makes it a  point to share that she would wish to be at home with her children during her final time on earth. She has made the decision to shift gears from full aggressive care to comfort care, allowing treatment of burdening symptoms only.  ? ?Discussed the importance of continued conversation with family and their  medical providers regarding overall plan of care and treatment options, ensuring decisions are within the context of the patients values and GOCs. ? ?Decision Maker: ?Woody Seller (mother): [phone] ? ?SUMMARY OF RECOMMENDATIONS   ?DNAR/DNI ? ?Goals are for comfort ? ?Reviewed that Victoria Gordon will have limited time on this earth in the setting of her end stage cirrhosis ? ?Discussed differences between Hospice care at home and in a Facility ? ?Plan for patient to go home with her mother, her sister in law will spend the nights with her ? ?TOC - Discharge home with [facility], have requested patient gets a new nurse as her formed one was not attentive to her needs ? ?Ongoing Support ? ?Code Status/Advance Care Planning: ?DNAR/DNI ? ?Palliative Prophylaxis:  ?Aspiration, Bowel Regimen, Delirium Protocol, Frequent Pain Assessment, Oral Care, Palliative Wound Care, and Turn Reposition ? ?Additional Recommendations (Limitations, Scope, Preferences): ?Comfort focused care ? ?Psycho-social/Spiritual:  ?Desire for further Chaplaincy support: No ?Additional Recommendations: Education on end of life ?  ?Prognosis: Limited weeks to months. If noncompliant will be weeks. ? ?Discharge Planning: Discharge to home with hospice care. ? ?Vitals:  ? 02/06/22 1037 02/06/22 1105  ?BP: 124/83 106/74  ?Pulse:    ?Resp:    ?Temp:    ?SpO2:    ? ?No intake or output data in the 24 hours ending 02/06/22 1241 ?Last Weight  Most recent update: 02/05/2022  7:26 PM  ? ? Weight  ?86.2 kg (190 lb)  ?      ? ?  ? ?Gen:  Middle aged Hispanic F in NAD ?HEENT: moist mucous membranes ?CV: Regular rate and rhythm ?PULM: On RA in  NAD ?[id number] ?EXT: No edema ?Neuro: Alert and oriented x3 ? ?PPS: 40% ? ? ?This conversation/these recommendations were discussed with patient primary care team, Dr. Avon Gully ? ?[facility] ?_________ ?Tacey Ruiz ?[place] Team ?Team Cell Phone: [phone] ?Please utilize secure chat with additional questions, if there is no response within 30 minutes please call the above phone number ? ?Palliative Medicine Team providers are available by phone from 7am to 7pm daily and can be reached through the team cell phone.  ?Should this patient require assistance outside of these hours, please call the patient's attending physician. ? ? ?

## 2022-02-06 NOTE — Procedures (Signed)
PROCEDURE SUMMARY: ? ?Successful image-guided right thoracentesis. ?Yielded 2.6 L of hazy amber fluid. ?Pt tolerated procedure well. ?No immediate complications. ?EBL = trace  ? ?Specimen was sent for labs. ?CXR ordered. ? ?Please see imaging section of Epic for full dictation. ? ?Tera Mater PA-C ?02/06/2022 ?11:08 AM ? ? ? ?

## 2022-02-06 NOTE — Consult Note (Signed)
CONSULT NOTE FOR Victoria Gordon ? ?Reason for Consult: ETOH cirrhosis and hepatic hydrothorax ?Referring Physician: [facility] ? ?[name] ?HPI: This is a 40 year old female with a PMH of ETOH cirrhosis, continued ETOH abuse and disqualified for transplantation, large esophageal varices, substance abuse, and right pleural effusion admitted with complaints of SOB.  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  During that admission 2 liters of fluid were removed.  She was last evaluated by Dr. Candis Schatz in the office on 12/21/2021 and she reported being compliant with her furosemide 20 mg QD and spironolactone 50 mg QD.  At that time she also claimed that she was monitoring her salt intake.  The patient was recommended to enroll in hospice.  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  2.6 liters of hazy amber fluid were removed. ? ?Past Medical History:  ?Diagnosis Date  ? Alcohol abuse   ? Alcoholic cirrhosis of liver with ascites ([place])   ? Anemia   ? Anxiety   ? Hemorrhoid   ? Hepatitis B   ? Major depressive disorder   ? PTSD (post-traumatic stress disorder)   ? Substance abuse ([place])   ? ? ?Past Surgical History:  ?Procedure Laterality Date  ? BIOPSY  08/29/2021  ? Procedure: BIOPSY;  Surgeon: Daryel November, MD;  Location: [facility];  Service: Gastroenterology;;  ? CESAREAN SECTION    ? CHOLECYSTECTOMY    ? COLONOSCOPY WITH PROPOFOL N/A 08/29/2021  ? Procedure: COLONOSCOPY WITH PROPOFOL;  Surgeon: Daryel November, MD;  Location: [facility];  Service: Gastroenterology;  Laterality: N/A;  ? ESOPHAGOGASTRODUODENOSCOPY (EGD) WITH PROPOFOL N/A 08/29/2021  ? Procedure: ESOPHAGOGASTRODUODENOSCOPY (EGD) WITH PROPOFOL;  Surgeon: Daryel November, MD;  Location: [facility];  Service: Gastroenterology;  Laterality: N/A;  ? IR THORACENTESIS ASP PLEURAL SPACE  W/IMG GUIDE  11/23/2021  ? MOUTH SURGERY    ? TUBAL LIGATION Bilateral 07/22/2014  ? Procedure: POST PARTUM TUBAL LIGATION;  Surgeon: Woodroe Mode, MD;  Location: [facility];  Service: Gynecology;  Laterality: Bilateral;  ? ? ?Family History  ?Problem Relation Age of Onset  ? Diabetes Maternal Grandmother   ? Diabetes Paternal Grandfather   ? ? ?Social History:  reports that she has never smoked. She has never used smokeless tobacco. She reports current alcohol use. She reports that she does not use drugs. ? ?Allergies:  ?Allergies  ?Allergen Reactions  ? Vitamin K And Related Hives, Shortness Of Breath, Itching and Rash  ?  Tolerated on 08/21/21 without reaction  ? Vancomycin Hives and Itching  ? ? ?Medications: Scheduled: ? ciprofloxacin  500 mg Oral Daily  ? furosemide  40 mg Oral Daily  ? lactulose  20 g Oral BID  ? midodrine  10 mg Oral TID WC  ? morphine  5 mg Oral TID  ? pantoprazole  40 mg Oral BID  ? rifaximin  550 mg Oral BID  ? spironolactone  50 mg Oral Daily  ? ?Continuous: ? ?Results for orders placed or performed during the hospital encounter of 02/05/22 (from the past 24 hour(s))  ?Lipase, blood     Status: Abnormal  ? Collection Time: 02/05/22  7:42 PM  ?Result Value Ref Range  ? Lipase 75 (H) 11 - 51 U/L  ?Comprehensive metabolic panel     Status: Abnormal  ? Collection Time: 02/05/22  7:42 PM  ?  Result Value Ref Range  ? Sodium 132 (L) 135 - 145 mmol/L  ? Potassium 3.7 3.5 - 5.1 mmol/L  ? Chloride 108 98 - 111 mmol/L  ? CO2 17 (L) 22 - 32 mmol/L  ? Glucose, Bld 146 (H) 70 - 99 mg/dL  ? BUN 10 6 - 20 mg/dL  ? Creatinine, Ser 0.68 0.44 - 1.00 mg/dL  ? Calcium 7.2 (L) 8.9 - 10.3 mg/dL  ? Total Protein 5.8 (L) 6.5 - 8.1 g/dL  ? Albumin 2.1 (L) 3.5 - 5.0 g/dL  ? AST 99 (H) 15 - 41 U/L  ? ALT 24 0 - 44 U/L  ? Alkaline Phosphatase 121 38 - 126 U/L  ? Total Bilirubin 18.2 (HH) 0.3 - 1.2 mg/dL  ? GFR, Estimated >60 >60 mL/min  ? Anion gap 7 5 - 15  ?CBC     Status: Abnormal  ? Collection Time: 02/05/22  7:42 PM   ?Result Value Ref Range  ? WBC 5.4 4.0 - 10.5 K/uL  ? RBC 2.63 (L) 3.87 - 5.11 MIL/uL  ? Hemoglobin 8.3 (L) 12.0 - 15.0 g/dL  ? HCT 25.9 (L) 36.0 - 46.0 %  ? MCV 98.5 80.0 - 100.0 fL  ? MCH 31.6 26.0 - 34.0 pg  ? MCHC 32.0 30.0 - 36.0 g/dL  ? RDW 17.2 (H) 11.5 - 15.5 %  ? Platelets 53 (L) 150 - 400 K/uL  ? nRBC 0.0 0.0 - 0.2 %  ?I-Stat beta hCG blood, ED     Status: None  ? Collection Time: 02/05/22  8:39 PM  ?Result Value Ref Range  ? I-stat hCG, quantitative <5.0 <5 mIU/mL  ? Comment 3          ?Protime-INR     Status: Abnormal  ? Collection Time: 02/06/22 12:00 AM  ?Result Value Ref Range  ? Prothrombin Time 33.0 (H) 11.4 - 15.2 seconds  ? INR 3.3 (H) 0.8 - 1.2  ?Troponin I (High Sensitivity)     Status: None  ? Collection Time: 02/06/22 12:00 AM  ?Result Value Ref Range  ? Troponin I (High Sensitivity) 7 <18 ng/L  ?Hemoglobin and hematocrit, blood     Status: Abnormal  ? Collection Time: 02/06/22  2:20 AM  ?Result Value Ref Range  ? Hemoglobin 7.3 (L) 12.0 - 15.0 g/dL  ? HCT 22.5 (L) 36.0 - 46.0 %  ?Troponin I (High Sensitivity)     Status: None  ? Collection Time: 02/06/22  2:20 AM  ?Result Value Ref Range  ? Troponin I (High Sensitivity) 7 <18 ng/L  ?Comprehensive metabolic panel     Status: Abnormal  ? Collection Time: 02/06/22  7:06 AM  ?Result Value Ref Range  ? Sodium 135 135 - 145 mmol/L  ? Potassium 3.5 3.5 - 5.1 mmol/L  ? Chloride 112 (H) 98 - 111 mmol/L  ? CO2 19 (L) 22 - 32 mmol/L  ? Glucose, Bld 91 70 - 99 mg/dL  ? BUN 6 6 - 20 mg/dL  ? Creatinine, Ser 0.56 0.44 - 1.00 mg/dL  ? Calcium 7.2 (L) 8.9 - 10.3 mg/dL  ? Total Protein 5.0 (L) 6.5 - 8.1 g/dL  ? Albumin 1.9 (L) 3.5 - 5.0 g/dL  ? AST 96 (H) 15 - 41 U/L  ? ALT 23 0 - 44 U/L  ? Alkaline Phosphatase 104 38 - 126 U/L  ? Total Bilirubin 15.9 (H) 0.3 - 1.2 mg/dL  ? GFR, Estimated >60 >60 mL/min  ? Anion gap 4 (  L) 5 - 15  ?CBC     Status: Abnormal  ? Collection Time: 02/06/22  7:06 AM  ?Result Value Ref Range  ? WBC 3.0 (L) 4.0 - 10.5 K/uL  ? RBC  2.17 (L) 3.87 - 5.11 MIL/uL  ? Hemoglobin 7.0 (L) 12.0 - 15.0 g/dL  ? HCT 20.9 (L) 36.0 - 46.0 %  ? MCV 96.3 80.0 - 100.0 fL  ? MCH 32.3 26.0 - 34.0 pg  ? MCHC 33.5 30.0 - 36.0 g/dL  ? RDW 17.6 (H) 11.5 - 15.5 %  ? Platelets 37 (L) 150 - 400 K/uL  ? nRBC 0.0 0.0 - 0.2 %  ?Lactate dehydrogenase (pleural or peritoneal fluid)     Status: Abnormal  ? Collection Time: 02/06/22 10:54 AM  ?Result Value Ref Range  ? LD, Fluid 37 (H) 3 - 23 U/L  ? Fluid Type-FLDH PLEURAL   ?Protein, pleural or peritoneal fluid     Status: None  ? Collection Time: 02/06/22 10:54 AM  ?Result Value Ref Range  ? Total protein, fluid <3.0 g/dL  ? Fluid Type-FTP PLEURAL   ?Glucose, pleural or peritoneal fluid     Status: None  ? Collection Time: 02/06/22 10:54 AM  ?Result Value Ref Range  ? Glucose, Fluid 100 mg/dL  ? Fluid Type-FGLU PLEURAL   ?  ? ?DG Chest 1 View ? ?Result Date: 02/06/2022 ?CLINICAL DATA:  Post thoracentesis.  History of pleural effusion. EXAM: CHEST  1 VIEW COMPARISON:  Chest x-ray dated 02/05/2022. FINDINGS: Significantly improved aeration at the RIGHT lung base status post thoracentesis. No pneumothorax is seen. Persistent opacity at the RIGHT costophrenic angle is likely atelectasis. LEFT lung is clear. IMPRESSION: 1. Significant improved aeration at the RIGHT lung base status post thoracentesis. No pneumothorax seen. 2. Probable residual atelectasis at the RIGHT lung base. Electronically Signed   By: Franki Cabot M.D.   On: 02/06/2022 11:25  ? ?DG Chest Portable 1 View ? ?Result Date: 02/05/2022 ?CLINICAL DATA:  sob EXAM: PORTABLE CHEST 1 VIEW.  Patient is rotated. COMPARISON:  Chest x-ray 01/18/2022, chest x-ray 01/17/2022, CT chest 11/05/2021 FINDINGS: The heart and mediastinal contours are unchanged. Right lung airspace opacity. No pulmonary edema. Small to moderate volume right pleural effusion. No pneumothorax. No acute osseous abnormality. IMPRESSION: 1.  Small to moderate volume right pleural effusion. 2. Right lung  airspace opacity that could represent a combination of atelectasis versus infection/inflammation. Electronically Signed   By: Iven Finn M.D.   On: 02/05/2022 21:02   ? ?ROS:  As stated above in the H

## 2022-02-06 NOTE — Assessment & Plan Note (Addendum)
-   It is slightly better than previous levels.  It is likely hypervolemic. ?- We will follow sodium level when she is here. ?
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
## 2022-02-06 NOTE — Assessment & Plan Note (Addendum)
-   I believe that this is chronic. ?- Her hemoglobin hematocrit are actually better than previous levels. ?- GI consultation will be obtained as mentioned above. ?- PPI therapy will be provided. ?- We will follow serial H&H ?

## 2022-02-06 NOTE — Discharge Summary (Signed)
Physician Discharge Summary  ?Victoria Gordon GYJ:[id number] DOB: 1980/12/12 DOA: 02/05/2022 ? ?PCP: Patient, [name] ? ?Admit date: 02/05/2022 ?Discharge date: 02/06/2022 ? ?Admitted From: Home ?Disposition: Home ? ?Recommendations for Outpatient Follow-up:  ?Follow up with hospice as discussed ? ?Home Health: With hospice ?Equipment/Devices: Hospital bed pending ? ?Discharge Condition: poor ?CODE STATUS: DNR ?Diet recommendation: As tolerated ? ?Brief/Interim Summary: ?Patient is an unfortunate 41 year old female with severe alcoholic associated hepatic cirrhosis, end-stage with large esophageal varices portal hypertensive gastropathy with recurrent pleural effusions, edema, pancytopenia, hyponatremia with comorbid conditions including PTSD, anxiety and substance abuse disorder. ? ?Patient recently discharged on hospice due to advanced liver disease after lengthy discussion with palliative care and hospital team at that time.  In the interim patient's symptoms have worsened, she presents back to the hospital for recurrent symptoms which we discussed will need to continue to progress as her disease advances.  Lengthy discussion today with family at bedside upwards of 9 family members, palliative care also present which we appreciate their insight and recommendations.  At this time patient requesting discharge back home on hospice, lengthy discussion with family that patient would not return to the hospital and her symptoms should be managed at home and as her disease progresses would transition to hospice care.  Hospice house was also considered but unclear if patient would meet criteria at this time for discharge to their facility. ? ?Discharge Diagnoses:  ?Principal Problem: ?  Recurrent right pleural effusion ?Active Problems: ?  Alcoholic cirrhosis of liver ([place]) ?  Alcohol use disorder, severe, dependence ([place]) ?  Hyperbilirubinemia ?  GI bleeding ?  Thrombocytopenia ([place]) ?  Normocytic anemia ?   Hyponatremia ?  Hospice care patient ?  Palliative care patient ? ? ? ?Discharge Instructions ? ?Discharge Instructions   ? ? Discharge patient   Complete by: As directed ?  ? Discharge disposition: 51-Hospice/Medical Facility  ? Discharge patient date: 02/06/2022  ? ?  ? ?Allergies as of 02/06/2022   ? ?   Reactions  ? Vitamin K And Related Hives, Shortness Of Breath, Itching, Rash  ? Tolerated on 08/21/21 without reaction  ? Vancomycin Hives, Itching  ? ?  ? ?  ?Medication List  ?  ? ?TAKE these medications   ? ?ciprofloxacin 500 MG tablet ?Commonly known as: CIPRO ?Take 1 tablet (500 mg total) by mouth daily. ?  ?diazepam 2 MG tablet ?Commonly known as: Valium ?Take 1 tablet (2 mg total) by mouth every 8 (eight) hours as needed for anxiety. ?  ?furosemide 40 MG tablet ?Commonly known as: Lasix ?Take 1 tablet (40 mg total) by mouth daily. ?  ?lactulose (encephalopathy) 10 GM/15ML Soln ?Commonly known as: [place] ?Take 30 mLs (20 g total) by mouth 2 (two) times daily. ?  ?midodrine 10 MG tablet ?Commonly known as: PROAMATINE ?Take 1 tablet (10 mg total) by mouth 3 (three) times daily with meals. ?  ?morphine 10 MG/5ML solution ?Take 2.5 mLs (5 mg total) by mouth every 6 (six) hours as needed for up to 3 days for severe pain. ?  ?ondansetron 4 MG tablet ?Commonly known as: ZOFRAN ?Take 1 tablet (4 mg total) by mouth every 6 (six) hours as needed for nausea. ?  ?pantoprazole 40 MG tablet ?Commonly known as: PROTONIX ?Take 1 tablet (40 mg total) by mouth daily. ?  ?spironolactone 50 MG tablet ?Commonly known as: Aldactone ?Take 1 tablet (50 mg total) by mouth daily. ?  ?traZODone 50 MG tablet ?  Commonly known as: DESYREL ?Take 0.5 tablets (25 mg total) by mouth at bedtime as needed for sleep. ?  ?Xifaxan 550 MG Tabs tablet ?Generic drug: rifaximin ?Take 1 tablet (550 mg total) by mouth 2 (two) times daily. ?  ? ?  ? ?  ?  ? ? ?  ?Durable Medical Equipment  ?(From admission, onward)  ?  ? ? ?  ? ?  Start     Ordered  ?  02/06/22 1258  For home use only DME Hospital bed  Once       ?Question Answer Comment  ?Length of Need Lifetime   ?Patient has (list medical condition): End stage Cirrhosis for hospice   ?Bed type Semi-electric   ?  ? 02/06/22 1258  ? ?  ?  ? ?  ? ? ?Allergies  ?Allergen Reactions  ? Vitamin K And Related Hives, Shortness Of Breath, Itching and Rash  ?  Tolerated on 08/21/21 without reaction  ? Vancomycin Hives and Itching  ? ? ?Consultations: ?Palliative care ? ? ?Procedures/Studies: ?DG Chest 1 View ? ?Result Date: 02/06/2022 ?CLINICAL DATA:  Post thoracentesis.  History of pleural effusion. EXAM: CHEST  1 VIEW COMPARISON:  Chest x-ray dated 02/05/2022. FINDINGS: Significantly improved aeration at the RIGHT lung base status post thoracentesis. No pneumothorax is seen. Persistent opacity at the RIGHT costophrenic angle is likely atelectasis. LEFT lung is clear. IMPRESSION: 1. Significant improved aeration at the RIGHT lung base status post thoracentesis. No pneumothorax seen. 2. Probable residual atelectasis at the RIGHT lung base. Electronically Signed   By: Franki Cabot M.D.   On: 02/06/2022 11:25  ? ?DG Chest 2 View ? ?Result Date: 01/17/2022 ?CLINICAL DATA:  Dyspnea. Abdominal swelling and shortness of breath. History of ascites. EXAM: CHEST - 2 VIEW COMPARISON:  Radiograph 11/23/2021, CT 11/05/2021 FINDINGS: Progressive right pleural effusion, large in size occupying the lower 2/3 of right hemithorax. Right suprahilar atelectasis peer there is mild leftward shift of the heart. Retrocardiac opacity corresponds to prominent esophageal varices on prior CT. There is no significant left pleural effusion, the left lung is clear. No pneumothorax or pulmonary edema. IMPRESSION: Progressive right pleural effusion, large in size, occupying the lower 2/3 of right hemithorax. Electronically Signed   By: Keith Rake M.D.   On: 01/17/2022 19:51  ? ?US Abdomen Limited ? ?Result Date: 01/18/2022 ?CLINICAL DATA:  Cirrhosis  and ascites.  Cholecystectomy. EXAM: ULTRASOUND ABDOMEN LIMITED RIGHT UPPER QUADRANT COMPARISON:  Ultrasound abdomen 11/06/2021. CT chest abdomen and pelvis 11/05/2021. FINDINGS: Gallbladder: Surgically absent. Common bile duct: Diameter: 8 mm Liver: No focal lesion identified. Small in size with increased echogenicity and nodular contour. Mild intrahepatic biliary ductal dilatation. Portal vein is patent on color Doppler imaging with normal direction of blood flow towards the liver. Other: Right pleural effusion. Moderate ascites. Enlarged spleen measuring up to 17.7 cm. IMPRESSION: 1. Cholecystectomy. There is mild intra and extrahepatic biliary ductal dilatation which appears grossly unchanged from prior CT. Recommend correlation with lab values. 2. Stable moderate splenomegaly. 3. Stable liver cirrhosis. 4. Moderate ascites. 5. Right pleural effusion. Electronically Signed   By: Ronney Asters M.D.   On: 01/18/2022 16:04  ? ?DG Chest Portable 1 View ? ?Result Date: 02/05/2022 ?CLINICAL DATA:  sob EXAM: PORTABLE CHEST 1 VIEW.  Patient is rotated. COMPARISON:  Chest x-ray 01/18/2022, chest x-ray 01/17/2022, CT chest 11/05/2021 FINDINGS: The heart and mediastinal contours are unchanged. Right lung airspace opacity. No pulmonary edema. Small to moderate volume right pleural  effusion. No pneumothorax. No acute osseous abnormality. IMPRESSION: 1.  Small to moderate volume right pleural effusion. 2. Right lung airspace opacity that could represent a combination of atelectasis versus infection/inflammation. Electronically Signed   By: Iven Finn M.D.   On: 02/05/2022 21:02  ? ?DG CHEST PORT 1 VIEW ? ?Result Date: 01/18/2022 ?CLINICAL DATA:  Recent thoracentesis EXAM: PORTABLE CHEST 1 VIEW COMPARISON:  01/17/2022 FINDINGS: Improvement in the right effusion following thoracentesis. Small to moderate residual right effusion remains extending along the right lateral chest. No pneumothorax. Improved aeration of the right  lung with residual right lower lung atelectasis. Left lung remains clear. Stable heart size and vascularity. Trachea midline. No acute osseous finding. IMPRESSION: Improved in the right effusion following t

## 2022-02-07 LAB — CYTOLOGY - NON PAP

## 2022-02-07 LAB — MISC LABCORP TEST (SEND OUT): Labcorp test code: 9985

## 2022-02-08 LAB — ACID FAST SMEAR (AFB, MYCOBACTERIA): Acid Fast Smear: NEGATIVE

## 2022-02-11 LAB — CULTURE, BODY FLUID W GRAM STAIN -BOTTLE: Culture: NO GROWTH

## 2022-02-23 ENCOUNTER — Emergency Department (HOSPITAL_COMMUNITY): Payer: Medicaid Other

## 2022-02-23 ENCOUNTER — Observation Stay (HOSPITAL_COMMUNITY)
Admission: EM | Admit: 2022-02-23 | Discharge: 2022-02-24 | Disposition: A | Discharge status: Hospice | Payer: Medicaid Other | Attending: Internal Medicine | Admitting: Internal Medicine

## 2022-02-23 ENCOUNTER — Encounter (HOSPITAL_COMMUNITY): Payer: Self-pay | Admitting: Emergency Medicine

## 2022-02-23 DIAGNOSIS — E876 Hypokalemia: Secondary | ICD-10-CM | POA: Diagnosis present

## 2022-02-23 DIAGNOSIS — I8510 Secondary esophageal varices without bleeding: Secondary | ICD-10-CM | POA: Diagnosis present

## 2022-02-23 DIAGNOSIS — F32A Depression, unspecified: Secondary | ICD-10-CM | POA: Diagnosis present

## 2022-02-23 DIAGNOSIS — D61818 Other pancytopenia: Secondary | ICD-10-CM | POA: Diagnosis present

## 2022-02-23 DIAGNOSIS — K7210 Chronic hepatic failure without coma: Secondary | ICD-10-CM | POA: Diagnosis present

## 2022-02-23 DIAGNOSIS — E871 Hypo-osmolality and hyponatremia: Secondary | ICD-10-CM | POA: Insufficient documentation

## 2022-02-23 DIAGNOSIS — R0602 Shortness of breath: Secondary | ICD-10-CM | POA: Diagnosis present

## 2022-02-23 DIAGNOSIS — R188 Other ascites: Secondary | ICD-10-CM | POA: Diagnosis present

## 2022-02-23 DIAGNOSIS — I959 Hypotension, unspecified: Secondary | ICD-10-CM | POA: Diagnosis not present

## 2022-02-23 DIAGNOSIS — Z515 Encounter for palliative care: Secondary | ICD-10-CM

## 2022-02-23 DIAGNOSIS — K7031 Alcoholic cirrhosis of liver with ascites: Principal | ICD-10-CM | POA: Diagnosis present

## 2022-02-23 DIAGNOSIS — K7030 Alcoholic cirrhosis of liver without ascites: Secondary | ICD-10-CM | POA: Diagnosis present

## 2022-02-23 DIAGNOSIS — Z79899 Other long term (current) drug therapy: Secondary | ICD-10-CM | POA: Diagnosis not present

## 2022-02-23 DIAGNOSIS — F1010 Alcohol abuse, uncomplicated: Secondary | ICD-10-CM | POA: Insufficient documentation

## 2022-02-23 DIAGNOSIS — J90 Pleural effusion, not elsewhere classified: Secondary | ICD-10-CM | POA: Diagnosis present

## 2022-02-23 DIAGNOSIS — R1084 Generalized abdominal pain: Secondary | ICD-10-CM

## 2022-02-23 HISTORY — DX: Unspecified cirrhosis of liver: K74.60

## 2022-02-23 HISTORY — PX: IR THORACENTESIS ASP PLEURAL SPACE W/IMG GUIDE: IMG5380

## 2022-02-23 LAB — CBC WITH DIFFERENTIAL/PLATELET
Abs Immature Granulocytes: 0.01 10*3/uL (ref 0.00–0.07)
Basophils Absolute: 0 10*3/uL (ref 0.0–0.1)
Basophils Relative: 1 %
Eosinophils Absolute: 0.2 10*3/uL (ref 0.0–0.5)
Eosinophils Relative: 8 %
HCT: 26.7 % — ABNORMAL LOW (ref 36.0–46.0)
Hemoglobin: 8.3 g/dL — ABNORMAL LOW (ref 12.0–15.0)
Immature Granulocytes: 0 %
Lymphocytes Relative: 25 %
Lymphs Abs: 0.7 10*3/uL (ref 0.7–4.0)
MCH: 33.7 pg (ref 26.0–34.0)
MCHC: 31.1 g/dL (ref 30.0–36.0)
MCV: 108.5 fL — ABNORMAL HIGH (ref 80.0–100.0)
Monocytes Absolute: 0.4 10*3/uL (ref 0.1–1.0)
Monocytes Relative: 13 %
Neutro Abs: 1.6 10*3/uL — ABNORMAL LOW (ref 1.7–7.7)
Neutrophils Relative %: 53 %
Platelets: 32 10*3/uL — ABNORMAL LOW (ref 150–400)
RBC: 2.46 MIL/uL — ABNORMAL LOW (ref 3.87–5.11)
RDW: 19.9 % — ABNORMAL HIGH (ref 11.5–15.5)
WBC: 3 10*3/uL — ABNORMAL LOW (ref 4.0–10.5)
nRBC: 0 % (ref 0.0–0.2)

## 2022-02-23 LAB — AMMONIA: Ammonia: 25 umol/L (ref 9–35)

## 2022-02-23 LAB — COMPREHENSIVE METABOLIC PANEL
ALT: 31 U/L (ref 0–44)
AST: 73 U/L — ABNORMAL HIGH (ref 15–41)
Albumin: 2 g/dL — ABNORMAL LOW (ref 3.5–5.0)
Alkaline Phosphatase: 102 U/L (ref 38–126)
Anion gap: 8 (ref 5–15)
BUN: 14 mg/dL (ref 6–20)
CO2: 22 mmol/L (ref 22–32)
Calcium: 8 mg/dL — ABNORMAL LOW (ref 8.9–10.3)
Chloride: 100 mmol/L (ref 98–111)
Creatinine, Ser: 1.08 mg/dL — ABNORMAL HIGH (ref 0.44–1.00)
GFR, Estimated: >60 mL/min (ref >60)
Glucose, Bld: 101 mg/dL — ABNORMAL HIGH (ref 70–99)
Potassium: 3.3 mmol/L — ABNORMAL LOW (ref 3.5–5.1)
Sodium: 130 mmol/L — ABNORMAL LOW (ref 135–145)
Total Bilirubin: 24 mg/dL (ref 0.3–1.2)
Total Protein: 5.7 g/dL — ABNORMAL LOW (ref 6.5–8.1)

## 2022-02-23 LAB — APTT: aPTT: 56 seconds — ABNORMAL HIGH (ref 24–36)

## 2022-02-23 LAB — TROPONIN I (HIGH SENSITIVITY)
Troponin I (High Sensitivity): 3 ng/L (ref <18)
Troponin I (High Sensitivity): 5 ng/L (ref <18)

## 2022-02-23 LAB — PROTIME-INR
INR: 2.9 — ABNORMAL HIGH (ref 0.8–1.2)
Prothrombin Time: 30.5 seconds — ABNORMAL HIGH (ref 11.4–15.2)

## 2022-02-23 LAB — ETHANOL: Alcohol, Ethyl (B): <10 mg/dL (ref <10)

## 2022-02-23 LAB — BILIRUBIN, DIRECT: Bilirubin, Direct: 7.8 mg/dL — ABNORMAL HIGH (ref 0.0–0.2)

## 2022-02-23 LAB — MAGNESIUM: Magnesium: 1.7 mg/dL (ref 1.7–2.4)

## 2022-02-23 MED ORDER — MIDODRINE HCL 5 MG PO TABS
5.0000 mg | ORAL_TABLET | Freq: Three times a day (TID) | ORAL | Status: DC
  Administered 2022-02-23: 5 mg via ORAL
  Filled 2022-02-23: qty 1

## 2022-02-23 MED ORDER — LORAZEPAM 0.5 MG PO TABS: 0.5000 mg | ORAL_TABLET | ORAL | Status: DC | PRN

## 2022-02-23 MED ORDER — MIDODRINE HCL 5 MG PO TABS
10.0000 mg | ORAL_TABLET | Freq: Three times a day (TID) | ORAL | Status: DC
  Administered 2022-02-23 – 2022-02-24 (×3): 10 mg via ORAL
  Filled 2022-02-23 (×3): qty 2

## 2022-02-23 MED ORDER — LACTATED RINGERS IV BOLUS
500.0000 mL | Freq: Once | INTRAVENOUS | Status: AC
Start: 2022-02-23 — End: 2022-02-23
  Administered 2022-02-23: 500 mL via INTRAVENOUS

## 2022-02-23 MED ORDER — MORPHINE SULFATE (CONCENTRATE) 10 MG/0.5ML PO SOLN
5.0000 mg | ORAL | Status: DC | PRN
  Administered 2022-02-24: 5 mg via ORAL
  Filled 2022-02-23 (×2): qty 0.5

## 2022-02-23 MED ORDER — LACTULOSE 10 GM/15ML PO SOLN
20.0000 g | Freq: Two times a day (BID) | ORAL | Status: DC
  Administered 2022-02-24: 20 g via ORAL
  Filled 2022-02-23 (×3): qty 30

## 2022-02-23 MED ORDER — SODIUM CHLORIDE 0.9 % IV SOLN: 250.0000 mL | INTRAVENOUS | Status: DC | PRN

## 2022-02-23 MED ORDER — POTASSIUM CHLORIDE CRYS ER 20 MEQ PO TBCR
20.0000 meq | EXTENDED_RELEASE_TABLET | Freq: Once | ORAL | Status: AC
  Administered 2022-02-23: 20 meq via ORAL
  Filled 2022-02-23: qty 1

## 2022-02-23 MED ORDER — TRAZODONE HCL 50 MG PO TABS: 50.0000 mg | ORAL_TABLET | Freq: Every evening | ORAL | Status: DC | PRN

## 2022-02-23 MED ORDER — SODIUM CHLORIDE 0.9% FLUSH
3.0000 mL | Freq: Two times a day (BID) | INTRAVENOUS | Status: DC
  Administered 2022-02-23 – 2022-02-24 (×2): 3 mL via INTRAVENOUS

## 2022-02-23 MED ORDER — SODIUM CHLORIDE 0.9 % IV SOLN
2.0000 g | INTRAVENOUS | Status: DC
  Administered 2022-02-24: 2 g via INTRAVENOUS
  Filled 2022-02-23: qty 20

## 2022-02-23 MED ORDER — SODIUM CHLORIDE 0.9 % IV SOLN
1.0000 g | Freq: Once | INTRAVENOUS | Status: AC
  Administered 2022-02-23: 1 g via INTRAVENOUS
  Filled 2022-02-23: qty 10

## 2022-02-23 MED ORDER — RIFAXIMIN 550 MG PO TABS
550.0000 mg | ORAL_TABLET | Freq: Two times a day (BID) | ORAL | Status: DC
  Administered 2022-02-23 – 2022-02-24 (×2): 550 mg via ORAL
  Filled 2022-02-23 (×3): qty 1

## 2022-02-23 MED ORDER — ONDANSETRON HCL 4 MG PO TABS: 4.0000 mg | ORAL_TABLET | Freq: Four times a day (QID) | ORAL | Status: DC | PRN

## 2022-02-23 MED ORDER — CEFTRIAXONE SODIUM 2 G IJ SOLR: 2.0000 g | Freq: Three times a day (TID) | INTRAMUSCULAR | Status: DC

## 2022-02-23 MED ORDER — PANTOPRAZOLE SODIUM 40 MG PO TBEC
40.0000 mg | DELAYED_RELEASE_TABLET | Freq: Every day | ORAL | Status: DC
  Administered 2022-02-24: 40 mg via ORAL
  Filled 2022-02-23: qty 1

## 2022-02-23 MED ORDER — PROCHLORPERAZINE MALEATE 10 MG PO TABS
10.0000 mg | ORAL_TABLET | ORAL | Status: DC | PRN
  Filled 2022-02-23: qty 1

## 2022-02-23 MED ORDER — ALBUMIN HUMAN 25 % IV SOLN
12.5000 g | Freq: Once | INTRAVENOUS | Status: AC
  Administered 2022-02-23: 12.5 g via INTRAVENOUS
  Filled 2022-02-23: qty 50

## 2022-02-23 MED ORDER — LIDOCAINE HCL 1 % IJ SOLN
INTRAMUSCULAR | Status: AC
  Administered 2022-02-23: 10 mL
  Filled 2022-02-23: qty 20

## 2022-02-23 MED ORDER — SODIUM CHLORIDE 0.9% FLUSH
3.0000 mL | INTRAVENOUS | Status: DC | PRN
Start: 2022-02-23 — End: 2022-02-24
  Administered 2022-02-23: 3 mL via INTRAVENOUS

## 2022-02-23 NOTE — Assessment & Plan Note (Signed)
Continue PPI ?

## 2022-02-23 NOTE — ED Provider Triage Note (Signed)
Emergency Medicine Provider Triage Evaluation Note ? ?Victoria Gordon , a 41 y.o. female  was evaluated in triage.  Pt complains of shortness of breath.  Pt reports difficulty breathing due to swelling and fluid ? ?Review of Systems  ?Positive: Shortness of breath ?Negative: fever ? ?Physical Exam  ?BP (!) 94/57 (BP Location: Left Arm)   Pulse 71   Resp 18   LMP  (LMP Unknown) Comment: pt. stated, its been over a year  SpO2 99%  ?Gen:   Awake, jaundice,  ?Resp:  normal ?MSK:   Moves extremities without difficulty  ?Other:  Abdomen swollen  ? ?Medical Decision Making  ?Medically screening exam initiated at 10:17 AM.  Appropriate orders placed.  Victoria Gordon was informed that the remainder of the evaluation will be completed by another provider, this initial triage assessment does not replace that evaluation, and the importance of remaining in the ED until their evaluation is complete. ? ? ?  ?Fransico Meadow, PA-C ?02/23/22 1021 ? ?

## 2022-02-23 NOTE — Assessment & Plan Note (Signed)
Continue trazodone and valium prn  ?

## 2022-02-23 NOTE — Assessment & Plan Note (Addendum)
Stable

## 2022-02-23 NOTE — ED Notes (Signed)
Dinner tray ordered.

## 2022-02-23 NOTE — ED Provider Notes (Signed)
Care assumed from Dr. Armandina Gemma.  Patient with end-stage liver disease secondary to alcohol abuse and cirrhosis here with recurrent pleural effusions and abdominal pain. ? ?She is status postthoracentesis by IR.  Paracentesis was not successful as there is not enough fluid to drain.  6 reviewed by me shows improvement in pleural effusion with no pneumothorax ? ?Remains hypotensive in the 80s.  Plan is to give fluids and antibiotics for admission overnight. ?She also has not been taking her midodrine. ?Discussed with patient and family at bedside.  They are aware her disease is end-stage and likely terminal for her.  They would prefer admission overnight to manage her symptoms. patient to be covered for SBP given her hypotension and significant abdominal pain.  Discussed with Dr. Rogers Blocker. ?  Ezequiel Essex, MD ?02/23/22 1930 ? ?

## 2022-02-23 NOTE — Procedures (Signed)
PROCEDURE SUMMARY: ? ?Successful US guided right thoracentesis. ?Yielded 2.4L of amber fluid. ?Pt tolerated procedure well. ?No immediate complications. ? ?Specimen not sent for labs. ?CXR ordered. ? ?EBL < 5 mL ? ?Jla Reynolds PA-C ?02/23/2022 ?3:27 PM ? ? ? ?

## 2022-02-23 NOTE — Assessment & Plan Note (Addendum)
S/p IR thoracentesis with removal of 2.4L.  Dyspnea has improved. ?

## 2022-02-23 NOTE — ED Notes (Signed)
Pt ambulated gingerly to the bathroom.  Pt states she never really felt dizzy. ?

## 2022-02-23 NOTE — ED Notes (Signed)
Dinner tray delivered.

## 2022-02-23 NOTE — Assessment & Plan Note (Addendum)
She has end-stage liver disease.  Deemed not a transplant candidate by [facility] transplant team and subsequently has been on hospice care. ?Initially there was concern for SBP but not much ascites was noted by IR when they attempted paracentesis. ?Hypotension is due to her end-stage liver disease.  Continue midodrine. ?Patient was under hospice care at home.  Due to hypotension her diuretics are on hold. ?Continue lactulose, rifaximin, morphine and anti-emetics  ?Seen by palliative care.  After long discussion patient expressed wish to go home to be with her family for now.  She will decide on residential hospice at a later date.  In view of this it was felt that she could be discharged home after her blood transfusion. ?

## 2022-02-23 NOTE — H&P (Signed)
?History and Physical  ? ? ?Patient: Victoria Gordon QMG:[id number] DOB: 1981-01-27 ?DOA: 02/23/2022 ?DOS: the patient was seen and examined on 02/23/2022 ?PCP: Pcp, No  ?Patient coming from: Home - lives with her mother, sister and her daughter ? ? ?Chief Complaint: abdominal pain/shortness of breath and trouble walking ? ?HPI: Victoria Gordon is a 41 y.o. 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 She states her symptoms started about one week ago. She states her shortness of breath has progressively gotten worse. She is so short of breath she can not walk far or much at all. She has not had any cough or fevers. Her stomach pain is generalized and rated as a 8/10 and described as sharp in nature. Does not radiate. She had one episode of vomiting yesterday and today. She states when the fluid builds up she will have emesis. No blood in her vomit.  ? ? ? Denies any fever/chills, vision changes/headaches, chest pain or palpitations, no diarrhea,no dysuria. Leg swelling at baseline.  ? ?No smoking. Still uses alcohol, but last drank 3 weeks ago.  ? ? ?ER Course:  vitals: afebrile>96.6, bp: 91/49, HR: 55, RR: 14, oxygen: 100% RA ?Pertinent labs: wbc: 3.0, hgb: 8.3, platelets 32, sodium: 130, potassium: 3.3, creatinine 1.08, AST: 73, ALT: 31, total bili: 24, INR: 2.9,  ?CXR: moderate right pleural effusion  ?In ED: IR consulted for right thoracentesis. Assessed diagnostic paracentesis, but had trace left lower quadrant ascites, too deep and limited volume for attempted paracentesis.  ?Given LR bolus, albumin and 1g rocephin. Original plan was  for her to go home after thoracentesis, but ED physician concerned for possible SBP and wanted observation overnight with abx/IVF.  ? ? ?Review of Systems: As mentioned in the history of present illness. All other systems reviewed and are negative. ?Past Medical History:  ?Diagnosis Date  ? Alcohol abuse   ? Alcoholic cirrhosis of liver with ascites ([place])   ? Anemia   ? Anxiety   ? Cirrhosis of liver ([place])   ? Hemorrhoid   ? Hepatitis B   ? Major depressive disorder   ? PTSD (post-traumatic stress disorder)   ? Substance abuse ([place])   ? ?Past Surgical History:  ?Procedure Laterality Date  ? BIOPSY  08/29/2021  ? Procedure: BIOPSY;  Surgeon: Daryel November, MD;  Location: [facility];  Service: Gastroenterology;;  ? CESAREAN SECTION    ? CHOLECYSTECTOMY    ? COLONOSCOPY WITH PROPOFOL N/A 08/29/2021  ? Procedure: COLONOSCOPY WITH PROPOFOL;  Surgeon: Daryel November, MD;  Location: [facility];  Service: Gastroenterology;  Laterality: N/A;  ? ESOPHAGOGASTRODUODENOSCOPY (EGD) WITH PROPOFOL N/A 08/29/2021  ? Procedure: ESOPHAGOGASTRODUODENOSCOPY (EGD) WITH PROPOFOL;  Surgeon: Daryel November, MD;  Location: [facility];  Service: Gastroenterology;  Laterality: N/A;  ? IR THORACENTESIS ASP PLEURAL SPACE W/IMG GUIDE  11/23/2021  ? IR THORACENTESIS ASP PLEURAL SPACE W/IMG GUIDE  02/23/2022  ? MOUTH SURGERY    ? TUBAL LIGATION Bilateral 07/22/2014  ? Procedure: POST PARTUM TUBAL LIGATION;  Surgeon: Woodroe Mode, MD;  Location: [facility];  Service: Gynecology;  Laterality: Bilateral;  ? ?Social History:  reports that she has never smoked. She has never used smokeless tobacco.  She reports current alcohol use. She reports that she does not use drugs. ? ?Allergies  ?Allergen Reactions  ? Vitamin K And Related Hives, Shortness Of Breath, Itching and Rash  ?  Tolerated on 08/21/21 without reaction  ? Vancomycin Hives and Itching  ? ? ?Family History  ?Problem Relation Age of Onset  ? Diabetes Maternal Grandmother   ?  Diabetes Paternal Grandfather   ? ? ?Prior to Admission medications   ?Medication Sig Start Date End Date Taking? Authorizing Provider  ?ciprofloxacin (CIPRO) 500 MG tablet Take 1 tablet (500 mg total) by mouth daily. 01/20/22  Yes Amin, Jeanella Flattery, MD  ?furosemide (LASIX) 40 MG tablet Take 1 tablet (40 mg total) by mouth daily. 01/20/22 02/23/22 Yes Amin, Jeanella Flattery, MD  ?lactulose, encephalopathy, (CHRONULAC) 10 GM/15ML SOLN Take 10 g by mouth.   Yes [provider]  ?nitrofurantoin (MACRODANTIN) 100 MG capsule Take 100 mg by mouth 4 (four) times daily.   Yes [provider]  ?pantoprazole (PROTONIX) 40 MG tablet Take 1 tablet (40 mg total) by mouth daily. 01/20/22 02/23/22 Yes Amin, Jeanella Flattery, MD  ?prochlorperazine (COMPAZINE) 10 MG tablet Take 10 mg by mouth every 6 (six) hours as needed for nausea or vomiting.   Yes [provider]  ?propranolol (INDERAL) 10 MG tablet Take 10 mg by mouth 3 (three) times daily.   Yes [provider]  ?rifaximin (XIFAXAN) 550 MG TABS tablet Take 550 mg by mouth.   Yes [provider]  ?spironolactone (ALDACTONE) 50 MG tablet Take 1 tablet (50 mg total) by mouth daily. 01/20/22 02/23/22 Yes Amin, Jeanella Flattery, MD  ?traZODone (DESYREL) 50 MG tablet Take 0.5 tablets (25 mg total) by mouth at bedtime as needed for sleep. 02/06/22  Yes Little Ishikawa, MD  ?diazepam (VALIUM) 2 MG tablet Take 1 tablet (2 mg total) by mouth every 8 (eight) hours as needed for anxiety. 02/06/22 02/06/23  Little Ishikawa, MD  ?ondansetron (ZOFRAN) 4 MG tablet Take 1 tablet (4 mg total) by mouth every 6 (six) hours as needed for nausea. 02/06/22   Little Ishikawa, MD  ? ? ?Physical Exam: ?Vitals:  ? 02/23/22 1430 02/23/22 1611 02/23/22 1633 02/23/22 1730  ?BP: 90/71 (!) 87/56 (!) 87/54 (!) 86/53  ?Pulse: (!) 55 (!) 57 (!) 55 (!) 58  ?Resp: 15 (!) 9 14 (!) 9  ?Temp:      ?TempSrc:      ?SpO2: 98% 98% 100% 99%  ? ?General:  Appears calm and comfortable and  is in NAD. Jaundiced.  ?Eyes:  PERRL, EOMI, normal lids, iris. Scleral icterus  ?ENT:  grossly normal hearing, lips & tongue, mmm; appropriate dentition ?Neck:  no LAD, masses or thyromegaly; no carotid bruits ?Cardiovascular:  RRR, loud systolic murmur. +LE edema.  ?Respiratory:   CTA bilaterally with no wheezes/rales/rhonchi.  Normal respiratory effort. ?Abdomen:  soft, ND, NABS. No rebound or guarding. Mild TTP on exam. No fluid wave  ?Back:   normal alignment, no CVAT ?Skin:  petechial rash over legs. Globally jaundiced.  ?Musculoskeletal:  grossly normal tone BUE/BLE, good ROM, no bony abnormality ?Lower extremity: LE edema.  Limited foot exam with no ulcerations.  2+ distal pulses. ?Psychiatric:  grossly normal mood and affect, speech fluent and appropriate, AOx3 ?Neurologic:  CN 2-12 grossly intact, moves all extremities in coordinated fashion, sensation intact ? ? ?Radiological Exams on Admission: ?Independently reviewed - see discussion in A/P where applicable ? ?DG Chest 1 View ? ?Result  Date: 02/23/2022 ?CLINICAL DATA:  Right thoracentesis. EXAM: CHEST  1 VIEW COMPARISON:  Radiograph earlier today FINDINGS: Decreased right pleural effusion post thoracentesis. No pneumothorax. Persistent ill-defined opacity at the periphery of the right lung base may represent small residual pleural effusion and/or atelectasis. Mild cardiomegaly. No convincing left pleural effusion. Stable osseous structures. IMPRESSION: 1. Decreased right pleural effusion post thoracentesis. No pneumothorax. 2. Persistent ill-defined opacity at the periphery of the right lung base may represent small residual pleural effusion and/or atelectasis. Electronically Signed   By: Keith Rake M.D.   On: 02/23/2022 15:25  ? ?DG Chest 2 View ? ?Result Date: 02/23/2022 ?CLINICAL DATA:  Shortness of breath EXAM: CHEST - 2 VIEW COMPARISON:  Chest x-ray dated February 06, 2022 FINDINGS: Cardiac and mediastinal contours are unchanged when compared with  multiple prior exams. Moderate right pleural effusion, increased in size when compared with prior exam. No focal consolidation. No evidence of pneumothorax. IMPRESSION: Moderate right pleural effusion, i

## 2022-02-23 NOTE — ED Provider Notes (Addendum)
?[facility] ?Provider Note ? ? ?CSN: [id number] ?Arrival date & time: 02/23/22  0623 ? ?  ? ?History ? ?Chief Complaint  ?Patient presents with  ? Chest Pain  ? Shortness of Breath  ? ? ?Victoria Gordon is a 41 y.o. female. ? ? ?Chest Pain ?Associated symptoms: abdominal pain and shortness of breath   ?Associated symptoms: no fever   ?Shortness of Breath ?Associated symptoms: abdominal pain and chest pain   ?Associated symptoms: no fever   ? ?41 year old female with medical history significant for PTSD, MDD, chronic alcohol abuse, hepatitis B, alcoholic cirrhosis of the liver with ascites, now on hospice, recurrent pleural effusions from her cirrhosis who presents to the emergency department with chest pain, shortness of breath.  She also endorses abdominal distention and generalized abdominal discomfort.  She denies any fevers or chills.  Given her worsening shortness of breath, some concern for recurrence of her pleural effusions by hospice nursing.  She was told to present to the emergency department for likely thoracentesis and drain placement by IR by her hospice nurse.  ? ?Per recent discharge summary dated 02/06/22: ?"Patient recently discharged on hospice due to advanced liver disease after lengthy discussion with palliative care and hospital team at that time.  In the interim patient's symptoms have worsened, she presents back to the hospital for recurrent symptoms which we discussed will need to continue to progress as her disease advances.  Lengthy discussion today with family at bedside upwards of 9 family members, palliative care also present which we appreciate their insight and recommendations.  At this time patient requesting discharge back home on hospice, lengthy discussion with family that patient would not return to the hospital and her symptoms should be managed at home and as her disease progresses would transition to hospice care.  Hospice house was  also considered but unclear if patient would meet criteria at this time for discharge to their facility." ? ?Home Medications ?Prior to Admission medications   ?Medication Sig Start Date End Date Taking? Authorizing Provider  ?ciprofloxacin (CIPRO) 500 MG tablet Take 1 tablet (500 mg total) by mouth daily. 01/20/22  Yes Amin, Jeanella Flattery, MD  ?furosemide (LASIX) 40 MG tablet Take 1 tablet (40 mg total) by mouth daily. 01/20/22 02/23/22 Yes Amin, Jeanella Flattery, MD  ?lactulose, encephalopathy, (CHRONULAC) 10 GM/15ML SOLN Take 10 g by mouth.   Yes [provider]  ?nitrofurantoin (MACRODANTIN) 100 MG capsule Take 100 mg by mouth 4 (four) times daily.   Yes [provider]  ?pantoprazole (PROTONIX) 40 MG tablet Take 1 tablet (40 mg total) by mouth daily. 01/20/22 02/23/22 Yes Amin, Jeanella Flattery, MD  ?prochlorperazine (COMPAZINE) 10 MG tablet Take 10 mg by mouth every 6 (six) hours as needed for nausea or vomiting.   Yes [provider]  ?propranolol (INDERAL) 10 MG tablet Take 10 mg by mouth 3 (three) times daily.   Yes [provider]  ?rifaximin (XIFAXAN) 550 MG TABS tablet Take 550 mg by mouth.   Yes [provider]  ?spironolactone (ALDACTONE) 50 MG tablet Take 1 tablet (50 mg total) by mouth daily. 01/20/22 02/23/22 Yes Amin, Jeanella Flattery, MD  ?traZODone (DESYREL) 50 MG tablet Take 0.5 tablets (25 mg total) by mouth at bedtime as needed for sleep. 02/06/22  Yes Little Ishikawa, MD  ?diazepam (VALIUM) 2 MG tablet Take 1 tablet (2 mg total) by mouth every 8 (eight) hours as needed for anxiety. 02/06/22 02/06/23  Avon Gully,  Luanna Cole, MD  ?ondansetron (ZOFRAN) 4 MG tablet Take 1 tablet (4 mg total) by mouth every 6 (six) hours as needed for nausea. 02/06/22   Little Ishikawa, MD  ?   ? ?Allergies    ?Vitamin k and related and Vancomycin   ? ?Review of Systems   ?Review of Systems  ?Constitutional:  Negative for chills and fever.  ?Respiratory:  Positive for shortness of breath.    ?Cardiovascular:  Positive for chest pain.  ?Gastrointestinal:  Positive for abdominal distention and abdominal pain.  ?All other systems reviewed and are negative. ? ?Physical Exam ?Updated Vital Signs ?BP 90/71   Pulse (!) 55   Temp (S) (!) 96.6 ?F (35.9 ?C) (Rectal)   Resp 15   LMP  (LMP Unknown) Comment: pt. stated, its been over a year  SpO2 98%  ?Physical Exam ?Vitals and nursing note reviewed.  ?Constitutional:   ?   General: She is not in acute distress. ?   Appearance: She is well-developed.  ?HENT:  ?   Head: Normocephalic and atraumatic.  ?Eyes:  ?   General: Scleral icterus present.  ?   Conjunctiva/sclera: Conjunctivae normal.  ?Cardiovascular:  ?   Rate and Rhythm: Normal rate and regular rhythm.  ?   Heart sounds: Murmur heard.  ?Pulmonary:  ?   Effort: Pulmonary effort is normal. No respiratory distress.  ?   Breath sounds: Normal breath sounds.  ?Abdominal:  ?   General: There is distension.  ?   Palpations: Abdomen is soft.  ?   Tenderness: There is abdominal tenderness.  ?   Comments: Generalized abdominal distention and tenderness to palpation diffusely noted  ?Musculoskeletal:     ?   General: No swelling.  ?   Cervical back: Neck supple.  ?Skin: ?   General: Skin is warm and dry.  ?   Capillary Refill: Capillary refill takes less than 2 seconds.  ?Neurological:  ?   Mental Status: She is alert.  ?Psychiatric:     ?   Mood and Affect: Mood normal.  ? ? ?ED Results / Procedures / Treatments   ?Labs ?(all labs ordered are listed, but only abnormal results are displayed) ?Labs Reviewed  ?CBC WITH DIFFERENTIAL/PLATELET - Abnormal; Notable for the following components:  ?    Result Value  ? WBC 3.0 (*)   ? RBC 2.46 (*)   ? Hemoglobin 8.3 (*)   ? HCT 26.7 (*)   ? MCV 108.5 (*)   ? RDW 19.9 (*)   ? Platelets 32 (*)   ? Neutro Abs 1.6 (*)   ? All other components within normal limits  ?COMPREHENSIVE METABOLIC PANEL - Abnormal; Notable for the following components:  ? Sodium 130 (*)   ? Potassium  3.3 (*)   ? Glucose, Bld 101 (*)   ? Creatinine, Ser 1.08 (*)   ? Calcium 8.0 (*)   ? Total Protein 5.7 (*)   ? Albumin 2.0 (*)   ? AST 73 (*)   ? Total Bilirubin 24.0 (*)   ? All other components within normal limits  ?APTT - Abnormal; Notable for the following components:  ? aPTT 56 (*)   ? All other components within normal limits  ?PROTIME-INR - Abnormal; Notable for the following components:  ? Prothrombin Time 30.5 (*)   ? INR 2.9 (*)   ? All other components within normal limits  ?BILIRUBIN, DIRECT - Abnormal; Notable for the following components:  ? Bilirubin,  Direct 7.8 (*)   ? All other components within normal limits  ?BODY FLUID CULTURE W GRAM STAIN  ?AMMONIA  ?ETHANOL  ?LACTATE DEHYDROGENASE, PLEURAL OR PERITONEAL FLUID  ?GLUCOSE, PLEURAL OR PERITONEAL FLUID  ?PROTEIN, PLEURAL OR PERITONEAL FLUID  ?ALBUMIN, PLEURAL OR PERITONEAL FLUID   ?TROPONIN I (HIGH SENSITIVITY)  ?TROPONIN I (HIGH SENSITIVITY)  ? ? ?EKG ?None ? ?Radiology ?DG Chest 1 View ? ?Result Date: 02/23/2022 ?CLINICAL DATA:  Right thoracentesis. EXAM: CHEST  1 VIEW COMPARISON:  Radiograph earlier today FINDINGS: Decreased right pleural effusion post thoracentesis. No pneumothorax. Persistent ill-defined opacity at the periphery of the right lung base may represent small residual pleural effusion and/or atelectasis. Mild cardiomegaly. No convincing left pleural effusion. Stable osseous structures. IMPRESSION: 1. Decreased right pleural effusion post thoracentesis. No pneumothorax. 2. Persistent ill-defined opacity at the periphery of the right lung base may represent small residual pleural effusion and/or atelectasis. Electronically Signed   By: Keith Rake M.D.   On: 02/23/2022 15:25  ? ?DG Chest 2 View ? ?Result Date: 02/23/2022 ?CLINICAL DATA:  Shortness of breath EXAM: CHEST - 2 VIEW COMPARISON:  Chest x-ray dated February 06, 2022 FINDINGS: Cardiac and mediastinal contours are unchanged when compared with multiple prior exams. Moderate  right pleural effusion, increased in size when compared with prior exam. No focal consolidation. No evidence of pneumothorax. IMPRESSION: Moderate right pleural effusion, increased in size when compared with p

## 2022-02-23 NOTE — ED Triage Notes (Signed)
Pt. Stated, My Hospice nurse told me to come here if I was SOB . I have SOB and chest pain, I have cirrhosis of liver ?Hospice nurse called and stated, said she needs fluid drained from cavity and she needs to have a permanent drain put in so the nurse can drain it. ?

## 2022-02-23 NOTE — Assessment & Plan Note (Addendum)
Secondary to liver disease.  ?Drop in hemoglobin to 6.3 noted today.  No overt bleeding.  1 unit of PRBC was given prior to discharge. ?WBC and platelet counts noted to be low. ? ?

## 2022-02-23 NOTE — Assessment & Plan Note (Addendum)
Repleted.  Magnesium 1.7  ?

## 2022-02-24 DIAGNOSIS — Z515 Encounter for palliative care: Secondary | ICD-10-CM

## 2022-02-24 DIAGNOSIS — J90 Pleural effusion, not elsewhere classified: Secondary | ICD-10-CM

## 2022-02-24 DIAGNOSIS — K7031 Alcoholic cirrhosis of liver with ascites: Secondary | ICD-10-CM | POA: Diagnosis not present

## 2022-02-24 DIAGNOSIS — I8510 Secondary esophageal varices without bleeding: Secondary | ICD-10-CM

## 2022-02-24 DIAGNOSIS — K7210 Chronic hepatic failure without coma: Secondary | ICD-10-CM | POA: Diagnosis not present

## 2022-02-24 DIAGNOSIS — I959 Hypotension, unspecified: Secondary | ICD-10-CM

## 2022-02-24 DIAGNOSIS — D61818 Other pancytopenia: Secondary | ICD-10-CM

## 2022-02-24 DIAGNOSIS — R1084 Generalized abdominal pain: Secondary | ICD-10-CM | POA: Diagnosis not present

## 2022-02-24 DIAGNOSIS — I8500 Esophageal varices without bleeding: Secondary | ICD-10-CM

## 2022-02-24 DIAGNOSIS — K7030 Alcoholic cirrhosis of liver without ascites: Secondary | ICD-10-CM

## 2022-02-24 LAB — BASIC METABOLIC PANEL
Anion gap: 4 — ABNORMAL LOW (ref 5–15)
BUN: 15 mg/dL (ref 6–20)
CO2: 25 mmol/L (ref 22–32)
Calcium: 7.6 mg/dL — ABNORMAL LOW (ref 8.9–10.3)
Chloride: 101 mmol/L (ref 98–111)
Creatinine, Ser: 1.27 mg/dL — ABNORMAL HIGH (ref 0.44–1.00)
GFR, Estimated: 56 mL/min — ABNORMAL LOW (ref >60)
Glucose, Bld: 100 mg/dL — ABNORMAL HIGH (ref 70–99)
Potassium: 3.6 mmol/L (ref 3.5–5.1)
Sodium: 130 mmol/L — ABNORMAL LOW (ref 135–145)

## 2022-02-24 LAB — PREPARE RBC (CROSSMATCH)

## 2022-02-24 LAB — CBC
HCT: 20 % — ABNORMAL LOW (ref 36.0–46.0)
Hemoglobin: 6.3 g/dL — CL (ref 12.0–15.0)
MCH: 33.5 pg (ref 26.0–34.0)
MCHC: 31.5 g/dL (ref 30.0–36.0)
MCV: 106.4 fL — ABNORMAL HIGH (ref 80.0–100.0)
Platelets: 19 10*3/uL — CL (ref 150–400)
RBC: 1.88 MIL/uL — ABNORMAL LOW (ref 3.87–5.11)
RDW: 19.5 % — ABNORMAL HIGH (ref 11.5–15.5)
WBC: 1.6 10*3/uL — ABNORMAL LOW (ref 4.0–10.5)
nRBC: 0 % (ref 0.0–0.2)

## 2022-02-24 MED ORDER — SODIUM CHLORIDE 0.9% IV SOLUTION: Freq: Once | INTRAVENOUS | Status: AC

## 2022-02-24 NOTE — Progress Notes (Signed)
DISCHARGE NOTE HOME ?[name] to be discharged Home per MD order. Discussed prescriptions and follow up appointments with the patient. Prescriptions given to patient; medication list explained in detail. Patient verbalized understanding. ? ?Skin clean, dry and intact without evidence of skin break down, no evidence of skin tears noted. IV catheter discontinued intact. Site without signs and symptoms of complications. Dressing and pressure applied. Pt denies pain at the site currently. No complaints noted. ? ?Patient free of lines, drains, and wounds.  ? ?An After Visit Summary (AVS) was printed and given to the patient. ?Patient escorted via wheelchair, and discharged home via private auto. ? ?Arlyss Repress, RN  ?

## 2022-02-24 NOTE — Hospital Course (Signed)
41 y.o. 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  She was found to have moderate right pleural effusion.  She underwent thoracentesis.  Since she was complaining of abdominal pain there was concern for SBP.  Patient was hospitalized.  Patient was at home with hospice services for end-stage liver disease.   ?

## 2022-02-24 NOTE — Progress Notes (Signed)
New Admission Note:  ? ?Arrival Method: via stretcher from ED ?Mental Orientation: alert and oriented x4 ?Telemetry: N/A ?Assessment: to be completed ?Skin: refer to flowsheet ?IV:  RAC, saline locked ?Pain: site pain from thoracentesis and sternal chest pain, bearable per pt. ?Tubes: None ?Safety Measures: Safety Fall Prevention Plan has been discussed  ?Admission: to be completed ?[facility] [place] Orientation: Patient has been oriented to the room, unit and staff.   ?Family: none at bedside ? ?Orders to be reviewed and implemented. Will continue to monitor the patient. Call light has been placed within reach and bed alarm has been activated.  ? ?

## 2022-02-24 NOTE — Progress Notes (Signed)
?   02/24/22 0515  ?Assess: MEWS Score  ?Temp (!) 97.4 ?F (36.3 ?C)  ?BP (!) 76/44  ?Pulse Rate 63  ?Resp 17  ?SpO2 99 %  ?Assess: MEWS Score  ?MEWS Temp 0  ?MEWS Systolic 2  ?MEWS Pulse 0  ?MEWS RR 0  ?MEWS LOC 0  ?MEWS Score 2  ?MEWS Score Color Yellow  ?Assess: if the MEWS score is Yellow or Red  ?Were vital signs taken at a resting state? Yes  ?Focused Assessment No change from prior assessment  ?Early Detection of Sepsis Score *See Row Information* Medium  ?MEWS guidelines implemented *See Row Information* Yes  ?Treat  ?Pain Scale 0-10  ?Pain Score 8  ?Pain Type Acute pain  ?Pain Location Chest  ?Pain Orientation Right  ?Pain Descriptors / Indicators Throbbing  ?Pain Frequency Constant  ?Pain Onset On-going  ?Patients Stated Pain Goal 0  ?Pain Intervention(s) MD notified (Comment)  ?Take Vital Signs  ?Increase Vital Sign Frequency  Yellow: Q 2hr X 2 then Q 4hr X 2, if remains yellow, continue Q 4hrs  ?Escalate  ?MEWS: Escalate Yellow: discuss with charge nurse/RN and consider discussing with provider and RRT  ?Notify: Charge Nurse/RN  ?Name of Charge Nurse/RN Notified Onalee Hua., RN  ?Date Charge Nurse/RN Notified 02/24/22  ?Time Charge Nurse/RN Notified 0530  ?Notify: Provider  ?Provider Name/Title MD Bridgett Larsson  ?Date Provider Notified 02/24/22  ?Time Provider Notified [phone]  ?Notification Type Page ?(secure chat)  ?Notification Reason Other (Comment) ?(BP low)  ?Provider response No new orders  ?Date of Provider Response 02/24/22  ?Time of Provider Response [phone]  ?Document  ?Patient Outcome Not stable and remains on department  ?Progress note created (see row info) Yes  ? ? ?

## 2022-02-24 NOTE — Consult Note (Signed)
? ?                                                                                ?Consultation Note ?Date: 02/24/2022  ? ?Patient Name: Victoria Gordon  ?DOB: 12-26-1980  MRN: [id number]  Age / Sex: 41 y.o., female  ?PCP: Pcp, No ?Referring Physician: Bonnielee Haff, MD ? ?Reason for Consultation: Establishing goals of care and Inpatient hospice referral ? ?HPI/Patient Profile: 41 y.o. 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  ? ?Patient underwent thoracentesis with improvement of symptoms. Patient is enrolled in hospice at home, may be a candidate for residential hospice. PMT has been consulted to assist with goc discussion and possible referral to residential hospice facility.  ? ?Clinical Assessment and Goals of Care: ? ?I have reviewed medical records including EPIC notes, labs and imaging, assessed the patient and then met at the bedside to discuss diagnosis prognosis, GOC, EOL wishes, disposition and options. ? ?I introduced Palliative Medicine as specialized medical care for people living with serious illness. It focuses on providing relief from the symptoms and stress of a serious illness. The goal is to improve quality of life for both the patient and the family. ? ?We discussed a brief life review of the patient and then focused on their current illness. The natural disease trajectory and expectations at EOL were discussed. ? ?Discussion: ?Patient shares that she is breathing much better after thoracentesis. She does still have some pain and understands rationale for using caution with morphine (hypotensive). She tells me she would want to be at home "if anything happens"  and is eager to return home with hospice as soon as she is able. We discussed current care plan and she would like finish transfusion of pRBCs although did not notice that she was anemic. We discussed multiple causes of fatigue from her chronic comorbidities. Created space and opportunity for patient's thoughts and feelings on progression of her illness. She wishes that she could drain fluid off her lungs on her own at home, or that hospice could help with this at home. This is her most troublesome symptom. Explored patient's thoughts on the option of residential hospice with 24/7 expert care to assist with recurring pleural effusions and thoracenteses. Discussed referral process and provided reassurance that this would not affect her hospice benefits but rather would provide continual hospice support. She is interested in this, but not entirely sure at this time. She would want to discuss this further with her mother. We discussed the option to transfer to facility from the hospital and she clarifies that she wants to go home first, then continue discussing this option with family. ? ? ?The difference between aggressive medical intervention and comfort care was considered in light of the patient's goals of care. Hospice services outpatient were explained and offered.  ? ?Discussed the importance of continued conversation with family and the medical providers regarding overall plan of care and treatment  options, ensuring decisions are within the context of the patient?s values and GOCs.  ? ?Questions and concerns were addressed.  The patient was encouraged to call with questions or concerns.  PMT will continue to support holistically. ?  ? ?SUMMARY OF RECOMMENDATIONS   ?-DNR ?-Patient wishes to discharge home with hospice today, will continue discussing residential hospice facility with her family and outpatient hospice ?-PMT remains available for acute needs ? ?Prognosis:  ?Poor, approaching residential hospice  eligibility if aligned with goals of care ? ?Discharge Planning: Home with Hospice  ? ?  ? ?Primary Diagnoses: ?Present on Admission: ? Depression ? Esophageal varices in alcoholic cirrhosis (HCC) ? Pancytopenia ([place]) ? Recurrent right pleural effusion ? Hypokalemia ? Alcoholic cirrhosis of liver with ascites ([place]) ? End stage liver disease ([place]) ? ? ?I have reviewed the medical record, interviewed the patient and family, and examined the patient. The following aspects are pertinent. ? ?Past Medical History:  ?Diagnosis Date  ? Alcohol abuse   ? Alcoholic cirrhosis of liver with ascites ([place])   ? Anemia   ? Anxiety   ? Cirrhosis of liver ([place])   ? Hemorrhoid   ? Hepatitis B   ? Major depressive disorder   ? PTSD (post-traumatic stress disorder)   ? Substance abuse ([place])   ? ?Social History  ? ?Socioeconomic History  ? Marital status: Single  ?  Spouse name: Not on file  ? Number of children: Not on file  ? Years of education: Not on file  ? Highest education level: Not on file  ?Occupational History  ? Not on file  ?Tobacco Use  ? Smoking status: Never  ? Smokeless tobacco: Never  ?Vaping Use  ? Vaping Use: Never used  ?Substance and Sexual Activity  ? Alcohol use: Yes  ?  Comment: 12/03/2021  ? Drug use: No  ? Sexual activity: Yes  ?  Birth control/protection: None  ?Other Topics Concern  ? Not on file  ?Social History Narrative  ? Not on file  ? ?Social Determinants of Health  ? ?Financial Resource Strain: Not on file  ?Food Insecurity: Not on file  ?Transportation Needs: Not on file  ?Physical Activity: Not on file  ?Stress: Not on file  ?Social Connections: Not on file  ? ?Family History  ?Problem Relation Age of Onset  ? Diabetes Maternal Grandmother   ? Diabetes Paternal Grandfather   ? ?Scheduled Meds: ? lactulose  20 g Oral BID  ? midodrine  10 mg Oral TID  ? pantoprazole  40 mg Oral Daily  ? rifaximin  550 mg Oral BID  ? sodium chloride flush  3 mL Intravenous Q12H  ? ?Continuous Infusions: ? sodium chloride     ? cefTRIAXone (ROCEPHIN)  IV Stopped (02/24/22 1053)  ? ?PRN Meds:.sodium chloride, LORazepam, morphine CONCENTRATE, ondansetron, prochlorperazine, sodium chloride flush, traZODone ?Medications Prior to Admission:  ?Prior to Admission medications   ?Medication Sig Start Date End Date Taking? Authorizing Provider  ?ciprofloxacin (CIPRO) 500 MG tablet Take 1 tablet (500 mg total) by mouth daily. ?Patient taking differently: Take 500 mg by mouth daily. contineous 01/20/22  Yes Amin, Jeanella Flattery, MD  ?folic acid (FOLVITE) 1 MG tablet Take 1 mg by mouth daily.   Yes [provider]  ?furosemide (LASIX) 40 MG tablet Take 1 tablet (40 mg total) by mouth daily. ?Patient taking differently: Take 40 mg by mouth 2 (two) times daily. 01/20/22 02/23/22 Yes Amin, Jeanella Flattery, MD  ?lactulose, encephalopathy, ([place])  10 GM/15ML SOLN Take 20 g by mouth 2 (two) times daily.   Yes [provider]  ?LORazepam (ATIVAN) 0.5 MG tablet Take 0.5 mg by mouth every 4 (four) hours as needed for anxiety.   Yes [provider]  ?midodrine (PROAMATINE) 10 MG tablet Take 10 mg by mouth 3 (three) times daily.   Yes [provider]  ?morphine (ROXANOL) 20 MG/ML concentrated solution Take 5-10 mg by mouth every 2 (two) hours as needed for severe pain.   Yes [provider]  ?nitrofurantoin (MACRODANTIN) 100 MG capsule Take 100 mg by mouth 2 (two) times daily. 02/16/22  Yes [provider]  ?ondansetron (ZOFRAN) 4 MG tablet Take 1 tablet (4 mg total) by mouth every 6 (six) hours as needed for nausea. ?Patient taking differently: Take 4 mg by mouth every 6 (six) hours as needed for nausea or vomiting. 02/06/22  Yes Little Ishikawa, MD  ?OVER THE COUNTER MEDICATION Take 1 capsule by mouth daily. L-Bic Rinox - supplement   Yes [provider]  ?OVER THE COUNTER MEDICATION Take 1 capsule by mouth daily. L-Bic Clean   Yes [provider]  ?pantoprazole (PROTONIX) 40 MG tablet  Take 1 tablet (40 mg total) by mouth daily. 01/20/22 02/23/22 Yes Amin, Jeanella Flattery, MD  ?prochlorperazine (COMPAZINE) 10 MG tablet Take 10 mg by mouth every 4 (four) hours as needed for nausea or vomiting.

## 2022-02-24 NOTE — Plan of Care (Signed)

## 2022-02-24 NOTE — Progress Notes (Signed)
?  Critical Value: Hemoglobin 6.3 g/dL, platelet 19 K/uL ? ?Name of Provider Notified: MD Bridgett Larsson ? ?Orders Received? Or Actions Taken?: awaiting orders ?

## 2022-02-24 NOTE — Discharge Summary (Signed)
?[facility] ? ?Physician Discharge Summary  ? ?Patient ID: ?Victoria Gordon ?MRN: [id number] ?DOB/AGE: 05-Nov-1980 41 y.o. ? ?Admit date: 02/23/2022 ?Discharge date: 02/24/2022   ? ?PCP: Pcp, No ? ?DISCHARGE DIAGNOSES:  ?Principal Problem: ?  Alcoholic cirrhosis of liver with ascites ([place]) ?Active Problems: ?  Recurrent right pleural effusion ?  Hypokalemia ?  Pancytopenia ([place]) ?  Chronic hyponatremia ?  Esophageal varices in alcoholic cirrhosis (HCC) ?  Depression ?  Hospice care patient ?  End stage liver disease ([place]) ? ? ?RECOMMENDATIONS FOR OUTPATIENT FOLLOW UP: ?Hospice to continue to follow at home  ? ? ?CODE STATUS: DNR ? ?DISCHARGE CONDITION: stable ? ?Diet recommendation: As before ? ?INITIAL HISTORY: ?41 y.o. 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  She was found to have moderate right pleural effusion.  She underwent thoracentesis.  Since she was complaining of abdominal pain there was concern for SBP.  Patient was hospitalized.  Patient was at home with hospice services for end-stage liver disease.   ? ?Consultations: ?Palliative care ? ?Procedures: ?Thoracentesis ?Attempt at paracentesis ? ? ?HOSPITAL COURSE:  ? ? ?* Alcoholic cirrhosis of liver with ascites ([place]) ?She has end-stage liver disease.  Deemed not a transplant candidate by [facility] transplant team and subsequently has been on hospice care. ?Initially there was concern for SBP but not much ascites was noted by IR when they attempted paracentesis. ?Hypotension is due to her end-stage liver disease.  Continue midodrine. ?Patient was under hospice care at home.  Due to  hypotension her diuretics are on hold. ?Continue lactulose, rifaximin, morphine and anti-emetics  ?Seen by palliative care.  After long discussion patient expressed wish to go home to be with her family for now.  She will decide on residential hospice at a later date.  In view of this it was felt that she could be discharged home after her blood transfusion. ? ?Recurrent right pleural effusion ?S/p IR thoracentesis with removal of 2.4L.  Dyspnea has improved. ? ?Hypokalemia ?Repleted.  Magnesium 1.7  ? ?Pancytopenia ([place]) ?Secondary to liver disease.  ?Drop in hemoglobin to 6.3 noted today.  No overt bleeding.  1 unit of PRBC was given prior to discharge. ?WBC and platelet counts noted to be low. ? ? ?Chronic hyponatremia ?Stable ? ?Esophageal varices in alcoholic cirrhosis (HCC) ?Continue PPI  ? ?Depression ?Continue trazodone and valium prn  ? ? ?Obesity ?Estimated body mass index is 36.74 kg/m? as calculated from the following: ?  Height as of this encounter: '5\' 1"'$  (1.549 m). ?  Weight as of this encounter: 88.2 kg. ? ?Patient's prognosis is poor.  Palliative care was involved.  Patient expressed wish to go home after transfusion.  This was thought to be reasonable.  Hospice to follow at home.  Transition to residential hospice depending on patient's condition in the next few days. ? ? ?PERTINENT LABS: ? ?The results of significant diagnostics from this hospitalization (including imaging, microbiology, ancillary and laboratory) are listed below for reference.   ? ?Microbiology: ?Recent Results (from the past 240 hour(s))  ?Culture, blood (Routine X 2) w Reflex to ID Panel     Status: None (Preliminary result)  ? Collection Time: 02/24/22  2:17 AM  ?  Specimen: BLOOD  ?Result Value Ref Range Status  ? Specimen Description BLOOD LEFT ANTECUBITAL  Final  ? Special Requests   Final  ?  BOTTLES DRAWN AEROBIC AND ANAEROBIC Blood Culture adequate volume  ? Culture   Final  ?  NO GROWTH 1 DAY ?Performed at [facility], [place] [address] [place], [place] [address] ?  ? Report Status PENDING  Incomplete  ?Culture, blood (Routine X 2) w Reflex to ID Panel     Status: None (Preliminary result)  ? Collection Time: 02/24/22  2:25 AM  ? Specimen: BLOOD  ?Result Value Ref Range Status  ? Specimen Description BLOOD RIGHT ANTECUBITAL  Final  ? Special Requests AEROBIC BOTTLE ONLY Blood Culture adequate volume  Final  ? Culture   Final  ?  NO GROWTH 1 DAY ?Performed at [facility], [place] [address], [place], [place] [address] ?  ? Report Status PENDING  Incomplete  ?  ? ?Labs: ? ?COVID-19 Labs ? ? ?Lab Results  ?Component Value Date  ? [place] NEGATIVE 01/17/2022  ? [place] NEGATIVE 11/05/2021  ? [place] NEGATIVE 11/01/2021  ? [place] NEGATIVE 08/20/2021  ? ? ? ? ?Basic Metabolic Panel: ?Recent Labs  ?Lab 02/23/22 ?1055 02/23/22 ?1237 02/24/22 ?0225  ?NA 130*  --  130*  ?K 3.3*  --  3.6  ?CL 100  --  101  ?CO2 22  --  25  ?GLUCOSE 101*  --  100*  ?BUN 14  --  15  ?CREATININE 1.08*  --  1.27*  ?CALCIUM 8.0*  --  7.6*  ?MG  --  1.7  --   ? ?Liver Function Tests: ?Recent Labs  ?Lab 02/23/22 ?1055  ?AST 73*  ?ALT 31  ?ALKPHOS 102  ?BILITOT 24.0*  ?PROT 5.7*  ?ALBUMIN 2.0*  ? ? ?Recent Labs  ?Lab 02/23/22 ?1055  ?AMMONIA 25  ? ?CBC: ?Recent Labs  ?Lab 02/23/22 ?1055 02/24/22 ?0225  ?WBC 3.0* 1.6*  ?NEUTROABS 1.6*  --   ?HGB 8.3* 6.3*  ?HCT 26.7* 20.0*  ?MCV 108.5* 106.4*  ?PLT 32* 19*  ? ? ? ?IMAGING STUDIES ?DG Chest 1 View ? ?Result Date: 02/23/2022 ?CLINICAL DATA:  Right thoracentesis. EXAM: CHEST  1 VIEW COMPARISON:  Radiograph earlier today FINDINGS: Decreased right pleural effusion post thoracentesis. No pneumothorax. Persistent ill-defined opacity at the periphery of the right lung base may represent small residual pleural effusion and/or atelectasis. Mild cardiomegaly. No convincing left pleural effusion. Stable osseous structures. IMPRESSION: 1. Decreased right pleural effusion post thoracentesis. No  pneumothorax. 2. Persistent ill-defined opacity at the periphery of the right lung base may represent small residual pleural effusion and/or atelectasis. Electronically Signed   By: Keith Rake M.D.   On: 02/23/2022 15:25  ? ?DG Chest 2 View ? ?Result Date: 02/23/2022 ?CLINICAL DATA:  Shortness of breath EXAM: CHEST - 2 VIEW COMPARISON:  Chest x-ray dated February 06, 2022 FINDINGS: Cardiac and mediastinal contours are unchanged when compared with multiple prior exams. Moderate right pleural effusion, increased in size when compared with prior exam. No focal consolidation. No evidence of pneumothorax. IMPRESSION: Moderate right pleural effusion, increased in size when compared with prior exam. Electronically Signed   By: Yetta Glassman M.D.   On: 02/23/2022 11:29  ? ?IR ABDOMEN US LIMITED ? ?Result Date: 02/23/2022 ?CLINICAL DATA:  41 year old female with history of cirrhosis and end-stage liver disease with abdominal swelling. EXAM: LIMITED ABDOMEN ULTRASOUND FOR ASCITES TECHNIQUE: Limited ultrasound survey for ascites was  performed in all four abdominal quadrants. COMPARISON:  CT chest abdomen pelvis from 11/05/2021 FINDINGS: Trace ascites is visualized deep within the left lower quadrant. Diffuse anterior abdominal wall anasarca is noted. IMPRESSION: Trace left lower quadrant ascites, too deep and limited volume for attempted paracentesis. Ruthann Cancer, MD [facility] Electronically Signed   By: Ruthann Cancer M.D.   On: 02/23/2022 16:36  ? ?IR THORACENTESIS ASP PLEURAL SPACE W/IMG GUIDE ? ?Result Date: 02/23/2022 ?INDICATION: Shortness of breath, recurrent pleural effusion, history of alcoholic cirrhosis EXAM: ULTRASOUND GUIDED RIGHT THORACENTESIS MEDICATIONS: None. COMPLICATIONS: None immediate. PROCEDURE: An ultrasound guided thoracentesis was thoroughly discussed with the patient and questions answered. The benefits, risks, alternatives and  complications were also discussed. The patient understands and wishes to proceed with the procedure. Written consent was obtained. Ultrasound was performed to localize and mark an adequate pocket of fluid in the right chest.

## 2022-02-24 NOTE — Progress Notes (Signed)
Pt. c/o of right back/chest pain throbbing in nature, asking for pain medication, unable to give morphine IV due to BP low. MD Bridgett Larsson aware. Per MD Bridgett Larsson no intervention at this time ?

## 2022-02-24 NOTE — Progress Notes (Signed)
? ?[facility] ?PROGRESS NOTE ? ? ?Shaley Leavens BWG:[id number] DOB: 06/01/1981 DOA: 02/23/2022  0 ?DOS: the patient was seen and examined on 02/24/2022 ? ?PCP: Pcp, No ? ?Brief History and Hospital Course:  ?41 y.o. 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  She was found to have moderate right pleural effusion.  She underwent thoracentesis.  Since she was complaining of abdominal pain there was concern for SBP.  Patient was hospitalized.  Patient was at home with hospice services for end-stage liver disease.   ? ?Consultants: Palliative care ? ?Procedures: Thoracentesis ? ? ? ?Subjective: ?Patient complains of feeling fatigued.  Complains of pain in her chest and abdomen. ? ? ? ?Assessment/Plan: ? ? ?* Alcoholic cirrhosis of liver with ascites ([place]) ?She has end-stage liver disease.  Deemed not a transplant candidate by [facility] transplant team and subsequently has been on hospice care. ?Initially there was concern for SBP but not much ascites was noted by IR when they attempted paracentesis. ?Hypotension is due to her end-stage liver disease.  Continue midodrine. ?Patient was under hospice care at home.  Due to hypotension her diuretics are on hold. ?Palliative care has been consulted as patient may be progressing to the point where she may need to go to residential hospice. ?Continue lactulose, rifaximin, morphine and anti-emetics  ?Continue with pain medications for her discomfort in the abdomen and chest. ? ?Recurrent right pleural effusion ?S/p IR thoracentesis with removal of 2.4L.  Dyspnea has improved. ? ?Hypokalemia ?Repleted.   Magnesium 1.7  ? ?Pancytopenia ([place]) ?Secondary to liver disease.  ?Drop in hemoglobin to 6.3 noted today.  No overt bleeding.  Blood transfusion ordered.  She has antibodies and so there might be some delay in obtaining blood.  There is no emergent situation at this time. ?WBC and platelet counts noted to be low. ? ? ?Chronic hyponatremia ?Stable ? ?Esophageal varices in alcoholic cirrhosis (HCC) ?Continue PPI  ? ?Depression ?Continue trazodone and valium prn  ? ? ?Obesity ?Estimated body mass index is 36.74 kg/m? as calculated from the following: ?  Height as of this encounter: '5\' 1"'$  (1.549 m). ?  Weight as of this encounter: 88.2 kg. ? ? ?DVT Prophylaxis: Auto anticoagulated with INR of 2.9 ?Code Status: DNR ?Family Communication: Discussed with patient ?Disposition Plan: To be determined ? ?Status is: Observation ?The patient will require care spanning > 2 midnights and should be moved to inpatient because: Requires blood transfusion, disposition is not clear. ? ? ? ? ?Medications: Scheduled: ? sodium chloride   Intravenous Once  ? lactulose  20 g Oral BID  ? midodrine  10 mg Oral TID  ? pantoprazole  40 mg Oral Daily  ? rifaximin  550 mg Oral BID  ? sodium chloride flush  3 mL Intravenous Q12H  ? ?Continuous: ? sodium chloride    ? cefTRIAXone (ROCEPHIN)  IV 2 g (02/24/22 1014)  ? ?VXB:LTJQZE chloride, LORazepam, morphine CONCENTRATE, ondansetron, prochlorperazine, sodium chloride flush, traZODone ? ?Antibiotics: ?Anti-infectives (From admission, onward)  ? ? Start     Dose/Rate Route Frequency Ordered Stop  ? 02/24/22 1000  cefTRIAXone (ROCEPHIN) 2 g in sodium chloride 0.9 % 100 mL IVPB       ?  2 g ?200 mL/hr over 30 Minutes Intravenous Every 24 hours 02/23/22 1833    ? 02/23/22 2200  cefTRIAXone (ROCEPHIN) 2 g in sodium chloride 0.9 % 100 mL IVPB  Status:  Discontinued       ? 2 g ?200 mL/hr over 30 Minutes Intravenous Every 8 hours 02/23/22 1813 02/23/22 1833  ? 02/23/22 2200  rifaximin (XIFAXAN) tablet 550  mg       ? 550 mg Oral 2 times daily 02/23/22 2035    ? 02/23/22 1100  cefTRIAXone (ROCEPHIN) 1 g in sodium chloride 0.9 % 100 mL IVPB       ? 1 g ?200 mL/hr over 30 Minutes Intravenous  Once 02/23/22 1059 02/23/22 1159  ? ?  ? ? ?Objective: ? ?Vital Signs ? ?Vitals:  ? 02/24/22 0529 02/24/22 0532 02/24/22 0743 02/24/22 1004  ?BP: (!) 80/44 (!) 76/40 (!) 81/53 (!) 90/59  ?Pulse:   71   ?Resp:   16   ?Temp:   97.9 ?F (36.6 ?C)   ?TempSrc:   Oral   ?SpO2:   97%   ?Weight:      ?Height:      ? ? ?Intake/Output Summary (Last 24 hours) at 02/24/2022 1101 ?Last data filed at 02/24/2022 0138 ?Gross per 24 hour  ?Intake 120 ml  ?Output --  ?Net 120 ml  ? ?Filed Weights  ? 02/24/22 0515  ?Weight: 88.2 kg  ? ? ?General appearance: Awake alert.  In no distress ?Resp: Clear to auscultation bilaterally.  Normal effort ?Cardio: S1-S2 is normal regular.  No S3-S4.  No rubs murmurs or bruit ?GI: Abdomen is soft.  Tender diffusely.  Bowel sounds are present normal.  No masses organomegaly ? ? ? ?Lab Results: ? ?Data Reviewed: I have personally reviewed labs and imaging study reports ? ?CBC: ?Recent Labs  ?Lab 02/23/22 ?1055 02/24/22 ?0225  ?WBC 3.0* 1.6*  ?NEUTROABS 1.6*  --   ?HGB 8.3* 6.3*  ?HCT 26.7* 20.0*  ?MCV 108.5* 106.4*  ?PLT 32* 19*  ? ? ?Basic Metabolic Panel: ?Recent Labs  ?Lab 02/23/22 ?1055 02/23/22 ?1237 02/24/22 ?0225  ?NA 130*  --  130*  ?K 3.3*  --  3.6  ?CL 100  --  101  ?CO2 22  --  25  ?GLUCOSE 101*  --  100*  ?BUN 14  --  15  ?CREATININE 1.08*  --  1.27*  ?CALCIUM 8.0*  --  7.6*  ?MG  --  1.7  --   ? ? ?GFR: ?Estimated Creatinine Clearance: 60.7 mL/min (A) (by C-G formula based on SCr of 1.27 mg/dL (H)). ? ?Liver Function Tests: ?Recent Labs  ?Lab 02/23/22 ?1055  ?AST 73*  ?ALT 31  ?ALKPHOS 102  ?BILITOT 24.0*  ?PROT 5.7*  ?ALBUMIN 2.0*  ? ? ?Recent Labs  ?Lab 02/23/22 ?1055  ?AMMONIA 25  ? ? ?Coagulation Profile: ?Recent Labs  ?Lab 02/23/22 ?1055  ?INR 2.9*  ? ? ? ?Radiology Studies: ?DG Chest 1 View ? ?Result  Date: 02/23/2022 ?CLINICAL DATA:  Right thoracentesis. EXAM: CHEST  1 VIEW COMPARISON:  Radiograph earlier today FINDINGS: Decreased right pleural effusion post thoracentesis. No pneumothorax. Persistent ill-defined opacity at the periphery of the right lung base may represent small residual pleural effusion and/or atelectasis. Mild cardiomegaly. No convincing left pleural effusion. Stable osseous structures. IMPRESSION: 1. Decreased right pleural effusion post thoracentesis. No pneumothorax. 2. Persistent ill-defined opacity at the periphery of the right lung base may represent small residual pleural effusion and/or  atelectasis. Electronically Signed   By: Keith Rake M.D.   On: 02/23/2022 15:25  ? ?DG Chest 2 View ? ?Result Date: 02/23/2022 ?CLINICAL DATA:  Shortness of breath EXAM: CHEST - 2 VIEW COMPARISON:  Chest x-ray dated February 06, 2022 FINDINGS: Cardiac and mediastinal contours are unchanged when compared with multiple prior exams. Moderate right pleural effusion, increased in size when compared with prior exam. No focal consolidation. No evidence of pneumothorax. IMPRESSION: Moderate right pleural effusion, increased in size when compared with prior exam. Electronically Signed   By: Yetta Glassman M.D.   On: 02/23/2022 11:29  ? ?IR ABDOMEN US LIMITED ? ?Result Date: 02/23/2022 ?CLINICAL DATA:  41 year old female with history of cirrhosis and end-stage liver disease with abdominal swelling. EXAM: LIMITED ABDOMEN ULTRASOUND FOR ASCITES TECHNIQUE: Limited ultrasound survey for ascites was performed in all four abdominal quadrants. COMPARISON:  CT chest abdomen pelvis from 11/05/2021 FINDINGS: Trace ascites is visualized deep within the left lower quadrant. Diffuse anterior abdominal wall anasarca is noted. IMPRESSION: Trace left lower quadrant ascites, too deep and limited volume for attempted paracentesis. Ruthann Cancer, MD [facility]  Electronically Signed   By: Ruthann Cancer M.D.   On: 02/23/2022 16:36  ? ?IR THORACENTESIS ASP PLEURAL SPACE W/IMG GUIDE ? ?Result Date: 02/23/2022 ?INDICATION: Shortness of breath, recurrent pleural effusion, histor

## 2022-02-24 NOTE — TOC Transition Note (Signed)
Transition of Care (TOC) - CM/SW Discharge Note ? ? ?Patient Details  ?Name: Merced Brougham ?MRN: [id number] ?Date of Birth: 02/07/1981 ? ?Transition of Care (TOC) CM/SW Contact:  ?Tom-Johnson, Renea Ee, RN ?Phone Number: ?02/24/2022, 3:21 PM ? ? ?Clinical Narrative:    ? ?Patient is scheduled for discharge today. Admitted with Alcoholic Cirrhosis of Liver with Ascites. Patient underwent thoracentesis. From home with Hospice care. [facility] will resume care at discharge. No TOC needs noted. CM notified Cassandra at [place] of discharge. Patient's mom to transport. No further TOC needs noted.  ? ?  ?Final next level of care: [place] ?Barriers to Discharge: Barriers Resolved ? ? ?Patient Goals and CMS Choice ?Patient states their goals for this hospitalization and ongoing recovery are:: To return home with Hospice ?CMS Medicare.gov Compare Post Acute Care list provided to:: Patient ?Choice offered to / list presented to : Patient ? ?Discharge Placement ?  ?           ?  ?Patient to be transferred to facility by: Mom ?  ?  ? ?Discharge Plan and Services ?  ?  ?           ?  ?  ?  ?  ?  ?  ?[place] Agency: Other - See comment ([facility]) ?  ?  ?  ? ?Social Determinants of Health (SDOH) Interventions ?  ? ? ?Readmission Risk Interventions ?   ? View : No data to display.  ?  ?  ?  ? ? ? ? ? ?

## 2022-02-25 DIAGNOSIS — R1084 Generalized abdominal pain: Secondary | ICD-10-CM

## 2022-02-28 LAB — BPAM RBC
Blood Product Expiration Date: 202305132359
Blood Product Expiration Date: 202305162359
ISSUE DATE / TIME: 202304271312
Unit Type and Rh: 5100
Unit Type and Rh: 5100

## 2022-02-28 LAB — TYPE AND SCREEN
ABO/RH(D): O POS
Antibody Screen: POSITIVE
Donor AG Type: NEGATIVE
Donor AG Type: NEGATIVE
Unit division: 0
Unit division: 0

## 2022-03-03 LAB — CULTURE, BLOOD (ROUTINE X 2)
Culture: NO GROWTH
Culture: NO GROWTH
Special Requests: ADEQUATE
Special Requests: ADEQUATE

## 2022-03-10 ENCOUNTER — Inpatient Hospital Stay (HOSPITAL_COMMUNITY)
Admission: EM | Admit: 2022-03-10 | Discharge: 2022-03-12 | DRG: 641 | Disposition: A | Discharge status: Hospice | Payer: Medicaid Other | Attending: Family Medicine | Admitting: Family Medicine

## 2022-03-10 ENCOUNTER — Other Ambulatory Visit: Payer: Self-pay

## 2022-03-10 ENCOUNTER — Encounter (HOSPITAL_COMMUNITY): Payer: Self-pay | Admitting: *Deleted

## 2022-03-10 ENCOUNTER — Emergency Department (HOSPITAL_COMMUNITY): Payer: Medicaid Other

## 2022-03-10 DIAGNOSIS — D61818 Other pancytopenia: Secondary | ICD-10-CM | POA: Diagnosis present

## 2022-03-10 DIAGNOSIS — E8770 Fluid overload, unspecified: Secondary | ICD-10-CM | POA: Diagnosis present

## 2022-03-10 DIAGNOSIS — Z515 Encounter for palliative care: Secondary | ICD-10-CM

## 2022-03-10 DIAGNOSIS — E669 Obesity, unspecified: Secondary | ICD-10-CM | POA: Diagnosis present

## 2022-03-10 DIAGNOSIS — Z9049 Acquired absence of other specified parts of digestive tract: Secondary | ICD-10-CM

## 2022-03-10 DIAGNOSIS — K3189 Other diseases of stomach and duodenum: Secondary | ICD-10-CM | POA: Diagnosis present

## 2022-03-10 DIAGNOSIS — R0600 Dyspnea, unspecified: Principal | ICD-10-CM

## 2022-03-10 DIAGNOSIS — D649 Anemia, unspecified: Secondary | ICD-10-CM | POA: Diagnosis present

## 2022-03-10 DIAGNOSIS — F4310 Post-traumatic stress disorder, unspecified: Secondary | ICD-10-CM | POA: Diagnosis present

## 2022-03-10 DIAGNOSIS — F1020 Alcohol dependence, uncomplicated: Secondary | ICD-10-CM | POA: Diagnosis present

## 2022-03-10 DIAGNOSIS — F419 Anxiety disorder, unspecified: Secondary | ICD-10-CM | POA: Diagnosis present

## 2022-03-10 DIAGNOSIS — E8809 Other disorders of plasma-protein metabolism, not elsewhere classified: Secondary | ICD-10-CM | POA: Diagnosis present

## 2022-03-10 DIAGNOSIS — K766 Portal hypertension: Secondary | ICD-10-CM | POA: Diagnosis present

## 2022-03-10 DIAGNOSIS — Z66 Do not resuscitate: Secondary | ICD-10-CM | POA: Diagnosis present

## 2022-03-10 DIAGNOSIS — Z8619 Personal history of other infectious and parasitic diseases: Secondary | ICD-10-CM | POA: Diagnosis not present

## 2022-03-10 DIAGNOSIS — K760 Fatty (change of) liver, not elsewhere classified: Secondary | ICD-10-CM | POA: Diagnosis present

## 2022-03-10 DIAGNOSIS — Z6836 Body mass index (BMI) 36.0-36.9, adult: Secondary | ICD-10-CM | POA: Diagnosis not present

## 2022-03-10 DIAGNOSIS — Z79899 Other long term (current) drug therapy: Secondary | ICD-10-CM

## 2022-03-10 DIAGNOSIS — E871 Hypo-osmolality and hyponatremia: Principal | ICD-10-CM | POA: Diagnosis present

## 2022-03-10 DIAGNOSIS — K709 Alcoholic liver disease, unspecified: Secondary | ICD-10-CM | POA: Diagnosis not present

## 2022-03-10 DIAGNOSIS — Z888 Allergy status to other drugs, medicaments and biological substances status: Secondary | ICD-10-CM

## 2022-03-10 DIAGNOSIS — I8510 Secondary esophageal varices without bleeding: Secondary | ICD-10-CM | POA: Diagnosis present

## 2022-03-10 DIAGNOSIS — F32A Depression, unspecified: Secondary | ICD-10-CM | POA: Diagnosis present

## 2022-03-10 DIAGNOSIS — D689 Coagulation defect, unspecified: Secondary | ICD-10-CM | POA: Diagnosis present

## 2022-03-10 DIAGNOSIS — K7031 Alcoholic cirrhosis of liver with ascites: Secondary | ICD-10-CM | POA: Diagnosis present

## 2022-03-10 DIAGNOSIS — J90 Pleural effusion, not elsewhere classified: Secondary | ICD-10-CM | POA: Diagnosis present

## 2022-03-10 DIAGNOSIS — Z833 Family history of diabetes mellitus: Secondary | ICD-10-CM | POA: Diagnosis not present

## 2022-03-10 DIAGNOSIS — K7210 Chronic hepatic failure without coma: Secondary | ICD-10-CM | POA: Diagnosis present

## 2022-03-10 DIAGNOSIS — K7030 Alcoholic cirrhosis of liver without ascites: Secondary | ICD-10-CM

## 2022-03-10 LAB — CBC WITH DIFFERENTIAL/PLATELET
Abs Immature Granulocytes: 0.01 10*3/uL (ref 0.00–0.07)
Basophils Absolute: 0 10*3/uL (ref 0.0–0.1)
Basophils Relative: 0 %
Eosinophils Absolute: 0.1 10*3/uL (ref 0.0–0.5)
Eosinophils Relative: 2 %
HCT: 21.1 % — ABNORMAL LOW (ref 36.0–46.0)
Hemoglobin: 7.2 g/dL — ABNORMAL LOW (ref 12.0–15.0)
Immature Granulocytes: 0 %
Lymphocytes Relative: 11 %
Lymphs Abs: 0.4 10*3/uL — ABNORMAL LOW (ref 0.7–4.0)
MCH: 34.3 pg — ABNORMAL HIGH (ref 26.0–34.0)
MCHC: 34.1 g/dL (ref 30.0–36.0)
MCV: 100.5 fL — ABNORMAL HIGH (ref 80.0–100.0)
Monocytes Absolute: 0.4 10*3/uL (ref 0.1–1.0)
Monocytes Relative: 12 %
Neutro Abs: 2.8 10*3/uL (ref 1.7–7.7)
Neutrophils Relative %: 75 %
Platelets: 55 10*3/uL — ABNORMAL LOW (ref 150–400)
RBC: 2.1 MIL/uL — ABNORMAL LOW (ref 3.87–5.11)
RDW: 18.5 % — ABNORMAL HIGH (ref 11.5–15.5)
WBC: 3.7 10*3/uL — ABNORMAL LOW (ref 4.0–10.5)
nRBC: 0 % (ref 0.0–0.2)

## 2022-03-10 LAB — COMPREHENSIVE METABOLIC PANEL
ALT: 31 U/L (ref 0–44)
AST: 73 U/L — ABNORMAL HIGH (ref 15–41)
Albumin: 2 g/dL — ABNORMAL LOW (ref 3.5–5.0)
Alkaline Phosphatase: 119 U/L (ref 38–126)
Anion gap: 8 (ref 5–15)
BUN: 18 mg/dL (ref 6–20)
CO2: 18 mmol/L — ABNORMAL LOW (ref 22–32)
Calcium: 7.9 mg/dL — ABNORMAL LOW (ref 8.9–10.3)
Chloride: 90 mmol/L — ABNORMAL LOW (ref 98–111)
Creatinine, Ser: 0.99 mg/dL (ref 0.44–1.00)
GFR, Estimated: >60 mL/min (ref >60)
Glucose, Bld: 104 mg/dL — ABNORMAL HIGH (ref 70–99)
Potassium: 4.9 mmol/L (ref 3.5–5.1)
Sodium: 116 mmol/L — CL (ref 135–145)
Total Bilirubin: 18.1 mg/dL (ref 0.3–1.2)
Total Protein: 5.3 g/dL — ABNORMAL LOW (ref 6.5–8.1)

## 2022-03-10 LAB — PROTIME-INR
INR: 3 — ABNORMAL HIGH (ref 0.8–1.2)
Prothrombin Time: 31.3 seconds — ABNORMAL HIGH (ref 11.4–15.2)

## 2022-03-10 LAB — LIPASE, BLOOD: Lipase: 50 U/L (ref 11–51)

## 2022-03-10 MED ORDER — PANTOPRAZOLE SODIUM 40 MG PO TBEC
40.0000 mg | DELAYED_RELEASE_TABLET | Freq: Every day | ORAL | Status: DC
  Administered 2022-03-10 – 2022-03-12 (×3): 40 mg via ORAL
  Filled 2022-03-10 (×3): qty 1

## 2022-03-10 MED ORDER — MORPHINE SULFATE (PF) 2 MG/ML IV SOLN
2.0000 mg | INTRAVENOUS | Status: DC | PRN
  Administered 2022-03-10 (×2): 2 mg via INTRAVENOUS
  Filled 2022-03-10: qty 1

## 2022-03-10 MED ORDER — FOLIC ACID 1 MG PO TABS
1.0000 mg | ORAL_TABLET | Freq: Every day | ORAL | Status: DC
  Administered 2022-03-10 – 2022-03-12 (×3): 1 mg via ORAL
  Filled 2022-03-10 (×3): qty 1

## 2022-03-10 MED ORDER — MORPHINE SULFATE (CONCENTRATE) 10 MG/0.5ML PO SOLN
5.0000 mg | ORAL | Status: DC | PRN
  Administered 2022-03-11 (×4): 10 mg via ORAL
  Filled 2022-03-10 (×4): qty 0.5

## 2022-03-10 MED ORDER — LORAZEPAM 0.5 MG PO TABS
0.5000 mg | ORAL_TABLET | ORAL | Status: DC | PRN
  Administered 2022-03-10 – 2022-03-12 (×8): 0.5 mg via ORAL
  Filled 2022-03-10 (×6): qty 1

## 2022-03-10 MED ORDER — ONDANSETRON HCL 4 MG/2ML IJ SOLN
4.0000 mg | Freq: Four times a day (QID) | INTRAMUSCULAR | Status: DC | PRN
  Administered 2022-03-10: 4 mg via INTRAVENOUS

## 2022-03-10 MED ORDER — ALBUMIN HUMAN 25 % IV SOLN
25.0000 g | Freq: Four times a day (QID) | INTRAVENOUS | Status: DC
  Administered 2022-03-10 – 2022-03-11 (×3): 25 g via INTRAVENOUS
  Filled 2022-03-10 (×7): qty 100

## 2022-03-10 MED ORDER — SODIUM CHLORIDE 0.9 % IV BOLUS
250.0000 mL | Freq: Once | INTRAVENOUS | Status: AC
  Administered 2022-03-10: 250 mL via INTRAVENOUS

## 2022-03-10 MED ORDER — FUROSEMIDE 40 MG PO TABS
40.0000 mg | ORAL_TABLET | Freq: Two times a day (BID) | ORAL | Status: DC
  Administered 2022-03-11 – 2022-03-12 (×3): 40 mg via ORAL
  Filled 2022-03-10 (×3): qty 1

## 2022-03-10 MED ORDER — MIDODRINE HCL 5 MG PO TABS
10.0000 mg | ORAL_TABLET | Freq: Three times a day (TID) | ORAL | Status: DC
Start: 2022-03-11 — End: 2022-03-12
  Administered 2022-03-11 – 2022-03-12 (×5): 10 mg via ORAL
  Filled 2022-03-10 (×5): qty 2

## 2022-03-10 MED ORDER — SPIRONOLACTONE 25 MG PO TABS
50.0000 mg | ORAL_TABLET | Freq: Every day | ORAL | Status: DC
  Administered 2022-03-10 – 2022-03-11 (×2): 50 mg via ORAL
  Filled 2022-03-10 (×2): qty 2

## 2022-03-10 MED ORDER — LACTULOSE 10 GM/15ML PO SOLN
20.0000 g | Freq: Two times a day (BID) | ORAL | Status: DC
  Administered 2022-03-11 – 2022-03-12 (×3): 20 g via ORAL
  Filled 2022-03-10 (×11): qty 30

## 2022-03-10 MED ORDER — RIFAXIMIN 550 MG PO TABS
550.0000 mg | ORAL_TABLET | Freq: Two times a day (BID) | ORAL | Status: DC
  Administered 2022-03-10 – 2022-03-12 (×4): 550 mg via ORAL
  Filled 2022-03-10 (×4): qty 1

## 2022-03-10 NOTE — ED Triage Notes (Signed)
Pt c/o sob with chest pain and back pain x one week ?

## 2022-03-10 NOTE — H&P (Signed)
? ?                                                                                                       [facility] H&P ? ? Patient Demographics:  ? ? Victoria Gordon, is a 41 y.o. female  MRN: [id number]   DOB - June 21, 1981 ? ?Admit Date - 03/10/2022 ? ?Outpatient Primary MD for the patient is Pcp, No ? ?Referring MD/NP/PA: Dr Roderic Palau ? ?Patient coming from: home ? ?Chief Complaint  ?Patient presents with  ? Shortness of Breath  ?  ? ? HPI:  ? ? Victoria Gordon  is a 41 y.o. 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, portal hypertensive gastropathy, recurrent right pleural effusion, pancytopenia, chronic hyponatremia, PTSD, anxiety, substance use disorder with alcohol abuse(she stopped drinking since her recent discharge on 02/24/2022), patient with recent hospitalization due to alcoholic cirrhosis, ascites, pleural effusion, status postthoracentesis, ATTEMPT at paracentesis, she was discharged home with hospice, since to ED secondary to shortness of breath, abdominal discomfort secondary to increased abdominal girth from ascites, patient is followed by hospice, patient reports her hospice nurse told her she will increase her morphine, but patient reports that they have not done that yet, given her dyspnea she presents to ED. ?- in ED work-up significant for recurrent right pleural effusion, ascites, sodium of 116, total bilirubin of 18, white blood cell count of 3.7, hemoglobin of 7.2, platelet count of 55 K,given  her symptomatic dyspnea, and need for thoracentesis and paracentesis. [facility]ospital[facility] consulted to admit ? ? ? Review of systems:  ? ?A full 10 point Review of Systems was done, except as stated above, all other Review of Systems were negative. ? ? ?With Past History of the following :  ? ? ?Past Medical History:  ?Diagnosis Date  ? Alcohol abuse   ? Alcoholic cirrhosis of liver with ascites ([place])    ? Anemia   ? Anxiety   ? Cirrhosis of liver ([place])   ? Hemorrhoid   ? Hepatitis B   ? Major depressive disorder   ? PTSD (post-traumatic stress disorder)   ? Substance abuse ([place])   ?   ? ?Past Surgical History:  ?Procedure Laterality Date  ? BIOPSY  08/29/2021  ? Procedure: BIOPSY;  Surgeon: Daryel November, MD;  Location: [place] [facility];  Service: Gastroenterology;;  ? CESAREAN SECTION    ? CHOLECYSTECTOMY    ? COLONOSCOPY WITH PROPOFOL N/A 08/29/2021  ? Procedure: COLONOSCOPY WITH PROPOFOL;  Surgeon: Daryel November, MD;  Location: [place];  Service: Gastroenterology;  Laterality: N/A;  ? ESOPHAGOGASTRODUODENOSCOPY (EGD) WITH PROPOFOL N/A 08/29/2021  ? Procedure: ESOPHAGOGASTRODUODENOSCOPY (EGD) WITH PROPOFOL;  Surgeon: Daryel November, MD;  Location: [place];  Service: Gastroenterology;  Laterality: N/A;  ? IR THORACENTESIS ASP PLEURAL SPACE W/IMG GUIDE  11/23/2021  ? IR THORACENTESIS ASP PLEURAL SPACE W/IMG GUIDE  02/23/2022  ? MOUTH SURGERY    ? TUBAL LIGATION Bilateral 07/22/2014  ? Procedure: POST PARTUM TUBAL LIGATION;  Surgeon: Woodroe Mode, MD;  Location: [facility];  Service: Gynecology;  Laterality: Bilateral;  ? ? ? ? Social History:  ? ?  ?Social History  ? ?Tobacco Use  ? Smoking status: Never  ? Smokeless tobacco: Never  ?Substance Use Topics  ? Alcohol use: Yes  ?  Comment: 12/03/2021  ?  ? ? Family History :  ? ?  ?Family History  ?Problem Relation Age of Onset  ? Diabetes Maternal Grandmother   ? Diabetes Paternal Grandfather   ? ? ? ? Home Medications:  ? ?Prior to Admission medications   ?Medication Sig Start Date End Date Taking? Authorizing Provider  ?ciprofloxacin (CIPRO) 500 MG tablet Take 1 tablet (500 mg total) by mouth daily. ?Patient taking differently: Take 500 mg by mouth daily. contineous 01/20/22   Amin, Jeanella Flattery, MD  ?folic acid (FOLVITE) 1 MG tablet Take 1 mg by mouth daily.    [provider]  ?furosemide (LASIX) 40 MG tablet Take 1 tablet (40 mg total)  by mouth daily. ?Patient taking differently: Take 40 mg by mouth 2 (two) times daily. 01/20/22 02/23/22  Amin, Jeanella Flattery, MD  ?lactulose, encephalopathy, (CHRONULAC) 10 GM/15ML SOLN Take 20 g by mouth 2 (two) times daily.    [provider]  ?LORazepam (ATIVAN) 0.5 MG tablet Take 0.5 mg by mouth every 4 (four) hours as needed for anxiety.    [provider]  ?midodrine (PROAMATINE) 10 MG tablet Take 10 mg by mouth 3 (three) times daily.    [provider]  ?morphine (ROXANOL) 20 MG/ML concentrated solution Take 5-10 mg by mouth every 2 (two) hours as needed for severe pain.    [provider]  ?nitrofurantoin (MACRODANTIN) 100 MG capsule Take 100 mg by mouth 2 (two) times daily. 02/16/22   [provider]  ?ondansetron (ZOFRAN) 4 MG tablet Take 1 tablet (4 mg total) by mouth every 6 (six) hours as needed for nausea. ?Patient taking differently: Take 4 mg by mouth every 6 (six) hours as needed for nausea or vomiting. 02/06/22   Little Ishikawa, MD  ?OVER THE COUNTER MEDICATION Take 1 capsule by mouth daily. L-Bic Rinox - supplement    [provider]  ?OVER THE COUNTER MEDICATION Take 1 capsule by mouth daily. L-Bic Clean    [provider]  ?pantoprazole (PROTONIX) 40 MG tablet Take 1 tablet (40 mg total) by mouth daily. 01/20/22 02/23/22  Damita Lack, MD  ?prochlorperazine (COMPAZINE) 10 MG tablet Take 10 mg by mouth every 4 (four) hours as needed for nausea or vomiting.    [provider]  ?rifaximin (XIFAXAN) 550 MG TABS tablet Take 550 mg by mouth 2 (two) times daily.    [provider]  ?spironolactone (ALDACTONE) 50 MG tablet Take 1 tablet (50 mg total) by mouth daily. 01/20/22 02/23/22  Damita Lack, MD  ?traZODone (DESYREL) 50 MG tablet Take 0.5 tablets (25 mg total) by mouth at bedtime as needed for sleep. ?Patient taking differently: Take 50 mg by mouth at bedtime as needed for sleep. 02/06/22   Little Ishikawa,  MD  ? ? ? Allergies:  ? ?  ?Allergies  ?Allergen Reactions  ? Vitamin K And Related Hives, Shortness Of Breath, Itching and Rash  ?  Tolerated on 08/21/21 without reaction  ? Vancomycin Hives and Itching  ? ? ? Physical Exam:  ? ?Vitals ? ?Blood pressure (!) 114/57, pulse 89, temperature 98 ?F (36.7 ?C), temperature source Oral,  resp. rate (!) 22, height '5\' 1"'$  (1.549 m), weight 88.2 kg, SpO2 99 %. ? ? ?1. General frail, ill-appearing, jaundiced female, appears much older than stated age.  Jaundiced ? ?2. Normal affect and insight, Not Suicidal or Homicidal, Awake Alert, Oriented X 3. ? ?3. No F.N deficits, ALL C.Nerves Intact, Strength 5/5 all 4 extremities, Sensation intact all 4 extremities, Plantars down going. ? ?4. Ears and Eyes appear Normal, icteric sclera , PERRLA. Moist Oral Mucosa. ? ?5. Supple Neck, No JVD, No cervical lymphadenopathy appriciated, No Carotid Bruits. ? ?6. Symmetrical Chest wall movement, diminished air entry in the right lung ? ?7. RRR, No Gallops, Rubs or Murmurs, No Parasternal Heave.  +3 edema/anasarca ? ?8. Positive Bowel Sounds, significant ascites with fluid wave  ? ?9.  No Cyanosis, Normal Skin Turgor, No Skin Rash or Bruise. ? ?10. Good muscle tone,  joints appear normal , no effusions, Normal ROM. ? ?11. No Palpable Lymph Nodes in Neck or Axillae ? ? ? ? Data Review:  ? ? CBC ?Recent Labs  ?Lab 03/10/22 ?1550  ?WBC 3.7*  ?HGB 7.2*  ?HCT 21.1*  ?PLT 55*  ?MCV 100.5*  ?MCH 34.3*  ?MCHC 34.1  ?RDW 18.5*  ?LYMPHSABS 0.4*  ?MONOABS 0.4  ?EOSABS 0.1  ?BASOSABS 0.0  ? ?------------------------------------------------------------------------------------------------------------------ ? ?Chemistries  ?Recent Labs  ?Lab 03/10/22 ?1550  ?NA 116*  ?K 4.9  ?CL 90*  ?CO2 18*  ?GLUCOSE 104*  ?BUN 18  ?CREATININE 0.99  ?CALCIUM 7.9*  ?AST 73*  ?ALT 31  ?ALKPHOS 119  ?BILITOT 18.1*   ? ?------------------------------------------------------------------------------------------------------------------ ?estimated creatinine clearance is 77.8 mL/min (by C-G formula based on SCr of 0.99 mg/dL). ?---------------------------------------------------------------------------------------------------------

## 2022-03-10 NOTE — ED Provider Notes (Signed)
?[facility] ?Provider Note ? ? ?CSN: [id number] ?Arrival date & time: 03/10/22  1421 ? ?  ? ?History ? ?Chief Complaint  ?Patient presents with  ? Shortness of Breath  ? ? ?Victoria Gordon is a 41 y.o. female. ? ?Patient presents with shortness of breath.  She has alcoholic cirrhosis.  She is hospice care at home.  The hospice doctor sent her over here ? ?The history is provided by the patient and medical records.  ?Shortness of Breath ?Severity:  Moderate ?Onset quality:  Sudden ?Timing:  Constant ?Progression:  Worsening ?Chronicity:  Recurrent ?Context: activity   ?Relieved by:  Nothing ?Worsened by:  Nothing ?Ineffective treatments:  None tried ?Associated symptoms: abdominal pain   ?Associated symptoms: no chest pain, no cough, no headaches and no rash   ? ?  ? ?Home Medications ?Prior to Admission medications   ?Medication Sig Start Date End Date Taking? Authorizing Provider  ?ciprofloxacin (CIPRO) 500 MG tablet Take 1 tablet (500 mg total) by mouth daily. ?Patient taking differently: Take 500 mg by mouth daily. contineous 01/20/22   Amin, Jeanella Flattery, MD  ?folic acid (FOLVITE) 1 MG tablet Take 1 mg by mouth daily.    [provider]  ?furosemide (LASIX) 40 MG tablet Take 1 tablet (40 mg total) by mouth daily. ?Patient taking differently: Take 40 mg by mouth 2 (two) times daily. 01/20/22 02/23/22  Amin, Jeanella Flattery, MD  ?lactulose, encephalopathy, (CHRONULAC) 10 GM/15ML SOLN Take 20 g by mouth 2 (two) times daily.    [provider]  ?LORazepam (ATIVAN) 0.5 MG tablet Take 0.5 mg by mouth every 4 (four) hours as needed for anxiety.    [provider]  ?midodrine (PROAMATINE) 10 MG tablet Take 10 mg by mouth 3 (three) times daily.    [provider]  ?morphine (ROXANOL) 20 MG/ML concentrated solution Take 5-10 mg by mouth every 2 (two) hours as needed for severe pain.    [provider]  ?nitrofurantoin (MACRODANTIN) 100 MG capsule Take 100 mg  by mouth 2 (two) times daily. 02/16/22   [provider]  ?ondansetron (ZOFRAN) 4 MG tablet Take 1 tablet (4 mg total) by mouth every 6 (six) hours as needed for nausea. ?Patient taking differently: Take 4 mg by mouth every 6 (six) hours as needed for nausea or vomiting. 02/06/22   Little Ishikawa, MD  ?OVER THE COUNTER MEDICATION Take 1 capsule by mouth daily. L-Bic Rinox - supplement    [provider]  ?OVER THE COUNTER MEDICATION Take 1 capsule by mouth daily. L-Bic Clean    [provider]  ?pantoprazole (PROTONIX) 40 MG tablet Take 1 tablet (40 mg total) by mouth daily. 01/20/22 02/23/22  Damita Lack, MD  ?prochlorperazine (COMPAZINE) 10 MG tablet Take 10 mg by mouth every 4 (four) hours as needed for nausea or vomiting.    [provider]  ?rifaximin (XIFAXAN) 550 MG TABS tablet Take 550 mg by mouth 2 (two) times daily.    [provider]  ?spironolactone (ALDACTONE) 50 MG tablet Take 1 tablet (50 mg total) by mouth daily. 01/20/22 02/23/22  Damita Lack, MD  ?traZODone (DESYREL) 50 MG tablet Take 0.5 tablets (25 mg total) by mouth at bedtime as needed for sleep. ?Patient taking differently: Take 50 mg by mouth at bedtime as needed for sleep. 02/06/22   Little Ishikawa, MD  ?   ? ?Allergies    ?Vitamin k and related and Vancomycin   ? ?  Review of Systems   ?Review of Systems  ?Constitutional:  Negative for appetite change and fatigue.  ?HENT:  Negative for congestion, ear discharge and sinus pressure.   ?Eyes:  Negative for discharge.  ?Respiratory:  Positive for shortness of breath. Negative for cough.   ?Cardiovascular:  Negative for chest pain.  ?Gastrointestinal:  Positive for abdominal pain. Negative for diarrhea.  ?Genitourinary:  Negative for frequency and hematuria.  ?Musculoskeletal:  Negative for back pain.  ?Skin:  Negative for rash.  ?Neurological:  Negative for seizures and headaches.  ?Psychiatric/Behavioral:  Negative for hallucinations.    ? ?Physical Exam ?Updated Vital Signs ?BP 115/62   Pulse 91   Temp 98 ?F (36.7 ?C) (Oral)   Resp 20   Ht '5\' 1"'$  (1.549 m)   Wt 88.2 kg   LMP  (LMP Unknown) Comment: pt. stated, its been over a year  SpO2 99%   BMI 36.74 kg/m?  ?Physical Exam ?Vitals and nursing note reviewed.  ?Constitutional:   ?   Appearance: She is well-developed.  ?HENT:  ?   Head: Normocephalic.  ?   Nose: Nose normal.  ?Eyes:  ?   General: No scleral icterus. ?   Conjunctiva/sclera: Conjunctivae normal.  ?   Comments: Sclera is icteric  ?Neck:  ?   Thyroid: No thyromegaly.  ?Cardiovascular:  ?   Rate and Rhythm: Normal rate and regular rhythm.  ?   Heart sounds: No murmur heard. ?  No friction rub. No gallop.  ?Pulmonary:  ?   Breath sounds: No stridor. No wheezing or rales.  ?Chest:  ?   Chest wall: No tenderness.  ?Abdominal:  ?   General: There is distension.  ?   Tenderness: There is abdominal tenderness. There is no rebound.  ?Musculoskeletal:     ?   General: Normal range of motion.  ?   Cervical back: Neck supple.  ?Lymphadenopathy:  ?   Cervical: No cervical adenopathy.  ?Skin: ?   Findings: No erythema or rash.  ?Neurological:  ?   Mental Status: She is alert and oriented to person, place, and time.  ?   Motor: No abnormal muscle tone.  ?   Coordination: Coordination normal.  ?Psychiatric:     ?   Behavior: Behavior normal.  ? ? ?ED Results / Procedures / Treatments   ?Labs ?(all labs ordered are listed, but only abnormal results are displayed) ?Labs Reviewed  ?CBC WITH DIFFERENTIAL/PLATELET - Abnormal; Notable for the following components:  ?    Result Value  ? WBC 3.7 (*)   ? RBC 2.10 (*)   ? Hemoglobin 7.2 (*)   ? HCT 21.1 (*)   ? MCV 100.5 (*)   ? MCH 34.3 (*)   ? RDW 18.5 (*)   ? Platelets 55 (*)   ? Lymphs Abs 0.4 (*)   ? All other components within normal limits  ?COMPREHENSIVE METABOLIC PANEL - Abnormal; Notable for the following components:  ? Sodium 116 (*)   ? Chloride 90 (*)   ? CO2 18 (*)   ? Glucose, Bld 104 (*)    ? Calcium 7.9 (*)   ? Total Protein 5.3 (*)   ? Albumin 2.0 (*)   ? AST 73 (*)   ? Total Bilirubin 18.1 (*)   ? All other components within normal limits  ?PROTIME-INR - Abnormal; Notable for the following components:  ? Prothrombin Time 31.3 (*)   ? INR 3.0 (*)   ? All  other components within normal limits  ?LIPASE, BLOOD  ?TYPE AND SCREEN  ? ? ?EKG ?None ? ?Radiology ?CT ABDOMEN PELVIS WO CONTRAST ? ?Result Date: 03/10/2022 ?CLINICAL DATA:  Abdominal pain.  Cirrhosis. EXAM: CT ABDOMEN AND PELVIS WITHOUT CONTRAST TECHNIQUE: Multidetector CT imaging of the abdomen and pelvis was performed following the standard protocol without IV contrast. RADIATION DOSE REDUCTION: This exam was performed according to the departmental dose-optimization program which includes automated exposure control, adjustment of the mA and/or kV according to patient size and/or use of iterative reconstruction technique. COMPARISON:  CT chest, abdomen, and pelvis 11/05/2021 FINDINGS: Lower chest: Partially visualized large right pleural effusion with associated right lower lobe compressive atelectasis. Normal heart size. Decreased attenuation of the blood pool suggesting anemia. Diffusely thick-walled appearance of the included distal esophagus with numerous varices demonstrated on the prior contrast-enhanced CT. Hepatobiliary: Morphologic features of cirrhosis. Cholecystectomy. No gross biliary dilatation. Pancreas: Grossly unremarkable. Spleen: Decreased splenomegaly. Adrenals/Urinary Tract: Grossly unremarkable adrenal glands. No renal calculi or hydronephrosis. Unremarkable bladder. Stomach/Bowel: The stomach is moderately distended by ingested material with possible mild diffuse gastric wall thickening. Bowel assessment is limited by absence of IV and oral contrast, lack of significant distension of the majority of the small bowel, and surrounding ascites. Wall thickening is questioned of multiple small bowel loops. There is a moderate  amount of stool in the colon. There is no evidence of bowel obstruction or appendicitis. Vascular/Lymphatic: Normal caliber of the abdominal aorta. Upper abdominal varices better demonstrated on the prior con

## 2022-03-11 ENCOUNTER — Inpatient Hospital Stay (HOSPITAL_COMMUNITY): Payer: Medicaid Other

## 2022-03-11 DIAGNOSIS — K7031 Alcoholic cirrhosis of liver with ascites: Secondary | ICD-10-CM | POA: Diagnosis not present

## 2022-03-11 DIAGNOSIS — K709 Alcoholic liver disease, unspecified: Secondary | ICD-10-CM

## 2022-03-11 DIAGNOSIS — D61818 Other pancytopenia: Secondary | ICD-10-CM

## 2022-03-11 DIAGNOSIS — D649 Anemia, unspecified: Secondary | ICD-10-CM

## 2022-03-11 DIAGNOSIS — E871 Hypo-osmolality and hyponatremia: Secondary | ICD-10-CM | POA: Diagnosis not present

## 2022-03-11 LAB — CBC
HCT: 17.4 % — ABNORMAL LOW (ref 36.0–46.0)
Hemoglobin: 5.9 g/dL — CL (ref 12.0–15.0)
MCH: 34.5 pg — ABNORMAL HIGH (ref 26.0–34.0)
MCHC: 33.9 g/dL (ref 30.0–36.0)
MCV: 101.8 fL — ABNORMAL HIGH (ref 80.0–100.0)
Platelets: 37 10*3/uL — ABNORMAL LOW (ref 150–400)
RBC: 1.71 MIL/uL — ABNORMAL LOW (ref 3.87–5.11)
RDW: 18.6 % — ABNORMAL HIGH (ref 11.5–15.5)
WBC: 2.6 10*3/uL — ABNORMAL LOW (ref 4.0–10.5)
nRBC: 0 % (ref 0.0–0.2)

## 2022-03-11 LAB — HEMOGLOBIN AND HEMATOCRIT, BLOOD
HCT: 21.8 % — ABNORMAL LOW (ref 36.0–46.0)
Hemoglobin: 7.7 g/dL — ABNORMAL LOW (ref 12.0–15.0)

## 2022-03-11 LAB — COMPREHENSIVE METABOLIC PANEL
ALT: 26 U/L (ref 0–44)
AST: 66 U/L — ABNORMAL HIGH (ref 15–41)
Albumin: 2.3 g/dL — ABNORMAL LOW (ref 3.5–5.0)
Alkaline Phosphatase: 91 U/L (ref 38–126)
Anion gap: 8 (ref 5–15)
BUN: 17 mg/dL (ref 6–20)
CO2: 18 mmol/L — ABNORMAL LOW (ref 22–32)
Calcium: 7.9 mg/dL — ABNORMAL LOW (ref 8.9–10.3)
Chloride: 91 mmol/L — ABNORMAL LOW (ref 98–111)
Creatinine, Ser: 0.79 mg/dL (ref 0.44–1.00)
GFR, Estimated: >60 mL/min (ref >60)
Glucose, Bld: 92 mg/dL (ref 70–99)
Potassium: 4.7 mmol/L (ref 3.5–5.1)
Sodium: 117 mmol/L — CL (ref 135–145)
Total Bilirubin: 16.1 mg/dL — ABNORMAL HIGH (ref 0.3–1.2)
Total Protein: 4.8 g/dL — ABNORMAL LOW (ref 6.5–8.1)

## 2022-03-11 LAB — PREPARE RBC (CROSSMATCH)

## 2022-03-11 MED ORDER — HYDROCORTISONE 1 % EX CREA
TOPICAL_CREAM | Freq: Three times a day (TID) | CUTANEOUS | Status: DC | PRN
  Filled 2022-03-11: qty 28.4

## 2022-03-11 MED ORDER — MORPHINE SULFATE (CONCENTRATE) 10 MG/0.5ML PO SOLN
5.0000 mg | ORAL | Status: DC | PRN
  Administered 2022-03-11 (×2): 10 mg via ORAL
  Filled 2022-03-11 (×2): qty 0.5

## 2022-03-11 MED ORDER — SODIUM CHLORIDE 0.9% IV SOLUTION: Freq: Once | INTRAVENOUS | Status: AC

## 2022-03-11 MED ORDER — FUROSEMIDE 10 MG/ML IJ SOLN
20.0000 mg | Freq: Once | INTRAMUSCULAR | Status: AC
  Administered 2022-03-11: 20 mg via INTRAVENOUS
  Filled 2022-03-11: qty 2

## 2022-03-11 MED ORDER — SPIRONOLACTONE 25 MG PO TABS
100.0000 mg | ORAL_TABLET | Freq: Every day | ORAL | Status: DC
  Administered 2022-03-12: 100 mg via ORAL
  Filled 2022-03-11: qty 4

## 2022-03-11 MED ORDER — MORPHINE SULFATE (CONCENTRATE) 10 MG/0.5ML PO SOLN
5.0000 mg | ORAL | Status: DC | PRN
  Administered 2022-03-11 – 2022-03-12 (×6): 10 mg via ORAL
  Filled 2022-03-11 (×6): qty 0.5

## 2022-03-11 MED ORDER — ONDANSETRON 4 MG PO TBDP
4.0000 mg | ORAL_TABLET | Freq: Three times a day (TID) | ORAL | Status: DC | PRN
  Administered 2022-03-11: 4 mg via ORAL
  Filled 2022-03-11: qty 1

## 2022-03-11 NOTE — Progress Notes (Signed)
UNMATCHED BLOOD PRODUCT NOTE ? ?Compare the patient ID on the blood tag to the patient ID on the hospital armband and Blood Bank armband. Then confirm the unit number on the blood tag matches the unit number on the blood product.  If a discrepancy is discovered return the product to blood bank immediately. ? ? ?Blood Product Type: Packed Red Blood Cells ? ?Unit #: (Found on blood product bag, begins with W) [id number] ? ?Product Code #: (Found on blood product bag, begins with E) [id number] ? ? ?Start Time: 9:27 AM ? ? ?Starting Rate: 120 ml/hr ? ?Rate increase/decreased  (if applicable):      ml/hr ? ?Rate changed time (if applicable):  ?Verified with Nigel Sloop LPN ? ?Stop Time: 1210 ? ? ?All Other Documentation should be documented within the Blood Admin Flowsheet per policy. ? ? ?

## 2022-03-11 NOTE — Assessment & Plan Note (Addendum)
--   unfortunately due to very low platelets and ongoing blood transfusion it is not possible to have thoracentesis today, I spoke with Dr Thornton Papas and could revisit on Monday 5/15 if still appropriate and platelets improved some.  Family reports that last thoracentesis left patient bleeding for a couple of days and she reportedly lost about a pint of blood per family.   ?-- after speaking with patient she understands that it is too risky with her low platelets to have more procedures and she requested to go home.  ?

## 2022-03-11 NOTE — Assessment & Plan Note (Signed)
--   focusing on total comfort care, I have increased morphine interval to every 15 mins prn  ?

## 2022-03-11 NOTE — Progress Notes (Addendum)
Went into patients room several times during shift, noted patient resting patient would awaken to touch. Patient had no complaints just restless. Patient ambulated to bathroom with assistance, once patient returned to bed noted patient in pain. Patient given PRN pain medication, see MAR. Family at bedside. MD Wynetta Emery made aware. ?

## 2022-03-11 NOTE — Assessment & Plan Note (Signed)
--   secondary to chronic liver disease (end stage cirrhosis) and volume overload  ?
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
## 2022-03-11 NOTE — Progress Notes (Signed)
?PROGRESS NOTE ? ? ?Victoria Gordon  ZCH:[id number] DOB: Oct 25, 1981 DOA: 03/10/2022 ?PCP: Pcp, No  ? ?Chief Complaint  ?Patient presents with  ? Shortness of Breath  ? ?Level of care: Med-Surg ? ?Brief Admission History:  ?41 y.o. 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, portal hypertensive gastropathy, recurrent right pleural effusion, pancytopenia, chronic hyponatremia, PTSD, anxiety, substance use disorder with alcohol abuse(she stopped drinking since her recent discharge on 02/24/2022), patient with recent hospitalization due to alcoholic cirrhosis, ascites, pleural effusion, status postthoracentesis, ATTEMPT at paracentesis, she was discharged home with hospice, since to ED secondary to shortness of breath, abdominal discomfort secondary to increased abdominal girth from ascites, patient is followed by hospice, patient reports her hospice nurse told her she will increase her morphine, but patient reports that they have not done that yet, given her dyspnea she presents to ED.  In ED work-up significant for recurrent right pleural effusion, ascites, sodium of 116, total bilirubin of 18, white blood cell count of 3.7, hemoglobin of 7.2, platelet count of 55 K,given  her symptomatic dyspnea, and need for thoracentesis and paracentesis. [facility]  Hospitalist consulted to admit.    ? ?03/11/2022: unfortunately due to low platelets and ongoing blood transfusion unable to perform thoracentesis today, revisit on Monday 5/15 if still appropriate.  Pt appears very ill and appears terminally ill at this time.  We have requested a palliative consultation.   ?  ?Assessment and Plan: ?* Hyponatremia ?-- secondary to chronic liver disease (end stage cirrhosis) and volume overload  ? ?Recurrent right pleural effusion ?-- unfortunately due to very low platelets and ongoing blood transfusion it is not possible to have  thoracentesis today, I spoke with Dr Thornton Papas and could revisit on Monday 5/15 if still appropriate and platelets improved some.  Family reports that last thoracentesis left patient bleeding for a couple of days and she reportedly lost about a pint of blood per family.   ? ?Hyperbilirubinemia ?-- focusing on total comfort care, I have increased morphine interval to every 15 mins prn  ? ?Portal hypertensive gastropathy (HCC) ?-- diuretics as ordered by GI service  ? ?Pancytopenia ([place]) ?-- pt is at end stages of disease and unfortunately prognosis is extremely poor.   ?-- I worry that if further declines she may be too unstable to get back home.  Following.  ? ? ?DVT prophylaxis: scds  ?Code Status: DNR  ?Family Communication: sister at bedside  ?Disposition: Status is: Inpatient ?Remains inpatient appropriate because: PRBC transfusion in process  ?  ?Consultants:  ?Palliative care  ?Procedures:  ? ?Antimicrobials:  ?  ?Subjective: ?Pt reports itching burning in her legs  ?Objective: ?Vitals:  ? 03/11/22 0959 03/11/22 1216 03/11/22 1351 03/11/22 1408  ?BP: (!) 104/56 109/61 119/66 110/68  ?Pulse: 88 87 88 90  ?Resp: '20 20 20 18  '$ ?Temp: 98.8 ?F (37.1 ?C) 98.6 ?F (37 ?C) 98.6 ?F (37 ?C) 98.7 ?F (37.1 ?C)  ?TempSrc: Oral Oral Oral   ?SpO2: 96% 98% 100%   ?Weight:      ?Height:      ? ? ?Intake/Output Summary (Last 24 hours) at 03/11/2022 1532 ?Last data filed at 03/11/2022 1507 ?Gross per 24 hour  ?Intake 1498.84 ml  ?Output --  ?Net 1498.84 ml  ? ?Filed Weights  ? 03/10/22 1428  ?Weight: 88.2 kg  ? ?Examination: ? ?General exam: awake, alert, appears terminally ill. Pt appears to have anasarca.  ?  Respiratory system: shallow breathing seen.  ?Cardiovascular system: normal S1 & S2 heard. No JVD, murmurs, rubs, gallops or clicks. No pedal edema. ?Gastrointestinal system: Abdomen is distended with subcutaneous edema. No organomegaly or masses felt. Normal bowel sounds heard. ?Central nervous system: Alert and oriented. No focal  neurological deficits. ?Extremities: Symmetric 5 x 5 power. ?Skin: small red blotches of irritation on left leg 3+ edema pitting all extremities (anasarca) ?Psychiatry: Judgement and insight appear normal. Mood & affect appropriate.  ? ?Data Reviewed: I have personally reviewed following labs and imaging studies ? ?CBC: ?Recent Labs  ?Lab 03/10/22 ?1550 03/11/22 ?0541  ?WBC 3.7* 2.6*  ?NEUTROABS 2.8  --   ?HGB 7.2* 5.9*  ?HCT 21.1* 17.4*  ?MCV 100.5* 101.8*  ?PLT 55* 37*  ? ? ?Basic Metabolic Panel: ?Recent Labs  ?Lab 03/10/22 ?1550 03/11/22 ?0541  ?NA 116* 117*  ?K 4.9 4.7  ?CL 90* 91*  ?CO2 18* 18*  ?GLUCOSE 104* 92  ?BUN 18 17  ?CREATININE 0.99 0.79  ?CALCIUM 7.9* 7.9*  ? ? ?CBG: ?No results for input(s): GLUCAP in the last 168 hours. ? ?No results found for this or any previous visit (from the past 240 hour(s)).  ? ?Radiology Studies: ?CT ABDOMEN PELVIS WO CONTRAST ? ?Result Date: 03/10/2022 ?CLINICAL DATA:  Abdominal pain.  Cirrhosis. EXAM: CT ABDOMEN AND PELVIS WITHOUT CONTRAST TECHNIQUE: Multidetector CT imaging of the abdomen and pelvis was performed following the standard protocol without IV contrast. RADIATION DOSE REDUCTION: This exam was performed according to the departmental dose-optimization program which includes automated exposure control, adjustment of the mA and/or kV according to patient size and/or use of iterative reconstruction technique. COMPARISON:  CT chest, abdomen, and pelvis 11/05/2021 FINDINGS: Lower chest: Partially visualized large right pleural effusion with associated right lower lobe compressive atelectasis. Normal heart size. Decreased attenuation of the blood pool suggesting anemia. Diffusely thick-walled appearance of the included distal esophagus with numerous varices demonstrated on the prior contrast-enhanced CT. Hepatobiliary: Morphologic features of cirrhosis. Cholecystectomy. No gross biliary dilatation. Pancreas: Grossly unremarkable. Spleen: Decreased splenomegaly.  Adrenals/Urinary Tract: Grossly unremarkable adrenal glands. No renal calculi or hydronephrosis. Unremarkable bladder. Stomach/Bowel: The stomach is moderately distended by ingested material with possible mild diffuse gastric wall thickening. Bowel assessment is limited by absence of IV and oral contrast, lack of significant distension of the majority of the small bowel, and surrounding ascites. Wall thickening is questioned of multiple small bowel loops. There is a moderate amount of stool in the colon. There is no evidence of bowel obstruction or appendicitis. Vascular/Lymphatic: Normal caliber of the abdominal aorta. Upper abdominal varices better demonstrated on the prior contrast-enhanced CT. No gross lymphadenopathy. Reproductive: Unremarkable uterus. Surgical clip in the right adnexa. Other: Moderate volume ascites, increased from prior. No clearly loculated fluid collection identified. No pneumoperitoneum. Diffuse mesenteric edema. Widespread body wall edema and subcutaneous fluid. Musculoskeletal: No acute osseous abnormality or suspicious osseous lesion. IMPRESSION: 1. Cirrhosis and portal hypertension with increased, moderate volume ascites and anasarca. 2. Limited bowel assessment. Questionable wall thickening of multiple small bowel loops and of the stomach which could be secondary to cirrhosis and volume overload or possibly gastroenteritis. Moderate colonic stool burden. No bowel obstruction. 3. Partially visualized large right pleural effusion with right lower lobe atelectasis. Electronically Signed   By: Logan Bores M.D.   On: 03/10/2022 16:53  ? ?DG Chest Port 1 View ? ?Result Date: 03/10/2022 ?CLINICAL DATA:  Chest pain, shortness of breath. EXAM: PORTABLE CHEST 1 VIEW COMPARISON:  February 23, 2022. FINDINGS: The heart size and mediastinal contours are within normal limits. Left lung is clear. Interval development of moderate right pleural effusion with associated atelectasis or infiltrate. The  visualized skeletal structures are unremarkable. IMPRESSION: Interval development of moderate size right pleural effusion with associated atelectasis or infiltrate. Electronically Signed   By: Marijo Conception M.D.   On:

## 2022-03-11 NOTE — Consult Note (Signed)
? ?Gastroenterology Consult  ? ?Referring Provider: No ref. provider found ?Primary Care Physician:  Pcp, No ?Primary Gastroenterologist:  Dr. Candis Schatz ([facility]) ? ?Patient ID: Victoria Gordon; [id number]; Apr 09, 1981  ? ?Admit date: 03/10/2022 ? LOS: 1 day  ? ?Date of Consultation: 03/11/2022 ? ?Reason for Consultation:  ESLD, cirrhosis ? ?History of Present Illness  ? ?Victoria Gordon is a 41 y.o. year old female with history of alcoholic cirrhosis with large esophageal varices, portal hypertensive gastropathy, recurrent right pleural effusion, pancytopenia, chronic hyponatremia, PTSD, anxiety, substance use disorder with alcohol abuse (no drink since her discharge 4/27). She was recently hospitalized with alcoholic cirrhosis, ascites, pleural effusion s/p thoracentesis and attempt at paracentesis and discharged home with hospice on 02/24/22. She presented to the ED yesterday at recommendation of hospice due to SOB and abdominal discomfort due to increased ascites. GI consulted for recommendations.  ? ? ?Patient reported to the ED that hospice was supposed to be increasing her morphine but has not occurred yet.  ? ?ED course: ?CT A/P with findings of cirrhosis and portal hypertension with increased moderate volume ascites and anasarca, limited bowel assessment with questionable wall thickening of the small bowel loops and stomach with moderate stools burden. No bowel obstruction. Large right pleural effusion and lower lobe atelectasis.  ?Labs - Hgb 7.2, WBC 3.7, Plts 55, Cr 0.99, albumin 2, AST 73, ALT 31, Alk phos 119, T Bili 18.1 (down from 24 on discharge), Lipase 75, PT 31, INR 3 ? ?Hospitalist requested ultrasound thoracentesis and paracentesis with albumin and continued home lactulose, xifaxin, aldactone, PPI, and lasix as well as comfort measure with IV and po pain medication. He also re-consulted palliative care and discussed hospice home instead of home with hospice however patient prefers to  go home to be with family.  ? ?Consult: ?Patient reports continued pain to her abdomen as well as intermittent confusion.  She reports that her shortness of breath is okay while lying in the bed.  Her sister-in-law who was at the bedside during exam expressed that when the patient gets up to use the bathroom she becomes extremely short of breath and takes her a while to catch her breath back in the bed.  Patient reports no bowel movement today thus far.  Substernal reported only a small smear on the toilet tissue and states that she is taking her meds as prescribed.  Patient is sister-in-law's concern is of the swelling as she can continues to complain of pain in her legs due to the tightness from anasarca.  No evidence of GI bleeding.  Reports that pain control has been difficult with home hospice.  Sister-in-law stated that hospice was recently increased her pain medication dosing at home but it was still not filled at the pharmacy prior to coming to the ED. ? ? ? ? ?Past Medical History:  ?Diagnosis Date  ? Alcohol abuse   ? Alcoholic cirrhosis of liver with ascites ([place])   ? Anemia   ? Anxiety   ? Cirrhosis of liver ([place])   ? Hemorrhoid   ? Hepatitis B   ? Major depressive disorder   ? PTSD (post-traumatic stress disorder)   ? Substance abuse ([place])   ? ? ?Past Surgical History:  ?Procedure Laterality Date  ? BIOPSY  08/29/2021  ? Procedure: BIOPSY;  Surgeon: Daryel November, MD;  Location: [facility];  Service: Gastroenterology;;  ? CESAREAN SECTION    ? CHOLECYSTECTOMY    ? COLONOSCOPY WITH PROPOFOL N/A 08/29/2021  ?  Procedure: COLONOSCOPY WITH PROPOFOL;  Surgeon: Daryel November, MD;  Location: [facility];  Service: Gastroenterology;  Laterality: N/A;  ? ESOPHAGOGASTRODUODENOSCOPY (EGD) WITH PROPOFOL N/A 08/29/2021  ? Procedure: ESOPHAGOGASTRODUODENOSCOPY (EGD) WITH PROPOFOL;  Surgeon: Daryel November, MD;  Location: [facility];  Service: Gastroenterology;  Laterality: N/A;  ? IR  THORACENTESIS ASP PLEURAL SPACE W/IMG GUIDE  11/23/2021  ? IR THORACENTESIS ASP PLEURAL SPACE W/IMG GUIDE  02/23/2022  ? MOUTH SURGERY    ? TUBAL LIGATION Bilateral 07/22/2014  ? Procedure: POST PARTUM TUBAL LIGATION;  Surgeon: Woodroe Mode, MD;  Location: [facility];  Service: Gynecology;  Laterality: Bilateral;  ? ? ?Prior to Admission medications   ?Medication Sig Start Date End Date Taking? Authorizing Provider  ?ciprofloxacin (CIPRO) 500 MG tablet Take 1 tablet (500 mg total) by mouth daily. ?Patient taking differently: Take 500 mg by mouth daily. contineous 01/20/22   Amin, Jeanella Flattery, MD  ?folic acid (FOLVITE) 1 MG tablet Take 1 mg by mouth daily.    [provider]  ?furosemide (LASIX) 40 MG tablet Take 1 tablet (40 mg total) by mouth daily. ?Patient taking differently: Take 40 mg by mouth 2 (two) times daily. 01/20/22 02/23/22  Amin, Jeanella Flattery, MD  ?lactulose, encephalopathy, (CHRONULAC) 10 GM/15ML SOLN Take 20 g by mouth 2 (two) times daily.    [provider]  ?LORazepam (ATIVAN) 0.5 MG tablet Take 0.5 mg by mouth every 4 (four) hours as needed for anxiety.    [provider]  ?midodrine (PROAMATINE) 10 MG tablet Take 10 mg by mouth 3 (three) times daily.    [provider]  ?morphine (ROXANOL) 20 MG/ML concentrated solution Take 5-10 mg by mouth every 2 (two) hours as needed for severe pain.    [provider]  ?nitrofurantoin (MACRODANTIN) 100 MG capsule Take 100 mg by mouth 2 (two) times daily. 02/16/22   [provider]  ?ondansetron (ZOFRAN) 4 MG tablet Take 1 tablet (4 mg total) by mouth every 6 (six) hours as needed for nausea. ?Patient taking differently: Take 4 mg by mouth every 6 (six) hours as needed for nausea or vomiting. 02/06/22   Little Ishikawa, MD  ?OVER THE COUNTER MEDICATION Take 1 capsule by mouth daily. L-Bic Rinox - supplement    [provider]  ?OVER THE COUNTER MEDICATION Take 1 capsule by mouth daily. L-Bic Clean     [provider]  ?pantoprazole (PROTONIX) 40 MG tablet Take 1 tablet (40 mg total) by mouth daily. 01/20/22 02/23/22  Damita Lack, MD  ?prochlorperazine (COMPAZINE) 10 MG tablet Take 10 mg by mouth every 4 (four) hours as needed for nausea or vomiting.    [provider]  ?rifaximin (XIFAXAN) 550 MG TABS tablet Take 550 mg by mouth 2 (two) times daily.    [provider]  ?spironolactone (ALDACTONE) 50 MG tablet Take 1 tablet (50 mg total) by mouth daily. 01/20/22 02/23/22  Damita Lack, MD  ?traZODone (DESYREL) 50 MG tablet Take 0.5 tablets (25 mg total) by mouth at bedtime as needed for sleep. ?Patient taking differently: Take 50 mg by mouth at bedtime as needed for sleep. 02/06/22   Little Ishikawa, MD  ? ? ?Current Facility-Administered Medications  ?Medication Dose Route Frequency Provider Last Rate Last Admin  ? 0.9 %  sodium chloride infusion (Manually program via Guardrails IV Fluids)   Intravenous Once Johnson, Clanford L, MD      ? albumin human 25 % solution 25  g  25 g Intravenous Q6H Elgergawy, Silver Huguenin, MD 60 mL/hr at 03/11/22 0518 25 g at 03/11/22 0518  ? folic acid (FOLVITE) tablet 1 mg  1 mg Oral Daily Elgergawy, Silver Huguenin, MD   1 mg at 03/10/22 2051  ? furosemide (LASIX) injection 20 mg  20 mg Intravenous Once Johnson, Clanford L, MD      ? furosemide (LASIX) tablet 40 mg  40 mg Oral BID Elgergawy, Silver Huguenin, MD      ? lactulose (CHRONULAC) 10 GM/15ML solution 20 g  20 g Oral BID Elgergawy, Silver Huguenin, MD      ? LORazepam (ATIVAN) tablet 0.5 mg  0.5 mg Oral Q4H PRN Elgergawy, Silver Huguenin, MD   0.5 mg at 03/11/22 0801  ? midodrine (PROAMATINE) tablet 10 mg  10 mg Oral TID Elgergawy, Silver Huguenin, MD      ? morphine (PF) 2 MG/ML injection 2 mg  2 mg Intravenous Q4H PRN Elgergawy, Silver Huguenin, MD   2 mg at 03/10/22 2241  ? morphine CONCENTRATE 10 MG/0.5ML oral solution 5-10 mg  5-10 mg Oral Q2H PRN Elgergawy, Silver Huguenin, MD   10 mg at 03/11/22 0802  ? ondansetron (ZOFRAN)  injection 4 mg  4 mg Intravenous Q6H PRN Arrien, Jimmy Picket, MD   4 mg at 03/10/22 2203  ? pantoprazole (PROTONIX) EC tablet 40 mg  40 mg Oral Daily Elgergawy, Silver Huguenin, MD   40 mg at 03/10/22 2051  ? rifaximin (XIFA

## 2022-03-11 NOTE — Progress Notes (Signed)
PRBC transfusing with not adverse reaction;.  BP at start 105/53 , after 15 min was 104/56 pulse 88.  Scheduled midodrine given.  Gave morphine SL '10mg'$  and cortisone cream, sister in law applying to legs.  ?

## 2022-03-11 NOTE — Assessment & Plan Note (Addendum)
--   pt is at end stages of disease and unfortunately prognosis is extremely poor.   ?-- pt asked to go home today to resume home hospice care  ?

## 2022-03-11 NOTE — Progress Notes (Signed)
Lab called with critical lab, hemoglobin 5.9. MD Wynetta Emery made aware. New orders placed.  ?

## 2022-03-11 NOTE — Progress Notes (Signed)
Blood transfusion started at 120 mls/hr.   ?

## 2022-03-11 NOTE — Assessment & Plan Note (Signed)
--   diuretics as ordered by GI service  ?

## 2022-03-11 NOTE — Hospital Course (Addendum)
41 y.o. 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, portal hypertensive gastropathy, recurrent right pleural effusion, pancytopenia, chronic hyponatremia, PTSD, anxiety, substance use disorder with alcohol abuse(she stopped drinking since her recent discharge on 02/24/2022), patient with recent hospitalization due to alcoholic cirrhosis, ascites, pleural effusion, status postthoracentesis, ATTEMPT at paracentesis, she was discharged home with hospice, since to ED secondary to shortness of breath, abdominal discomfort secondary to increased abdominal girth from ascites, patient is followed by hospice, patient reports her hospice nurse told her she will increase her morphine, but patient reports that they have not done that yet, given her dyspnea she presents to ED.  In ED work-up significant for recurrent right pleural effusion, ascites, sodium of 116, total bilirubin of 18, white blood cell count of 3.7, hemoglobin of 7.2, platelet count of 55 K,given  her symptomatic dyspnea, and need for thoracentesis and paracentesis. [facility]  Hospitalist consulted to admit.    ? ?03/11/2022: unfortunately due to low platelets and ongoing blood transfusion unable to perform thoracentesis today, revisit on Monday 5/15 if still appropriate.  Pt appears very ill and appears terminally ill at this time.  We have requested a palliative consultation.   ? ?03/12/2022: after speaking with patient today, she understands that she likely would not be able to safely have more procedures done due to her very low platelets and she said she would like to go home today. She said home hospice has provided her home pain meds and she has everything she needs at home to continue full comfort care at home.   ?

## 2022-03-12 DIAGNOSIS — K3189 Other diseases of stomach and duodenum: Secondary | ICD-10-CM

## 2022-03-12 DIAGNOSIS — J90 Pleural effusion, not elsewhere classified: Secondary | ICD-10-CM

## 2022-03-12 DIAGNOSIS — K766 Portal hypertension: Secondary | ICD-10-CM

## 2022-03-12 DIAGNOSIS — E871 Hypo-osmolality and hyponatremia: Secondary | ICD-10-CM | POA: Diagnosis not present

## 2022-03-12 DIAGNOSIS — K7031 Alcoholic cirrhosis of liver with ascites: Secondary | ICD-10-CM | POA: Diagnosis not present

## 2022-03-12 LAB — TYPE AND SCREEN
ABO/RH(D): O POS
Antibody Screen: POSITIVE
Donor AG Type: NEGATIVE
Donor AG Type: NEGATIVE
Unit division: 0
Unit division: 0

## 2022-03-12 LAB — HEMOGLOBIN AND HEMATOCRIT, BLOOD
HCT: 25.7 % — ABNORMAL LOW (ref 36.0–46.0)
Hemoglobin: 8.9 g/dL — ABNORMAL LOW (ref 12.0–15.0)

## 2022-03-12 LAB — BPAM RBC
Blood Product Expiration Date: 202306062359
Blood Product Expiration Date: 202306062359
ISSUE DATE / TIME: 202305121348
ISSUE DATE / TIME: 202305121348
Unit Type and Rh: 5100
Unit Type and Rh: 5100

## 2022-03-12 MED ORDER — TRAZODONE HCL 50 MG PO TABS
50.0000 mg | ORAL_TABLET | Freq: Every evening | ORAL | Status: AC | PRN
Start: 2022-03-12 — End: ?

## 2022-03-12 MED ORDER — FUROSEMIDE 40 MG PO TABS: 40.0000 mg | ORAL_TABLET | Freq: Two times a day (BID) | ORAL | 0 refills | Status: AC

## 2022-03-12 MED ORDER — MORPHINE SULFATE (CONCENTRATE) 20 MG/ML PO SOLN: 5.0000 mg | ORAL | 0 refills | Status: AC | PRN

## 2022-03-12 MED ORDER — SPIRONOLACTONE 100 MG PO TABS: 100.0000 mg | ORAL_TABLET | Freq: Every day | ORAL | 0 refills | Status: AC

## 2022-03-12 NOTE — TOC Transition Note (Signed)
Transition of Care (TOC) - CM/SW Discharge Note ? ? ?Patient Details  ?Name: Victoria Gordon ?MRN: [id number] ?Date of Birth: [birth year] ? ?Transition of Care (TOC) CM/SW Contact:  ?Vineeth Fell D, LCSW ?Phone Number: ?03/12/2022, 1:17 PM ? ? ?Clinical Narrative:    ?Lana with [facility] notified that patient is discharging. TOC requested that RN see patient. Lana to make the homecare department aware of d/c and need to be seen.  ? ?  ?  ? ? ?Patient Goals and CMS Choice ?  ?  ?  ? ?Discharge Placement ?  ?           ?  ?  ?  ?  ? ?Discharge Plan and Services ?  ?  ?           ?  ?  ?  ?  ?  ?  ?  ?  ?  ?  ? ?Social Determinants of Health (SDOH) Interventions ?  ? ? ?Readmission Risk Interventions ?   ? View : No data to display.  ?  ?  ?  ? ? ? ? ? ?

## 2022-03-12 NOTE — Discharge Summary (Signed)
Physician Discharge Summary  ?Victoria Gordon VOZ:[id number] DOB: Feb 19, 1981 DOA: 03/10/2022 ? ?Admit date: 03/10/2022 ?Discharge date: 03/12/2022 ? ?Admitted From:  Home Hospice  ? ?Disposition: Home Hospice  ? ?Please call Hospice RN first before coming to hospital  ? ?Discharge Condition: Hospice   ?CODE STATUS: DNR   ?DIET: comfort care  ? ?Brief Hospitalization Summary: ?Please see all hospital notes, images, labs for full details of the hospitalization. ?41 y.o. 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, portal hypertensive gastropathy, recurrent right pleural effusion, pancytopenia, chronic hyponatremia, PTSD, anxiety, substance use disorder with alcohol abuse(she stopped drinking since her recent discharge on 02/24/2022), patient with recent hospitalization due to alcoholic cirrhosis, ascites, pleural effusion, status postthoracentesis, ATTEMPT at paracentesis, she was discharged home with hospice, since to ED secondary to shortness of breath, abdominal discomfort secondary to increased abdominal girth from ascites, patient is followed by hospice, patient reports her hospice nurse told her she will increase her morphine, but patient reports that they have not done that yet, given her dyspnea she presents to ED.  In ED work-up significant for recurrent right pleural effusion, ascites, sodium of 116, total bilirubin of 18, white blood cell count of 3.7, hemoglobin of 7.2, platelet count of 55 K,given  her symptomatic dyspnea, and need for thoracentesis and paracentesis. [facility]  Hospitalist consulted to admit.    ? ?03/11/2022: unfortunately due to low platelets and ongoing blood transfusion unable to perform thoracentesis today, revisit on Monday 5/15 if still appropriate.  Pt appears very ill and appears terminally ill at this time.  We have requested a palliative consultation.   ? ?03/12/2022: after speaking  with patient today, she understands that she likely would not be able to safely have more procedures done due to her very low platelets and she said she would like to go home today. She said home hospice has provided her home pain meds and she has everything she needs at home to continue full comfort care at home.   ? ?Hospital Course by problem list ? ?Assessment and Plan: ?* Hyponatremia ?-- secondary to chronic liver disease (end stage cirrhosis) and volume overload  ? ?Recurrent right pleural effusion ?-- unfortunately due to very low platelets and ongoing blood transfusion it is not possible to have thoracentesis today, I spoke with Dr Thornton Papas and could revisit on Monday 5/15 if still appropriate and platelets improved some.  Family reports that last thoracentesis left patient bleeding for a couple of days and she reportedly lost about a pint of blood per family.   ?-- after speaking with patient she understands that it is too risky with her low platelets to have more procedures and she requested to go home.  ? ?Hyperbilirubinemia ?-- focusing on total comfort care, I have increased morphine interval to every 15 mins prn  ? ?Portal hypertensive gastropathy (H[facility]) ?-- diuretics as ordered by GI service  ? ?Pancytopenia ([place]) ?-- pt is at end stages of disease and unfortunately prognosis is extremely poor.   ?-- pt asked to go home today to resume home hospice care  ? ?Discharge Diagnoses:  ?Principal Problem: ?  Hyponatremia ?Active Problems: ?  Alcohol use disorder, severe, dependence ([place]) ?  Hepatic steatosis ?  Alcoholic liver disease ([place]) ?  Anemia ?  Pancytopenia ([place]) ?  Portal hypertensive gastropathy (H[facility]) ?  Depression ?  Hyperbilirubinemia ?  Recurrent right pleural effusion ?  Alcoholic cirrhosis of liver  with ascites ([place]) ? ? ?Discharge Instructions: ? ?Allergies as of 03/12/2022   ? ?   Reactions  ? Vitamin K And Related Hives, Shortness Of Breath, Itching, Rash  ? Tolerated on 08/21/21 without  reaction  ? Vancomycin Hives, Itching  ? ?  ? ?  ?Medication List  ?  ? ?STOP taking these medications   ? ?ciprofloxacin 500 MG tablet ?Commonly known as: CIPRO ?  ?nitrofurantoin 100 MG capsule ?Commonly known as: MACRODANTIN ?  ?OVER THE COUNTER MEDICATION ?  ?OVER THE COUNTER MEDICATION ?  ? ?  ? ?TAKE these medications   ? ?folic acid 1 MG tablet ?Commonly known as: FOLVITE ?Take 1 mg by mouth daily. ?  ?furosemide 40 MG tablet ?Commonly known as: Lasix ?Take 1 tablet (40 mg total) by mouth 2 (two) times daily. ?  ?lactulose (encephalopathy) 10 GM/15ML Soln ?Commonly known as: [place] ?Take 20 g by mouth 2 (two) times daily. ?  ?LORazepam 0.5 MG tablet ?Commonly known as: ATIVAN ?Take 0.5 mg by mouth every 4 (four) hours as needed for anxiety. ?  ?midodrine 10 MG tablet ?Commonly known as: PROAMATINE ?Take 10 mg by mouth 3 (three) times daily. ?  ?morphine 20 MG/ML concentrated solution ?Commonly known as: ROXANOL ?Take 0.25-0.5 mLs (5-10 mg total) by mouth every 15 (fifteen) minutes as needed for severe pain or moderate pain. ?What changed:  ?when to take this ?reasons to take this ?  ?ondansetron 4 MG tablet ?Commonly known as: ZOFRAN ?Take 1 tablet (4 mg total) by mouth every 6 (six) hours as needed for nausea. ?What changed: reasons to take this ?  ?pantoprazole 40 MG tablet ?Commonly known as: PROTONIX ?Take 1 tablet (40 mg total) by mouth daily. ?  ?prochlorperazine 10 MG tablet ?Commonly known as: COMPAZINE ?Take 10 mg by mouth every 4 (four) hours as needed for nausea or vomiting. ?  ?rifaximin 550 MG Tabs tablet ?Commonly known as: XIFAXAN ?Take 550 mg by mouth 2 (two) times daily. ?  ?spironolactone 100 MG tablet ?Commonly known as: Aldactone ?Take 1 tablet (100 mg total) by mouth daily. ?What changed:  ?medication strength ?how much to take ?  ?traZODone 50 MG tablet ?Commonly known as: DESYREL ?Take 1 tablet (50 mg total) by mouth at bedtime as needed for sleep. ?  ? ?  ? ? ?Allergies  ?Allergen  Reactions  ? Vitamin K And Related Hives, Shortness Of Breath, Itching and Rash  ?  Tolerated on 08/21/21 without reaction  ? Vancomycin Hives and Itching  ? ?Allergies as of 03/12/2022   ? ?   Reactions  ? Vitamin K And Related Hives, Shortness Of Breath, Itching, Rash  ? Tolerated on 08/21/21 without reaction  ? Vancomycin Hives, Itching  ? ?  ? ?  ?Medication List  ?  ? ?STOP taking these medications   ? ?ciprofloxacin 500 MG tablet ?Commonly known as: CIPRO ?  ?nitrofurantoin 100 MG capsule ?Commonly known as: MACRODANTIN ?  ?OVER THE COUNTER MEDICATION ?  ?OVER THE COUNTER MEDICATION ?  ? ?  ? ?TAKE these medications   ? ?folic acid 1 MG tablet ?Commonly known as: FOLVITE ?Take 1 mg by mouth daily. ?  ?furosemide 40 MG tablet ?Commonly known as: Lasix ?Take 1 tablet (40 mg total) by mouth 2 (two) times daily. ?  ?lactulose (encephalopathy) 10 GM/15ML Soln ?Commonly known as: [place] ?Take 20 g by mouth 2 (two) times daily. ?  ?LORazepam 0.5 MG tablet ?Commonly known as: ATIVAN ?  Take 0.5 mg by mouth every 4 (four) hours as needed for anxiety. ?  ?midodrine 10 MG tablet ?Commonly known as: PROAMATINE ?Take 10 mg by mouth 3 (three) times daily. ?  ?morphine 20 MG/ML concentrated solution ?Commonly known as: ROXANOL ?Take 0.25-0.5 mLs (5-10 mg total) by mouth every 15 (fifteen) minutes as needed for severe pain or moderate pain. ?What changed:  ?when to take this ?reasons to take this ?  ?ondansetron 4 MG tablet ?Commonly known as: ZOFRAN ?Take 1 tablet (4 mg total) by mouth every 6 (six) hours as needed for nausea. ?What changed: reasons to take this ?  ?pantoprazole 40 MG tablet ?Commonly known as: PROTONIX ?Take 1 tablet (40 mg total) by mouth daily. ?  ?prochlorperazine 10 MG tablet ?Commonly known as: COMPAZINE ?Take 10 mg by mouth every 4 (four) hours as needed for nausea or vomiting. ?  ?rifaximin 550 MG Tabs tablet ?Commonly known as: XIFAXAN ?Take 550 mg by mouth 2 (two) times daily. ?  ?spironolactone  100 MG tablet ?Commonly known as: Aldactone ?Take 1 tablet (100 mg total) by mouth daily. ?What changed:  ?medication strength ?how much to take ?  ?traZODone 50 MG tablet ?Commonly known as: DESYREL ?Take 1 tab

## 2022-03-12 NOTE — Progress Notes (Signed)
Nsg Discharge Note ? ?Admit Date:  03/10/2022 ?Discharge date: 03/12/2022 ?  ?[name] to be D/C'd Home per MD order.  AVS completed.Marland Kitchen ?Patient/caregiver able to verbalize understanding. ? ?Discharge Medication: ?Allergies as of 03/12/2022   ? ?   Reactions  ? Vitamin K And Related Hives, Shortness Of Breath, Itching, Rash  ? Tolerated on 08/21/21 without reaction  ? Vancomycin Hives, Itching  ? ?  ? ?  ?Medication List  ?  ? ?STOP taking these medications   ? ?ciprofloxacin 500 MG tablet ?Commonly known as: CIPRO ?  ?nitrofurantoin 100 MG capsule ?Commonly known as: MACRODANTIN ?  ?OVER THE COUNTER MEDICATION ?  ?OVER THE COUNTER MEDICATION ?  ? ?  ? ?TAKE these medications   ? ?folic acid 1 MG tablet ?Commonly known as: FOLVITE ?Take 1 mg by mouth daily. ?  ?furosemide 40 MG tablet ?Commonly known as: Lasix ?Take 1 tablet (40 mg total) by mouth 2 (two) times daily. ?  ?lactulose (encephalopathy) 10 GM/15ML Soln ?Commonly known as: [place] ?Take 20 g by mouth 2 (two) times daily. ?  ?LORazepam 0.5 MG tablet ?Commonly known as: ATIVAN ?Take 0.5 mg by mouth every 4 (four) hours as needed for anxiety. ?  ?midodrine 10 MG tablet ?Commonly known as: PROAMATINE ?Take 10 mg by mouth 3 (three) times daily. ?  ?morphine 20 MG/ML concentrated solution ?Commonly known as: ROXANOL ?Take 0.25-0.5 mLs (5-10 mg total) by mouth every 15 (fifteen) minutes as needed for severe pain or moderate pain. ?What changed:  ?when to take this ?reasons to take this ?  ?ondansetron 4 MG tablet ?Commonly known as: ZOFRAN ?Take 1 tablet (4 mg total) by mouth every 6 (six) hours as needed for nausea. ?What changed: reasons to take this ?  ?pantoprazole 40 MG tablet ?Commonly known as: PROTONIX ?Take 1 tablet (40 mg total) by mouth daily. ?  ?prochlorperazine 10 MG tablet ?Commonly known as: COMPAZINE ?Take 10 mg by mouth every 4 (four) hours as needed for nausea or vomiting. ?  ?rifaximin 550 MG Tabs tablet ?Commonly known as:  XIFAXAN ?Take 550 mg by mouth 2 (two) times daily. ?  ?spironolactone 100 MG tablet ?Commonly known as: Aldactone ?Take 1 tablet (100 mg total) by mouth daily. ?What changed:  ?medication strength ?how much to take ?  ?traZODone 50 MG tablet ?Commonly known as: DESYREL ?Take 1 tablet (50 mg total) by mouth at bedtime as needed for sleep. ?  ? ?  ? ? ?Discharge Assessment: ?Vitals:  ? 03/11/22 1719 03/12/22 0357  ?BP: 110/69 (!) 113/58  ?Pulse: 91 81  ?Resp: 19 16  ?Temp: 98.6 ?F (37 ?C) (!) 97.5 ?F (36.4 ?C)  ?SpO2: 97% 99%  ? Skin clean, dry and intact without evidence of skin break down, no evidence of skin tears noted. ?IV catheter discontinued intact. Site without signs and symptoms of complications - no redness or edema noted at insertion site, patient denies c/o pain - only slight tenderness at site.  Dressing with slight pressure applied. ? ?D/c Instructions-Education: ?Discharge instructions given to patient/family with verbalized understanding. ?D/c education completed with patient/family including follow up instructions, medication list, d/c activities limitations if indicated, with other d/c instructions as indicated by MD - patient able to verbalize understanding, all questions fully answered. ?Patient instructed to return to ED, call 911, or call MD for any changes in condition.  ?Patient escorted via [place], and D/C home via private auto. ? ?Kathie Rhodes, RN ?03/12/2022 12:43 PM  ?

## 2022-03-31 DEATH — deceased

## 2022-04-07 ENCOUNTER — Other Ambulatory Visit (HOSPITAL_COMMUNITY): Payer: Self-pay

## 2022-04-11 LAB — ACID FAST CULTURE WITH REFLEXED SENSITIVITIES (MYCOBACTERIA): Acid Fast Culture: NEGATIVE
